# Patient Record
Sex: Female | Born: 1945 | Race: Black or African American | Hispanic: No | Smoking: Never smoker
Health system: Southern US, Community
[De-identification: ages and names within clinical notes are randomized; demographics above are authoritative.]

## PROBLEM LIST (undated history)

## (undated) ENCOUNTER — Emergency Department (HOSPITAL_COMMUNITY): Payer: Medicare Other | Source: Home / Self Care

## (undated) DIAGNOSIS — E213 Hyperparathyroidism, unspecified: Secondary | ICD-10-CM

## (undated) DIAGNOSIS — N390 Urinary tract infection, site not specified: Secondary | ICD-10-CM

## (undated) DIAGNOSIS — F0390 Unspecified dementia without behavioral disturbance: Secondary | ICD-10-CM

## (undated) DIAGNOSIS — E785 Hyperlipidemia, unspecified: Secondary | ICD-10-CM

## (undated) DIAGNOSIS — E119 Type 2 diabetes mellitus without complications: Secondary | ICD-10-CM

## (undated) DIAGNOSIS — R410 Disorientation, unspecified: Secondary | ICD-10-CM

## (undated) DIAGNOSIS — I5032 Chronic diastolic (congestive) heart failure: Secondary | ICD-10-CM

## (undated) DIAGNOSIS — R531 Weakness: Secondary | ICD-10-CM

## (undated) DIAGNOSIS — E440 Moderate protein-calorie malnutrition: Secondary | ICD-10-CM

## (undated) DIAGNOSIS — I10 Essential (primary) hypertension: Secondary | ICD-10-CM

## (undated) DIAGNOSIS — Z741 Need for assistance with personal care: Secondary | ICD-10-CM

## (undated) DIAGNOSIS — I5020 Unspecified systolic (congestive) heart failure: Secondary | ICD-10-CM

## (undated) DIAGNOSIS — G629 Polyneuropathy, unspecified: Secondary | ICD-10-CM

## (undated) HISTORY — DX: Unspecified systolic (congestive) heart failure: I50.20

## (undated) HISTORY — DX: Hypercalcemia: E83.52

## (undated) HISTORY — DX: Hyperlipidemia, unspecified: E78.5

## (undated) HISTORY — DX: Chronic diastolic (congestive) heart failure: I50.32

## (undated) HISTORY — PX: OTHER SURGICAL HISTORY: SHX169

## (undated) HISTORY — DX: Hyperparathyroidism, unspecified: E21.3

## (undated) NOTE — *Deleted (*Deleted)
PROGRESS NOTE    Maria Parrish  LZJ:[id number] DOB: 1946-06-23 DOA: 05/12/2020 PCP: Vidal Schwalbe, MD   Brief Narrative: Maria Parrish is a 18 y.o.  female with medical history significant of difficulty ambulating, history of generalized weakness, confusion, history of dementia, type 2 diabetes, history of hypercalcemia, hyperparathyroidism, hypertension, hyperlipidemia, peripheral neuropathy. Patient presented secondary to a fall and found to have concern for a heart failure exacerbation. Given lasix. Transthoracic Echocardiogram ordered and pending.   Assessment & Plan:   Principal Problem:   Acute on chronic diastolic congestive heart failure (HCC) Active Problems:   Uncontrolled type 2 diabetes mellitus (HCC)   Hypokalemia   Essential hypertension, benign   Mixed hyperlipidemia   Dementia (HCC)   Left hemiparesis (HCC)   Elevated serum creatinine   Acute on chronic diastolic (congestive) heart failure (HCC)   Acute on chronic diastolic heart failure Acute systolic heart failure Last Transthoracic Echocardiogram from 2017. Significantly elevated BNP. Given Lasix 20 mg IV x1. Currently on room air with no current concerns about dyspnea. Transthoracic Echocardiogram significant for reduced EF of 40-45% and grade 2 diastolic dysfunction. Weight is up 2.6 kg from admission. Inaccurate in/out record -Cardiology recommendations: increased to Lasix 40 mg IV BID, recommending nephrology consult -Daily weights (standing) and strict in/out  AKI on CKD stage IIIb Assumed AKI. Last creatinine from 10 months ago was around 1.5. Creatinine of 2.62 on admission presumably in setting of fluid overload. Repeat BMP probably erroneously indicated a creatinine bump up to 8.64 but subsequent creatinine was 2.89. Creatinine trended down slightly but stabilized. Patient appears hypervolemic. Per son, patient's PCP was referring patient to nephrologist -Diuresis as above -Verify patient's baseline  creatinine with PCP (EHR is down today, 11/1)   Diabetes mellitus, type 2 Patient is on Tresiba 22 units daily as an outpatient. Metformin listed but apparently no longer taking.  Patient was not given Lantus on 10/28 for unknown reason -Continue Lantus 22 units daily and SSI  Essential hypertension Uncontrolled -Continue amlodipine, Imdur, hydralazine and metoprolol  Hyperlipidemia -Continue Lipitor and Zetia  Anemia Unknown chronicity. Stable while inpatient. Iron panel unremarkable. -CBC   Neuropathy -Continue gabapentin  Hypokalemia In setting of diuresis. Magnesium repleted. Resolved with repletion.  Hyperparathyroidism Noted on chart. Calcium normal.  Dementia Not fully oriented but appears to be stable.   DVT prophylaxis: Lovenox Code Status:   Code Status: Full Code Family Communication: None at bedside. Disposition Plan: Discharge to home in 1-2 days pending continued diuresis, improvement of weight and cardiology recommendations   Consultants:   Cardiology  Nephrology  Procedures:   TRANSTHORACIC ECHOCARDIOGRAM (10/28) IMPRESSIONS    1. Left ventricular ejection fraction, by estimation, is 40 to 45%. The  left ventricle has mildly decreased function. The left ventricle  demonstrates global hypokinesis. The left ventricular internal cavity size  was mildly dilated. There is mild left  ventricular hypertrophy. Left ventricular diastolic parameters are  consistent with Grade II diastolic dysfunction (pseudonormalization).  Elevated left atrial pressure.  2. Right ventricular systolic function is mildly reduced. The right  ventricular size is normal. There is moderately elevated pulmonary artery  systolic pressure. The estimated right ventricular systolic pressure is  02.4 mmHg.  3. The mitral valve is grossly normal. Mild to moderate mitral valve  regurgitation.  4. The aortic valve is tricuspid. Aortic valve regurgitation is not  visualized.  Mild aortic valve sclerosis is present, with no evidence of  aortic valve stenosis.  5. The inferior vena  cava is dilated in size with >50% respiratory  variability, suggesting right atrial pressure of 8 mmHg.   Antimicrobials:  None    Subjective: No dyspnea or chest pain.  Objective: Vitals:   05/17/20 1600 05/17/20 2206 05/18/20 0445 05/18/20 0600  BP: (!) 142/76 (!) 153/72 (!) 140/56   Pulse: 60 64 66   Resp: 20 18 16    Temp: 98.4 F (36.9 C) 97.8 F (36.6 C) 98.6 F (37 C)   TempSrc:  Oral    SpO2: 100% 99% 100%   Weight:    71.4 kg  Height:        Intake/Output Summary (Last 24 hours) at 05/18/2020 0907 Last data filed at 05/18/2020 0438 Gross per 24 hour  Intake 840 ml  Output 900 ml  Net -60 ml   Filed Weights   05/16/20 1459 05/17/20 0515 05/18/20 0600  Weight: 71.8 kg 72.6 kg 71.4 kg    Examination:  General exam: Appears calm and comfortable Respiratory system: Clear to auscultation. Respiratory effort normal. Cardiovascular system: S1 & S2 heard, RRR. No murmurs, rubs, gallops or clicks. Gastrointestinal system: Abdomen is nondistended, soft and nontender. No organomegaly or masses felt. Normal bowel sounds heard. Central nervous system: Alert and oriented to person and place. No focal neurological deficits. Musculoskeletal: 2+ BLE edema. No calf tenderness Skin: No cyanosis. No rashes Psychiatry: Judgement and insight appear normal. Mood & affect appropriate.    Data Reviewed: I have personally reviewed following labs and imaging studies  CBC Lab Results  Component Value Date   WBC 7.4 05/15/2020   RBC 3.13 (L) 05/15/2020   HGB 9.3 (L) 05/15/2020   HCT 29.0 (L) 05/15/2020   MCV 92.7 05/15/2020   MCH 29.7 05/15/2020   PLT 364 05/15/2020   MCHC 32.1 05/15/2020   RDW 16.1 (H) 05/15/2020   LYMPHSABS 2.0 05/01/2017   MONOABS 0.5 05/01/2017   EOSABS 0.3 05/01/2017   BASOSABS 0.0 [id number]     Last metabolic panel Lab Results   Component Value Date   NA 138 05/18/2020   K 3.8 05/18/2020   CL 106 05/18/2020   CO2 23 05/18/2020   BUN 49 (H) 05/18/2020   CREATININE 3.05 (H) 05/18/2020   GLUCOSE 89 05/18/2020   GFRNONAA 16 (L) 05/18/2020   GFRAA 40 (L) 07/16/2019   CALCIUM 9.2 05/18/2020   PHOS 3.2 05/21/2017   PROT 7.4 05/13/2020   ALBUMIN 3.2 (L) 05/13/2020   BILITOT 0.9 05/13/2020   ALKPHOS 71 05/13/2020   AST 34 05/13/2020   ALT 50 (H) 05/13/2020   ANIONGAP 9 05/18/2020    CBG (last 3)  Recent Labs    05/17/20 1651 05/17/20 2058 05/18/20 0746  GLUCAP 112* 120* 95     GFR: Estimated Creatinine Clearance: 15.9 mL/min (A) (by C-G formula based on SCr of 3.05 mg/dL (H)).  Coagulation Profile: No results for input(s): INR, PROTIME in the last 168 hours.  Recent Results (from the past 240 hour(s))  Respiratory Panel by RT PCR (Flu A&B, Covid) - Nasopharyngeal Swab     Status: None   Collection Time: 05/12/20  7:33 PM   Specimen: Nasopharyngeal Swab  Result Value Ref Range Status   SARS Coronavirus 2 by RT PCR NEGATIVE NEGATIVE Final    Comment: (NOTE) SARS-CoV-2 target nucleic acids are NOT DETECTED.  The SARS-CoV-2 RNA is generally detectable in upper respiratoy specimens during the acute phase of infection. The lowest concentration of SARS-CoV-2 viral copies this assay can detect is 131  copies/mL. A negative result does not preclude SARS-Cov-2 infection and should not be used as the sole basis for treatment or other patient management decisions. A negative result may occur with  improper specimen collection/handling, submission of specimen other than nasopharyngeal swab, presence of viral mutation(s) within the areas targeted by this assay, and inadequate number of viral copies (<131 copies/mL). A negative result must be combined with clinical observations, patient history, and epidemiological information. The expected result is Negative.  Fact Sheet for Patients:   PinkCheek.be  Fact Sheet for Healthcare Providers:  GravelBags.it  This test is no t yet approved or cleared by the [place] FDA and  has been authorized for detection and/or diagnosis of SARS-CoV-2 by FDA under an Emergency Use Authorization (EUA). This EUA will remain  in effect (meaning this test can be used) for the duration of the COVID-19 declaration under Section 564(b)(1) of the Act, 21 U.S.C. section 360bbb-3(b)(1), unless the authorization is terminated or revoked sooner.     Influenza A by PCR NEGATIVE NEGATIVE Final   Influenza B by PCR NEGATIVE NEGATIVE Final    Comment: (NOTE) The Xpert Xpress SARS-CoV-2/FLU/RSV assay is intended as an aid in  the diagnosis of influenza from Nasopharyngeal swab specimens and  should not be used as a sole basis for treatment. Nasal washings and  aspirates are unacceptable for Xpert Xpress SARS-CoV-2/FLU/RSV  testing.  Fact Sheet for Patients: PinkCheek.be  Fact Sheet for Healthcare Providers: GravelBags.it  This test is not yet approved or cleared by the [place] FDA and  has been authorized for detection and/or diagnosis of SARS-CoV-2 by  FDA under an Emergency Use Authorization (EUA). This EUA will remain  in effect (meaning this test can be used) for the duration of the  Covid-19 declaration under Section 564(b)(1) of the Act, 21  U.S.C. section 360bbb-3(b)(1), unless the authorization is  terminated or revoked. Performed at [facility], [address], [place], [place] [address]   Urine culture     Status: Abnormal   Collection Time: 05/12/20  7:57 PM   Specimen: Urine, Random  Result Value Ref Range Status   Specimen Description   Final    URINE, RANDOM Performed at [facility], [address], [place], [place] [address]    Special Requests   Final    NONE Performed at [facility], [address], [place], [place] [address]    Culture MULTIPLE SPECIES PRESENT, SUGGEST RECOLLECTION (A)  Final   Report Status 05/14/2020 FINAL  Final        Radiology Studies: US RENAL  Result Date: 05/17/2020 CLINICAL DATA:  34 year old female with stage 4 chronic kidney disease. EXAM: RENAL / URINARY TRACT ULTRASOUND COMPLETE COMPARISON:  Renal ultrasound 07/21/2015. FINDINGS: Right Kidney: Renal measurements: 10.2 x 4.8 x 5.4 cm = volume: 138 mL. Preserved cortical echogenicity and cortical thickness (image 4). No right hydronephrosis or renal lesion. Left Kidney: Renal measurements: 9.3 x 4.7 x 5.3 cm = volume: 121 mL. Less well visualized than the right kidney but cortical echogenicity and thickness appear preserved on image 24. No left hydronephrosis or renal lesion. Bladder: Appears normal for degree of bladder distention. Both ureteral jets detected with Doppler. No urinary debris is evident. Other: None. IMPRESSION: Negative ultrasound appearance of both kidneys and the urinary bladder. Electronically Signed   By: Genevie Ann M.D.   On: 05/17/2020 13:56        Scheduled Meds: . amLODipine  5 mg Oral Daily  .  aspirin EC  81 mg Oral Daily  . atorvastatin  40 mg Oral Daily  . enoxaparin (LOVENOX) injection  30 mg Subcutaneous Q24H  . ezetimibe  10 mg Oral Daily  . ferrous sulfate  325 mg Oral BID  . furosemide  40 mg Intravenous Daily  . gabapentin  300 mg Oral BID  . hydrALAZINE  25 mg Oral Q6H  . insulin aspart  0-9 Units Subcutaneous TID WC  . insulin glargine  22 Units Subcutaneous Daily  . isosorbide mononitrate  30 mg Oral Daily  . metoprolol succinate  25 mg Oral Daily   Continuous Infusions:   LOS: 5 days     Cordelia Poche, MD [facility] 05/18/2020, 9:07 AM  If 7PM-7AM, please contact night-coverage [email or web address]

---

## 2009-03-27 ENCOUNTER — Emergency Department (HOSPITAL_COMMUNITY): Admission: EM | Admit: 2009-03-27 | Discharge: 2009-03-27 | Payer: Self-pay | Admitting: Emergency Medicine

## 2012-02-29 ENCOUNTER — Other Ambulatory Visit (HOSPITAL_COMMUNITY): Payer: Self-pay | Admitting: *Deleted

## 2012-02-29 ENCOUNTER — Other Ambulatory Visit (HOSPITAL_COMMUNITY): Payer: Self-pay | Admitting: Family Medicine

## 2012-02-29 DIAGNOSIS — Z139 Encounter for screening, unspecified: Secondary | ICD-10-CM

## 2012-03-04 ENCOUNTER — Ambulatory Visit (HOSPITAL_COMMUNITY)
Admission: RE | Admit: 2012-03-04 | Discharge: 2012-03-04 | Disposition: A | Discharge status: Home / Self Care | Payer: Medicare Other | Source: Ambulatory Visit | Attending: Family Medicine | Admitting: Family Medicine

## 2012-03-04 DIAGNOSIS — Z139 Encounter for screening, unspecified: Secondary | ICD-10-CM

## 2012-03-04 DIAGNOSIS — Z1231 Encounter for screening mammogram for malignant neoplasm of breast: Secondary | ICD-10-CM | POA: Insufficient documentation

## 2012-03-05 ENCOUNTER — Ambulatory Visit (HOSPITAL_COMMUNITY): Payer: Self-pay

## 2015-03-25 ENCOUNTER — Emergency Department (HOSPITAL_COMMUNITY): Payer: Medicare HMO

## 2015-03-25 ENCOUNTER — Inpatient Hospital Stay (HOSPITAL_COMMUNITY)
Admission: EM | Admit: 2015-03-25 | Discharge: 2015-03-27 | DRG: 690 | Disposition: A | Discharge status: Home / Self Care | Payer: Medicare HMO | Attending: Internal Medicine | Admitting: Internal Medicine

## 2015-03-25 ENCOUNTER — Encounter (HOSPITAL_COMMUNITY): Payer: Self-pay | Admitting: Emergency Medicine

## 2015-03-25 DIAGNOSIS — E785 Hyperlipidemia, unspecified: Secondary | ICD-10-CM | POA: Diagnosis present

## 2015-03-25 DIAGNOSIS — IMO0002 Reserved for concepts with insufficient information to code with codable children: Secondary | ICD-10-CM | POA: Diagnosis present

## 2015-03-25 DIAGNOSIS — G629 Polyneuropathy, unspecified: Secondary | ICD-10-CM

## 2015-03-25 DIAGNOSIS — Z6821 Body mass index (BMI) 21.0-21.9, adult: Secondary | ICD-10-CM

## 2015-03-25 DIAGNOSIS — I10 Essential (primary) hypertension: Secondary | ICD-10-CM | POA: Diagnosis present

## 2015-03-25 DIAGNOSIS — Z833 Family history of diabetes mellitus: Secondary | ICD-10-CM

## 2015-03-25 DIAGNOSIS — E876 Hypokalemia: Secondary | ICD-10-CM | POA: Diagnosis present

## 2015-03-25 DIAGNOSIS — N390 Urinary tract infection, site not specified: Principal | ICD-10-CM | POA: Diagnosis present

## 2015-03-25 DIAGNOSIS — E1142 Type 2 diabetes mellitus with diabetic polyneuropathy: Secondary | ICD-10-CM | POA: Diagnosis present

## 2015-03-25 DIAGNOSIS — R410 Disorientation, unspecified: Secondary | ICD-10-CM | POA: Diagnosis present

## 2015-03-25 DIAGNOSIS — R4182 Altered mental status, unspecified: Secondary | ICD-10-CM | POA: Insufficient documentation

## 2015-03-25 DIAGNOSIS — E860 Dehydration: Secondary | ICD-10-CM | POA: Diagnosis present

## 2015-03-25 DIAGNOSIS — E440 Moderate protein-calorie malnutrition: Secondary | ICD-10-CM | POA: Insufficient documentation

## 2015-03-25 DIAGNOSIS — E1165 Type 2 diabetes mellitus with hyperglycemia: Secondary | ICD-10-CM | POA: Diagnosis present

## 2015-03-25 DIAGNOSIS — E782 Mixed hyperlipidemia: Secondary | ICD-10-CM | POA: Diagnosis present

## 2015-03-25 HISTORY — DX: Type 2 diabetes mellitus without complications: E11.9

## 2015-03-25 HISTORY — DX: Essential (primary) hypertension: I10

## 2015-03-25 LAB — CBC
HEMATOCRIT: 35.7 % — AB (ref 36.0–46.0)
HEMOGLOBIN: 12.6 g/dL (ref 12.0–15.0)
MCH: 29.4 pg (ref 26.0–34.0)
MCHC: 35.3 g/dL (ref 30.0–36.0)
MCV: 83.4 fL (ref 78.0–100.0)
Platelets: 333 10*3/uL (ref 150–400)
RBC: 4.28 MIL/uL (ref 3.87–5.11)
RDW: 12.7 % (ref 11.5–15.5)
WBC: 8.3 10*3/uL (ref 4.0–10.5)

## 2015-03-25 LAB — COMPREHENSIVE METABOLIC PANEL
ALBUMIN: 4 g/dL (ref 3.5–5.0)
ALT: 13 U/L — ABNORMAL LOW (ref 14–54)
ANION GAP: 9 (ref 5–15)
AST: 19 U/L (ref 15–41)
Alkaline Phosphatase: 61 U/L (ref 38–126)
BUN: 17 mg/dL (ref 6–20)
CALCIUM: 10.2 mg/dL (ref 8.9–10.3)
CO2: 25 mmol/L (ref 22–32)
Chloride: 103 mmol/L (ref 101–111)
Creatinine, Ser: 0.82 mg/dL (ref 0.44–1.00)
GFR calc non Af Amer: >60 mL/min (ref >60)
GLUCOSE: 294 mg/dL — AB (ref 65–99)
POTASSIUM: 3.4 mmol/L — AB (ref 3.5–5.1)
SODIUM: 137 mmol/L (ref 135–145)
Total Bilirubin: 0.6 mg/dL (ref 0.3–1.2)
Total Protein: 8.2 g/dL — ABNORMAL HIGH (ref 6.5–8.1)

## 2015-03-25 LAB — URINE MICROSCOPIC-ADD ON

## 2015-03-25 LAB — URINALYSIS, ROUTINE W REFLEX MICROSCOPIC
BILIRUBIN URINE: NEGATIVE
Glucose, UA: >1000 mg/dL — AB
KETONES UR: 15 mg/dL — AB
LEUKOCYTES UA: NEGATIVE
NITRITE: NEGATIVE
PH: 6 (ref 5.0–8.0)
Protein, ur: 30 mg/dL — AB
Specific Gravity, Urine: 1.025 (ref 1.005–1.030)
UROBILINOGEN UA: 2 mg/dL — AB (ref 0.0–1.0)

## 2015-03-25 LAB — ETHANOL

## 2015-03-25 LAB — CBG MONITORING, ED: GLUCOSE-CAPILLARY: 277 mg/dL — AB (ref 65–99)

## 2015-03-25 MED ORDER — CIPROFLOXACIN IN D5W 400 MG/200ML IV SOLN
400.0000 mg | Freq: Once | INTRAVENOUS | Status: AC
Start: 2015-03-25 — End: 2015-03-26
  Administered 2015-03-25: 400 mg via INTRAVENOUS
  Filled 2015-03-25: qty 200

## 2015-03-25 NOTE — ED Provider Notes (Signed)
CSN: [id number]     Arrival date & time 03/25/15  2002 History   First MD Initiated Contact with Patient 03/25/15 2020     Chief Complaint  Patient presents with  . Leg Pain  . Altered Mental Status    Low 5 caveat due to altered mental status. (Consider location/radiation/quality/duration/timing/severity/associated sxs/prior Treatment) Patient is a 69 y.o. female presenting with leg pain and altered mental status. The history is provided by the patient.  Leg Pain Altered Mental Status  patient has had confusion over the last few months. Has been confused about where she has been. Has been having difficulty driving. Has gotten to wear underwear today. Patient told me she had not seen for it but with further question from the nurse she saw her primary care doctor earlier today for the same thing. Has had some urinary frequency. Has had a unexplained weight loss also over the last several months. No cough. Dull headache at times. No blurred vision. Patient son state that she has been at a store and has to ask them where she was.  Past Medical History  Diagnosis Date  . Diabetes mellitus without complication   . Hypertension    History reviewed. No pertinent past surgical history. History reviewed. No pertinent family history. Social History  Substance Use Topics  . Smoking status: Never Smoker   . Smokeless tobacco: None  . Alcohol Use: No   OB History    No data available     Review of Systems  Unable to perform ROS Constitutional: Positive for unexpected weight change. Negative for chills.  Respiratory: Negative for shortness of breath.   Cardiovascular: Negative for chest pain.      Allergies  Penicillins  Home Medications   Prior to Admission medications   Not on File   BP 146/78 mmHg  Pulse 88  Temp(Src) 97.9 F (36.6 C) (Oral)  Resp 18  Ht 5\' 3"  (1.6 m)  Wt 123 lb (55.792 kg)  BMI 21.79 kg/m2  SpO2 98% Physical Exam  Constitutional: She appears  well-developed.  HENT:  Head: Atraumatic.  Eyes: EOM are normal. Pupils are equal, round, and reactive to light.  Neck: Neck supple.  Cardiovascular: Normal rate and regular rhythm.   Pulmonary/Chest: Effort normal.  Abdominal: Soft. There is no tenderness.  Neurological: She is alert.  Awaken overall appropriate, but mild confusion.  Skin: Skin is warm.  Psychiatric: She has a normal mood and affect.    ED Course  Procedures (including critical care time) Labs Review Labs Reviewed  COMPREHENSIVE METABOLIC PANEL - Abnormal; Notable for the following:    Potassium 3.4 (*)    Glucose, Bld 294 (*)    Total Protein 8.2 (*)    ALT 13 (*)    All other components within normal limits  CBC - Abnormal; Notable for the following:    HCT 35.7 (*)    All other components within normal limits  CBG MONITORING, ED - Abnormal; Notable for the following:    Glucose-Capillary 277 (*)    All other components within normal limits  ETHANOL  URINALYSIS, ROUTINE W REFLEX MICROSCOPIC (NOT AT [facility])    Imaging Review Dg Chest 2 View  03/25/2015   CLINICAL DATA:  Confusion and dizziness.  Hypertension.  EXAM: CHEST  2 VIEW  COMPARISON:  None.  FINDINGS: Lungs are clear. Heart size and pulmonary vascularity are normal. No adenopathy. No bone lesions. There are small cervical ribs bilaterally.  IMPRESSION: No edema or consolidation.  Electronically Signed   By: Lowella Grip III M.D.   On: 03/25/2015 21:21   Ct Head Wo Contrast  03/25/2015   CLINICAL DATA:  Confusion and weakness  EXAM: CT HEAD WITHOUT CONTRAST  TECHNIQUE: Contiguous axial images were obtained from the base of the skull through the vertex without intravenous contrast.  COMPARISON:  03/27/2009  FINDINGS: Moderate age-related atrophy. Mild low attenuation in the deep white matter. Ventricles are proportionate. Atrophy is progressive when compared to the prior study.  No evidence of vascular territory infarct or mass. No hemorrhage or  extra-axial fluid. Calvarium intact.  IMPRESSION: Progressive age-related involutional change.   Electronically Signed   By: Skipper Cliche M.D.   On: 03/25/2015 21:19   I have personally reviewed and evaluated these images and lab results as part of my medical decision-making.   EKG Interpretation   Date/Time:  Thursday March 25 2015 20:48:03 EDT Ventricular Rate:  76 PR Interval:  110 QRS Duration: 77 QT Interval:  391 QTC Calculation: 440 R Axis:   -5 Text Interpretation:  Sinus rhythm Atrial premature complexes Borderline  short PR interval Abnormal R-wave progression, early transition  Nonspecific T abnormalities, lateral leads Confirmed by Alvino Chapel  MD,  Kasyn Rolph [phone]) on 03/25/2015 8:59:41 PM      MDM   Final diagnoses:  Altered mental status, unspecified altered mental status type    Patient gradually worsening of mental status. Lab work overall reassuring but urinalysis pending. Maybe progressive dementia. If urinalysis shows infection may require admission otherwise discharge home.    Davonna Belling, MD 03/25/15 2155

## 2015-03-25 NOTE — ED Notes (Signed)
Patient complaining of pain to lower legs x 2 months. States right lower leg has been bothering her more than left. Patient also states, "my thinking gets a little disoriented at times." Patient's son states that patient has been confused for approximately 2-3 weeks.

## 2015-03-25 NOTE — Discharge Instructions (Signed)
Confusion Confusion is the inability to think with your usual speed or clarity. Confusion may come on quickly or slowly over time. How quickly the confusion comes on depends on the cause. Confusion can be due to any number of causes. CAUSES   Concussion, head injury, or head trauma.  Seizures.  Stroke.  Fever.  Brain tumor.  Age related decreased brain function (dementia).  Heightened emotional states like rage or terror.  Mental illness in which the person loses the ability to determine what is real and what is not (hallucinations).  Infections such as a urinary tract infection (UTI).  Toxic effects from alcohol, drugs, or prescription medicines.  Dehydration and an imbalance of salts in the body (electrolytes).  Lack of sleep.  Low blood sugar (diabetes).  Low levels of oxygen from conditions such as chronic lung disorders.  Drug interactions or other medicine side effects.  Nutritional deficiencies, especially niacin, thiamine, vitamin C, or vitamin B.  Sudden drop in body temperature (hypothermia).  Change in routine, such as when traveling or hospitalized. SIGNS AND SYMPTOMS  People often describe their thinking as cloudy or unclear when they are confused. Confusion can also include feeling disoriented. That means you are unaware of where or who you are. You may also not know what the date or time is. If confused, you may also have difficulty paying attention, remembering, and making decisions. Some people also act aggressively when they are confused.  DIAGNOSIS  The medical evaluation of confusion may include:  Blood and urine tests.  X-rays.  Brain and nervous system tests.  Analyzing your brain waves (electroencephalogram or EEG).  Magnetic resonance imaging (MRI) of your head.  Computed tomography (CT) scan of your head.  Mental status tests in which your health care provider may ask many questions. Some of these questions may seem silly or strange,  but they are a very important test to help diagnose and treat confusion. TREATMENT  An admission to the hospital may not be needed, but a person with confusion should not be left alone. Stay with a family member or friend until the confusion clears. Avoid alcohol, pain relievers, or sedative drugs until you have fully recovered. Do not drive until directed by your health care provider. HOME CARE INSTRUCTIONS  What family and friends can do:  To find out if someone is confused, ask the person to state his or her name, age, and the date. If the person is unsure or answers incorrectly, he or she is confused.  Always introduce yourself, no matter how well the person knows you.  Often remind the person of his or her location.  Place a calendar and clock near the confused person.  Help the person with his or her medicines. You may want to use a pill box, an alarm as a reminder, or give the person each dose as prescribed.  Talk about current events and plans for the day.  Try to keep the environment calm, quiet, and peaceful.  Make sure the person keeps follow-up visits with his or her health care provider. PREVENTION  Ways to prevent confusion:  Avoid alcohol.  Eat a balanced diet.  Get enough sleep.  Take medicine only as directed by your health care provider.  Do not become isolated. Spend time with other people and make plans for your days.  Keep careful watch on your blood sugar levels if you are diabetic. SEEK IMMEDIATE MEDICAL CARE IF:   You develop severe headaches, repeated vomiting, seizures, blackouts, or   slurred speech.  There is increasing confusion, weakness, numbness, restlessness, or personality changes.  You develop a loss of balance, have marked dizziness, feel uncoordinated, or fall.  You have delusions, hallucinations, or develop severe anxiety.  Your family members think you need to be rechecked. Document Released: 08/10/2004 Document Revised: 11/17/2013  Document Reviewed: 08/08/2013 ExitCare Patient Information 2015 [employer]. This information is not intended to replace advice given to you by your health care provider. Make sure you discuss any questions you have with your health care provider.  

## 2015-03-25 NOTE — ED Provider Notes (Signed)
D/w Dr. Conley Canal who will admit UTI with altered MS  Meds given in ED:  Medications  ciprofloxacin (CIPRO) IVPB 400 mg (400 mg Intravenous New Bag/Given 03/25/15 2320)      Noemi Chapel, MD 03/25/15 2326

## 2015-03-26 ENCOUNTER — Encounter (HOSPITAL_COMMUNITY): Payer: Self-pay | Admitting: *Deleted

## 2015-03-26 DIAGNOSIS — Z6821 Body mass index (BMI) 21.0-21.9, adult: Secondary | ICD-10-CM | POA: Diagnosis not present

## 2015-03-26 DIAGNOSIS — R410 Disorientation, unspecified: Secondary | ICD-10-CM | POA: Diagnosis not present

## 2015-03-26 DIAGNOSIS — G629 Polyneuropathy, unspecified: Secondary | ICD-10-CM

## 2015-03-26 DIAGNOSIS — E1165 Type 2 diabetes mellitus with hyperglycemia: Secondary | ICD-10-CM | POA: Diagnosis not present

## 2015-03-26 DIAGNOSIS — N390 Urinary tract infection, site not specified: Secondary | ICD-10-CM | POA: Diagnosis not present

## 2015-03-26 DIAGNOSIS — E876 Hypokalemia: Secondary | ICD-10-CM | POA: Diagnosis present

## 2015-03-26 DIAGNOSIS — R4182 Altered mental status, unspecified: Secondary | ICD-10-CM | POA: Insufficient documentation

## 2015-03-26 DIAGNOSIS — E440 Moderate protein-calorie malnutrition: Secondary | ICD-10-CM | POA: Diagnosis present

## 2015-03-26 DIAGNOSIS — I10 Essential (primary) hypertension: Secondary | ICD-10-CM | POA: Diagnosis present

## 2015-03-26 DIAGNOSIS — E860 Dehydration: Secondary | ICD-10-CM | POA: Diagnosis present

## 2015-03-26 DIAGNOSIS — E785 Hyperlipidemia, unspecified: Secondary | ICD-10-CM | POA: Diagnosis present

## 2015-03-26 DIAGNOSIS — E1142 Type 2 diabetes mellitus with diabetic polyneuropathy: Secondary | ICD-10-CM | POA: Diagnosis present

## 2015-03-26 DIAGNOSIS — Z833 Family history of diabetes mellitus: Secondary | ICD-10-CM | POA: Diagnosis not present

## 2015-03-26 DIAGNOSIS — E782 Mixed hyperlipidemia: Secondary | ICD-10-CM | POA: Diagnosis present

## 2015-03-26 DIAGNOSIS — IMO0002 Reserved for concepts with insufficient information to code with codable children: Secondary | ICD-10-CM | POA: Diagnosis present

## 2015-03-26 LAB — AMMONIA: Ammonia: 14 umol/L (ref 9–35)

## 2015-03-26 LAB — GLUCOSE, CAPILLARY
GLUCOSE-CAPILLARY: 230 mg/dL — AB (ref 65–99)
GLUCOSE-CAPILLARY: 224 mg/dL — AB (ref 65–99)
GLUCOSE-CAPILLARY: 130 mg/dL — AB (ref 65–99)
Glucose-Capillary: 234 mg/dL — ABNORMAL HIGH (ref 65–99)
Glucose-Capillary: 245 mg/dL — ABNORMAL HIGH (ref 65–99)

## 2015-03-26 LAB — VITAMIN B12: VITAMIN B 12: 181 pg/mL (ref 180–914)

## 2015-03-26 LAB — TSH: TSH: 1.613 u[IU]/mL (ref 0.350–4.500)

## 2015-03-26 MED ORDER — LISINOPRIL 10 MG PO TABS
10.0000 mg | ORAL_TABLET | Freq: Every day | ORAL | Status: DC
  Administered 2015-03-26: 10 mg via ORAL
  Filled 2015-03-26: qty 1

## 2015-03-26 MED ORDER — METFORMIN HCL 500 MG PO TABS
500.0000 mg | ORAL_TABLET | Freq: Two times a day (BID) | ORAL | Status: DC
  Administered 2015-03-26 – 2015-03-27 (×2): 500 mg via ORAL
  Filled 2015-03-26 (×3): qty 1

## 2015-03-26 MED ORDER — DEXTROSE 5 % IV SOLN
1.0000 g | INTRAVENOUS | Status: DC
  Administered 2015-03-26 – 2015-03-27 (×2): 1 g via INTRAVENOUS
  Filled 2015-03-26 (×4): qty 10

## 2015-03-26 MED ORDER — INSULIN ASPART 100 UNIT/ML ~~LOC~~ SOLN
0.0000 [IU] | Freq: Every day | SUBCUTANEOUS | Status: DC
  Administered 2015-03-26: 2 [IU] via SUBCUTANEOUS

## 2015-03-26 MED ORDER — POTASSIUM CHLORIDE IN NACL 20-0.9 MEQ/L-% IV SOLN
INTRAVENOUS | Status: DC
  Administered 2015-03-26 – 2015-03-27 (×4): via INTRAVENOUS

## 2015-03-26 MED ORDER — ONDANSETRON HCL 4 MG/2ML IJ SOLN: 4.0000 mg | Freq: Three times a day (TID) | INTRAMUSCULAR | Status: AC | PRN

## 2015-03-26 MED ORDER — ACETAMINOPHEN 325 MG PO TABS: 650.0000 mg | ORAL_TABLET | Freq: Four times a day (QID) | ORAL | Status: DC | PRN

## 2015-03-26 MED ORDER — ATORVASTATIN CALCIUM 10 MG PO TABS
10.0000 mg | ORAL_TABLET | Freq: Every day | ORAL | Status: DC
  Administered 2015-03-26 – 2015-03-27 (×2): 10 mg via ORAL
  Filled 2015-03-26 (×2): qty 1

## 2015-03-26 MED ORDER — ENOXAPARIN SODIUM 40 MG/0.4ML ~~LOC~~ SOLN
40.0000 mg | SUBCUTANEOUS | Status: DC
  Administered 2015-03-26 – 2015-03-27 (×2): 40 mg via SUBCUTANEOUS
  Filled 2015-03-26 (×2): qty 0.4

## 2015-03-26 MED ORDER — ONDANSETRON HCL 4 MG PO TABS: 4.0000 mg | ORAL_TABLET | Freq: Four times a day (QID) | ORAL | Status: DC | PRN

## 2015-03-26 MED ORDER — ACETAMINOPHEN 650 MG RE SUPP: 650.0000 mg | Freq: Four times a day (QID) | RECTAL | Status: DC | PRN

## 2015-03-26 MED ORDER — GLUCERNA SHAKE PO LIQD
237.0000 mL | Freq: Two times a day (BID) | ORAL | Status: DC
  Administered 2015-03-26 – 2015-03-27 (×3): 237 mL via ORAL

## 2015-03-26 MED ORDER — ENSURE ENLIVE PO LIQD: 237.0000 mL | Freq: Two times a day (BID) | ORAL | Status: DC

## 2015-03-26 MED ORDER — INSULIN ASPART 100 UNIT/ML ~~LOC~~ SOLN
0.0000 [IU] | Freq: Three times a day (TID) | SUBCUTANEOUS | Status: DC
  Administered 2015-03-26 – 2015-03-27 (×4): 5 [IU] via SUBCUTANEOUS
  Administered 2015-03-27: 3 [IU] via SUBCUTANEOUS

## 2015-03-26 MED ORDER — INSULIN ASPART 100 UNIT/ML ~~LOC~~ SOLN: 6.0000 [IU] | Freq: Once | SUBCUTANEOUS | Status: DC

## 2015-03-26 MED ORDER — DEXTROSE 5 % IV SOLN
INTRAVENOUS | Status: AC
  Filled 2015-03-26: qty 10

## 2015-03-26 MED ORDER — POTASSIUM CHLORIDE CRYS ER 20 MEQ PO TBCR
40.0000 meq | EXTENDED_RELEASE_TABLET | Freq: Once | ORAL | Status: AC
  Administered 2015-03-26: 40 meq via ORAL
  Filled 2015-03-26: qty 2

## 2015-03-26 MED ORDER — GLIPIZIDE 5 MG PO TABS
5.0000 mg | ORAL_TABLET | Freq: Two times a day (BID) | ORAL | Status: DC
  Administered 2015-03-26 – 2015-03-27 (×2): 5 mg via ORAL
  Filled 2015-03-26 (×2): qty 1

## 2015-03-26 MED ORDER — ONDANSETRON HCL 4 MG/2ML IJ SOLN: 4.0000 mg | Freq: Four times a day (QID) | INTRAMUSCULAR | Status: DC | PRN

## 2015-03-26 MED ORDER — MAGNESIUM HYDROXIDE 400 MG/5ML PO SUSP: 30.0000 mL | Freq: Every day | ORAL | Status: DC | PRN

## 2015-03-26 NOTE — Progress Notes (Signed)
Initial Nutrition Assessment  DOCUMENTATION CODES:  Non-severe (moderate) malnutrition in context of chronic illness  INTERVENTION:  Glucerna Shake po BID, each supplement provides 220 kcal and 10 grams of protein  Offered DM education/referral to outpatient services to better manage DM  NUTRITION DIAGNOSIS:  Unintentional weight loss related to Suspected poor glycemic control as evidenced by pt reporting loss of over 30lbs over the past few months  GOAL:  Patient will meet greater than or equal to 90% of their needs  MONITOR:  PO intake, Supplement acceptance, Labs, Weight trends  REASON FOR ASSESSMENT:  Malnutrition Screening Tool    ASSESSMENT:  69 y.o. Female. PMHX: DM, HLD who presents to ED with confusion for several weeks to several months as well as leg pain. Found to have elevated WBC and BG of 296  Pt has been confused and cannot be sure of accuracy of following information  Pt reports that she has been eating, but has still been losing weight. She does not really eat meals, rather she just snacks throughout the day. She does not follow a DM diet and stated "I really love sweets". She does not take an vitamin/mineral supplements.   Denies any n/v/c/d  She states that she weighed 155 a few months ago. If this is accurate, her wt loss could be related to poor glycemic control. I am unsure of how pt would do with diet education given her confusion. I offered referral to OP resources if she was interested in learning how to control her DM through diet as this could be contributing to her wt loss.   Diet Order:  Diet heart healthy/carb modified Room service appropriate?: Yes; Fluid consistency:: Thin  Skin:  Reviewed, no issues  Last BM:  9/8  Height:  Ht Readings from Last 1 Encounters:  03/26/15 5\' 4"  (1.626 m)   Weight:  Wt Readings from Last 1 Encounters:  03/26/15 127 lb 8 oz (57.834 kg)  Admit weight 123 lbs  Ideal Body Weight:  54.54 kg  BMI:  Body mass  index is 21.87 kg/(m^2).  Estimated Nutritional Needs:  Kcal:  1500-1650 kcals (27-30 kcal/kg) Protein:  56-67 (1-1.2 g/kg) Fluid:  1.5-1.7 liters  EDUCATION NEEDS:  Education needs no appropriate at this time  Burtis Junes RD, LDN Nutrition Pager: [phone] 03/26/2015 1:56 PM

## 2015-03-26 NOTE — Progress Notes (Signed)
Patient admitted by Dr. Conley Canal earlier this morning.  Patient seen and examined.   She has been admitted to the hospital with confusion and urinary tract infection. She is on antibiotics. Follow up urine culture. She is feeling better today. Anticipate discharge home in the next 24-48 hours.  Maria Parrish

## 2015-03-26 NOTE — Progress Notes (Signed)
Inpatient Diabetes Program Recommendations  AACE/ADA: New Consensus Statement on Inpatient Glycemic Control (2013)  Target Ranges:  Prepandial:   less than 140 mg/dL      Peak postprandial:   less than 180 mg/dL (1-2 hours)      Critically ill patients:  140 - 180 mg/dL   Results for MLISS, BUSALACCHI (MRN [id number]) as of 03/26/2015 08:56  Ref. Range 03/25/2015 20:47 03/26/2015 01:51 03/26/2015 07:31  Glucose-Capillary Latest Ref Range: 65-99 mg/dL 277 (H) 230 (H) 234 (H)    Diabetes history: DM2 Outpatient Diabetes medications: Metformin 500 mg BID Current orders for Inpatient glycemic control: Metformin 500 mg BID, Novolog 0-15 units TID with meals, Novolog 0-5 units HS  Inpatient Diabetes Program Recommendations Oral Agents: Note in H&P that "with cognitive issues, may not be the best home insulin candidate." Therefore, may want to consider ordering Tradjenta 5 mg daily and increasing Metformin to 1000 mg BID (if appropriate for patient) while inpatient to determine effect on glycemic control.  Thanks, Barnie Alderman, RN, MSN, CCRN, CDE Diabetes Coordinator Inpatient Diabetes Program [phone] (Team Pager from [place] to [place]) [phone] ([place] office) [phone] [facility] office) [phone] [facility] office)

## 2015-03-26 NOTE — H&P (Signed)
[facility] History and Physical  Maria Parrish [id number] DOB: 1946-05-08 DOA: 03/25/2015  Referring physician: Sabra Parrish PCP: Maria Regal, MD   Chief Complaint:   HPI: Maria Parrish is a 69 y.o. female  With history of hypertension, diabetes and hyperlipidemia who presents to the emergency room with confusion for several weeks to several months as well as leg pain. She describes the pain as "pins and needles" in both legs from the knees down. Reports that sometimes when she is driving, she becomes disoriented. She also reports that she occasionally stumbles. She was brought by son who is currently unavailable. She lives with her son. In the emergency room, workup significant for a blood glucose of 296 and many bacteria on urinalysis with 11-20 white cells. When asked, she does endorse some urinary frequency but denies significant fevers chills night sweats suprapubic or flank pain. Denies vomiting or diarrhea. No change in medications or appetite.`   Review of Systems:  Constitutional:  No weight loss, night sweats, Fevers, chills, fatigue.  HEENT:  No headaches, Difficulty swallowing,Tooth/dental problems,Sore throat,  No sneezing, itching, ear ache, nasal congestion, post nasal drip,  Cardio-vascular:  No chest pain, Orthopnea, PND, swelling in lower extremities, anasarca, dizziness, palpitations  GI:  No heartburn, indigestion, abdominal pain, nausea, vomiting, diarrhea, change in bowel habits, loss of appetite  Resp:  No shortness of breath with exertion or at rest. No excess mucus, no productive cough, No non-productive cough, No coughing up of blood.No change in color of mucus.No wheezing.No chest wall deformity  Skin:  no rash or lesions.  GU:  no dysuria, change in color of urine, no urgency or frequency. No flank pain.  Musculoskeletal:  No joint pain or swelling. No decreased range of motion. No back pain.  Psych:  No change in mood or affect. No depression or  anxiety. No memory loss.   Past Medical History  Diagnosis Date  . Diabetes mellitus without complication   . Hypertension    History reviewed. No pertinent past surgical history. Social History:  reports that she has never smoked. She does not have any smokeless tobacco history on file. She reports that she does not drink alcohol or use illicit drugs.  Allergies  Allergen Reactions  . Penicillins Hives   FH: diabetes  Prior to Admission medications   Medication Sig Start Date End Date Taking? Authorizing Provider  atorvastatin (LIPITOR) 10 MG tablet Take 10 mg by mouth daily. 01/19/15  Yes Historical Provider, MD  lisinopril (PRINIVIL,ZESTRIL) 10 MG tablet Take 10 mg by mouth daily. 01/13/15  Yes Historical Provider, MD  metFORMIN (GLUCOPHAGE) 500 MG tablet Take 500 mg by mouth 2 (two) times daily. 01/13/15  Yes Historical Provider, MD   Physical Exam: Filed Vitals:   03/25/15 2013 03/25/15 2230 03/26/15 0000  BP: 146/78 147/112 181/77  Pulse: 88 72   Temp: 97.9 F (36.6 C)    TempSrc: Oral    Resp: 18 15   Height: 5\' 3"  (1.6 m)    Weight: 55.792 kg (123 lb)    SpO2: 98% 99%     Wt Readings from Last 3 Encounters:  03/25/15 55.792 kg (123 lb)    BP 181/77 mmHg  Pulse 72  Temp(Src) 97.9 F (36.6 C) (Oral)  Resp 15  Ht 5\' 3"  (1.6 m)  Wt 55.792 kg (123 lb)  BMI 21.79 kg/m2  SpO2 99%  General Appearance:    Alert, cooperative, no distress, appears stated age. Oriented.  Head:  Normocephalic, without obvious abnormality, atraumatic  Eyes:    PERRL, conjunctiva/corneas clear, EOM's intact,  Nose:   Nares normal, septum midline, mucosa normal, no drainage    or sinus tenderness  Throat:   Lips, mucosa, and tongue normal; teeth and gums normal  Neck:   Supple, symmetrical, trachea midline, no adenopathy;    thyroid:  no enlargement/tenderness/nodules; no carotid   bruit or JVD  Back:     Symmetric, no curvature, ROM normal, no CVA tenderness  Lungs:     Clear to  auscultation bilaterally, respirations unlabored  Chest Wall:    No tenderness or deformity   Heart:    Regular rate and rhythm, S1 and S2 normal, no murmur, rub   or gallop  Abdomen:     Soft, non-tender, bowel sounds active all four quadrants,    no masses, no organomegaly  Genitalia:    deferred  Rectal:    deferred  Extremities:   Extremities normal, atraumatic, no cyanosis or edema  Pulses:   2+ and symmetric all extremities  Skin:   Skin color, texture, turgor normal, no rashes or lesions  Lymph nodes:   Cervical, supraclavicular, and axillary nodes normal  Neurologic:   CNII-XII intact, normal strength, sensation and reflexes    throughout             Psychiatric: Calm cooperative with normal affect  Labs on Admission:  Basic Metabolic Panel:  Recent Labs Lab 03/25/15 2040  NA 137  K 3.4*  CL 103  CO2 25  GLUCOSE 294*  BUN 17  CREATININE 0.82  CALCIUM 10.2   Liver Function Tests:  Recent Labs Lab 03/25/15 2040  AST 19  ALT 13*  ALKPHOS 61  BILITOT 0.6  PROT 8.2*  ALBUMIN 4.0   No results for input(s): LIPASE, AMYLASE in the last 168 hours. No results for input(s): AMMONIA in the last 168 hours. CBC:  Recent Labs Lab 03/25/15 2040  WBC 8.3  HGB 12.6  HCT 35.7*  MCV 83.4  PLT 333   Cardiac Enzymes: No results for input(s): CKTOTAL, CKMB, CKMBINDEX, TROPONINI in the last 168 hours.  BNP (last 3 results) No results for input(s): BNP in the last 8760 hours.  ProBNP (last 3 results) No results for input(s): PROBNP in the last 8760 hours.  CBG:  Recent Labs Lab 03/25/15 2047  GLUCAP 277*    Radiological Exams on Admission: Dg Chest 2 View  03/25/2015   CLINICAL DATA:  Confusion and dizziness.  Hypertension.  EXAM: CHEST  2 VIEW  COMPARISON:  None.  FINDINGS: Lungs are clear. Heart size and pulmonary vascularity are normal. No adenopathy. No bone lesions. There are small cervical ribs bilaterally.  IMPRESSION: No edema or consolidation.    Electronically Signed   By: Lowella Grip III M.D.   On: 03/25/2015 21:21   Ct Head Wo Contrast  03/25/2015   CLINICAL DATA:  Confusion and weakness  EXAM: CT HEAD WITHOUT CONTRAST  TECHNIQUE: Contiguous axial images were obtained from the base of the skull through the vertex without intravenous contrast.  COMPARISON:  03/27/2009  FINDINGS: Moderate age-related atrophy. Mild low attenuation in the deep white matter. Ventricles are proportionate. Atrophy is progressive when compared to the prior study.  No evidence of vascular territory infarct or mass. No hemorrhage or extra-axial fluid. Calvarium intact.  IMPRESSION: Progressive age-related involutional change.   Electronically Signed   By: Skipper Cliche M.D.   On: 03/25/2015 21:19    EKG:  Sinus rhythm Atrial premature complexes Borderline short PR interval Abnormal R-wave progression, early transition Nonspecific T abnormalities, lateral leads  Assessment/Plan Principal Problem:   UTI (lower urinary tract infection): Rocephin, urine culture Active Problems:   DM (diabetes mellitus), type 2, uncontrolled: Will give sliding scale. Check hemoglobin A1c. With cognitive issues, may not be the best home insulin candidate. Can likely be controlled with orals alone I suspect.   Confusion: Sounds somewhat subacute to chronic. May be worsened by UTI. With neuropathy need to rule out B-12 or folate deficiency. will also check ammonia and TSH. If continues, would advise against driving. Also describes some stumbling when she walks. Will get PT evaluation.   Neuropathy: Check B-12, folate, may be diabetes related.   Hypokalemia: Replete by mouth   Dehydration: IV fluids   Essential hypertension: Continue outpatient medications   Hyperlipidemia: Continue outpatient medications  Code Status: Full code Family Communication: Son has left Disposition Plan: Admit to inpatient. Anticipate a 2 to three-day stay.  Time spent: 60 min  [facility] [email or web address] password [facility]

## 2015-03-26 NOTE — Evaluation (Signed)
Physical Therapy Evaluation Patient Details Name: Maria Parrish MRN: [id number] DOB: 02-04-1946 Today's Date: 03/26/2015   History of Present Illness  With history of hypertension, diabetes and hyperlipidemia who presents to the emergency room with confusion for several weeks to several months as well as leg pain. She describes the pain as "pins and needles" in both legs from the knees down. Reports that sometimes when she is driving, she becomes disoriented. She also reports that she occasionally stumbles. She was brought by son who is currently unavailable. She lives with her son. In the emergency room, workup significant for a blood glucose of 296 and many bacteria on urinalysis with 11-20 white cells. When asked, she does endorse some urinary frequency but denies significant fevers chills night sweats suprapubic or flank pain. Denies vomiting or diarrhea. No change in medications or appetite.` Pt is normally independent in the home and does all of the household activities.  Clinical Impression   Pt was seen for evaluation.  She was alert and oriented, very cooperative.  Her strength is WNL with the exception of mild left quadriceps weakness which is evidenced when she descends steps.  She has a mild dysfunction of her sitting balance and dynamic gait balance but is able to walk functional distances with good stability.  She does not need any assistive devices for gait.  It is possible that her peripheral neuropathy is interfering somewhat in her balance.  I have recommended OP PT for her after d/c.    Follow Up Recommendations Outpatient PT    Equipment Recommendations  None recommended by PT    Recommendations for Other Services    none   Precautions / Restrictions Precautions Precautions: None Restrictions Weight Bearing Restrictions: No      Mobility  Bed Mobility Overal bed mobility: Independent                Transfers Overall transfer level: Independent                  Ambulation/Gait Ambulation/Gait assistance: Modified independent (Device/Increase time) Ambulation Distance (Feet): 200 Feet Assistive device: None Gait Pattern/deviations: WFL(Within Functional Limits) Gait velocity: she is able to walk at a normal speed but initially ambulated with a very slow speed Gait velocity interpretation: <1.8 ft/sec, indicative of risk for recurrent falls General Gait Details: initially upon walking, pt had a very short based gait...she stated that she has been walking this way for about 2 weeks (she doesn't know why).Marland KitchenMarland KitchenI asked her to increase her step length which she did and this resulted in a fairly normal and stable gait pattern  Stairs            Wheelchair Mobility    Modified Rankin (Stroke Patients Only)       Balance Overall balance assessment: Needs assistance Sitting-balance support: No upper extremity supported;Feet supported Sitting balance-Leahy Scale: Fair Sitting balance - Comments: tends to fall backward at times                         Standardized Balance Assessment Standardized Balance Assessment : Dynamic Gait Index   Dynamic Gait Index Level Surface: Normal Change in Gait Speed: Normal Gait with Horizontal Head Turns: Normal Gait with Vertical Head Turns: Normal Gait and Pivot Turn: Normal Step Over Obstacle: Mild Impairment Step Around Obstacles: Mild Impairment Steps: Normal Total Score: 22       Pertinent Vitals/Pain Pain Assessment: No/denies pain    Home Living Family/patient  expects to be discharged to:: Private residence Living Arrangements: Children Available Help at Discharge: Family;Available PRN/intermittently Type of Home: House Home Access: Stairs to enter Entrance Stairs-Rails: Right Entrance Stairs-Number of Steps: 3 Home Layout: One level Home Equipment: Cane - single point      Prior Function Level of Independence: Independent         Comments: pt participates in  all household activities     Hand Dominance        Extremity/Trunk Assessment   Upper Extremity Assessment: Overall WFL for tasks assessed           Lower Extremity Assessment: Overall WFL for tasks assessed (mild weakness of the left quadriceps (4-/5))      Cervical / Trunk Assessment: Normal  Communication   Communication: No difficulties  Cognition Arousal/Alertness: Awake/alert Behavior During Therapy: WFL for tasks assessed/performed Overall Cognitive Status: Within Functional Limits for tasks assessed                      General Comments      Exercises        Assessment/Plan    PT Assessment All further PT needs can be met in the next venue of care  PT Diagnosis Abnormality of gait   PT Problem List Decreased balance  PT Treatment Interventions     PT Goals (Current goals can be found in the Care Plan section) Acute Rehab PT Goals PT Goal Formulation: All assessment and education complete, DC therapy    Frequency     Barriers to discharge        Co-evaluation               End of Session Equipment Utilized During Treatment: Gait belt Activity Tolerance: Patient tolerated treatment well Patient left: in chair;with call bell/phone within reach;with chair alarm set           Time: 3729-0211 PT Time Calculation (min) (ACUTE ONLY): 31 min   Charges:   PT Evaluation $Initial PT Evaluation Tier I: 1 Procedure     PT G CodesDemetrios Isaacs L  PT 03/26/2015, 4:12 PM [phone]

## 2015-03-27 DIAGNOSIS — I10 Essential (primary) hypertension: Secondary | ICD-10-CM

## 2015-03-27 LAB — GLUCOSE, CAPILLARY
Glucose-Capillary: 175 mg/dL — ABNORMAL HIGH (ref 65–99)
Glucose-Capillary: 217 mg/dL — ABNORMAL HIGH (ref 65–99)

## 2015-03-27 LAB — CBC
HCT: 31.4 % — ABNORMAL LOW (ref 36.0–46.0)
Hemoglobin: 11.1 g/dL — ABNORMAL LOW (ref 12.0–15.0)
MCH: 29.8 pg (ref 26.0–34.0)
MCHC: 35.4 g/dL (ref 30.0–36.0)
MCV: 84.4 fL (ref 78.0–100.0)
Platelets: 268 K/uL (ref 150–400)
RBC: 3.72 MIL/uL — ABNORMAL LOW (ref 3.87–5.11)
RDW: 12.8 % (ref 11.5–15.5)
WBC: 6.3 K/uL (ref 4.0–10.5)

## 2015-03-27 LAB — BASIC METABOLIC PANEL WITH GFR
Anion gap: 6 (ref 5–15)
BUN: 11 mg/dL (ref 6–20)
CO2: 24 mmol/L (ref 22–32)
Calcium: 9 mg/dL (ref 8.9–10.3)
Chloride: 107 mmol/L (ref 101–111)
Creatinine, Ser: 0.54 mg/dL (ref 0.44–1.00)
GFR calc Af Amer: >60 mL/min
GFR calc non Af Amer: >60 mL/min
Glucose, Bld: 183 mg/dL — ABNORMAL HIGH (ref 65–99)
Potassium: 3.6 mmol/L (ref 3.5–5.1)
Sodium: 137 mmol/L (ref 135–145)

## 2015-03-27 LAB — HEMOGLOBIN A1C
HEMOGLOBIN A1C: 11.6 % — AB (ref 4.8–5.6)
MEAN PLASMA GLUCOSE: 286 mg/dL

## 2015-03-27 MED ORDER — GLIPIZIDE 5 MG PO TABS: 5.0000 mg | ORAL_TABLET | Freq: Two times a day (BID) | ORAL | Status: DC

## 2015-03-27 MED ORDER — LISINOPRIL 10 MG PO TABS
20.0000 mg | ORAL_TABLET | Freq: Every day | ORAL | Status: DC
  Administered 2015-03-27: 20 mg via ORAL
  Filled 2015-03-27: qty 2

## 2015-03-27 MED ORDER — AMLODIPINE BESYLATE 5 MG PO TABS: 5.0000 mg | ORAL_TABLET | Freq: Every day | ORAL | Status: DC

## 2015-03-27 MED ORDER — AMLODIPINE BESYLATE 5 MG PO TABS
5.0000 mg | ORAL_TABLET | Freq: Every day | ORAL | Status: DC
  Administered 2015-03-27: 5 mg via ORAL
  Filled 2015-03-27: qty 1

## 2015-03-27 MED ORDER — CIPROFLOXACIN HCL 500 MG PO TABS: 500.0000 mg | ORAL_TABLET | Freq: Two times a day (BID) | ORAL | Status: DC

## 2015-03-27 MED ORDER — LISINOPRIL 20 MG PO TABS: 20.0000 mg | ORAL_TABLET | Freq: Every day | ORAL | Status: DC

## 2015-03-27 MED ORDER — METFORMIN HCL 500 MG PO TABS: 500.0000 mg | ORAL_TABLET | Freq: Two times a day (BID) | ORAL | Status: DC

## 2015-03-27 NOTE — Discharge Summary (Signed)
Physician Discharge Summary  Maria Parrish [id number] DOB: 02/26/1946 DOA: 03/25/2015  PCP: Shade Flood, MD  Admit date: 03/25/2015 Discharge date: 03/27/2015  Time spent: 35 minutes  Recommendations for Outpatient Follow-up:  1. Follow up with primary care physician in 1-2 weeks to discuss blood sugar management  Discharge Diagnoses:  Principal Problem:   UTI (lower urinary tract infection) Active Problems:   DM (diabetes mellitus), type 2, uncontrolled   Confusion   Neuropathy   Hypokalemia   Dehydration   Essential hypertension   Hyperlipidemia   Malnutrition of moderate degree   Altered mental status   Discharge Condition: improved  Diet recommendation: low salt, low carb  Filed Weights   03/25/15 2013 03/26/15 0100  Weight: 55.792 kg (123 lb) 57.834 kg (127 lb 8 oz)    History of present illness:  This is a 69 year old female who presented to the emergency room with confusion and leg pain. She reported pins and needles in both legs from the knees down. She is found to have a possible UTI. She was admitted for further treatments.  Hospital Course:  Patient was started on Rocephin in the emergency room. She remained afebrile throughout hospital course. Urine cultures growing gram-negative rods. Final culture will need to be followed up. She was empirically transitioned to oral ciprofloxacin to complete her course. Mental status has improved back to baseline and she is no longer confused.  She was noted to have uncontrolled diabetes. She reports that she was previously taking metformin and glipizide but stopped these medications due to concerns for possible side effects that she saw on television. She was advised to continue these medications for now and follow-up with her primary care physician for further management of diabetes. She reports that she does have a glucometer at home and is wearing out the operated. patient was seen by physical therapy and recommendation  for outpatient therapy. She appears to be back to her baseline level of functioning and will be discharged home in stable condition.  Procedures:    Consultations:    Discharge Exam: Filed Vitals:   03/27/15 1020  BP: 164/90  Pulse:   Temp:   Resp:     General: NAD Cardiovascular: S1, S2 RRR Respiratory: CTA B  Discharge Instructions   Discharge Instructions    Diet - low sodium heart healthy    Complete by:  As directed      Increase activity slowly    Complete by:  As directed           Current Discharge Medication List    START taking these medications   Details  amLODipine (NORVASC) 5 MG tablet Take 1 tablet (5 mg total) by mouth daily. Qty: 30 tablet, Refills: 1    ciprofloxacin (CIPRO) 500 MG tablet Take 1 tablet (500 mg total) by mouth 2 (two) times daily. Qty: 8 tablet, Refills: 0    glipiZIDE (GLUCOTROL) 5 MG tablet Take 1 tablet (5 mg total) by mouth 2 (two) times daily before a meal. Qty: 60 tablet, Refills: 1      CONTINUE these medications which have CHANGED   Details  lisinopril (PRINIVIL,ZESTRIL) 20 MG tablet Take 1 tablet (20 mg total) by mouth daily. Qty: 30 tablet, Refills: 1    metFORMIN (GLUCOPHAGE) 500 MG tablet Take 1 tablet (500 mg total) by mouth 2 (two) times daily. Qty: 60 tablet, Refills: 1      CONTINUE these medications which have NOT CHANGED   Details  atorvastatin (LIPITOR)  10 MG tablet Take 10 mg by mouth daily.       Allergies  Allergen Reactions  . Penicillins Hives   Follow-up Information    Follow up with PETERSON, Linden Dolin, MD. Schedule an appointment as soon as possible for a visit in 2 weeks.   Specialty:  Family Medicine   Contact information:   [address] [place] [address] [phone]        The results of significant diagnostics from this hospitalization (including imaging, microbiology, ancillary and laboratory) are listed below for reference.    Significant Diagnostic Studies: Dg  Chest 2 View  03/25/2015   CLINICAL DATA:  Confusion and dizziness.  Hypertension.  EXAM: CHEST  2 VIEW  COMPARISON:  None.  FINDINGS: Lungs are clear. Heart size and pulmonary vascularity are normal. No adenopathy. No bone lesions. There are small cervical ribs bilaterally.  IMPRESSION: No edema or consolidation.   Electronically Signed   By: Lowella Grip III M.D.   On: 03/25/2015 21:21   Ct Head Wo Contrast  03/25/2015   CLINICAL DATA:  Confusion and weakness  EXAM: CT HEAD WITHOUT CONTRAST  TECHNIQUE: Contiguous axial images were obtained from the base of the skull through the vertex without intravenous contrast.  COMPARISON:  03/27/2009  FINDINGS: Moderate age-related atrophy. Mild low attenuation in the deep white matter. Ventricles are proportionate. Atrophy is progressive when compared to the prior study.  No evidence of vascular territory infarct or mass. No hemorrhage or extra-axial fluid. Calvarium intact.  IMPRESSION: Progressive age-related involutional change.   Electronically Signed   By: Skipper Cliche M.D.   On: 03/25/2015 21:19    Microbiology: No results found for this or any previous visit (from the past 240 hour(s)).   Labs: Basic Metabolic Panel:  Recent Labs Lab 03/25/15 2040 03/27/15 0633  NA 137 137  K 3.4* 3.6  CL 103 107  CO2 25 24  GLUCOSE 294* 183*  BUN 17 11  CREATININE 0.82 0.54  CALCIUM 10.2 9.0   Liver Function Tests:  Recent Labs Lab 03/25/15 2040  AST 19  ALT 13*  ALKPHOS 61  BILITOT 0.6  PROT 8.2*  ALBUMIN 4.0   No results for input(s): LIPASE, AMYLASE in the last 168 hours.  Recent Labs Lab 03/26/15 0030  AMMONIA 14   CBC:  Recent Labs Lab 03/25/15 2040 03/27/15 0633  WBC 8.3 6.3  HGB 12.6 11.1*  HCT 35.7* 31.4*  MCV 83.4 84.4  PLT 333 268   Cardiac Enzymes: No results for input(s): CKTOTAL, CKMB, CKMBINDEX, TROPONINI in the last 168 hours. BNP: BNP (last 3 results) No results for input(s): BNP in the last 8760  hours.  ProBNP (last 3 results) No results for input(s): PROBNP in the last 8760 hours.  CBG:  Recent Labs Lab 03/26/15 1147 03/26/15 1616 03/26/15 2100 03/27/15 0738 03/27/15 1114  GLUCAP 224* 245* 130* 175* 217*       Signed:  Tyrell Seifer  [facility] 03/27/2015, 1:20 PM

## 2015-03-27 NOTE — Progress Notes (Signed)
Sary M Nield to be D/C'd Home per MD order.  Discussed prescriptions and follow up appointments with the patient. Prescriptions given to patient, medication list explained in detail. Pt verbalized understanding.    Medication List    TAKE these medications        amLODipine 5 MG tablet  Commonly known as:  NORVASC  Take 1 tablet (5 mg total) by mouth daily.     atorvastatin 10 MG tablet  Commonly known as:  LIPITOR  Take 10 mg by mouth daily.     ciprofloxacin 500 MG tablet  Commonly known as:  CIPRO  Take 1 tablet (500 mg total) by mouth 2 (two) times daily.     glipiZIDE 5 MG tablet  Commonly known as:  GLUCOTROL  Take 1 tablet (5 mg total) by mouth 2 (two) times daily before a meal.     lisinopril 20 MG tablet  Commonly known as:  PRINIVIL,ZESTRIL  Take 1 tablet (20 mg total) by mouth daily.     metFORMIN 500 MG tablet  Commonly known as:  GLUCOPHAGE  Take 1 tablet (500 mg total) by mouth 2 (two) times daily.        Filed Vitals:   03/27/15 1020  BP: 164/90  Pulse:   Temp:   Resp:     Skin clean, dry and intact without evidence of skin break down, no evidence of skin tears noted. IV catheter discontinued intact. Site without signs and symptoms of complications. Dressing and pressure applied. Pt denies pain at this time. No complaints noted.  An After Visit Summary was printed and given to the patient along with information packets on UTI's.  Patient escorted via [place], and D/C home via private auto.  Retta Mac BSN, RN

## 2015-03-28 LAB — URINE CULTURE: Culture: >=100000

## 2015-03-29 LAB — FOLATE RBC
Folate, Hemolysate: 351.4 ng/mL
Folate, RBC: 979 ng/mL (ref >498)
HEMATOCRIT: 35.9 % (ref 34.0–46.6)

## 2015-07-13 ENCOUNTER — Encounter: Payer: Self-pay | Admitting: Internal Medicine

## 2015-07-13 ENCOUNTER — Other Ambulatory Visit (HOSPITAL_COMMUNITY): Payer: Self-pay | Admitting: Sports Medicine

## 2015-07-13 DIAGNOSIS — R809 Proteinuria, unspecified: Secondary | ICD-10-CM

## 2015-07-21 ENCOUNTER — Ambulatory Visit (HOSPITAL_COMMUNITY)
Admission: RE | Admit: 2015-07-21 | Discharge: 2015-07-21 | Disposition: A | Discharge status: Home / Self Care | Payer: Medicare HMO | Source: Ambulatory Visit | Attending: Sports Medicine | Admitting: Sports Medicine

## 2015-07-21 DIAGNOSIS — R809 Proteinuria, unspecified: Secondary | ICD-10-CM | POA: Insufficient documentation

## 2015-07-28 ENCOUNTER — Encounter: Payer: Self-pay | Admitting: Gastroenterology

## 2015-07-28 ENCOUNTER — Ambulatory Visit (INDEPENDENT_AMBULATORY_CARE_PROVIDER_SITE_OTHER): Payer: Medicare HMO | Admitting: Gastroenterology

## 2015-07-28 ENCOUNTER — Other Ambulatory Visit: Payer: Self-pay

## 2015-07-28 VITALS — BP 131/78 | HR 84 | Temp 97.3°F | Ht 65.0 in | Wt 122.2 lb

## 2015-07-28 DIAGNOSIS — R634 Abnormal weight loss: Secondary | ICD-10-CM

## 2015-07-28 MED ORDER — PEG 3350-KCL-NA BICARB-NACL 420 G PO SOLR: 4000.0000 mL | Freq: Once | ORAL | Status: DC

## 2015-07-28 NOTE — Progress Notes (Signed)
Primary Care Physician:  Shade Flood, MD  Primary Gastroenterologist:  Garfield Cornea, MD   Chief Complaint  Patient presents with  . Weight Loss    HPI:  Maria Parrish is a 70 y.o. female here for further evaluation of abnormal weight loss in setting of hypercalcemia. According to records we received, patient weight 122/123lb dating back to 04/2015. 127 in 03/2015. She reports weight in the 150 lb range last summer. Son confirms. She reports good appetite. No difficulty swallowing. No heartburn, vomiting, abdominal pain. BM regular. No melena, brbpr. ?remote colonoscopy.   Wt Readings from Last 3 Encounters:  07/28/15 122 lb 3.2 oz (55.43 kg)  03/26/15 127 lb 8 oz (57.834 kg)   She has appointment with Dr. Dorris Fetch on 08/10/15 for hypercalcemia/hyperparathyroidism.  Patient did not tolerate metformin 1000mg  BID due to nausea, abdominal cramps, diarrhea.    Patient was inpatient in 03/2015 at [facility] for UTI, dehydration, confusion. Pertinent labs outlined below.     Current Outpatient Prescriptions  Medication Sig Dispense Refill  . amLODipine (NORVASC) 5 MG tablet Take 1 tablet (5 mg total) by mouth daily. 30 tablet 1  . glipiZIDE (GLUCOTROL) 5 MG tablet Take 1 tablet (5 mg total) by mouth 2 (two) times daily before a meal. 60 tablet 1  . lisinopril (PRINIVIL,ZESTRIL) 20 MG tablet Take 1 tablet (20 mg total) by mouth daily. 30 tablet 1  . metFORMIN (GLUCOPHAGE) 500 MG tablet Take 1 tablet (500 mg total) by mouth 2 (two) times daily. 60 tablet 1   No current facility-administered medications for this visit.    Allergies as of 07/28/2015 - Review Complete 07/28/2015  Allergen Reaction Noted  . Penicillins Hives 03/25/2015    Past Medical History  Diagnosis Date  . Diabetes mellitus without complication ([place])   . Hypertension   . Hyperlipidemia   . Hypercalcemia   . Hyperparathyroidism [facility])     Past Surgical History  Procedure Laterality Date  . None      Family History   Problem Relation Age of Onset  . Diabetes Father   . Heart attack Mother   . Colon cancer Neg Hx     Social History   Social History  . Marital Status: Widowed    Spouse Name: N/A  . Number of Children: 2  . 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 Smoking status: Never Smoker   . Smokeless tobacco: Not on file  . Alcohol Use: No  . Drug Use: No  . Sexual Activity: Not Currently   Other Topics Concern  . Not on file   Social History Narrative      ROS:  General: Negative for anorexia,  fever, chills, fatigue, weakness. See hpi. Eyes: Negative for vision changes.  ENT: Negative for hoarseness, difficulty swallowing , nasal congestion. CV: Negative for chest pain, angina, palpitations, dyspnea on exertion, peripheral edema.  Respiratory: Negative for dyspnea at rest, dyspnea on exertion, cough, sputum, wheezing.  GI: See history of present illness. GU:  Negative for dysuria, hematuria, urinary incontinence, urinary frequency, nocturnal urination.  MS: Negative for joint pain, low back pain.  Derm: Negative for rash or itching.  Neuro: Negative for weakness, abnormal sensation, seizure, frequent headaches, memory loss, confusion.  Psych: Negative for anxiety, depression, suicidal ideation, hallucinations.  Endo: see hpi Heme: Negative for bruising or bleeding. Allergy: Negative for rash or hives.    Physical Examination:  BP 131/78 mmHg  Pulse 84  Temp(Src) 97.3 F (36.3 C) (Oral)  Ht 5\' 5"  (1.651 m)  Wt 122 lb 3.2 oz (55.43 kg)  BMI 20.34 kg/m2   General: Well-nourished, well-developed in no acute distress. Accompanied by son. Head: Normocephalic, atraumatic.   Eyes: Conjunctiva pink, no icterus. Mouth: Oropharyngeal mucosa moist and pink , no lesions erythema or exudate. Neck: Supple without thyromegaly, masses, or lymphadenopathy.  Lungs: Clear to auscultation bilaterally.  Heart: Regular  rate and rhythm, no murmurs rubs or gallops.  Abdomen: Bowel sounds are normal, nontender, nondistended, no hepatosplenomegaly or masses, no abdominal bruits or    hernia , no rebound or guarding.   Rectal: not performed Extremities: No lower extremity edema. No clubbing or deformities.  Neuro: Alert and oriented x 4 , grossly normal neurologically.  Skin: Warm and dry, no rash or jaundice.   Psych: Alert and cooperative, normal mood and affect.  Labs: Labs from October 2016 Glucose 175, BUN 15, creatinine 0.83, calcium 10.6, total bilirubin 0.3, alkaline phosphatase 72, AST 11, ALT 9, albumin 4.4  Calcium 9.0 in 03/2015 H/H 11.1/31.4 in 03/2015 TSH 1.613 in 03/2015  Lab Results  Component Value Date   VITAMINB12 181 03/26/2015   Lab Results  Component Value Date   HGBA1C 11.6* 03/26/2015   No results found for: FOLATE No results found for: IRON, TIBC, FERRITIN  Lab Results  Component Value Date   CREATININE 0.54 03/27/2015   BUN 11 03/27/2015   NA 137 03/27/2015   K 3.6 03/27/2015   CL 107 03/27/2015   CO2 24 03/27/2015   Lab Results  Component Value Date   ALT 13* 03/25/2015   AST 19 03/25/2015   ALKPHOS 61 03/25/2015   BILITOT 0.6 03/25/2015   Lab Results  Component Value Date   WBC 6.3 03/27/2015   HGB 11.1* 03/27/2015   HCT 31.4* 03/27/2015   MCV 84.4 03/27/2015   PLT 268 03/27/2015     Imaging Studies: US Renal  07/21/2015  CLINICAL DATA:  Proteinuria, history of diabetes and hypertension EXAM: RENAL / URINARY TRACT ULTRASOUND COMPLETE COMPARISON:  None in PACs FINDINGS: Right Kidney: Length: 10.9 cm. The renal cortical echotexture is approximately equal to that of the adjacent liver. No mass or hydronephrosis visualized. Left Kidney: Length: 10.5 cm. Echogenicity similar to that on the right. No mass or hydronephrosis visualized. Bladder: The urinary bladder is only partially distended. A right ureteral jet was demonstrated. No left ureteral jet was observed.  Incidental note is made of a lobulated heterogeneous echotexture uterus. IMPRESSION: 1. Mildly increased renal cortical echotexture may reflect medical renal disease. There is no significant cortical atrophy. There is no hydronephrosis. 2. Incidental note is made of a lobulated heterogeneous echotexture uterus. Pelvic ultrasound is recommended. Electronically Signed   By: David  Martinique M.D.   On: 07/21/2015 11:49

## 2015-07-28 NOTE — Progress Notes (Signed)
cc'ed to pcp °

## 2015-07-28 NOTE — Patient Instructions (Signed)
Colonoscopy with possible upper endoscopy with Dr. Gala Romney. See separate instructions.

## 2015-07-28 NOTE — Assessment & Plan Note (Signed)
70 y/o female who reports 25 pound weight loss since summer 2016, in the absence of any GI symptoms. She has had poorly controlled diabetes, recent mild elevation of her calcium and mild anemia. No recent colonoscopy. Discussed at length with patient and patient's son. We need to consider colonoscopy +/- EGD to further evaluate mild anemia, weight loss exclude PUD/malignancy.  I have discussed the risks, alternatives, benefits with regards to but not limited to the risk of reaction to medication, bleeding, infection, perforation and the patient is agreeable to proceed. Written consent to be obtained.

## 2015-08-10 ENCOUNTER — Ambulatory Visit (INDEPENDENT_AMBULATORY_CARE_PROVIDER_SITE_OTHER): Payer: Medicare HMO | Admitting: "Endocrinology

## 2015-08-10 ENCOUNTER — Encounter: Payer: Self-pay | Admitting: "Endocrinology

## 2015-08-10 ENCOUNTER — Ambulatory Visit: Payer: Self-pay | Admitting: "Endocrinology

## 2015-08-10 ENCOUNTER — Other Ambulatory Visit: Payer: Self-pay

## 2015-08-10 DIAGNOSIS — E1165 Type 2 diabetes mellitus with hyperglycemia: Secondary | ICD-10-CM

## 2015-08-10 DIAGNOSIS — I10 Essential (primary) hypertension: Secondary | ICD-10-CM | POA: Diagnosis not present

## 2015-08-10 DIAGNOSIS — IMO0001 Reserved for inherently not codable concepts without codable children: Secondary | ICD-10-CM

## 2015-08-10 DIAGNOSIS — E049 Nontoxic goiter, unspecified: Secondary | ICD-10-CM

## 2015-08-10 LAB — BASIC METABOLIC PANEL
BUN: 16 mg/dL (ref 7–25)
CHLORIDE: 103 mmol/L (ref 98–110)
CO2: 25 mmol/L (ref 20–31)
CREATININE: 0.67 mg/dL (ref 0.50–0.99)
Calcium: 9.9 mg/dL (ref 8.6–10.4)
Glucose, Bld: 190 mg/dL — ABNORMAL HIGH (ref 65–99)
Potassium: 3.9 mmol/L (ref 3.5–5.3)
Sodium: 138 mmol/L (ref 135–146)

## 2015-08-10 LAB — PHOSPHORUS: Phosphorus: 3.5 mg/dL (ref 2.1–4.3)

## 2015-08-10 LAB — MAGNESIUM: MAGNESIUM: 1.6 mg/dL (ref 1.5–2.5)

## 2015-08-10 LAB — T4, FREE: Free T4: 0.99 ng/dL (ref 0.80–1.80)

## 2015-08-10 LAB — TSH: TSH: 1.547 u[IU]/mL (ref 0.350–4.500)

## 2015-08-10 NOTE — Progress Notes (Signed)
Subjective:    Patient ID: Maria Parrish, female    DOB: [birth year], PCP Maria Flood, MD   Past Medical History  Diagnosis Date  . Diabetes mellitus without complication ([place])   . Hypertension   . Hyperlipidemia   . Hypercalcemia   . Hyperparathyroidism [facility])    Past Surgical History  Procedure Laterality Date  . None     Social History   Social History  . Marital Status: Widowed    Spouse Name: N/A  . Number of Children: 2  . 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 Smoking status: Never Smoker   . Smokeless tobacco: None  . Alcohol Use: No  . Drug Use: No  . Sexual Activity: Not Currently   Other Topics Concern  . None   Social History Narrative   Outpatient Encounter Prescriptions as of 08/10/2015  Medication Sig  . glipiZIDE (GLUCOTROL) 10 MG tablet Take 10 mg by mouth 2 (two) times daily before a meal.  . lisinopril (PRINIVIL,ZESTRIL) 20 MG tablet Take 1 tablet (20 mg total) by mouth daily.  . metFORMIN (GLUCOPHAGE) 500 MG tablet Take 1 tablet (500 mg total) by mouth 2 (two) times daily.  . [DISCONTINUED] amLODipine (NORVASC) 5 MG tablet Take 1 tablet (5 mg total) by mouth daily. (Patient not taking: Reported on 08/03/2015)  . [DISCONTINUED] glipiZIDE (GLUCOTROL) 5 MG tablet Take 1 tablet (5 mg total) by mouth 2 (two) times daily before a meal.  . [DISCONTINUED] polyethylene glycol-electrolytes (NULYTELY/GOLYTELY) 420 g solution Take 4,000 mLs by mouth once.   No facility-administered encounter medications on file as of 08/10/2015.   ALLERGIES: Allergies  Allergen Reactions  . Penicillins Other (See Comments)    Makes patient sore.  Has patient had a PCN reaction causing immediate rash, facial/tongue/throat swelling, SOB or lightheadedness with hypotension: No Has patient had a PCN reaction causing severe rash involving mucus membranes or skin necrosis: No Has patient had a PCN reaction  that required hospitalization No Has patient had a PCN reaction occurring within the last 10 years: No If all of the above answers are "NO", then may proceed with Cephalosporin use.   . Statins Other (See Comments)    Makes wrists and fingers ache   VACCINATION STATUS:  There is no immunization history on file for this patient.  HPI 70 year old female patient with medical history as above. She is being seen in consultation for history of hyperparathyroidism with recent elevation of PTH associated with hypercalcemia requested by her primary medical doctor Maria Parrish, M.D. On further interview patient denies any history of prior parathyroid dysfunction nor family history of parathyroid, pituitary, or adrenal dysfunctions. Patient is not on any calcium supplements. Her intake of dairy products is average. Her calcium was found to be elevated at 10.6 during her last visit in 04/29/2015 with her primary medical doctor. Patient has recent history of unexplained weight loss being worked up with imaging studies including planned colonoscopy. Patient denies any neck swelling. No history of diagnosed cancer. She has severely uncontrolled diabetes type 2 with A1c of 11.6% from 03/26/2015. Is on glipizide 10 mg by mouth daily and metformin 500 mg by mouth twice a day.   Review of Systems Constitutional: + weight loss, +fatigue, no subjective hyperthermia/hypothermia Eyes: no blurry vision, no xerophthalmia ENT: no sore throat, no nodules palpated in throat, no dysphagia/odynophagia, no hoarseness Cardiovascular: no CP/SOB/palpitations/leg swelling Respiratory:  no cough/SOB Gastrointestinal: no N/V/D/C Musculoskeletal: no muscle/joint aches Skin: no rashes Neurological: no tremors/numbness/tingling,  +dizziness Psychiatric: no depression/anxiety   Objective:    BP 161/90 mmHg  Pulse 76  Ht 5\' 6"  (1.676 m)  Wt 123 lb (55.792 kg)  BMI 19.86 kg/m2  SpO2 95%  Wt Readings from Last 3  Encounters:  08/10/15 123 lb (55.792 kg)  07/28/15 122 lb 3.2 oz (55.43 kg)  03/26/15 127 lb 8 oz (57.834 kg)    Physical Exam Constitutional: Light built, in NAD, she uses a cane to walk around. Eyes: PERRLA, EOMI, no exophthalmos ENT: moist mucous membranes. Neck: She has prominent sternocleidomastoid joints bilaterally, she has mild thyromegaly,  No cervical lymphadenopathy Cardiovascular: RRR, No MRG Respiratory: CTA B Gastrointestinal: abdomen soft, NT, ND, BS+ Musculoskeletal: no deformities, strength intact in all 4 Skin: moist, warm, no rashes Neurological: no tremor with outstretched hands, DTR normal in all 4  CMP ( most recent) CMP     Component Value Date/Time   NA 137 03/27/2015 0633   K 3.6 03/27/2015 0633   CL 107 03/27/2015 0633   CO2 24 03/27/2015 0633   GLUCOSE 183* 03/27/2015 0633   BUN 11 03/27/2015 0633   CREATININE 0.54 03/27/2015 0633   CALCIUM 9.0 03/27/2015 0633   PROT 8.2* 03/25/2015 2040   ALBUMIN 4.0 03/25/2015 2040   AST 19 03/25/2015 2040   ALT 13* 03/25/2015 2040   ALKPHOS 61 03/25/2015 2040   BILITOT 0.6 03/25/2015 2040   GFRNONAA >60 03/27/2015 0633   GFRAA >60 03/27/2015 [id number]     Diabetic Labs (most recent): Lab Results  Component Value Date   HGBA1C 11.6* 03/26/2015   From 04/29/2015 lab work showed calcium 10.6. PTH was not reported to review today.    Assessment & Plan:   1. Hypercalcemia -Etiology unclear for now. Early mild primary hyper pallor versus secondary hyperparathyroidism possibility. -She will need more confirmatory workup with the following: - PTH, intact and calcium, ,magnesium, Phosphorus - Calcium, urine, 24 hour;  Basic metabolic panel  -I will also obtain free T4 and TSH along with 25 hydroxy vitamin D. -Given her clinical goiter I will request for dedicated thyroid ultrasound. -Malignancy related hypercalcemia is usually severe and typically above 12 mg/dL. Her recent weight loss is concerning and I  agree with imaging studies to rule out occult malignancy in this particular patient.  2. Uncontrolled type 2 diabetes mellitus without complication, without long-term current use of insulin ([place]) -Given her recent high A1c of 11.6% from September, she may need insulin therapy. However, I will repeat her A1c and will approach her with the new plan if necessary.  3. Essential hypertension -Uncontrolled I advised her to continue lisinopril 20 mg by mouth every morning for now. we will reevaluate for additional therapy next visit.  4. Goiter -I will proceed to obtain dedicated thyroid ultrasound.   -Patient is undergoing preparation for colonoscopy, this workup will resume once she is a done with colonoscopy.  - I advised patient to maintain close follow up with PETERSON, Linden Dolin, MD for primary care needs. Follow up plan: Return in about 4 weeks (around 09/07/2015) for labs today, and a jar for 24 hour urine calcium.  Glade Lloyd, MD Phone: [phone]  Fax: [phone]   08/10/2015, 6:46 PM

## 2015-08-11 LAB — VITAMIN D 25 HYDROXY (VIT D DEFICIENCY, FRACTURES): Vit D, 25-Hydroxy: 13 ng/mL — ABNORMAL LOW (ref 30–100)

## 2015-08-11 LAB — PTH, INTACT AND CALCIUM
Calcium: 9.9 mg/dL (ref 8.4–10.5)
PTH: 78 pg/mL — ABNORMAL HIGH (ref 14–64)

## 2015-08-11 LAB — HEMOGLOBIN A1C
Hgb A1c MFr Bld: 7.9 % — ABNORMAL HIGH (ref <5.7)
MEAN PLASMA GLUCOSE: 180 mg/dL — AB (ref <117)

## 2015-08-12 ENCOUNTER — Ambulatory Visit (HOSPITAL_COMMUNITY)
Admission: RE | Admit: 2015-08-12 | Discharge: 2015-08-12 | Disposition: A | Discharge status: Home / Self Care | Payer: Medicare HMO | Source: Ambulatory Visit | Attending: Internal Medicine | Admitting: Internal Medicine

## 2015-08-12 ENCOUNTER — Encounter (HOSPITAL_COMMUNITY): Payer: Self-pay | Admitting: *Deleted

## 2015-08-12 ENCOUNTER — Encounter (HOSPITAL_COMMUNITY)
Admission: RE | Disposition: A | Discharge status: Home / Self Care | Payer: Self-pay | Source: Ambulatory Visit | Attending: Internal Medicine

## 2015-08-12 DIAGNOSIS — K2950 Unspecified chronic gastritis without bleeding: Secondary | ICD-10-CM | POA: Diagnosis not present

## 2015-08-12 DIAGNOSIS — R634 Abnormal weight loss: Secondary | ICD-10-CM | POA: Diagnosis not present

## 2015-08-12 DIAGNOSIS — K3189 Other diseases of stomach and duodenum: Secondary | ICD-10-CM | POA: Insufficient documentation

## 2015-08-12 DIAGNOSIS — E213 Hyperparathyroidism, unspecified: Secondary | ICD-10-CM | POA: Diagnosis not present

## 2015-08-12 DIAGNOSIS — Z7984 Long term (current) use of oral hypoglycemic drugs: Secondary | ICD-10-CM | POA: Insufficient documentation

## 2015-08-12 DIAGNOSIS — Z79899 Other long term (current) drug therapy: Secondary | ICD-10-CM | POA: Insufficient documentation

## 2015-08-12 DIAGNOSIS — I10 Essential (primary) hypertension: Secondary | ICD-10-CM | POA: Insufficient documentation

## 2015-08-12 DIAGNOSIS — Z1211 Encounter for screening for malignant neoplasm of colon: Secondary | ICD-10-CM | POA: Insufficient documentation

## 2015-08-12 DIAGNOSIS — E119 Type 2 diabetes mellitus without complications: Secondary | ICD-10-CM | POA: Insufficient documentation

## 2015-08-12 DIAGNOSIS — K5730 Diverticulosis of large intestine without perforation or abscess without bleeding: Secondary | ICD-10-CM | POA: Insufficient documentation

## 2015-08-12 DIAGNOSIS — Z6820 Body mass index (BMI) 20.0-20.9, adult: Secondary | ICD-10-CM | POA: Insufficient documentation

## 2015-08-12 DIAGNOSIS — Z880 Allergy status to penicillin: Secondary | ICD-10-CM | POA: Diagnosis not present

## 2015-08-12 DIAGNOSIS — E785 Hyperlipidemia, unspecified: Secondary | ICD-10-CM | POA: Insufficient documentation

## 2015-08-12 DIAGNOSIS — K317 Polyp of stomach and duodenum: Secondary | ICD-10-CM | POA: Insufficient documentation

## 2015-08-12 HISTORY — PX: ESOPHAGOGASTRODUODENOSCOPY: SHX5428

## 2015-08-12 HISTORY — PX: COLONOSCOPY: SHX5424

## 2015-08-12 LAB — GLUCOSE, CAPILLARY: GLUCOSE-CAPILLARY: 175 mg/dL — AB (ref 65–99)

## 2015-08-12 SURGERY — COLONOSCOPY
Anesthesia: Moderate Sedation

## 2015-08-12 MED ORDER — LIDOCAINE VISCOUS 2 % MT SOLN
OROMUCOSAL | Status: DC | PRN
  Administered 2015-08-12: 3 mL via OROMUCOSAL

## 2015-08-12 MED ORDER — MEPERIDINE HCL 100 MG/ML IJ SOLN
INTRAMUSCULAR | Status: AC
  Filled 2015-08-12: qty 2

## 2015-08-12 MED ORDER — LIDOCAINE VISCOUS 2 % MT SOLN
OROMUCOSAL | Status: AC
  Filled 2015-08-12: qty 15

## 2015-08-12 MED ORDER — MIDAZOLAM HCL 5 MG/5ML IJ SOLN
INTRAMUSCULAR | Status: AC
  Filled 2015-08-12: qty 10

## 2015-08-12 MED ORDER — ONDANSETRON HCL 4 MG/2ML IJ SOLN
INTRAMUSCULAR | Status: AC
  Filled 2015-08-12: qty 2

## 2015-08-12 MED ORDER — SODIUM CHLORIDE 0.9 % IV SOLN
INTRAVENOUS | Status: DC
  Administered 2015-08-12: 1000 mL via INTRAVENOUS

## 2015-08-12 MED ORDER — MEPERIDINE HCL 100 MG/ML IJ SOLN
INTRAMUSCULAR | Status: DC | PRN
  Administered 2015-08-12 (×2): 25 mg via INTRAVENOUS

## 2015-08-12 MED ORDER — STERILE WATER FOR IRRIGATION IR SOLN
Status: DC | PRN
  Administered 2015-08-12: 13:00:00

## 2015-08-12 MED ORDER — ONDANSETRON HCL 4 MG/2ML IJ SOLN
INTRAMUSCULAR | Status: DC | PRN
  Administered 2015-08-12: 4 mg via INTRAVENOUS

## 2015-08-12 MED ORDER — MIDAZOLAM HCL 5 MG/5ML IJ SOLN
INTRAMUSCULAR | Status: DC | PRN
  Administered 2015-08-12: 2 mg via INTRAVENOUS
  Administered 2015-08-12 (×2): 1 mg via INTRAVENOUS

## 2015-08-12 NOTE — Interval H&P Note (Signed)
History and Physical Interval Note:  08/12/2015 12:38 PM  Maria Parrish  has presented today for surgery, with the diagnosis of weight loss  The various methods of treatment have been discussed with the patient and family. After consideration of risks, benefits and other options for treatment, the patient has consented to  Procedure(s) with comments: COLONOSCOPY (N/A) - 1245pm ESOPHAGOGASTRODUODENOSCOPY (EGD) (N/A) as a surgical intervention .  The patient's history has been reviewed, patient examined, no change in status, stable for surgery.  I have reviewed the patient's chart and labs.  Questions were answered to the patient's satisfaction.     Fidela Cieslak   No change. Colonoscopy possible EGD per plan.  The risks, benefits, limitations, imponderables and alternatives regarding both EGD and colonoscopy have been reviewed with the patient. Questions have been answered. All parties agreeable.

## 2015-08-12 NOTE — Op Note (Signed)
[facility] [address] [place], [address]   ENDOSCOPY PROCEDURE REPORT  PATIENT: Nickol, Roseman  MR#: [id number] BIRTHDATE: 04-14-1946 , 26  yrs. old GENDER: female ENDOSCOPIST: R.  Garfield Cornea, MD FACP [facility] REFERRED BY:  [facility] PROCEDURE DATE:  Sep 04, 2015 PROCEDURE:  EGD w/ biopsy INDICATIONS:  weight loss; hypercalcemia. MEDICATIONS: Versed 4 mg IV and Demerol 50 mg IV in divided doses. His Xylocaine gel orally.  Zofran 4 mg IV. ASA CLASS:      Class II  CONSENT: The risks, benefits, limitations, alternatives and imponderables have been discussed.  The potential for biopsy, esophogeal dilation, etc. have also been reviewed.  Questions have been answered.  All parties agreeable.  Please see the history and physical in the medical record for more information.  DESCRIPTION OF PROCEDURE: After the risks benefits and alternatives of the procedure were thoroughly explained, informed consent was obtained.  The [id number] endoscope was introduced through the mouth and advanced to the second portion of the duodenum , limited by Without limitations. The instrument was slowly withdrawn as the mucosa was fully examined. Estimated blood loss is zero unless otherwise noted in this procedure report.    Normal-appearing tubular esophagus.  Stomach empty.  Small hiatal hernia.  Diffuse inflammatory changes - redness patchy ,erythema of the the gastric mucosa with multiple 1-3 mm benign-appearing polyps.  No obvious ulcer or infiltrating process.  Patent pylorus. Normal-appearing first and second portion of the duodenum.  Biopsies the abnormal gastric mucosa and one of the gastric polyps taken separately.  Retroflexed views revealed as previously described.     The scope was then withdrawn from the patient and the procedure completed.  COMPLICATIONS: There were no immediate complications.  ENDOSCOPIC IMPRESSION: Small hiatal hernia.  Abnormal gastric mucosa. Gastric polyps. Status post biopsy  RECOMMENDATIONS: Follow up on pathology. See colonoscopy report.  REPEAT EXAM:  eSigned:  R. Garfield Cornea, MD Rosalita Chessman [facility] Sep 04, 2015 1:21 PM    CC:  CPT CODES: ICD CODES:  The ICD and CPT codes recommended by this software are interpretations from the data that the clinical staff has captured with the software.  The verification of the translation of this report to the ICD and CPT codes and modifiers is the sole responsibility of the health care institution and practicing physician where this report was generated.  [place]. will not be held responsible for the validity of the ICD and CPT codes included on this report.  AMA assumes no liability for data contained or not contained herein. CPT is a Designer, television/film set of the [employer].  PATIENT NAME:  Emmaley, Faucette MR#: [id number]

## 2015-08-12 NOTE — Discharge Instructions (Signed)
°Colonoscopy °Discharge Instructions ° °Read the instructions outlined below and refer to this sheet in the next few weeks. These discharge instructions provide you with general information on caring for yourself after you leave the hospital. Your doctor may also give you specific instructions. While your treatment has been planned according to the most current medical practices available, unavoidable complications occasionally occur. If you have any problems or questions after discharge, call Dr. Rourk at [phone]. °ACTIVITY °· You may resume your regular activity, but move at a slower pace for the next 24 hours.  °· Take frequent rest periods for the next 24 hours.  °· Walking will help get rid of the air and reduce the bloated feeling in your belly (abdomen).  °· No driving for 24 hours (because of the medicine (anesthesia) used during the test).   °· Do not sign any important legal documents or operate any machinery for 24 hours (because of the anesthesia used during the test).  °NUTRITION °· Drink plenty of fluids.  °· You may resume your normal diet as instructed by your doctor.  °· Begin with a light meal and progress to your normal diet. Heavy or fried foods are harder to digest and may make you feel sick to your stomach (nauseated).  °· Avoid alcoholic beverages for 24 hours or as instructed.  °MEDICATIONS °· You may resume your normal medications unless your doctor tells you otherwise.  °WHAT YOU CAN EXPECT TODAY °· Some feelings of bloating in the abdomen.  °· Passage of more gas than usual.  °· Spotting of blood in your stool or on the toilet paper.  °IF YOU HAD POLYPS REMOVED DURING THE COLONOSCOPY: °· No aspirin products for 7 days or as instructed.  °· No alcohol for 7 days or as instructed.  °· Eat a soft diet for the next 24 hours.  °FINDING OUT THE RESULTS OF YOUR TEST °Not all test results are available during your visit. If your test results are not back during the visit, make an appointment  with your caregiver to find out the results. Do not assume everything is normal if you have not heard from your caregiver or the medical facility. It is important for you to follow up on all of your test results.  °SEEK IMMEDIATE MEDICAL ATTENTION IF: °· You have more than a spotting of blood in your stool.  °· Your belly is swollen (abdominal distention).  °· You are nauseated or vomiting.  °· You have a temperature over 101.  °· You have abdominal pain or discomfort that is severe or gets worse throughout the day.  ° ° ° °Diverticulosis information provided ° °Further recommendations to follow pending review of pathology report ° ° °Diverticulosis °Diverticulosis is the condition that develops when small pouches (diverticula) form in the wall of your colon. Your colon, or large intestine, is where water is absorbed and stool is formed. The pouches form when the inside layer of your colon pushes through weak spots in the outer layers of your colon. °CAUSES  °No one knows exactly what causes diverticulosis. °RISK FACTORS °Being older than 50. Your risk for this condition increases with age. Diverticulosis is rare in people younger than 40 years. By age 80, almost everyone has it. °Eating a low-fiber diet. °Being frequently constipated. °Being overweight. °Not getting enough exercise. °Smoking. °Taking over-the-counter pain medicines, like aspirin and ibuprofen. °SYMPTOMS  °Most people with diverticulosis do not have symptoms. °DIAGNOSIS  °Because diverticulosis often has no symptoms, health care   providers often discover the condition during an exam for other colon problems. In many cases, a health care provider will diagnose diverticulosis while using a flexible scope to examine the colon (colonoscopy). °TREATMENT  °If you have never developed an infection related to diverticulosis, you may not need treatment. If you have had an infection before, treatment may include: °Eating more fruits, vegetables, and  grains. °Taking a fiber supplement. °Taking a live bacteria supplement (probiotic). °Taking medicine to relax your colon. °HOME CARE INSTRUCTIONS  °Drink at least 6-8 glasses of water each day to prevent constipation. °Try not to strain when you have a bowel movement. °Keep all follow-up appointments. °If you have had an infection before:  °Increase the fiber in your diet as directed by your health care provider or dietitian. °Take a dietary fiber supplement if your health care provider approves. °Only take medicines as directed by your health care provider. °SEEK MEDICAL CARE IF:  °You have abdominal pain. °You have bloating. °You have cramps. °You have not gone to the bathroom in 3 days. °SEEK IMMEDIATE MEDICAL CARE IF:  °Your pain gets worse. °Your bloating becomes very bad. °You have a fever or chills, and your symptoms suddenly get worse. °You begin vomiting. °You have bowel movements that are bloody or black. °MAKE SURE YOU: °Understand these instructions. °Will watch your condition. °Will get help right away if you are not doing well or get worse. °  °This information is not intended to replace advice given to you by your health care provider. Make sure you discuss any questions you have with your health care provider. °  °Document Released: 03/30/2004 Document Revised: 07/08/2013 Document Reviewed: 05/28/2013 °Elsevier Interactive Patient Education ©2016 [employer] ° °

## 2015-08-12 NOTE — H&P (View-Only) (Signed)
Primary Care Physician:  Shade Flood, MD  Primary Gastroenterologist:  Garfield Cornea, MD   Chief Complaint  Patient presents with  . Weight Loss    HPI:  Maria Parrish is a 70 y.o. female here for further evaluation of abnormal weight loss in setting of hypercalcemia. According to records we received, patient weight 122/123lb dating back to 04/2015. 127 in 03/2015. She reports weight in the 150 lb range last summer. Son confirms. She reports good appetite. No difficulty swallowing. No heartburn, vomiting, abdominal pain. BM regular. No melena, brbpr. ?remote colonoscopy.   Wt Readings from Last 3 Encounters:  07/28/15 122 lb 3.2 oz (55.43 kg)  03/26/15 127 lb 8 oz (57.834 kg)   She has appointment with Dr. Dorris Fetch on 08/10/15 for hypercalcemia/hyperparathyroidism.  Patient did not tolerate metformin 1000mg  BID due to nausea, abdominal cramps, diarrhea.    Patient was inpatient in 03/2015 at [facility] for UTI, dehydration, confusion. Pertinent labs outlined below.     Current Outpatient Prescriptions  Medication Sig Dispense Refill  . amLODipine (NORVASC) 5 MG tablet Take 1 tablet (5 mg total) by mouth daily. 30 tablet 1  . glipiZIDE (GLUCOTROL) 5 MG tablet Take 1 tablet (5 mg total) by mouth 2 (two) times daily before a meal. 60 tablet 1  . lisinopril (PRINIVIL,ZESTRIL) 20 MG tablet Take 1 tablet (20 mg total) by mouth daily. 30 tablet 1  . metFORMIN (GLUCOPHAGE) 500 MG tablet Take 1 tablet (500 mg total) by mouth 2 (two) times daily. 60 tablet 1   No current facility-administered medications for this visit.    Allergies as of 07/28/2015 - Review Complete 07/28/2015  Allergen Reaction Noted  . Penicillins Hives 03/25/2015    Past Medical History  Diagnosis Date  . Diabetes mellitus without complication ([place])   . Hypertension   . Hyperlipidemia   . Hypercalcemia   . Hyperparathyroidism [facility])     Past Surgical History  Procedure Laterality Date  . None      Family History   Problem Relation Age of Onset  . Diabetes Father   . Heart attack Mother   . Colon cancer Neg Hx     Social History   Social History  . Marital Status: Widowed    Spouse Name: N/A  . Number of Children: 2  . 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 Smoking status: Never Smoker   . Smokeless tobacco: Not on file  . Alcohol Use: No  . Drug Use: No  . Sexual Activity: Not Currently   Other Topics Concern  . Not on file   Social History Narrative      ROS:  General: Negative for anorexia,  fever, chills, fatigue, weakness. See hpi. Eyes: Negative for vision changes.  ENT: Negative for hoarseness, difficulty swallowing , nasal congestion. CV: Negative for chest pain, angina, palpitations, dyspnea on exertion, peripheral edema.  Respiratory: Negative for dyspnea at rest, dyspnea on exertion, cough, sputum, wheezing.  GI: See history of present illness. GU:  Negative for dysuria, hematuria, urinary incontinence, urinary frequency, nocturnal urination.  MS: Negative for joint pain, low back pain.  Derm: Negative for rash or itching.  Neuro: Negative for weakness, abnormal sensation, seizure, frequent headaches, memory loss, confusion.  Psych: Negative for anxiety, depression, suicidal ideation, hallucinations.  Endo: see hpi Heme: Negative for bruising or bleeding. Allergy: Negative for rash or hives.    Physical Examination:  BP 131/78 mmHg  Pulse 84  Temp(Src) 97.3 F (36.3 C) (Oral)  Ht 5\' 5"  (1.651 m)  Wt 122 lb 3.2 oz (55.43 kg)  BMI 20.34 kg/m2   General: Well-nourished, well-developed in no acute distress. Accompanied by son. Head: Normocephalic, atraumatic.   Eyes: Conjunctiva pink, no icterus. Mouth: Oropharyngeal mucosa moist and pink , no lesions erythema or exudate. Neck: Supple without thyromegaly, masses, or lymphadenopathy.  Lungs: Clear to auscultation bilaterally.  Heart: Regular  rate and rhythm, no murmurs rubs or gallops.  Abdomen: Bowel sounds are normal, nontender, nondistended, no hepatosplenomegaly or masses, no abdominal bruits or    hernia , no rebound or guarding.   Rectal: not performed Extremities: No lower extremity edema. No clubbing or deformities.  Neuro: Alert and oriented x 4 , grossly normal neurologically.  Skin: Warm and dry, no rash or jaundice.   Psych: Alert and cooperative, normal mood and affect.  Labs: Labs from October 2016 Glucose 175, BUN 15, creatinine 0.83, calcium 10.6, total bilirubin 0.3, alkaline phosphatase 72, AST 11, ALT 9, albumin 4.4  Calcium 9.0 in 03/2015 H/H 11.1/31.4 in 03/2015 TSH 1.613 in 03/2015  Lab Results  Component Value Date   VITAMINB12 181 03/26/2015   Lab Results  Component Value Date   HGBA1C 11.6* 03/26/2015   No results found for: FOLATE No results found for: IRON, TIBC, FERRITIN  Lab Results  Component Value Date   CREATININE 0.54 03/27/2015   BUN 11 03/27/2015   NA 137 03/27/2015   K 3.6 03/27/2015   CL 107 03/27/2015   CO2 24 03/27/2015   Lab Results  Component Value Date   ALT 13* 03/25/2015   AST 19 03/25/2015   ALKPHOS 61 03/25/2015   BILITOT 0.6 03/25/2015   Lab Results  Component Value Date   WBC 6.3 03/27/2015   HGB 11.1* 03/27/2015   HCT 31.4* 03/27/2015   MCV 84.4 03/27/2015   PLT 268 03/27/2015     Imaging Studies: US Renal  07/21/2015  CLINICAL DATA:  Proteinuria, history of diabetes and hypertension EXAM: RENAL / URINARY TRACT ULTRASOUND COMPLETE COMPARISON:  None in PACs FINDINGS: Right Kidney: Length: 10.9 cm. The renal cortical echotexture is approximately equal to that of the adjacent liver. No mass or hydronephrosis visualized. Left Kidney: Length: 10.5 cm. Echogenicity similar to that on the right. No mass or hydronephrosis visualized. Bladder: The urinary bladder is only partially distended. A right ureteral jet was demonstrated. No left ureteral jet was observed.  Incidental note is made of a lobulated heterogeneous echotexture uterus. IMPRESSION: 1. Mildly increased renal cortical echotexture may reflect medical renal disease. There is no significant cortical atrophy. There is no hydronephrosis. 2. Incidental note is made of a lobulated heterogeneous echotexture uterus. Pelvic ultrasound is recommended. Electronically Signed   By: David  Martinique M.D.   On: 07/21/2015 11:49

## 2015-08-12 NOTE — Op Note (Signed)
[facility] [address] [place], [address]   COLONOSCOPY PROCEDURE REPORT  PATIENT: Neira, Butson  MR#: [id number] BIRTHDATE: 1945/08/28 , 62  yrs. old GENDER: female ENDOSCOPIST: R.  Garfield Cornea, MD FACP [facility] REFERRED [id number] [facility] PROCEDURE DATE:  08-19-2015 PROCEDURE:   Colonoscopy, screening INDICATIONS:Screening; weight loss; hypercalcemia. MEDICATIONS: Versed 3 mg IV and Demerol 50 mg IV in divided doses. Zofran 4 mg IV. ASA CLASS:       Class II  CONSENT: The risks, benefits, alternatives and imponderables including but not limited to bleeding, perforation as well as the possibility of a missed lesion have been reviewed.  The potential for biopsy, lesion removal, etc. have also been discussed. Questions have been answered.  All parties agreeable.  Please see the history and physical in the medical record for more information.  DESCRIPTION OF PROCEDURE:   After the risks benefits and alternatives of the procedure were thoroughly explained, informed consent was obtained.  The digital rectal exam revealed no abnormalities of the rectum.   The [id number]  endoscope was introduced through the anus and advanced to the cecum, which was identified by both the appendix and ileocecal valve. No adverse events experienced.   The quality of the prep was adequate  The instrument was then slowly withdrawn as the colon was fully examined. Estimated blood loss is zero unless otherwise noted in this procedure report.    Normal-appearing rectal mucosa. Unable retroflex. Rectal vault small. Rectal mucosa seen well on?"face. Scattered left-sided diverticula; the remainder of the colonic mucosa appeared normal.       Retroflexion was not performed. .  Withdrawal time=11 minutes 0 seconds.  The scope was withdrawn and the procedure completed. COMPLICATIONS: There were no immediate complications.  ENDOSCOPIC IMPRESSION: Colonic  diverticulosis.  RECOMMENDATIONS: See EGD report  eSigned:  R. Garfield Cornea, MD Rosalita Chessman [facility] Aug 19, 2015 1:10 PM   cc:  CPT CODES: ICD CODES:  The ICD and CPT codes recommended by this software are interpretations from the data that the clinical staff has captured with the software.  The verification of the translation of this report to the ICD and CPT codes and modifiers is the sole responsibility of the health care institution and practicing physician where this report was generated.  [employer]. will not be held responsible for the validity of the ICD and CPT codes included on this report.  AMA assumes no liability for data contained or not contained herein. CPT is a Designer, television/film set of the [employer].  PATIENT NAME:  Ikeria, Losier MR#: [id number]

## 2015-08-13 ENCOUNTER — Encounter: Payer: Self-pay | Admitting: Internal Medicine

## 2015-08-16 ENCOUNTER — Encounter (HOSPITAL_COMMUNITY): Payer: Self-pay | Admitting: Internal Medicine

## 2015-08-17 LAB — CALCIUM, URINE, 24 HOUR
CALCIUM 24HR UR: 128 mg/(24.h) (ref 35–250)
CALCIUM UR: 16 mg/dL

## 2015-09-02 ENCOUNTER — Ambulatory Visit (HOSPITAL_COMMUNITY)
Admission: RE | Admit: 2015-09-02 | Discharge: 2015-09-02 | Disposition: A | Discharge status: Home / Self Care | Payer: Medicare HMO | Source: Ambulatory Visit | Attending: "Endocrinology | Admitting: "Endocrinology

## 2015-09-02 DIAGNOSIS — E049 Nontoxic goiter, unspecified: Secondary | ICD-10-CM | POA: Insufficient documentation

## 2015-09-06 ENCOUNTER — Other Ambulatory Visit: Payer: Self-pay | Admitting: Neurology

## 2015-09-06 DIAGNOSIS — R413 Other amnesia: Secondary | ICD-10-CM

## 2015-09-09 ENCOUNTER — Ambulatory Visit (INDEPENDENT_AMBULATORY_CARE_PROVIDER_SITE_OTHER): Payer: Medicare HMO | Admitting: "Endocrinology

## 2015-09-09 ENCOUNTER — Encounter: Payer: Self-pay | Admitting: "Endocrinology

## 2015-09-09 VITALS — BP 178/80 | HR 74 | Ht 65.0 in | Wt 124.0 lb

## 2015-09-09 DIAGNOSIS — E349 Endocrine disorder, unspecified: Secondary | ICD-10-CM

## 2015-09-09 DIAGNOSIS — E559 Vitamin D deficiency, unspecified: Secondary | ICD-10-CM | POA: Diagnosis not present

## 2015-09-09 DIAGNOSIS — IMO0001 Reserved for inherently not codable concepts without codable children: Secondary | ICD-10-CM

## 2015-09-09 DIAGNOSIS — E049 Nontoxic goiter, unspecified: Secondary | ICD-10-CM | POA: Diagnosis not present

## 2015-09-09 DIAGNOSIS — E1165 Type 2 diabetes mellitus with hyperglycemia: Secondary | ICD-10-CM

## 2015-09-09 MED ORDER — GLIPIZIDE 10 MG PO TABS: 10.0000 mg | ORAL_TABLET | Freq: Every day | ORAL | Status: DC

## 2015-09-09 MED ORDER — VITAMIN D (ERGOCALCIFEROL) 1.25 MG (50000 UNIT) PO CAPS: 50000.0000 [IU] | ORAL_CAPSULE | ORAL | Status: DC

## 2015-09-09 NOTE — Progress Notes (Signed)
Subjective:    Patient ID: Maria Parrish, female    DOB: [birth year], PCP Shade Flood, MD   Past Medical History  Diagnosis Date  . Diabetes mellitus without complication ([place])   . Hypertension   . Hyperlipidemia   . Hypercalcemia   . Hyperparathyroidism [facility])    Past Surgical History  Procedure Laterality Date  . None    . Colonoscopy N/A 08/12/2015    Procedure: COLONOSCOPY;  Surgeon: Daneil Dolin, MD;  Location: [facility] ENDO [facility]ITE;  Service: Endoscopy;  Laterality: N/A;  1245pm  . Esophagogastroduodenoscopy N/A 08/12/2015    Procedure: ESOPHAGOGASTRODUODENOSCOPY (EGD);  Surgeon: Daneil Dolin, MD;  Location: [facility] ENDO SUITE;  Service: Endoscopy;  Laterality: N/A;   Social History   Social History  . Marital Status: Widowed    Spouse Name: N/A  . Number of Children: 2  . 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 Smoking status: Never Smoker   . Smokeless tobacco: None  . Alcohol Use: No  . Drug Use: No  . Sexual Activity: Not Currently   Other Topics Concern  . None   Social History Narrative   Outpatient Encounter Prescriptions as of 09/09/2015  Medication Sig  . glipiZIDE (GLUCOTROL) 10 MG tablet Take 1 tablet (10 mg total) by mouth daily before breakfast.  . lisinopril (PRINIVIL,ZESTRIL) 20 MG tablet Take 1 tablet (20 mg total) by mouth daily.  . metFORMIN (GLUCOPHAGE) 500 MG tablet Take 1 tablet (500 mg total) by mouth 2 (two) times daily.  . polyethylene glycol-electrolytes (NULYTELY/GOLYTELY) 420 g solution Take 4,000 mLs by mouth once.  . Vitamin D, Ergocalciferol, (DRISDOL) 50000 units CAPS capsule Take 1 capsule (50,000 Units total) by mouth every 7 (seven) days.  . [DISCONTINUED] glipiZIDE (GLUCOTROL) 10 MG tablet Take 10 mg by mouth 2 (two) times daily before a meal.   No facility-administered encounter medications on file as of 09/09/2015.   ALLERGIES: Allergies  Allergen Reactions  .  Penicillins Other (See Comments)    Makes patient sore.  Has patient had a PCN reaction causing immediate rash, facial/tongue/throat swelling, SOB or lightheadedness with hypotension: No Has patient had a PCN reaction causing severe rash involving mucus membranes or skin necrosis: No Has patient had a PCN reaction that required hospitalization No Has patient had a PCN reaction occurring within the last 10 years: No If all of the above answers are "NO", then may proceed with Cephalosporin use.   . Statins Other (See Comments)    Makes wrists and fingers ache   VACCINATION STATUS:  There is no immunization history on file for this patient.  HPI 70 year old female patient with medical history as above. She is here to follow-up after being seen for history of hyperparathyroidism with recent elevation of PTH associated with mild hypercalcemia. -She did not repeat labs including 24-hour urine calcium determination which did not confirm primary hyperparathyroidism however significant vitamin D deficiency causing secondary hyperparathyroidism.  On further interview patient denies any history of prior parathyroid dysfunction nor family history of parathyroid, pituitary, or adrenal dysfunctions. Patient is not on any calcium supplements. Her intake of dairy products is average.  Her repeat lab shows calcium 9.9 along with PTH of 78, her prior calcium was found to be elevated at 10.6 during her last visit in 04/29/2015 with her primary medical doctor. Patient has recent history of unexplained weight loss being worked up  with imaging studies including planned colonoscopy.   - Her thyroid ultrasound shows mild goiter with known discrete nodules. - No history of diagnosed cancer. - Her A1c is better at 7.9% improving from 11.6%. She remains on glipizide 10 mg by mouth twice a day and metformin 500 mg by mouth twice a day.    Review of Systems Constitutional: + weight loss, +fatigue, no subjective  hyperthermia/hypothermia Eyes: no blurry vision, no xerophthalmia ENT: no sore throat, no nodules palpated in throat, no dysphagia/odynophagia, no hoarseness Cardiovascular: no CP/SOB/palpitations/leg swelling Respiratory: no cough/SOB Gastrointestinal: no N/V/D/C Musculoskeletal: no muscle/joint aches Skin: no rashes Neurological: no tremors/numbness/tingling,  +dizziness Psychiatric: no depression/anxiety   Objective:    BP 178/80 mmHg  Pulse 74  Ht 5\' 5"  (1.651 m)  Wt 124 lb (56.246 kg)  BMI 20.63 kg/m2  SpO2 99%  Wt Readings from Last 3 Encounters:  09/09/15 124 lb (56.246 kg)  08/12/15 123 lb (55.792 kg)  08/10/15 123 lb (55.792 kg)    Physical Exam Constitutional: Light built, in NAD, she uses a cane to walk around. Eyes: PERRLA, EOMI, no exophthalmos ENT: moist mucous membranes. Neck: She has prominent sternocleidomastoid joints bilaterally, she has mild thyromegaly,  No cervical lymphadenopathy Cardiovascular: RRR, No MRG Respiratory: CTA B Gastrointestinal: abdomen soft, NT, ND, BS+ Musculoskeletal: no deformities, strength intact in all 4 Skin: moist, warm, no rashes Neurological: no tremor with outstretched hands, DTR normal in all 4  Recent Results (from the past 2160 hour(s))  VITAMIN D 25 Hydroxy (Vit-D Deficiency, Fractures)     Status: Abnormal   Collection Time: 08/10/15 10:07 AM  Result Value Ref Range   Vit D, 25-Hydroxy 13 (L) 30 - 100 ng/mL    Comment: Vitamin D Status           25-OH Vitamin D        Deficiency                <20 ng/mL        Insufficiency         20 - 29 ng/mL        Optimal             > or = 30 ng/mL   For 25-OH Vitamin D testing on patients on D2-supplementation and patients for whom quantitation of D2 and D3 fractions is required, the QuestAssureD 25-OH VIT D, (D2,D3), LC/MS/MS is recommended: order code [phone] (patients > 2 yrs).   PTH, intact and calcium     Status: Abnormal   Collection Time: 08/10/15 10:38 AM  Result  Value Ref Range   PTH 78 (H) 14 - 64 pg/mL   Calcium 9.9 8.4 - 10.5 mg/dL    Comment:   Interpretive Guide:                              Intact PTH               Calcium                              ----------               ------- Normal Parathyroid           Normal                   Normal Hypoparathyroidism  Low or Low Normal        Low Hyperparathyroidism      Primary                 Normal or High           High      Secondary               High                     Normal or Low      Tertiary                High                     High Non-Parathyroid   Hypercalcemia              Low or Low Normal        High   Magnesium     Status: None   Collection Time: 08/10/15 10:38 AM  Result Value Ref Range   Magnesium 1.6 1.5 - 2.5 mg/dL  Phosphorus     Status: None   Collection Time: 08/10/15 10:38 AM  Result Value Ref Range   Phosphorus 3.5 2.1 - 4.3 mg/dL  Basic metabolic panel     Status: Abnormal   Collection Time: 08/10/15 10:38 AM  Result Value Ref Range   Sodium 138 135 - 146 mmol/L   Potassium 3.9 3.5 - 5.3 mmol/L   Chloride 103 98 - 110 mmol/L   CO2 25 20 - 31 mmol/L   Glucose, Bld 190 (H) 65 - 99 mg/dL   BUN 16 7 - 25 mg/dL   Creat 0.67 0.50 - 0.99 mg/dL   Calcium 9.9 8.6 - 10.4 mg/dL  Hemoglobin A1c     Status: Abnormal   Collection Time: 08/10/15 10:38 AM  Result Value Ref Range   Hgb A1c MFr Bld 7.9 (H) <5.7 %    Comment:                                                                        According to the ADA Clinical Practice Recommendations for 2011, when HbA1c is used as a screening test:     >=6.5%   Diagnostic of Diabetes Mellitus            (if abnormal result is confirmed)   5.7-6.4%   Increased risk of developing Diabetes Mellitus   References:Diagnosis and Classification of Diabetes Mellitus,Diabetes [id number] 1):S62-S69 and Standards of Medical Care in         Diabetes - 2011,Diabetes Care,2011,34 (Suppl 1):S11-S61.       Mean Plasma Glucose 180 (H) <117 mg/dL  T4, free     Status: None   Collection Time: 08/10/15 10:38 AM  Result Value Ref Range   Free T4 0.99 0.80 - 1.80 ng/dL  TSH     Status: None   Collection Time: 08/10/15 10:38 AM  Result Value Ref Range   TSH 1.547 0.350 - 4.500 uIU/mL  Glucose, capillary     Status: Abnormal   Collection Time: 08/12/15 12:21 PM  Result Value Ref Range   Glucose-Capillary 175 (H) 65 -  99 mg/dL  Calcium, urine, 24 hour     Status: None   Collection Time: 08/16/15  3:33 PM  Result Value Ref Range   Calcium, Ur 16 Not estab mg/dL   Calcium, 24 hour urine 128 35 - 250 mg/24 h    Comment:                                   Reference Range   35-250                                   Low calcium diet  35-200       Diabetic Labs (most recent): Lab Results  Component Value Date   HGBA1C 7.9* 08/10/2015   HGBA1C 11.6* 03/26/2015   From 04/29/2015 lab work showed calcium 10.6. PTH was not reported to review today.    Assessment & Plan:   1. Hypercalcemia: Resolved - Like a driving factor is vitamin D deficiency causing secondary hyperparathyroidism. Her labs did not confirm primary hyperparathyroidism for now. I will proceed to treat with high-dose vitamin D for the next 4 months and repeat PTH and calcium again.  Etiology unclear for now. Early mild primary hyper pallor versus secondary hyperparathyroidism possibility. -She will need more confirmatory workup with the following: - PTH, intact and calcium, ,magnesium, Phosphorus - Calcium, urine, 24 hour;  Basic metabolic panel  -I will also obtain free T4 and TSH along with 25 hydroxy vitamin D.   -Malignancy related hypercalcemia is usually severe and typically above 12 mg/dL. Her recent weight loss is concerning and I agree with imaging studies to rule out occult malignancy in this particular patient.   2. Uncontrolled type 2 diabetes mellitus without complication, without long-term current use of insulin  ([place]) - Her A1c is better at 7.9% compared to  of 11.6% from September. -She would not need insulin therapy for now. -I advised her to continue metformin 500 mg by mouth twice a day and lower Glucotrol to 10 mg by mouth daily.   3. Essential hypertension -Uncontrolled I advised her to continue lisinopril 20 mg by mouth every morning for now. we will reevaluate for additional therapy next visit.  4. Vitamin D deficiency: See #1 above. I will initiate vitamin D 50,000 units weekly for the next 16 weeks.  5. Goiter -Her thyroid ultrasound showed mild goiter with no discrete nodules. No intervention needed at this point.    - I advised patient to maintain close follow up with PETERSON, Linden Dolin, MD for primary care needs. Follow up plan: Return in about 4 months (around 01/07/2016) for diabetes, high blood pressure, Vitamin D deficiency, follow up with pre-visit labs.  Glade Lloyd, MD Phone: [phone]  Fax: [phone]   09/09/2015, 3:07 PM

## 2015-09-14 ENCOUNTER — Inpatient Hospital Stay (HOSPITAL_COMMUNITY)
Admission: EM | Admit: 2015-09-14 | Discharge: 2015-09-20 | DRG: 640 | Disposition: A | Discharge status: Home Health | Payer: Medicare HMO | Attending: Family Medicine | Admitting: Family Medicine

## 2015-09-14 ENCOUNTER — Encounter (HOSPITAL_COMMUNITY): Payer: Self-pay

## 2015-09-14 ENCOUNTER — Emergency Department (HOSPITAL_COMMUNITY): Payer: Medicare HMO

## 2015-09-14 DIAGNOSIS — W19XXXA Unspecified fall, initial encounter: Secondary | ICD-10-CM | POA: Diagnosis not present

## 2015-09-14 DIAGNOSIS — Z515 Encounter for palliative care: Secondary | ICD-10-CM | POA: Diagnosis not present

## 2015-09-14 DIAGNOSIS — G919 Hydrocephalus, unspecified: Secondary | ICD-10-CM

## 2015-09-14 DIAGNOSIS — Z833 Family history of diabetes mellitus: Secondary | ICD-10-CM | POA: Diagnosis not present

## 2015-09-14 DIAGNOSIS — R296 Repeated falls: Secondary | ICD-10-CM | POA: Diagnosis present

## 2015-09-14 DIAGNOSIS — Z8249 Family history of ischemic heart disease and other diseases of the circulatory system: Secondary | ICD-10-CM | POA: Diagnosis not present

## 2015-09-14 DIAGNOSIS — E538 Deficiency of other specified B group vitamins: Secondary | ICD-10-CM | POA: Diagnosis not present

## 2015-09-14 DIAGNOSIS — Z7984 Long term (current) use of oral hypoglycemic drugs: Secondary | ICD-10-CM | POA: Diagnosis not present

## 2015-09-14 DIAGNOSIS — E440 Moderate protein-calorie malnutrition: Secondary | ICD-10-CM | POA: Diagnosis present

## 2015-09-14 DIAGNOSIS — G92 Toxic encephalopathy: Secondary | ICD-10-CM | POA: Diagnosis present

## 2015-09-14 DIAGNOSIS — Y92099 Unspecified place in other non-institutional residence as the place of occurrence of the external cause: Secondary | ICD-10-CM

## 2015-09-14 DIAGNOSIS — E1165 Type 2 diabetes mellitus with hyperglycemia: Secondary | ICD-10-CM | POA: Diagnosis present

## 2015-09-14 DIAGNOSIS — R4182 Altered mental status, unspecified: Secondary | ICD-10-CM

## 2015-09-14 DIAGNOSIS — N390 Urinary tract infection, site not specified: Secondary | ICD-10-CM | POA: Diagnosis not present

## 2015-09-14 DIAGNOSIS — F0390 Unspecified dementia without behavioral disturbance: Secondary | ICD-10-CM | POA: Diagnosis present

## 2015-09-14 DIAGNOSIS — Z87440 Personal history of urinary (tract) infections: Secondary | ICD-10-CM | POA: Diagnosis not present

## 2015-09-14 DIAGNOSIS — E1142 Type 2 diabetes mellitus with diabetic polyneuropathy: Secondary | ICD-10-CM | POA: Diagnosis present

## 2015-09-14 DIAGNOSIS — F05 Delirium due to known physiological condition: Secondary | ICD-10-CM | POA: Diagnosis present

## 2015-09-14 DIAGNOSIS — IMO0002 Reserved for concepts with insufficient information to code with codable children: Secondary | ICD-10-CM

## 2015-09-14 DIAGNOSIS — I10 Essential (primary) hypertension: Secondary | ICD-10-CM | POA: Diagnosis present

## 2015-09-14 DIAGNOSIS — Y92009 Unspecified place in unspecified non-institutional (private) residence as the place of occurrence of the external cause: Secondary | ICD-10-CM | POA: Insufficient documentation

## 2015-09-14 DIAGNOSIS — R410 Disorientation, unspecified: Secondary | ICD-10-CM | POA: Diagnosis present

## 2015-09-14 DIAGNOSIS — E049 Nontoxic goiter, unspecified: Secondary | ICD-10-CM | POA: Diagnosis present

## 2015-09-14 DIAGNOSIS — E876 Hypokalemia: Secondary | ICD-10-CM | POA: Diagnosis present

## 2015-09-14 DIAGNOSIS — R531 Weakness: Secondary | ICD-10-CM

## 2015-09-14 DIAGNOSIS — E118 Type 2 diabetes mellitus with unspecified complications: Secondary | ICD-10-CM

## 2015-09-14 DIAGNOSIS — E213 Hyperparathyroidism, unspecified: Secondary | ICD-10-CM | POA: Diagnosis present

## 2015-09-14 DIAGNOSIS — Z794 Long term (current) use of insulin: Secondary | ICD-10-CM

## 2015-09-14 DIAGNOSIS — E785 Hyperlipidemia, unspecified: Secondary | ICD-10-CM | POA: Diagnosis present

## 2015-09-14 DIAGNOSIS — N3000 Acute cystitis without hematuria: Secondary | ICD-10-CM

## 2015-09-14 DIAGNOSIS — E119 Type 2 diabetes mellitus without complications: Secondary | ICD-10-CM

## 2015-09-14 HISTORY — DX: Disorientation, unspecified: R41.0

## 2015-09-14 HISTORY — DX: Need for assistance with personal care: Z74.1

## 2015-09-14 HISTORY — DX: Polyneuropathy, unspecified: G62.9

## 2015-09-14 LAB — CBC WITH DIFFERENTIAL/PLATELET
Basophils Absolute: 0 10*3/uL (ref 0.0–0.1)
Basophils Relative: 0 %
EOS ABS: 0.1 10*3/uL (ref 0.0–0.7)
EOS PCT: 1 %
HCT: 34.9 % — ABNORMAL LOW (ref 36.0–46.0)
HEMOGLOBIN: 12.3 g/dL (ref 12.0–15.0)
LYMPHS ABS: 2.2 10*3/uL (ref 0.7–4.0)
Lymphocytes Relative: 21 %
MCH: 30.1 pg (ref 26.0–34.0)
MCHC: 35.2 g/dL (ref 30.0–36.0)
MCV: 85.3 fL (ref 78.0–100.0)
MONOS PCT: 5 %
Monocytes Absolute: 0.5 10*3/uL (ref 0.1–1.0)
NEUTROS PCT: 73 %
Neutro Abs: 7.9 10*3/uL — ABNORMAL HIGH (ref 1.7–7.7)
Platelets: 334 10*3/uL (ref 150–400)
RBC: 4.09 MIL/uL (ref 3.87–5.11)
RDW: 12.5 % (ref 11.5–15.5)
WBC: 10.7 10*3/uL — ABNORMAL HIGH (ref 4.0–10.5)

## 2015-09-14 LAB — URINALYSIS, ROUTINE W REFLEX MICROSCOPIC
Glucose, UA: NEGATIVE mg/dL
KETONES UR: 40 mg/dL — AB
LEUKOCYTES UA: NEGATIVE
NITRITE: NEGATIVE
Specific Gravity, Urine: >1.03 — ABNORMAL HIGH (ref 1.005–1.030)
pH: 6.5 (ref 5.0–8.0)

## 2015-09-14 LAB — COMPREHENSIVE METABOLIC PANEL
ALBUMIN: 3.8 g/dL (ref 3.5–5.0)
ALK PHOS: 43 U/L (ref 38–126)
ALT: 15 U/L (ref 14–54)
AST: 21 U/L (ref 15–41)
Anion gap: 10 (ref 5–15)
BILIRUBIN TOTAL: 0.8 mg/dL (ref 0.3–1.2)
BUN: 14 mg/dL (ref 6–20)
CALCIUM: 9.9 mg/dL (ref 8.9–10.3)
CO2: 27 mmol/L (ref 22–32)
Chloride: 105 mmol/L (ref 101–111)
Creatinine, Ser: 0.64 mg/dL (ref 0.44–1.00)
GFR calc Af Amer: >60 mL/min (ref >60)
GFR calc non Af Amer: >60 mL/min (ref >60)
GLUCOSE: 149 mg/dL — AB (ref 65–99)
Potassium: 3 mmol/L — ABNORMAL LOW (ref 3.5–5.1)
Sodium: 142 mmol/L (ref 135–145)
TOTAL PROTEIN: 7.6 g/dL (ref 6.5–8.1)

## 2015-09-14 LAB — LACTIC ACID, PLASMA: Lactic Acid, Venous: 1.2 mmol/L (ref 0.5–2.0)

## 2015-09-14 LAB — URINE MICROSCOPIC-ADD ON

## 2015-09-14 LAB — CK: Total CK: 384 U/L — ABNORMAL HIGH (ref 38–234)

## 2015-09-14 LAB — TROPONIN I: Troponin I: 0.03 ng/mL (ref <0.031)

## 2015-09-14 LAB — MAGNESIUM: Magnesium: 1.4 mg/dL — ABNORMAL LOW (ref 1.7–2.4)

## 2015-09-14 MED ORDER — LISINOPRIL 10 MG PO TABS
20.0000 mg | ORAL_TABLET | Freq: Every day | ORAL | Status: DC
  Administered 2015-09-15 – 2015-09-20 (×6): 20 mg via ORAL
  Filled 2015-09-14 (×6): qty 2

## 2015-09-14 MED ORDER — ENOXAPARIN SODIUM 40 MG/0.4ML ~~LOC~~ SOLN
40.0000 mg | SUBCUTANEOUS | Status: DC
  Administered 2015-09-15 – 2015-09-20 (×6): 40 mg via SUBCUTANEOUS
  Filled 2015-09-14 (×6): qty 0.4

## 2015-09-14 MED ORDER — POTASSIUM CHLORIDE CRYS ER 20 MEQ PO TBCR
30.0000 meq | EXTENDED_RELEASE_TABLET | Freq: Three times a day (TID) | ORAL | Status: AC
  Administered 2015-09-15 (×3): 30 meq via ORAL
  Filled 2015-09-14 (×3): qty 1

## 2015-09-14 MED ORDER — BISACODYL 5 MG PO TBEC: 5.0000 mg | DELAYED_RELEASE_TABLET | Freq: Every day | ORAL | Status: DC | PRN

## 2015-09-14 MED ORDER — GLIPIZIDE 5 MG PO TABS
10.0000 mg | ORAL_TABLET | Freq: Every day | ORAL | Status: DC
  Administered 2015-09-15: 10 mg via ORAL
  Filled 2015-09-14: qty 2

## 2015-09-14 MED ORDER — ONDANSETRON HCL 4 MG/2ML IJ SOLN: 4.0000 mg | Freq: Four times a day (QID) | INTRAMUSCULAR | Status: DC | PRN

## 2015-09-14 MED ORDER — ACETAMINOPHEN 650 MG RE SUPP
650.0000 mg | Freq: Four times a day (QID) | RECTAL | Status: DC | PRN
Start: 2015-09-14 — End: 2015-09-20

## 2015-09-14 MED ORDER — INSULIN ASPART 100 UNIT/ML ~~LOC~~ SOLN: 2.0000 [IU] | Freq: Three times a day (TID) | SUBCUTANEOUS | Status: DC

## 2015-09-14 MED ORDER — ACETAMINOPHEN 325 MG PO TABS: 650.0000 mg | ORAL_TABLET | Freq: Four times a day (QID) | ORAL | Status: DC | PRN

## 2015-09-14 MED ORDER — POLYETHYLENE GLYCOL 3350 17 G PO PACK: 17.0000 g | PACK | Freq: Every day | ORAL | Status: DC | PRN

## 2015-09-14 MED ORDER — INSULIN ASPART 100 UNIT/ML ~~LOC~~ SOLN: 0.0000 [IU] | Freq: Every day | SUBCUTANEOUS | Status: DC

## 2015-09-14 MED ORDER — POTASSIUM CHLORIDE CRYS ER 20 MEQ PO TBCR
40.0000 meq | EXTENDED_RELEASE_TABLET | Freq: Once | ORAL | Status: AC
  Administered 2015-09-14: 40 meq via ORAL
  Filled 2015-09-14: qty 2

## 2015-09-14 MED ORDER — ONDANSETRON HCL 4 MG PO TABS: 4.0000 mg | ORAL_TABLET | Freq: Four times a day (QID) | ORAL | Status: DC | PRN

## 2015-09-14 MED ORDER — METFORMIN HCL 500 MG PO TABS
500.0000 mg | ORAL_TABLET | Freq: Two times a day (BID) | ORAL | Status: DC
  Administered 2015-09-15 – 2015-09-20 (×12): 500 mg via ORAL
  Filled 2015-09-14 (×12): qty 1

## 2015-09-14 MED ORDER — DEXTROSE 5 % IV SOLN
1.0000 g | INTRAVENOUS | Status: DC
  Administered 2015-09-14 – 2015-09-16 (×3): 1 g via INTRAVENOUS
  Filled 2015-09-14 (×6): qty 10

## 2015-09-14 MED ORDER — ALUM & MAG HYDROXIDE-SIMETH 200-200-20 MG/5ML PO SUSP: 30.0000 mL | Freq: Four times a day (QID) | ORAL | Status: DC | PRN

## 2015-09-14 MED ORDER — VITAMIN D (ERGOCALCIFEROL) 1.25 MG (50000 UNIT) PO CAPS
50000.0000 [IU] | ORAL_CAPSULE | ORAL | Status: DC
  Administered 2015-09-17: 50000 [IU] via ORAL
  Filled 2015-09-14: qty 1

## 2015-09-14 MED ORDER — SODIUM CHLORIDE 0.9 % IV SOLN
INTRAVENOUS | Status: DC
  Administered 2015-09-14: 100 mL/h via INTRAVENOUS
  Administered 2015-09-15 – 2015-09-18 (×5): via INTRAVENOUS

## 2015-09-14 MED ORDER — SODIUM CHLORIDE 0.9 % IV SOLN
INTRAVENOUS | Status: AC
  Administered 2015-09-14: via INTRAVENOUS

## 2015-09-14 NOTE — ED Notes (Signed)
Unable to stand or walk pt. Per son he carried her to wheelchair, pt will not follow commands of standing, getting on bedpan. Pt repeats commands but will not follow. Pt confused and with tech assistance we do not feel comfortable getting pt out of bed.

## 2015-09-14 NOTE — ED Notes (Signed)
Hospitalist at the bedside to talk with son

## 2015-09-14 NOTE — H&P (Signed)
[facility] History and Physical  [name] YIF:[id number] DOB: 17-Jun-1946 DOA: 09/14/2015  Referring physician: DR Thurnell Garbe  PCP: Shade Flood, MD   Chief Complaint: Gen weakness, falling  HPI: Maria Parrish is a 70 y.o. female with hx of DM, HTN, UTI presented to ED after patient's son found her lying on the floor at home.  She has been falling recently frequently , increased confusion for 5 months adn worse this week.  Pt doesn't remember falling.  Has been unable to stand or walk, confined to [place].  Will not follow commands to stand or to get on bedpan.  Pt confused at home.   Pt's son lives w his mother and is the caretaker. He says nobody has diagnosed her with "dementia" but that some of his friends have loved ones with dementia and he has been talking with some of them about her problems. He says 1 yr ago she was driving, walking and taking care of herself.  Significant decline since then. Was admitted last Dec 2016 with UTI and AMS.    The son works first shift, feels like he needs some assistance for her during the day. "Unpredictable" he says.    Na 142 K 3.0 CO2 27 BUN 14  Creat 0.64  Ca 9.9  Alb 3.8  Glucose 149  LFT's wnl, TProt 7.6 trop 0.03 Lactic acid 0.03   WBC 10k  Hb 12  plt 334 UA >300 protein, SG > 1.030, 6-30 epi/ wbc, 0-5 rbc, few bact, 40 ketones, pH 6.5   Home meds: Glucotrol, lisinopril, Metformin, Vit D  EKG (indep reviewed):  Sinus rhythm 80 bpm, no acute changes.  QTc  460 msec wnl.  CXR: no acute changes  Chart review: one prior hosp stay in Sept 2016 with hx UTI, DM2 uncontrolled, confusion, dehydration, HTN, HL, malnutrition. Presented with confusion,  UCx grew GNR, rx with cipro.  Had stopped her oral DM meds because of concerns about side effects she saw on TV.  Restarted on metformin and glipizide.    ROS  denies CP  no joint pain   no HA  no blurry vision  no rash  no diarrhea  no nausea/ vomiting  no dysuria  no difficulty voiding  no change in urine color    Where does patient live with her son Can patient participate in ADLs? Not really   Past Medical History  Past Medical History  Diagnosis Date  . Diabetes mellitus without complication ([place])   . Hypertension   . Hyperlipidemia   . Hypercalcemia   . Hyperparathyroidism ([place])   . Confusion   . Assistance needed for mobility     walks with walker  . Peripheral neuropathy [facility])    Past Surgical History  Past Surgical History  Procedure Laterality Date  . None    . Colonoscopy N/A 08/12/2015    Procedure: COLONOSCOPY;  Surgeon: Daneil Dolin, MD;  [facility];  Service: Endoscopy;  Laterality: N/A;  1245pm  . Esophagogastroduodenoscopy N/A 08/12/2015    Procedure: ESOPHAGOGASTRODUODENOSCOPY (EGD);  Surgeon: Daneil Dolin, MD;  [facility];  Service: Endoscopy;  Laterality: N/A;   Family History  Family History  Problem Relation Age of Onset  . Diabetes Father   . Heart attack Mother   . Colon cancer Neg Hx   . Hypertension Brother   . Hypertension Brother   . Diabetes Brother    Social History  reports that she has never  smoked. She does not have any smokeless tobacco history on file. She reports that she does not drink alcohol or use illicit drugs. Allergies  Allergies  Allergen Reactions  . Penicillins Other (See Comments)    Makes patient sore.  Has patient had a PCN reaction causing immediate rash, facial/tongue/throat swelling, SOB or lightheadedness with hypotension: No Has patient had a PCN reaction causing severe rash involving mucus membranes or skin necrosis: No Has patient had a PCN reaction that required hospitalization No Has patient had a PCN reaction occurring within the last 10 years: No If all of the above answers are "NO", then may proceed with Cephalosporin use.   . Statins Other (See Comments)    Makes wrists and fingers ache   Home medications Prior to Admission medications   Medication Sig  Start Date End Date Taking? Authorizing Provider  glipiZIDE (GLUCOTROL) 10 MG tablet Take 1 tablet (10 mg total) by mouth daily before breakfast. 09/09/15  Yes Cassandria Anger, MD  lisinopril (PRINIVIL,ZESTRIL) 20 MG tablet Take 1 tablet (20 mg total) by mouth daily. 03/27/15  Yes Kathie Dike, MD  metFORMIN (GLUCOPHAGE) 500 MG tablet Take 1 tablet (500 mg total) by mouth 2 (two) times daily. 03/27/15  Yes Kathie Dike, MD  Vitamin D, Ergocalciferol, (DRISDOL) 50000 units CAPS capsule Take 1 capsule (50,000 Units total) by mouth every 7 (seven) days. 09/09/15  Yes Cassandria Anger, MD   Liver Function Tests  Recent Labs Lab 09/14/15 1948  AST 21  ALT 15  ALKPHOS 43  BILITOT 0.8  PROT 7.6  ALBUMIN 3.8   No results for input(s): LIPASE, AMYLASE in the last 168 hours. CBC  Recent Labs Lab 09/14/15 1948  WBC 10.7*  NEUTROABS 7.9*  HGB 12.3  HCT 34.9*  MCV 85.3  PLT 983   Basic Metabolic Panel  Recent Labs Lab 09/14/15 1948  NA 142  K 3.0*  CL 105  CO2 27  GLUCOSE 149*  BUN 14  CREATININE 0.64  CALCIUM 9.9     Filed Vitals:   09/14/15 1807 09/14/15 2001  BP: 170/76 188/89  Pulse: 91 79  Temp: 98 F (36.7 C)   TempSrc: Oral   Resp: 18 18  Height: '5\' 6"'  (1.676 m)   Weight: 56.7 kg (125 lb)   SpO2: 97% 100%   Exam: Pleasantly confused, knows "[place] Na", no idea what year or month it is, calm No rash, cyanosis or gangrene Sclera anicteric, throat clear NO jvd Chest clear bilat RRR no MRG ABd soft ntnd no mass or ascites no hsm GU defer MS no joint effusion/ deform, musc tone slightly dec'd Ext no edema/ wounds Neuro as above, nonfocal, moving 4 ext  Na 142 K 3.0 CO2 27 BUN 14  Creat 0.64  Ca 9.9  Alb 3.8  Glucose 149  LFT's wnl, TProt 7.6 trop 0.03 Lactic acid 0.03   WBC 10k  Hb 12  plt 334 UA >300 protein, SG > 1.030, 6-30 epi/ wbc, 0-5 rbc, few bact, 40 ketones, pH 6.5    Assessment: 1  Altered mental status - prob due to UTI in  setting of underlying dementia. 2  UTI - had Klebs UTI last year with similar presentation   3  Dementia - progressive decline over the last 12 months, was driving 1 yr ago. Recent w/u showed TSH was normal in Jan 2017.  B12 was low at 181 (180- 914) when measured in Sept 2016. Will repeat and replete if needed.  CT tonight has no acute changes, chronic atrophy and WM disease.  4  HTN on ACEi 5  DM2 on glipizide and metformin 6  Hypokalemia 7  Ketonuria - without met acidosis. Prob has not been eating.   Plan - admit , IVF"s, P.T. Eval, repeat B12 level and replace if needed. Replace KCl.  Have d/w son about getting palliative care involved to establish GOC and he is agreeable.    DVT Prophylaxis lovenox  Code Status: full  Family Communication: son is here  Disposition Plan: ?    Sol Blazing [facility] Pager [phone]  Cell [phone]  If 7PM-7AM, please contact night-coverage [email or web address] Password [facility] 09/14/2015, 10:00 PM  '

## 2015-09-14 NOTE — Progress Notes (Signed)
Pharmacy Note:  Initial antibiotics for Rocephin ordered by EDP for UTI.  Estimated Creatinine Clearance: 59.4 mL/min (by C-G formula based on Cr of 0.64).   Allergies  Allergen Reactions  . Penicillins Other (See Comments)    Makes patient sore.  Has patient had a PCN reaction causing immediate rash, facial/tongue/throat swelling, SOB or lightheadedness with hypotension: No Has patient had a PCN reaction causing severe rash involving mucus membranes or skin necrosis: No Has patient had a PCN reaction that required hospitalization No Has patient had a PCN reaction occurring within the last 10 years: No If all of the above answers are "NO", then may proceed with Cephalosporin use.   . Statins Other (See Comments)    Makes wrists and fingers ache    Filed Vitals:   09/14/15 1807 09/14/15 2001  BP: 170/76 188/89  Pulse: 91 79  Temp: 98 F (36.7 C)   Resp: 18 18    Anti-infectives    None     PCN allergy noted, has received Rocephin previous admission.  Plan: Initial doses of Rocephin 1gm IV every 24 hours ordered. F/U admission orders for further dosing if therapy continued.  Pricilla Larsson, [facility] 09/14/2015 9:13 PM

## 2015-09-14 NOTE — ED Notes (Signed)
Pt oriented to place, states she get confused, pt has been to CT, does not remember that she has been to xray. Son at the bedside

## 2015-09-14 NOTE — ED Notes (Signed)
Son is caregiver at home, he works day shift. Pt was to go to family doctor soon to be assessed for assistance. Pt is agitated will not lay still in bed. Pulling on gown and monitor leads.

## 2015-09-14 NOTE — ED Notes (Signed)
Patients son states he came home from work and found patient lying in floor. Son reports patient has been falling more recently and has had increased confusion x5 months but worse within the last week. Patient denies remembering falling. Denies pain. Patient alert and oriented x3. NAD noted in triage.

## 2015-09-14 NOTE — ED Provider Notes (Signed)
CSN: [id number]     Arrival date & time 09/14/15  1801 History   First MD Initiated Contact with Patient 09/14/15 1904     Chief Complaint  Patient presents with  . Fall     Patient is a 70 y.o. female presenting with fall. The history is provided by a caregiver, the patient and a relative. The history is limited by the condition of the patient (AMS).  Fall  Pt was seen at 1915. Per pt's family:  Family states pt has been "generally weak" for the past 2 days. Today, a friend came to visit with pt at 1330. Pt was sitting in a chair when they left. This friend then tried to call pt several times afterwards, but pt did not answer the phone. Pt's son came home from work at 46 and found pt's chair tipped over and pt laying on the floor. Pt's son was able to stand pt up and walk her to the bed to sit and rest. Pt's son then walked pt to the bathroom. Pt then called out while in the bathroom, and pt's son found her laying on the floor again. No LOC. Pt's son states pt does have hx of confusion for the past 5+ months, but it has been "worse" over the past week. Pt is currently confused, does not recall events today and denies any specific complaints.   Past Medical History  Diagnosis Date  . Diabetes mellitus without complication ([place])   . Hypertension   . Hyperlipidemia   . Hypercalcemia   . Hyperparathyroidism ([place])   . Confusion   . Assistance needed for mobility     walks with walker  . Peripheral neuropathy [facility])    Past Surgical History  Procedure Laterality Date  . None    . Colonoscopy N/A 08/12/2015    Procedure: COLONOSCOPY;  Surgeon: Daneil Dolin, MD;  [facility];  Service: Endoscopy;  Laterality: N/A;  1245pm  . Esophagogastroduodenoscopy N/A 08/12/2015    Procedure: ESOPHAGOGASTRODUODENOSCOPY (EGD);  Surgeon: Daneil Dolin, MD;  [facility];  Service: Endoscopy;  Laterality: N/A;   Family History  Problem Relation Age of Onset  . Diabetes Father    . Heart attack Mother   . Colon cancer Neg Hx   . Hypertension Brother   . Hypertension Brother   . Diabetes Brother    Social History  Substance Use Topics  . Smoking status: Never Smoker   . Smokeless tobacco: None  . Alcohol Use: No    Review of Systems  Unable to perform ROS: Mental status change      Allergies  Penicillins and Statins  Home Medications   Prior to Admission medications   Medication Sig Start Date End Date Taking? Authorizing Provider  glipiZIDE (GLUCOTROL) 10 MG tablet Take 1 tablet (10 mg total) by mouth daily before breakfast. 09/09/15  Yes Cassandria Anger, MD  lisinopril (PRINIVIL,ZESTRIL) 20 MG tablet Take 1 tablet (20 mg total) by mouth daily. 03/27/15  Yes Kathie Dike, MD  metFORMIN (GLUCOPHAGE) 500 MG tablet Take 1 tablet (500 mg total) by mouth 2 (two) times daily. 03/27/15  Yes Kathie Dike, MD  Vitamin D, Ergocalciferol, (DRISDOL) 50000 units CAPS capsule Take 1 capsule (50,000 Units total) by mouth every 7 (seven) days. 09/09/15  Yes Cassandria Anger, MD   BP 188/89 mmHg  Pulse 79  Temp(Src) 98 F (36.7 C) (Oral)  Resp 18  Ht 5\' 6"  (1.676 m)  Wt 125  lb (56.7 kg)  BMI 20.19 kg/m2  SpO2 100%   19:39:08 Orthostatic Vital Signs RC  Orthostatic Lying  - BP- Lying: 188/82 mmHg ; Pulse- Lying: 78  Orthostatic Sitting - BP- Sitting: 204/88 mmHg ; Pulse- Sitting: 81      Physical Exam  1920: Physical examination:  Nursing notes reviewed; Vital signs and O2 SAT reviewed;  Constitutional: Well developed, Well nourished, Well hydrated, In no acute distress; Head:  Normocephalic, atraumatic; Eyes: EOMI, PERRL, No scleral icterus; ENMT: Mouth and pharynx normal, Mucous membranes moist; Neck: Supple, Full range of motion, No lymphadenopathy; Cardiovascular: Regular rate and rhythm, No gallop; Respiratory: Breath sounds clear & equal bilaterally, No wheezes.  Speaking full sentences with ease, Normal respiratory effort/excursion; Chest:  Nontender, Movement normal; Abdomen: Soft, Nontender, Nondistended, Normal bowel sounds; Genitourinary: No CVA tenderness; Extremities: Pulses normal, No tenderness, No edema, No calf edema or asymmetry.; Neuro: Awake, alert, confused re: time, place, events. Major CN grossly intact. No facial droop. Speech clear. Grips equal. Moves all extremities spontaneously and to command without apparent gross focal motor deficits.; Skin: Color normal, Warm, Dry.   ED Course  Procedures (including critical care time) Labs Review   Imaging Review  I have personally reviewed and evaluated these images and lab results as part of my medical decision-making.   EKG Interpretation   Date/Time:  Tuesday September 14 2015 19:51:02 EST Ventricular Rate:  79 PR Interval:  109 QRS Duration: 78 QT Interval:  404 QTC Calculation: 463 R Axis:   22 Text Interpretation:  Sinus rhythm Short PR interval Nonspecific T wave  abnormality Lateral leads Artifact When compared with ECG of 03/25/2015 T  wave abnormality is more pronounced Confirmed by [facility]  MD, Nunzio Cory  [id number]) on 09/14/2015 8:27:37 PM      MDM  MDM Reviewed: previous chart, nursing note and vitals Reviewed previous: labs and ECG Interpretation: labs, ECG, x-ray and CT scan      Results for orders placed or performed during the hospital encounter of 09/14/15  Urinalysis, Routine w reflex microscopic  Result Value Ref Range   Color, Urine YELLOW YELLOW   APPearance CLEAR CLEAR   Specific Gravity, Urine >1.030 (H) 1.005 - 1.030   pH 6.5 5.0 - 8.0   Glucose, UA NEGATIVE NEGATIVE mg/dL   Hgb urine dipstick TRACE (A) NEGATIVE   Bilirubin Urine SMALL (A) NEGATIVE   Ketones, ur 40 (A) NEGATIVE mg/dL   Protein, ur >300 (A) NEGATIVE mg/dL   Nitrite NEGATIVE NEGATIVE   Leukocytes, UA NEGATIVE NEGATIVE  CK  Result Value Ref Range   Total CK 384 (H) 38 - 234 U/L  Comprehensive metabolic panel  Result Value Ref Range   Sodium 142 135 - 145  mmol/L   Potassium 3.0 (L) 3.5 - 5.1 mmol/L   Chloride 105 101 - 111 mmol/L   CO2 27 22 - 32 mmol/L   Glucose, Bld 149 (H) 65 - 99 mg/dL   BUN 14 6 - 20 mg/dL   Creatinine, Ser 0.64 0.44 - 1.00 mg/dL   Calcium 9.9 8.9 - 10.3 mg/dL   Total Protein 7.6 6.5 - 8.1 g/dL   Albumin 3.8 3.5 - 5.0 g/dL   AST 21 15 - 41 U/L   ALT 15 14 - 54 U/L   Alkaline Phosphatase 43 38 - 126 U/L   Total Bilirubin 0.8 0.3 - 1.2 mg/dL   GFR calc non Af Amer >60 >60 mL/min   GFR calc Af Amer >60 >60 mL/min  Anion gap 10 5 - 15  Troponin I  Result Value Ref Range   Troponin I 0.03 <0.031 ng/mL  Lactic acid, plasma  Result Value Ref Range   Lactic Acid, Venous 1.2 0.5 - 2.0 mmol/L  CBC with Differential  Result Value Ref Range   WBC 10.7 (H) 4.0 - 10.5 K/uL   RBC 4.09 3.87 - 5.11 MIL/uL   Hemoglobin 12.3 12.0 - 15.0 g/dL   HCT 34.9 (L) 36.0 - 46.0 %   MCV 85.3 78.0 - 100.0 fL   MCH 30.1 26.0 - 34.0 pg   MCHC 35.2 30.0 - 36.0 g/dL   RDW 12.5 11.5 - 15.5 %   Platelets 334 150 - 400 K/uL   Neutrophils Relative % 73 %   Neutro Abs 7.9 (H) 1.7 - 7.7 K/uL   Lymphocytes Relative 21 %   Lymphs Abs 2.2 0.7 - 4.0 K/uL   Monocytes Relative 5 %   Monocytes Absolute 0.5 0.1 - 1.0 K/uL   Eosinophils Relative 1 %   Eosinophils Absolute 0.1 0.0 - 0.7 K/uL   Basophils Relative 0 %   Basophils Absolute 0.0 0.0 - 0.1 K/uL  Urine microscopic-add on  Result Value Ref Range   Squamous Epithelial / LPF 6-30 (A) NONE SEEN   WBC, UA 6-30 0 - 5 WBC/hpf   RBC / HPF 0-5 0 - 5 RBC/hpf   Bacteria, UA FEW (A) NONE SEEN   Urine-Other AMORPHOUS URATES/PHOSPHATES    Dg Chest 2 View 09/14/2015  CLINICAL DATA:  Fall.  Altered level of consciousness. EXAM: CHEST  2 VIEW COMPARISON:  03/25/2015 FINDINGS: Heart size appears normal. No pleural effusion or edema identified. No airspace consolidation noted. The visualized osseous structures are unremarkable. IMPRESSION: 1. No acute cardiopulmonary abnormalities. Electronically  Signed   By: Kerby Moors M.D.   On: 09/14/2015 19:19   Ct Head Wo Contrast 09/14/2015  CLINICAL DATA:  Fall, altered mental status, chronic confusion EXAM: CT HEAD WITHOUT CONTRAST TECHNIQUE: Contiguous axial images were obtained from the base of the skull through the vertex without contrast. COMPARISON:  03/25/2015 FINDINGS: Similar diffuse brain atrophy with chronic white matter microvascular changes throughout the cerebral hemispheres. Mild associated ventricular enlargement as before. No acute intracranial hemorrhage, definite infarction, mass lesion, midline shift, herniation, or extra-axial fluid collection. No focal mass effect or edema. Cisterns are patent. Cerebellar atrophy as well. Atherosclerosis of the intracranial vessels of the skullbase. Symmetric orbits. Mastoids and sinuses remain clear. Skull intact. IMPRESSION: Stable atrophy and chronic white matter microvascular ischemic changes. No acute intracranial finding or interval change by noncontrast imaging. Electronically Signed   By: Jerilynn Mages.  Shick M.D.   On: 09/14/2015 19:31    2120:  Pt unable to stand safely. Pt not orthostatic on VS. +UTI, UC pending; will dose IV rocephin. Potassium repleted PO; magnesium level pending. CK total mildly elevated s/p fall and SG urine elevated; judicious IVF given. Dx and testing d/w pt and family.  Questions answered.  Verb understanding, agreeable to admit. T/C to [facility] Dr. Jonnie Finner, case discussed, including:  HPI, pertinent PM/SHx, VS/PE, dx testing, ED course and treatment:  Agreeable to admit, requests to write temporary orders, obtain tele bed to team [facility].   Francine Graven, DO 09/16/15 2357

## 2015-09-15 ENCOUNTER — Inpatient Hospital Stay (HOSPITAL_COMMUNITY): Payer: Medicare HMO

## 2015-09-15 LAB — GLUCOSE, CAPILLARY
GLUCOSE-CAPILLARY: 132 mg/dL — AB (ref 65–99)
GLUCOSE-CAPILLARY: 138 mg/dL — AB (ref 65–99)
Glucose-Capillary: 105 mg/dL — ABNORMAL HIGH (ref 65–99)
Glucose-Capillary: 168 mg/dL — ABNORMAL HIGH (ref 65–99)
Glucose-Capillary: 157 mg/dL — ABNORMAL HIGH (ref 65–99)

## 2015-09-15 LAB — VITAMIN B12: Vitamin B-12: 158 pg/mL — ABNORMAL LOW (ref 180–914)

## 2015-09-15 MED ORDER — CYANOCOBALAMIN 1000 MCG/ML IJ SOLN
1000.0000 ug | Freq: Once | INTRAMUSCULAR | Status: AC
  Administered 2015-09-15: 1000 ug via INTRAMUSCULAR
  Filled 2015-09-15: qty 1

## 2015-09-15 NOTE — Plan of Care (Signed)
Report given to [facility]. End of shift. Ruben Im SN [facility]

## 2015-09-15 NOTE — Plan of Care (Signed)
Report Received. Assume care of patient. Ruben Im SN [facility]

## 2015-09-15 NOTE — Progress Notes (Signed)
Maria Parrish [id number] DOB: 1946-02-23 DOA: 09/14/2015 PCP: Shade Flood, MD  Brief narrative: 89 ? hyperparathryoidism ? 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 [place] HTn Recent wght loss-Colo=diverticulosis/Endo 08/12/15 only hiatal hernia/gastric polyp  Patient admitted after episodic weakness, fall 2/28 Overall debility X 12 months Patient confused after fall and brought the emergency room CT head chest x-ray showed only microvascular changes   Past medical history-As per Problem list Chart reviewed as below-   Consultants:  None  Procedures:  None  Antibiotics:  None   Subjective   Patient somewhat confused contaminant at [facility] but seems disoriented otherwise. Seems to take a long time to process and respond although responses eventually are appropriate    Objective    Interim History:   Telemetry: Sinus rhythm   Objective: Filed Vitals:   09/15/15 0148 09/15/15 0245 09/15/15 0530 09/15/15 0936  BP: 194/85 160/82 168/88 194/80  Pulse:    78  Temp: 98.4 F (36.9 C)   98.2 F (36.8 C)  TempSrc: Oral   Oral  Resp:  18 20 17   Height:      Weight:      SpO2:  98%  98%    Intake/Output Summary (Last 24 hours) at 09/15/15 1512 Last data filed at 09/15/15 1300  Gross per 24 hour  Intake  502.5 ml  Output      0 ml  Net  502.5 ml    Exam:  General: EOMI NCAT Cardiovascular: S1-S2 no murmur rub or gallop Respiratory: Clinically clear no added sound Abdomen: Soft nontender nondistended no rebound no guarding Skin: no lower extremity edema, path of skin on Upper R neck Neuro: power intact but patient confused and moving slowly   Data Reviewed: Basic Metabolic Panel:  Recent Labs Lab 09/14/15 1948 09/14/15 1950  NA 142  --   K 3.0*  --   CL 105  --   CO2 27  --   GLUCOSE 149*  --   BUN 14  --   CREATININE 0.64  --   CALCIUM 9.9  --   MG  --  1.4*   Liver Function Tests:  Recent Labs Lab 09/14/15 1948  AST 21    ALT 15  ALKPHOS 43  BILITOT 0.8  PROT 7.6  ALBUMIN 3.8   No results for input(s): LIPASE, AMYLASE in the last 168 hours. No results for input(s): AMMONIA in the last 168 hours. CBC:  Recent Labs Lab 09/14/15 1948  WBC 10.7*  NEUTROABS 7.9*  HGB 12.3  HCT 34.9*  MCV 85.3  PLT 334   Cardiac Enzymes:  Recent Labs Lab 09/14/15 1948  CKTOTAL 384*  TROPONINI 0.03   BNP: Invalid input(s): POCBNP CBG:  Recent Labs Lab 09/15/15 0127 09/15/15 0825 09/15/15 1214  GLUCAP 105* 132* 138*    No results found for this or any previous visit (from the past 240 hour(s)).   Studies:              All Imaging reviewed and is as per above notation   Scheduled Meds: . cefTRIAXone (ROCEPHIN)  IV  1 g Intravenous Q24H  . enoxaparin (LOVENOX) injection  40 mg Subcutaneous Q24H  . glipiZIDE  10 mg Oral QAC breakfast  . insulin aspart  0-5 Units Subcutaneous QHS  . insulin aspart  2-6 Units Subcutaneous TID WC  . lisinopril  20 mg Oral Daily  . metFORMIN  500 mg Oral BID WC  . potassium chloride  30 mEq Oral TID  . [START ON 09/17/2015] Vitamin D (Ergocalciferol)  50,000 Units Oral Q7 days   Continuous Infusions: . sodium chloride 100 mL/hr (09/14/15 2216)     Assessment/Plan:  1. Toxic metabolic encephalopathy-suspect 2/2 to B12 def.  B12 level 158.  Give 1000 microgram B12 now. Subsequently start folic acid 1 mg daily and see if that makes a difference. If no change, would consider input from neurology.  MRI ordered.  Await Urine cult. 2. Hyperparathyroidism-under care of Dr. Dorris Fetch.  Rpt PTH and Calcium levels and monitor.  Was rx with Vit D as this was low as OP 3. 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  cotninue Metfromin 500 bid, SSI-sugars 132-138 4. Htn-Lisinopril 20 daily   Called son Maria Parrish [phone] and updated  FULL CODE inpatient   Verneita Griffes, MD  [facility] Pager [phone] 09/15/2015, 3:12 PM    LOS: 1 day

## 2015-09-15 NOTE — Plan of Care (Signed)
Pt was sitting on side of bed. Readjusted and set bed alarm. Ruben Im SN [facility]

## 2015-09-16 ENCOUNTER — Inpatient Hospital Stay (HOSPITAL_COMMUNITY): Payer: Medicare HMO

## 2015-09-16 ENCOUNTER — Encounter (HOSPITAL_COMMUNITY): Payer: Self-pay | Admitting: Primary Care

## 2015-09-16 ENCOUNTER — Ambulatory Visit (HOSPITAL_COMMUNITY): Payer: Medicare HMO

## 2015-09-16 DIAGNOSIS — N390 Urinary tract infection, site not specified: Secondary | ICD-10-CM

## 2015-09-16 DIAGNOSIS — Z515 Encounter for palliative care: Secondary | ICD-10-CM | POA: Insufficient documentation

## 2015-09-16 LAB — GLUCOSE, CAPILLARY
GLUCOSE-CAPILLARY: 146 mg/dL — AB (ref 65–99)
GLUCOSE-CAPILLARY: 178 mg/dL — AB (ref 65–99)
GLUCOSE-CAPILLARY: 142 mg/dL — AB (ref 65–99)
Glucose-Capillary: 165 mg/dL — ABNORMAL HIGH (ref 65–99)

## 2015-09-16 LAB — URINE CULTURE: CULTURE: NO GROWTH

## 2015-09-16 MED ORDER — FOLIC ACID 1 MG PO TABS
1.0000 mg | ORAL_TABLET | Freq: Every day | ORAL | Status: DC
  Administered 2015-09-17 – 2015-09-20 (×4): 1 mg via ORAL
  Filled 2015-09-16 (×4): qty 1

## 2015-09-16 MED ORDER — CYANOCOBALAMIN 1000 MCG/ML IJ SOLN
1000.0000 ug | INTRAMUSCULAR | Status: AC
  Administered 2015-09-17: 1000 ug via INTRAMUSCULAR
  Filled 2015-09-16: qty 1

## 2015-09-16 NOTE — Progress Notes (Signed)
Maria Parrish [id number] DOB: February 20, 1946 DOA: 09/14/2015 PCP: Shade Flood, MD  Brief narrative: 69 ? hyperparathryoidism ? 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 Goiter HTn Recent wght loss-Colo=diverticulosis/Endo 08/12/15 only hiatal hernia/gastric polyp  Patient admitted after episodic weakness, fall 2/28 Overall debility X 12 months Patient confused after fall and brought the emergency room CT head chest x-ray showed only microvascular changes   Past medical history-As per Problem list Chart reviewed as below-   Consultants:  None  Procedures:  None  Antibiotics:  None   Subjective   A little better but still slow cognitively Cannot assess gait as in bed tol die t had visitors today Can tell me she is in the hopsital No n/v/cp     Objective    Interim History:   Telemetry: Sinus rhythm   Objective: Filed Vitals:   09/15/15 0936 09/15/15 1520 09/15/15 2145 09/16/15 0621  BP: 194/80 166/85 178/84 170/89  Pulse: 78 82 80 79  Temp: 98.2 F (36.8 C) 98.1 F (36.7 C) 98 F (36.7 C) 98 F (36.7 C)  TempSrc: Oral  Oral Oral  Resp: 17 20  18   Height:      Weight:      SpO2: 98% 99% 99% 98%    Intake/Output Summary (Last 24 hours) at 09/16/15 1338 Last data filed at 09/15/15 1700  Gross per 24 hour  Intake    240 ml  Output      0 ml  Net    240 ml    Exam:  General: EOMI NCAT Cardiovascular: S1-S2 no murmur rub or gallop Respiratory: Clinically clear no added sound Abdomen: Soft nontender nondistended no rebound no guarding Skin: no lower extremity edema, path of skin on Upper R neck Neuro: power intact but patient confused and moving slowly   Data Reviewed: Basic Metabolic Panel:  Recent Labs Lab 09/14/15 1948 09/14/15 1950  NA 142  --   K 3.0*  --   CL 105  --   CO2 27  --   GLUCOSE 149*  --   BUN 14  --   CREATININE 0.64  --   CALCIUM 9.9  --   MG  --  1.4*   Liver Function Tests:  Recent Labs Lab  09/14/15 1948  AST 21  ALT 15  ALKPHOS 43  BILITOT 0.8  PROT 7.6  ALBUMIN 3.8   No results for input(s): LIPASE, AMYLASE in the last 168 hours. No results for input(s): AMMONIA in the last 168 hours. CBC:  Recent Labs Lab 09/14/15 1948  WBC 10.7*  NEUTROABS 7.9*  HGB 12.3  HCT 34.9*  MCV 85.3  PLT 334   Cardiac Enzymes:  Recent Labs Lab 09/14/15 1948  CKTOTAL 384*  TROPONINI 0.03   BNP: Invalid input(s): POCBNP CBG:  Recent Labs Lab 09/15/15 1214 09/15/15 1717 09/15/15 2153 09/16/15 0713 09/16/15 1157  GLUCAP 138* 168* 157* 146* 165*    Recent Results (from the past 240 hour(s))  Urine culture     Status: None   Collection Time: 09/14/15  8:00 PM  Result Value Ref Range Status   Specimen Description URINE, CATHETERIZED  Final   Special Requests NONE  Final   Culture   Final    NO GROWTH 1 DAY Performed at [facility]    Report Status 09/16/2015 FINAL  Final     Studies:              All Imaging reviewed and  is as per above notation   Scheduled Meds: . cefTRIAXone (ROCEPHIN)  IV  1 g Intravenous Q24H  . enoxaparin (LOVENOX) injection  40 mg Subcutaneous Q24H  . insulin aspart  0-5 Units Subcutaneous QHS  . insulin aspart  2-6 Units Subcutaneous TID WC  . lisinopril  20 mg Oral Daily  . metFORMIN  500 mg Oral BID WC  . [START ON 09/17/2015] Vitamin D (Ergocalciferol)  50,000 Units Oral Q7 days   Continuous Infusions: . sodium chloride 100 mL/hr at 09/16/15 0754     Assessment/Plan:  1. Toxic metabolic encephalopathy-suspect 2/2 to B12 def.  B12 level 158.  Give 1000 microgram B12 now. Also start folic acid 1 mg daily. Still some cognitive slowing.  MRI ordered ? hydrocepghalus-Have ordered LP to determine if would ? benefit from shunt placement.  Urine cult NG x 1 day 2. Hyperparathyroidism-under care of Dr. Dorris Fetch.  Rpt PTH and Calcium levels and monitor.  Was rx with Vit D as this was low as OP 3. 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  cotninue  Metfromin 500 bid, SSI-sugars 132-138 4. Htn-Lisinopril 20 daily   No family + currently FULL CODE inpatient   Verneita Griffes, MD  [facility] Pager [phone] 09/16/2015, 1:38 PM    LOS: 2 days

## 2015-09-16 NOTE — Progress Notes (Signed)
Patient had order for lumbar puncture to be done.  Radiology stated that the patient's dose of Lovenox would have to be hold this evening prior to the procedure tomorrow.  Radiology also asked about lab studies to be performed on the fluid. Dr. Verlon Au notified via text page.  Oncoming nurse aware.  Dr. Merlene Laughter to see patient.

## 2015-09-16 NOTE — Consult Note (Signed)
Consultation Note Date: 09/16/2015   Patient Name: Maria Parrish  DOB: [birth year]  MRN: [id number]  Age / Sex: 70 y.o., female  PCP: Shade Flood, MD Referring Physician: Nita Sells, MD  Reason for Consultation: Disposition, Establishing goals of care and Psychosocial/spiritual support    Clinical Assessment/Narrative: Maria Parrish is resting quietly in bed. She makes eye contact and greets me as I enter. We talk about her functional status at home, including her frequent falls. She tells me that she lives in the [place]. And that her son Belenda Cruise lives with her and her hiring. We talk about functional status and after long pulse is, she tells me that she does do some housework, including some laundry and cleaning. She shares that she makes sandwiches for lunch. And that sometimes she and Greg eat sandwiches or pizza for dinner. It seems unlikely that she is able to prepare meals at this time. We talk about her current health issues including falls, recurrent urinary tract infections, and her diabetes.  I share my worry over her returning home and be unable to care for herself. She tells me what worries her most is that doctor Nida found a urinary tract infection that cleared but keeps coming back. She often has very long pauses when asked questions. No family at bedside today.  Contacts/Participants in Discussion: Maria Parrish. Primary Decision Maker: Maria Parrish seems unable to make her own decisions at this time. She tells me that she would like for her sons Belenda Cruise and Harmon Pier to make medical decisions as a team. Relationship to Patient adult children HCPOA: no   SUMMARY OF RECOMMENDATIONS  Code Status/Advance Care Planning: Full code we briefly discussed the concept of allowing natural death today, but Maria Parrish is unable to have any in-depth conversation at this time.     Code Status Orders        Start     Ordered   09/14/15 2309  Full code   Continuous     09/14/15 2308    Code Status History    Date Active Date Inactive Code Status Order ID Comments User Context   03/26/2015 12:55 AM 03/27/2015  6:37 PM Full Code [id number]  Delfina Redwood, MD Inpatient      Other Directives:None  Symptom Management:   per hospitalist.  Palliative Prophylaxis:   Frequent Pain Assessment and Turn Reposition  Additional Recommendations (Limitations, Scope, Preferences):  treat the treatable.   Psycho-social/Spiritual:  Support System: Adequate Desire for further Chaplaincy support:no Additional Recommendations: None at this time  Prognosis: Unable to determine, based on outcomes.   Discharge Planning: [place] for rehab with Palliative care service follow-up, recommended by PT. This is not been discussed with family members. I discussed rehab for a few weeks to regain strength and improve balance and mobility. Maria Parrish gives no response.   Chief Complaint/ Primary Diagnoses: Present on Admission:  . UTI (urinary tract infection) . Dementia . Subacute confusional state . Essential hypertension . Malnutrition of moderate degree ([place]) . Confusion  I have reviewed the medical record, interviewed the patient and family, and examined the patient. The following aspects are pertinent.  Past Medical History  Diagnosis Date  . Diabetes mellitus without complication ([place])   . Hypertension   . Hyperlipidemia   . Hypercalcemia   . Hyperparathyroidism ([place])   . Confusion   . Assistance needed for mobility     walks with walker  . Peripheral neuropathy ([place])  Social History   Social History  . Marital Status: Widowed    Spouse Name: N/A  . Number of Children: 2  . 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 Smoking status: Never Smoker   . Smokeless tobacco: None  . Alcohol Use: No  . Drug Use:  No  . Sexual Activity: Not Currently   Other Topics Concern  . None   Social History Narrative   Family History  Problem Relation Age of Onset  . Diabetes Father   . Heart attack Mother   . Colon cancer Neg Hx   . Hypertension Brother   . Hypertension Brother   . Diabetes Brother    Scheduled Meds: . cefTRIAXone (ROCEPHIN)  IV  1 g Intravenous Q24H  . enoxaparin (LOVENOX) injection  40 mg Subcutaneous Q24H  . insulin aspart  0-5 Units Subcutaneous QHS  . insulin aspart  2-6 Units Subcutaneous TID WC  . lisinopril  20 mg Oral Daily  . metFORMIN  500 mg Oral BID WC  . [START ON 09/17/2015] Vitamin D (Ergocalciferol)  50,000 Units Oral Q7 days   Continuous Infusions: . sodium chloride 100 mL/hr at 09/16/15 1701   PRN Meds:.acetaminophen **OR** acetaminophen, alum & mag hydroxide-simeth, bisacodyl, ondansetron **OR** ondansetron (ZOFRAN) IV, polyethylene glycol Medications Prior to Admission:  Prior to Admission medications   Medication Sig Start Date End Date Taking? Authorizing Provider  glipiZIDE (GLUCOTROL) 10 MG tablet Take 1 tablet (10 mg total) by mouth daily before breakfast. 09/09/15  Yes Cassandria Anger, MD  lisinopril (PRINIVIL,ZESTRIL) 20 MG tablet Take 1 tablet (20 mg total) by mouth daily. 03/27/15  Yes Kathie Dike, MD  metFORMIN (GLUCOPHAGE) 500 MG tablet Take 1 tablet (500 mg total) by mouth 2 (two) times daily. 03/27/15  Yes Kathie Dike, MD  Vitamin D, Ergocalciferol, (DRISDOL) 50000 units CAPS capsule Take 1 capsule (50,000 Units total) by mouth every 7 (seven) days. 09/09/15  Yes Cassandria Anger, MD   Allergies  Allergen Reactions  . Penicillins Other (See Comments)    Makes patient sore.  Has patient had a PCN reaction causing immediate rash, facial/tongue/throat swelling, SOB or lightheadedness with hypotension: No Has patient had a PCN reaction causing severe rash involving mucus membranes or skin necrosis: No Has patient had a PCN reaction  that required hospitalization No Has patient had a PCN reaction occurring within the last 10 years: No If all of the above answers are "NO", then may proceed with Cephalosporin use.   . Statins Other (See Comments)    Makes wrists and fingers ache    Review of Systems  Unable to perform ROS: Dementia    Physical Exam  Nursing note and vitals reviewed. Constitutional: No distress.  HENT:  Head: Normocephalic and atraumatic.  Respiratory: Effort normal. No respiratory distress.  GI: Soft. She exhibits no distension. There is no guarding.  Neurological: She is alert.  Marked delayed response to questions.  Skin: Skin is warm and dry.    Vital Signs: BP 170/89 mmHg  Pulse 79  Temp(Src) 98 F (36.7 C) (Oral)  Resp 18  Ht 5\' 6"  (1.676 m)  Wt 54 kg (119 lb 0.8 oz)  BMI 19.22 kg/m2  SpO2 98%  SpO2: SpO2: 98 % O2 Device:SpO2: 98 % O2 Flow Rate: .   IO: Intake/output summary: No intake or output data in the 24 hours ending 09/16/15 1842  LBM: Last  BM Date: 09/13/15 Baseline Weight: Weight: 56.7 kg (125 lb) Most recent weight: Weight: 54 kg (119 lb 0.8 oz)      Palliative Assessment/Data:  Flowsheet Rows        Most Recent Value   Intake Tab    Referral Department  Hospitalist   Unit at Time of Referral  Med/Surg Unit   Palliative Care Primary Diagnosis  Neurology   Date Notified  09/14/15   Palliative Care Type  New Palliative care   Reason for referral  Clarify Goals of Care   Date of Admission  09/14/15   Date first seen by Palliative Care  09/16/15   # of days Palliative referral response time  2 Day(s)   # of days IP prior to Palliative referral  0   Clinical Assessment    Palliative Performance Scale Score  30%   Pain Max last 24 hours  Not able to report   Pain Min Last 24 hours  Not able to report   Dyspnea Max Last 24 Hours  Not able to report   Dyspnea Min Last 24 hours  Not able to report   Psychosocial & Spiritual Assessment    Palliative Care  Outcomes    Patient/Family meeting held?  No   Palliative Care Outcomes  Provided psychosocial or spiritual support   Palliative Care follow-up planned  -- [follow up at [facility]]      Additional Data Reviewed:  CBC:    Component Value Date/Time   WBC 10.7* 09/14/2015 1948   HGB 12.3 09/14/2015 1948   HCT 34.9* 09/14/2015 1948   HCT 35.9 03/26/2015 0030   PLT 334 09/14/2015 1948   MCV 85.3 09/14/2015 1948   NEUTROABS 7.9* 09/14/2015 1948   LYMPHSABS 2.2 09/14/2015 1948   MONOABS 0.5 09/14/2015 1948   EOSABS 0.1 09/14/2015 1948   BASOSABS 0.0 09/14/2015 1948   Comprehensive Metabolic Panel:    Component Value Date/Time   NA 142 09/14/2015 1948   K 3.0* 09/14/2015 1948   CL 105 09/14/2015 1948   CO2 27 09/14/2015 1948   BUN 14 09/14/2015 1948   CREATININE 0.64 09/14/2015 1948   CREATININE 0.67 08/10/2015 1038   GLUCOSE 149* 09/14/2015 1948   CALCIUM 9.9 09/14/2015 1948   AST 21 09/14/2015 1948   ALT 15 09/14/2015 1948   ALKPHOS 43 09/14/2015 1948   BILITOT 0.8 09/14/2015 1948   PROT 7.6 09/14/2015 1948   ALBUMIN 3.8 09/14/2015 1948     Time In: 0910 Time Out: 1000 Time Total: 50 minutes Greater than 50%  of this time was spent counseling and coordinating care related to the above assessment and plan.  Signed by: Drue Novel, NP  Drue Novel, NP  09/16/2015, 6:42 PM  Please contact Palliative Medicine Team phone at [phone] for questions and concerns.

## 2015-09-16 NOTE — Evaluation (Signed)
Physical Therapy Evaluation Patient Details Name: Maria Parrish MRN: [id number] DOB: 1946-04-19 Today's Date: 09/16/2015   History of Present Illness  "Maria Parrish isa  70yo black female who comes to [facility] on 2/28 after being found on floor. Per ED note, son reports that the patient has been experiencing a gradaul funcitonal/cognitive decline over the past 5 months, essentially WC bound PTA. Pt  was at [facility] in September 2016 at which point pt was still driving and fully independent, ambulated 284ft s significant balance or other deficits and recommended OPPT. PMH: DM, HTN, UTI,.   Clinical Impression  At evaluation, pt is received semirecumbent in bed upon entry, with two long-time friends present. The pt is awake and agreeable to participate. No acute distress noted at this time. The pt is alert, pleasant, and initially somewhat  Conversational on topics regarding professional experiences in the 1970's and 1980's, but communication quickly shuts down when questioning is redirected toward more recent events.Pt is confused at times, and provides very little information about her prior functional status that is accurate. Throughout session, pt demonstrates difficulty following simple commands, appearing somewhat apraxic when asked to perform mobility tasks or correct postural abnormalities.  Pt grip strength is moderately weak and symmetrical; global strength as screened during functional mobility assessment presents with mild to moderate impairment, however mobility is more impaired by aforementioned motor apraxia. The the pt now requires physical assistance for bed mobility, transfers, and gait, whereas the patient was fully independent in these areas when assessed in Sept 2016. Without caregiver present, it is difficult to assess the transience of these deficits, but difficult to believe that they are baseline granted high functional status 6 months PTA.   Patient presenting with impairment of strength,  motor planning, balance, postural awareness, cognition, and activity tolerance, limiting ability to perform ADL and mobility tasks at  baseline level of function. Patient will benefit from skilled intervention to address the above impairments and limitations, in order to restore to prior level of function, improve patient safety upon discharge, and to decrease falls risk. If patient continues to demonstrate difficulty with following commands, she may be more appropriate for LTC, however that cannot be determined at this time without background information from the primary caregiver. DC recommendations will be updated as appropriate.        Follow Up Recommendations SNF    Equipment Recommendations  None recommended by PT    Recommendations for Other Services       Precautions / Restrictions Precautions Precautions: None Restrictions Weight Bearing Restrictions: No      Mobility  Bed Mobility Overal bed mobility: Needs Assistance Bed Mobility: Supine to Sit;Sit to Supine     Supine to sit: Mod assist Sit to supine: Mod assist   General bed mobility comments: Demonstrating something between apraxia and distraction, unclear at this time. inititates some of the movement and then freezes part way and in unable to complete the task without assistance.   Transfers Overall transfer level: Needs assistance Equipment used: None Transfers: Sit to/from Stand Sit to Stand: Mod assist         General transfer comment: Heavy Apraxia. Strong posterior lean, both in sitting and standing, with inability to correc tbeyond 30%. Remains standing for 5 minutes, knees stable, but leaning posteriorly with flat affect, unable to responde to cues for correction.   Ambulation/Gait Ambulation/Gait assistance:  (Unable. )              Stairs  Wheelchair Mobility    Modified Rankin (Stroke Patients Only)       Balance Overall balance assessment: History of Falls;Needs  assistance   Sitting balance-Leahy Scale: Zero   Postural control: Posterior lean Standing balance support: Bilateral upper extremity supported;During functional activity Standing balance-Leahy Scale: Zero                               Pertinent Vitals/Pain Pain Assessment: No/denies pain    Home Living Family/patient expects to be discharged to:: Unsure Living Arrangements: Children (Son, works 2nd shift. ) Available Help at Discharge: Family;Available PRN/intermittently;Friend(s) Type of Home: Mobile home Home Access: Stairs to enter Entrance Stairs-Rails: Right Entrance Stairs-Number of Steps: 3 Home Layout: One level Home Equipment: Cane - single point;Walker - 2 wheels (Friends in room deny that pt has a WC. )      Prior Function Level of Independence: Needs assistance         Comments: Per H&P Son reporting gradual cognitive/funcitonal decline over 5 months, unclear on details at this time.      Hand Dominance   Dominant Hand: Left    Extremity/Trunk Assessment   Upper Extremity Assessment: Generalized weakness;Overall [facility] for tasks assessed           Lower Extremity Assessment: Generalized weakness;Overall [facility] for tasks assessed      Cervical / Trunk Assessment:  (poor postural correction and kinesthetic awareness. )  Communication   Communication: Expressive difficulties (Mostly conversational on topics related to long term memory; communication shuts down when asked about more recent details of history, more anomia, more confusion. )  Cognition Arousal/Alertness: Awake/alert Behavior During Therapy: WFL for tasks assessed/performed Overall Cognitive Status: History of cognitive impairments - at baseline       Memory: Decreased short-term memory (poor historian for anything within the last few years. Otherwise conversational regarding workplace in the 1970's.)              General Comments      Exercises         Assessment/Plan    PT Assessment Patient needs continued PT services  PT Diagnosis Difficulty walking;Generalized weakness;Altered mental status   PT Problem List Decreased strength;Decreased activity tolerance;Decreased balance;Decreased mobility;Decreased coordination;Decreased cognition;Decreased knowledge of use of DME;Decreased safety awareness  PT Treatment Interventions DME instruction;Gait training;Stair training;Functional mobility training;Therapeutic activities;Patient/family education;Cognitive remediation;Balance training;Therapeutic exercise   PT Goals (Current goals can be found in the Care Plan section) Acute Rehab PT Goals PT Goal Formulation: Patient unable to participate in goal setting    Frequency Min 3X/week   Barriers to discharge Decreased caregiver support;Inaccessible home environment      Co-evaluation               End of Session Equipment Utilized During Treatment: Gait belt Activity Tolerance: Patient tolerated treatment well;Other (comment) (Limited by cognitive deficits. ) Patient left: in bed;with call bell/phone within reach;with bed alarm set;with family/visitor present Nurse Communication: Other (comment)         [id number] PT Time Calculation (min) (ACUTE ONLY): 21 min   Charges:   PT Evaluation $PT Eval Moderate Complexity: 1 Procedure PT Treatments $Therapeutic Activity: 8-22 mins   PT G Codes:       3:28 PM, October 08, 2015 Etta Grandchild, PT, DPT PRN Physical Therapist at [place] License # [id number] (wireless)  [phone] (mobile)

## 2015-09-16 NOTE — Consult Note (Signed)
Maumelle A. Merlene Laughter, MD     [email or web address]          Maria Parrish is an 70 y.o. female.   ASSESSMENT/PLAN: Acute mental status in the setting of lower urinary tract infection. Subacute to chronic progressive cognitive impairment/dementia. Subacute to chronic progressive gait impairment. Vitamin B12 deficiency.  RECOMMENDATION: Vitamin B12 replacement daily while hospitalized and then afterwards monthly. Physical and occupational therapy. Additional dementia labs for the following: RPR, ANA, sedimentation rate, C-reactive protein, homocystine and thyroid function tests. Discontinuation of spinal tap. The MRI findings are not that impressive for normal pressure hydrocephalus. Additionally, we need to compare her to her baseline and not when she is acutely ill with acute deterioration in cognition and gait.   The patient is a 70 year old white female who has had a significant deterioration in cognition and gait and overall function over the last year. We in fact saw the patient in the office about a week ago for the symptoms and did order testing which were in the process of being done. Unfortunately, the patient had significant deterioration in cognition and gait which resulted in her being taken to the hospital. The workup has been significant for UTI. She is currently being treated. The history is obtained from the son. He reports that she is really gotten worse or last week or so. She has deterioration in recognizing people, memory and overall function. He also reports that her handwriting and coordination has gotten worse. The review of systems Limited due to the impaired cognition.  GENERAL: Thin female in no acute distress.  HEENT: Supple. Atraumatic normocephalic.   ABDOMEN: soft  EXTREMITIES: No edema   BACK: Normal.  SKIN: Normal by inspection.    MENTAL STATUS: She is awake and alert. She does corporate with evaluation. Speech is nonsensical  although she does follow commands. She is only oriented to person.  CRANIAL NERVES: Pupils are equal, round and reactive to light; extra ocular movements are full, there is no significant nystagmus; visual fields are full; upper and lower facial muscles are normal in strength and symmetric, there is no flattening of the nasolabial folds; tongue is midline; uvula is midline; shoulder elevation is normal.  MOTOR: Normal tone, bulk and strength; no pronator drift.  COORDINATION: Left finger to nose is normal, right finger to nose is normal, No rest tremor; no intention tremor; no postural tremor; no bradykinesia.  REFLEXES: Deep tendon reflexes are symmetrical and normal. Babinski reflexes are flexor bilaterally.   SENSATION: Normal to pain.    Brain MRI is reviewed in person. 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 There is moderate global atrophy associated with moderate ventriculomegaly. No acute lesions are seen. No acute hemorrhages or infarcts.    Blood pressure 152/76, pulse 88, temperature 97.9 F (36.6 C), temperature source Oral, resp. rate 20, height 5\' 6"  (1.676 m), weight 54 kg (119 lb 0.8 oz), SpO2 96 %.  Past Medical History  Diagnosis Date  . Diabetes mellitus without complication ([place])   . Hypertension   . Hyperlipidemia   . Hypercalcemia   . Hyperparathyroidism ([place])   . Confusion   . Assistance needed for mobility     walks with walker  . Peripheral neuropathy [facility])     Past Surgical History  Procedure Laterality Date  . None    . Colonoscopy N/A 08/12/2015    Procedure: COLONOSCOPY;  Surgeon: Daneil Dolin, MD;  Location[facility];  Service: Endoscopy;  Laterality: N/A;  1245pm  . Esophagogastroduodenoscopy N/A 08/12/2015    Procedure: ESOPHAGOGASTRODUODENOSCOPY (EGD);  Surgeon: Daneil Dolin, MD;  Location: [facility];  Service: Endoscopy;  Laterality: N/A;    Family History  Problem Relation Age of Onset  .  Diabetes Father   . Heart attack Mother   . Colon cancer Neg Hx   . Hypertension Brother   . Hypertension Brother   . Diabetes Brother     Social History:  reports that she has never smoked. She does not have any smokeless tobacco history on file. She reports that she does not drink alcohol or use illicit drugs.  Allergies:  Allergies  Allergen Reactions  . Penicillins Other (See Comments)    Makes patient sore.  Has patient had a PCN reaction causing immediate rash, facial/tongue/throat swelling, SOB or lightheadedness with hypotension: No Has patient had a PCN reaction causing severe rash involving mucus membranes or skin necrosis: No Has patient had a PCN reaction that required hospitalization No Has patient had a PCN reaction occurring within the last 10 years: No If all of the above answers are "NO", then may proceed with Cephalosporin use.   . Statins Other (See Comments)    Makes wrists and fingers ache    Medications: Prior to Admission medications   Medication Sig Start Date End Date Taking? Authorizing Provider  glipiZIDE (GLUCOTROL) 10 MG tablet Take 1 tablet (10 mg total) by mouth daily before breakfast. 09/09/15  Yes Cassandria Anger, MD  lisinopril (PRINIVIL,ZESTRIL) 20 MG tablet Take 1 tablet (20 mg total) by mouth daily. 03/27/15  Yes Kathie Dike, MD  metFORMIN (GLUCOPHAGE) 500 MG tablet Take 1 tablet (500 mg total) by mouth 2 (two) times daily. 03/27/15  Yes Kathie Dike, MD  Vitamin D, Ergocalciferol, (DRISDOL) 50000 units CAPS capsule Take 1 capsule (50,000 Units total) by mouth every 7 (seven) days. 09/09/15  Yes Cassandria Anger, MD    Scheduled Meds: . cefTRIAXone (ROCEPHIN)  IV  1 g Intravenous Q24H  . enoxaparin (LOVENOX) injection  40 mg Subcutaneous Q24H  . insulin aspart  0-5 Units Subcutaneous QHS  . insulin aspart  2-6 Units Subcutaneous TID WC  . lisinopril  20 mg Oral Daily  . metFORMIN  500 mg Oral BID WC  . [START ON 09/17/2015]  Vitamin D (Ergocalciferol)  50,000 Units Oral Q7 days   Continuous Infusions: . sodium chloride 100 mL/hr at 09/16/15 1701   PRN Meds:.acetaminophen **OR** acetaminophen, alum & mag hydroxide-simeth, bisacodyl, ondansetron **OR** ondansetron (ZOFRAN) IV, polyethylene glycol     Results for orders placed or performed during the hospital encounter of 09/14/15 (from the past 48 hour(s))  Glucose, capillary     Status: Abnormal   Collection Time: 09/15/15  1:27 AM  Result Value Ref Range   Glucose-Capillary 105 (H) 65 - 99 mg/dL   Comment 1 Notify RN   Glucose, capillary     Status: Abnormal   Collection Time: 09/15/15  8:25 AM  Result Value Ref Range   Glucose-Capillary 132 (H) 65 - 99 mg/dL   Comment 1 Notify RN   Glucose, capillary     Status: Abnormal   Collection Time: 09/15/15 12:14 PM  Result Value Ref Range   Glucose-Capillary 138 (H) 65 - 99 mg/dL   Comment 1 Notify RN   Glucose, capillary     Status: Abnormal   Collection Time: 09/15/15  5:17 PM  Result Value Ref Range   Glucose-Capillary 168 (H) 65 - 99 mg/dL  Comment 1 Notify RN   Glucose, capillary     Status: Abnormal   Collection Time: 09/15/15  9:53 PM  Result Value Ref Range   Glucose-Capillary 157 (H) 65 - 99 mg/dL  Glucose, capillary     Status: Abnormal   Collection Time: 09/16/15  7:13 AM  Result Value Ref Range   Glucose-Capillary 146 (H) 65 - 99 mg/dL   Comment 1 Notify RN   Glucose, capillary     Status: Abnormal   Collection Time: 09/16/15 11:57 AM  Result Value Ref Range   Glucose-Capillary 165 (H) 65 - 99 mg/dL   Comment 1 Notify RN   Glucose, capillary     Status: Abnormal   Collection Time: 09/16/15  4:34 PM  Result Value Ref Range   Glucose-Capillary 178 (H) 65 - 99 mg/dL   Comment 1 Notify RN   Glucose, capillary     Status: Abnormal   Collection Time: 09/16/15 10:22 PM  Result Value Ref Range   Glucose-Capillary 142 (H) 65 - 99 mg/dL    Studies/Results:     Spiros Greenfeld A. Merlene Laughter,  M.D.  Diplomate, Tax adviser of Psychiatry and Neurology ( Neurology). 09/16/2015, 11:30 PM

## 2015-09-17 LAB — GLUCOSE, CAPILLARY
GLUCOSE-CAPILLARY: 180 mg/dL — AB (ref 65–99)
Glucose-Capillary: 156 mg/dL — ABNORMAL HIGH (ref 65–99)
Glucose-Capillary: 147 mg/dL — ABNORMAL HIGH (ref 65–99)
Glucose-Capillary: 139 mg/dL — ABNORMAL HIGH (ref 65–99)

## 2015-09-17 LAB — SEDIMENTATION RATE: Sed Rate: 70 mm/hr — ABNORMAL HIGH (ref 0–22)

## 2015-09-17 LAB — TSH: TSH: 1.951 u[IU]/mL (ref 0.350–4.500)

## 2015-09-17 LAB — VITAMIN D 25 HYDROXY (VIT D DEFICIENCY, FRACTURES): VIT D 25 HYDROXY: 21.2 ng/mL — AB (ref 30.0–100.0)

## 2015-09-17 LAB — PTH, INTACT AND CALCIUM
Calcium, Total (PTH): 9.2 mg/dL (ref 8.7–10.3)
PTH: 41 pg/mL (ref 15–65)

## 2015-09-17 LAB — CK TOTAL AND CKMB (NOT AT ARMC)
CK TOTAL: 123 U/L (ref 38–234)
CK, MB: 2.2 ng/mL (ref 0.5–5.0)
Relative Index: 1.8 (ref 0.0–2.5)

## 2015-09-17 MED ORDER — LABETALOL HCL 5 MG/ML IV SOLN
10.0000 mg | INTRAVENOUS | Status: DC | PRN
  Administered 2015-09-17 – 2015-09-19 (×4): 10 mg via INTRAVENOUS
  Filled 2015-09-17 (×3): qty 4

## 2015-09-17 NOTE — Clinical Social Work Placement (Addendum)
   CLINICAL SOCIAL WORK PLACEMENT  NOTE  Date:  09/17/2015  Patient Details  Name: CHRISANNE PINKERT MRN: [id number] Date of Birth: [birth year]  Clinical Social Work is seeking post-discharge placement for this patient at the [place] level of care (*CSW will initial, date and re-position this form in  chart as items are completed):  Yes   Patient/family provided with [place] Work Department's list of facilities offering this level of care within the geographic area requested by the patient (or if unable, by the patient's family).  Yes   Patient/family informed of their freedom to choose among providers that offer the needed level of care, that participate in Medicare, Medicaid or managed care program needed by the patient, have an available bed and are willing to accept the patient.  Yes   Patient/family informed of [facility]'s ownership interest in [facility] and [facility], as well as of the fact that they are under no obligation to receive care at these facilities.  PASRR submitted to EDS on 09/17/15     PASRR number received on 09/17/15     Existing PASRR number confirmed on       FL2 transmitted to all facilities in geographic area requested by pt/family on 09/17/15     FL2 transmitted to all facilities within larger geographic area on       Patient informed that his/her managed care company has contracts with or will negotiate with certain facilities, including the following:        Yes   Patient/family informed of bed offers received.  Patient chooses bed at [place] [facility]     Physician recommends and patient chooses bed at      Patient to be transferred to [facility] [place] on  .  Patient to be transferred to facility by       Patient family notified on   of transfer.  Name of family member notified:        PHYSICIAN Please prepare priority discharge summary, including medications, Please prepare prescriptions,  Please sign FL2     Additional Comment:   [facility] has offered the patient a bed. CSW has attempted to reach the son [name] at his cell phone number and work number. His work states that he left at 3. Greg planned to meet with CSW today at [facility] but is not at the hospital at this time. CSW unable to leave voicemail for [place]. Given that [facility] is the only facility that has offered a bed for the patient, CSW has asked the facility to make arrangements to accept the patient over the weekend. Greg's girlfriend was at bedside, [place] asked her to relay this information to [place].   If the patient is ready to DC over the weekend, the weekend CSW will need to contact Irven Shelling with the facility at [phone] for assistance with admission. Completed transport forms have been placed on the patient's chart. Report left for weekend CSW. Weekend CSW can be reached at [phone] or [phone]. 5-7 day LOG request submitted.    _______________________________________________ Rigoberto Noel, LCSW 09/17/2015, 4:41 PM

## 2015-09-17 NOTE — Progress Notes (Signed)
Patients BP is 201/94, paged on call MD to order PRN BP med, will follow any new orders received and continue to monitor.

## 2015-09-17 NOTE — NC FL2 (Signed)
[place] LEVEL OF CARE SCREENING TOOL     IDENTIFICATION  Patient Name: Maria Parrish Birthdate: 29-Jul-1945 Sex: female Admission Date (Current Location): 09/14/2015  [facility] and [place] Number:  [employer] and Address:  [employer] [address], [place]      Provider Number: [id number]  Attending Physician Name and Address:  Nita Sells, MD  Relative Name and Phone Number:       Current Level of Care: Hospital Recommended Level of Care: [place] Prior Approval Number:    Date Approved/Denied:   PASRR Number: [id number]  Discharge Plan: SNF    Current Diagnoses: Patient Active Problem List   Diagnosis Date Noted  . Palliative care encounter   . UTI (urinary tract infection) 09/14/2015  . Dementia 09/14/2015  . General weakness 09/14/2015  . Subacute confusional state 09/14/2015  . Non-insulin treated type 2 diabetes mellitus ([place]) 09/14/2015  . Confusion 09/14/2015  . Fall at home   . Elevated parathyroid hormone 09/09/2015  . Mucosal abnormality of stomach   . Diverticulosis of colon without hemorrhage   . Hypercalcemia 08/10/2015  . Goiter 08/10/2015  . Abnormal weight loss 07/28/2015  . Neuropathy ([place]) 03/26/2015  . Hypokalemia 03/26/2015  . Essential hypertension 03/26/2015  . Hyperlipidemia 03/26/2015  . Malnutrition of moderate degree ([place]) 03/26/2015    Orientation RESPIRATION BLADDER Height & Weight     Self, Time, Situation, Place  Normal Incontinent Weight: 54 kg (119 lb 0.8 oz) Height:  5\' 6"  (167.6 cm)  BEHAVIORAL SYMPTOMS/MOOD NEUROLOGICAL BOWEL NUTRITION STATUS   (NONE)  (NONE) Continent Diet (Carb Modified)  AMBULATORY STATUS COMMUNICATION OF NEEDS Skin   Extensive Assist Verbally Normal                       Personal Care Assistance Level of Assistance  Bathing, Feeding, Dressing Bathing Assistance: Limited assistance Feeding assistance:  Independent Dressing Assistance: Limited assistance     Functional Limitations Info  Sight, Hearing, Speech Sight Info: Adequate Hearing Info: Adequate Speech Info: Adequate    SPECIAL CARE FACTORS FREQUENCY  PT (By licensed PT), OT (By licensed OT)     PT Frequency: 5/week OT Frequency: 5/week            Contractures Contractures Info: Not present    Additional Factors Info  Code Status, Allergies Code Status Info: Full Allergies Info: Penicillins, statins           Current Medications (09/17/2015):  This is the current hospital active medication list Current Facility-Administered Medications  Medication Dose Route Frequency Provider Last Rate Last Dose  . 0.9 %  sodium chloride infusion   Intravenous Continuous Francine Graven, DO 100 mL/hr at 09/16/15 1701    . acetaminophen (TYLENOL) tablet 650 mg  650 mg Oral Q6H PRN Roney Jaffe, MD       Or  . acetaminophen (TYLENOL) suppository 650 mg  650 mg Rectal Q6H PRN Roney Jaffe, MD      . alum & mag hydroxide-simeth (MAALOX/MYLANTA) 200-200-20 MG/5ML suspension 30 mL  30 mL Oral Q6H PRN Roney Jaffe, MD      . bisacodyl (DULCOLAX) EC tablet 5 mg  5 mg Oral Daily PRN Roney Jaffe, MD      . cefTRIAXone (ROCEPHIN) 1 g in dextrose 5 % 50 mL IVPB  1 g Intravenous Q24H Francine Graven, DO 100 mL/hr at 09/16/15 2241 1 g at 09/16/15 2241  . enoxaparin (  LOVENOX) injection 40 mg  40 mg Subcutaneous Q24H Roney Jaffe, MD   40 mg at 09/16/15 2241  . folic acid (FOLVITE) tablet 1 mg  1 mg Oral Daily Phillips Odor, MD   1 mg at 09/17/15 1032  . insulin aspart (novoLOG) injection 0-5 Units  0-5 Units Subcutaneous QHS Roney Jaffe, MD   0 Units at 09/14/15 2315  . insulin aspart (novoLOG) injection 2-6 Units  2-6 Units Subcutaneous TID WC Roney Jaffe, MD   2 Units at 09/15/15 0800  . labetalol (NORMODYNE,TRANDATE) injection 10 mg  10 mg Intravenous Q2H PRN Orvan Falconer, MD   10 mg at 09/17/15 0617  . lisinopril  (PRINIVIL,ZESTRIL) tablet 20 mg  20 mg Oral Daily Roney Jaffe, MD   20 mg at 09/17/15 1032  . metFORMIN (GLUCOPHAGE) tablet 500 mg  500 mg Oral BID WC Roney Jaffe, MD   500 mg at 09/17/15 1032  . ondansetron (ZOFRAN) tablet 4 mg  4 mg Oral Q6H PRN Roney Jaffe, MD       Or  . ondansetron [facility]) injection 4 mg  4 mg Intravenous Q6H PRN Roney Jaffe, MD      . polyethylene glycol (MIRALAX / GLYCOLAX) packet 17 g  17 g Oral Daily PRN Roney Jaffe, MD      . Vitamin D (Ergocalciferol) (DRISDOL) capsule 50,000 Units  50,000 Units Oral Q7 days Roney Jaffe, MD   50,000 Units at 09/17/15 1033     Discharge Medications: Please see discharge summary for a list of discharge medications.  Relevant Imaging Results:  Relevant Lab Results:   Additional Information SSN: [id number]  Rigoberto Noel, LCSW

## 2015-09-17 NOTE — Care Management Important Message (Signed)
Important Message  Patient Details  Name: CLEON MENARD MRN: [id number] Date of Birth: [birth year]   Medicare Important Message Given:  Yes    Sherald Barge, RN 09/17/2015, 4:36 PM

## 2015-09-17 NOTE — Progress Notes (Signed)
Pharmacy Note:  Initial antibiotics for Rocephin ordered by EDP for UTI.  Estimated Creatinine Clearance: 56.6 mL/min (by C-G formula based on Cr of 0.64).   Allergies  Allergen Reactions  . Penicillins Other (See Comments)    Makes patient sore.  Has patient had a PCN reaction causing immediate rash, facial/tongue/throat swelling, SOB or lightheadedness with hypotension: No Has patient had a PCN reaction causing severe rash involving mucus membranes or skin necrosis: No Has patient had a PCN reaction that required hospitalization No Has patient had a PCN reaction occurring within the last 10 years: No If all of the above answers are "NO", then may proceed with Cephalosporin use.   . Statins Other (See Comments)    Makes wrists and fingers ache    Filed Vitals:   09/17/15 0540 09/17/15 0653  BP: 201/94 190/85  Pulse: 85   Temp: 98.3 F (36.8 C)   Resp:      Anti-infectives    Start     Dose/Rate Route Frequency Ordered Stop   09/14/15 2200  cefTRIAXone (ROCEPHIN) 1 g in dextrose 5 % 50 mL IVPB     1 g 100 mL/hr over 30 Minutes Intravenous Every 24 hours 09/14/15 2117       PCN allergy noted, has received Rocephin previous admission.  Plan: UC is negative.  Cont rocephin 1 gm IV q24 hours. Consider change to po to complete course.  Pharmacy will sign off.  Please reconsult as needed.  Beverlee Nims, [facility] 09/17/2015 9:31 AM

## 2015-09-17 NOTE — Clinical Social Work Note (Signed)
Clinical Social Work Assessment  Patient Details  Name: Maria Parrish MRN: [id number] Date of Birth: September 18, 1945  Date of referral:  09/17/15               Reason for consult:  Discharge Planning, Facility Placement                Permission sought to share information with:  Facility Sport and exercise psychologist, Family Supports Permission granted to share information::  Yes, Verbal Permission Granted  Name::     SNFs  Agency::  [facility]  Relationship::     Contact Information:     Housing/Transportation Living arrangements for the past 2 months:  [place] of Information:  Patient Patient Interpreter Needed:  None Criminal Activity/Legal Involvement Pertinent to Current Situation/Hospitalization:  No - Comment as needed Significant Relationships:  Adult Children Lives with:  Self Do you feel safe going back to the place where you live?  Yes Need for family participation in patient care:  Yes (Comment)  Care giving concerns: Patient and son agree with recommendation for SNF.    Social Worker assessment / plan:  CSW met with patient at bedside. The patient is somewhat confused and gives CSW permission to speak with her son Maria Parrish to complete assessment. Maria Parrish states that he is in agreement with SNF recommendation and would like to keep the patient in [facility] if possible. CSW has made SNF referrals and will followup with patient's son later today regarding bed offers. CSW explained SNF search/placement process and answered the son's questions.   Employment status:  Retired Nurse, adult PT Recommendations:  [place] / Referral to community resources:  [place]  Patient/Family's Response to care: The patient and patient's family appear happy with the care the patient has received.  Patient/Family's Understanding of and Emotional Response to Diagnosis, Current Treatment, and Prognosis:  The  patient's son appears to have a good understanding of the reason for the patient's admission and post DC needs.  Emotional Assessment Appearance:  Appears stated age Attitude/Demeanor/Rapport:  Other (Patient appears somewhat confused and is limited in her engagement with CSW.) Affect (typically observed):  Accepting, Appropriate, Calm Orientation:  Oriented to Self, Oriented to Place Alcohol / Substance use:  Never Used Psych involvement (Current and /or in the community):  No (Comment)  Discharge Needs  Concerns to be addressed:  Discharge Planning Concerns Readmission within the last 30 days:  No Current discharge risk:  Chronically ill, Physical Impairment Barriers to Discharge:  Continued Medical Work up   Fredderick Phenix B, LCSW 09/17/2015, 11:52 AM

## 2015-09-17 NOTE — Progress Notes (Signed)
Patients BP has been elevated, reviewed home med list and patient takes lisinopril 20 mg, paged on call MD, will follow any new orders received and continue to monitor the patient.

## 2015-09-17 NOTE — Progress Notes (Signed)
Maria Parrish [id number] DOB: [birth year] DOA: 09/14/2015 PCP: Shade Flood, MD  Brief narrative: 60 ? hyperparathryoidism ? 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 Goiter HTn Recent wght loss-Colo=diverticulosis/Endo 08/12/15 only hiatal hernia/gastric polyp  Patient admitted after episodic weakness, fall 2/28 Overall debility X 12 months Patient confused after fall and brought the emergency room CT head chest x-ray showed only microvascular changes Urinaanlysis not impressive for pyelonephritis   Past medical history-As per Problem list Chart reviewed as below-   Consultants:  None  Procedures:  None  Antibiotics:  None   Subjective   Confused still Some progress but echolalia.  Cannot stay on track Family not in room Needed to be fed today Note neurology input    Objective    Interim History:   Telemetry: Sinus rhythm   Objective: Filed Vitals:   09/17/15 0122 09/17/15 0540 09/17/15 0653 09/17/15 1300  BP: 209/79 201/94 190/85 178/79  Pulse:  85  77  Temp:  98.3 F (36.8 C)  97.8 F (36.6 C)  TempSrc:  Oral  Oral  Resp:    18  Height:      Weight:      SpO2:  98%  100%    Intake/Output Summary (Last 24 hours) at 09/17/15 1700 Last data filed at 09/17/15 1300  Gross per 24 hour  Intake    720 ml  Output    100 ml  Net    620 ml    Exam:  General: EOMI NCAT Cardiovascular: S1-S2 no murmur rub or gallop Respiratory: Clinically clear no added sound Can do finger-nose-finger but then get confused and keeps repeating back the same workds  Data Reviewed: Basic Metabolic Panel:  Recent Labs Lab 09/14/15 1948 09/14/15 1950 09/16/15 0559  NA 142  --   --   K 3.0*  --   --   CL 105  --   --   CO2 27  --   --   GLUCOSE 149*  --   --   BUN 14  --   --   CREATININE 0.64  --   --   CALCIUM 9.9  --  9.2  MG  --  1.4*  --    Liver Function Tests:  Recent Labs Lab 09/14/15 1948  AST 21  ALT 15  ALKPHOS 43  BILITOT 0.8    PROT 7.6  ALBUMIN 3.8   No results for input(s): LIPASE, AMYLASE in the last 168 hours. No results for input(s): AMMONIA in the last 168 hours. CBC:  Recent Labs Lab 09/14/15 1948  WBC 10.7*  NEUTROABS 7.9*  HGB 12.3  HCT 34.9*  MCV 85.3  PLT 334   Cardiac Enzymes:  Recent Labs Lab 09/14/15 1948 09/17/15 0624  CKTOTAL 384* 123  CKMB  --  2.2  TROPONINI 0.03  --    BNP: Invalid input(s): POCBNP CBG:  Recent Labs Lab 09/16/15 1634 09/16/15 2222 09/17/15 0739 09/17/15 1136 09/17/15 1631  GLUCAP 178* 142* 156* 147* 139*    Recent Results (from the past 240 hour(s))  Urine culture     Status: None   Collection Time: 09/14/15  8:00 PM  Result Value Ref Range Status   Specimen Description URINE, CATHETERIZED  Final   Special Requests NONE  Final   Culture   Final    NO GROWTH 1 DAY Performed at [facility]    Report Status 09/16/2015 FINAL  Final     Studies:  All Imaging reviewed and is as per above notation   Scheduled Meds: . cefTRIAXone (ROCEPHIN)  IV  1 g Intravenous Q24H  . enoxaparin (LOVENOX) injection  40 mg Subcutaneous Q24H  . folic acid  1 mg Oral Daily  . insulin aspart  0-5 Units Subcutaneous QHS  . insulin aspart  2-6 Units Subcutaneous TID WC  . lisinopril  20 mg Oral Daily  . metFORMIN  500 mg Oral BID WC  . Vitamin D (Ergocalciferol)  50,000 Units Oral Q7 days   Continuous Infusions: . sodium chloride 100 mL/hr at 09/17/15 1455     Assessment/Plan:  1. Toxic metabolic encephalopathy-suspect 2/2 to B12 def.  MRI suggestive of NPH but neurology felt not really impressive.  Did discuss.  B12 level 158.  Give 1000 microgram B12 now. Also start folic acid 1 mg daily. Still some cognitive slowing.   Urine cult NG x 1 day and after being given 3 x doses Abx, would d/c without further evidence infection.  Await RPR, B12, homocysteine, ANA IFA.  Sed rate is elevated-unclear what to make of  this 2. Hyperparathyroidism-under care of Dr. Dorris Fetch.  Rpt PTH and Calcium are apporpriate Was rx with Vit D as this was low as OP 3. 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  cotninue Metfromin 500 bid, SSI-sugars 132-138 4. Htn-Lisinopril 20 daily   No family + currently Called son at work and home.  No answer FULL CODE  Inpatient-liekly d/c home and complete work-up as OP   Verneita Griffes, MD  [facility] Pager [phone] 09/17/2015, 5:00 PM    LOS: 3 days

## 2015-09-17 NOTE — Progress Notes (Signed)
Patient ID: Maria Parrish, female   DOB: 05/24/1946, 70 y.o.   MRN: [id number]   [place] A. Merlene Laughter, MD     [email or web address]          Maria Parrish is an 70 y.o. female.   Assessment/Plan: Acute mental status in the setting of lower urinary tract infection. Subacute to chronic progressive cognitive impairment/dementia. Subacute to chronic progressive gait impairment. Vitamin B12 deficiency.  RECOMMENDATION: Vitamin B12 replacement daily while hospitalized and then afterwards monthly. Physical and occupational therapy. Additional dementia labs for the following: RPR, ANA, sedimentation rate, C-reactive protein, homocystine and thyroid function tests. Discontinuation of spinal tap. The MRI findings are not that impressive for normal pressure hydrocephalus. Additionally, we need to compare her to her baseline and not when she is acutely ill with acute deterioration in cognition and gait.   Patient is much improved today. She is more lucid and coherent.   GENERAL: Thin female in no acute distress.  HEENT: Supple. Atraumatic normocephalic.   ABDOMEN: soft  EXTREMITIES: No edema   BACK: Normal.  SKIN: Normal by inspection.   MENTAL STATUS: She is awake and alert. She knows the season the hospital. She is more engaging. She says the year is 2017 and can recognize her family in the room today.  CRANIAL NERVES: Pupils are equal, round and reactive to light; extra ocular movements are full, there is no significant nystagmus; visual fields are full; upper and lower facial muscles are normal in strength and symmetric, there is no flattening of the nasolabial folds; tongue is midline; uvula is midline; shoulder elevation is normal.  MOTOR: Normal tone, bulk and strength; no pronator drift.  COORDINATION: Left finger to nose is normal, right finger to nose is normal, No rest tremor; no intention tremor; no postural tremor; no bradykinesia.  SENSATION: Normal to  pain.       Objective: Vital signs in last 24 hours: Temp:  [97.8 F (36.6 C)-98.3 F (36.8 C)] 97.8 F (36.6 C) (03/03 1300) Pulse Rate:  [77-88] 77 (03/03 1300) Resp:  [18-20] 18 (03/03 1300) BP: (178-209)/(79-94) 178/79 mmHg (03/03 1300) SpO2:  [96 %-100 %] 100 % (03/03 1300)  Intake/Output from previous day: 03/02 0701 - 03/03 0700 In: 720 [P.O.:720] Out: 100 [Urine:100] Intake/Output this shift: Total I/O In: 480 [P.O.:480] Out: -  Nutritional status: Diet Carb Modified Fluid consistency:: Thin; Room service appropriate?: Yes   Lab Results: Results for orders placed or performed during the hospital encounter of 09/14/15 (from the past 48 hour(s))  Glucose, capillary     Status: Abnormal   Collection Time: 09/15/15  5:17 PM  Result Value Ref Range   Glucose-Capillary 168 (H) 65 - 99 mg/dL   Comment 1 Notify RN   Glucose, capillary     Status: Abnormal   Collection Time: 09/15/15  9:53 PM  Result Value Ref Range   Glucose-Capillary 157 (H) 65 - 99 mg/dL  PTH, intact and calcium     Status: None   Collection Time: 09/16/15  5:59 AM  Result Value Ref Range   PTH 41 15 - 65 pg/mL   Calcium, Total (PTH) 9.2 8.7 - 10.3 mg/dL   PTH Comment     Comment: (NOTE) Interpretation                 Intact PTH    Calcium                                (  pg/mL)      (mg/dL) Normal                          15 - 65     8.6 - 10.2 Primary Hyperparathyroidism         >65          >10.2 Secondary Hyperparathyroidism       >65          <10.2 Non-Parathyroid Hypercalcemia       <65          >10.2 Hypoparathyroidism                  <15          < 8.6 Non-Parathyroid Hypocalcemia    15 - 65          < 8.6 Performed At: [facility] [place], [place] [id number] Lindon Romp MD [id number]   VITAMIN D 25 Hydroxy (Vit-D Deficiency, Fractures)     Status: Abnormal   Collection Time: 09/16/15  5:59 AM  Result Value Ref Range   Vit D, 25-Hydroxy 21.2 (L)  30.0 - 100.0 ng/mL    Comment: (NOTE) Vitamin D deficiency has been defined by the Institute of Medicine and an Endocrine Society practice guideline as a level of serum 25-OH vitamin D less than 20 ng/mL (1,2). The Endocrine Society went on to further define vitamin D insufficiency as a level between 21 and 29 ng/mL (2). 1. IOM (Institute of Medicine). 2010. Dietary reference   intakes for calcium and D. [place]: The   [employer]. 2. Holick MF, [place] , Bischoff-Ferrari HA, et al.   Evaluation, treatment, and prevention of vitamin D   deficiency: an Endocrine Society clinical practice   guideline. JCEM. 2011 Jul; 96(7):1911-30. Performed At: [facility] [place], [place] [id number] Lindon Romp MD [id number]   Glucose, capillary     Status: Abnormal   Collection Time: 09/16/15  7:13 AM  Result Value Ref Range   Glucose-Capillary 146 (H) 65 - 99 mg/dL   Comment 1 Notify RN   Glucose, capillary     Status: Abnormal   Collection Time: 09/16/15 11:57 AM  Result Value Ref Range   Glucose-Capillary 165 (H) 65 - 99 mg/dL   Comment 1 Notify RN   Glucose, capillary     Status: Abnormal   Collection Time: 09/16/15  4:34 PM  Result Value Ref Range   Glucose-Capillary 178 (H) 65 - 99 mg/dL   Comment 1 Notify RN   Glucose, capillary     Status: Abnormal   Collection Time: 09/16/15 10:22 PM  Result Value Ref Range   Glucose-Capillary 142 (H) 65 - 99 mg/dL  TSH     Status: None   Collection Time: 09/17/15  6:24 AM  Result Value Ref Range   TSH 1.951 0.350 - 4.500 uIU/mL  Sedimentation rate     Status: Abnormal   Collection Time: 09/17/15  6:24 AM  Result Value Ref Range   Sed Rate 70 (H) 0 - 22 mm/hr  CK total and CKMB (cardiac)not at [facility]     Status: None   Collection Time: 09/17/15  6:24 AM  Result Value Ref Range   Total CK 123 38 - 234 U/L   CK, MB 2.2 0.5 - 5.0 ng/mL   Relative Index 1.8 0.0 - 2.5    Comment: Performed at  [facility]  [facility]  Glucose, capillary     Status: Abnormal   Collection Time: 09/17/15  7:39 AM  Result Value Ref Range   Glucose-Capillary 156 (H) 65 - 99 mg/dL   Comment 1 Notify RN    Comment 2 Document in Chart   Glucose, capillary     Status: Abnormal   Collection Time: 09/17/15 11:36 AM  Result Value Ref Range   Glucose-Capillary 147 (H) 65 - 99 mg/dL   Comment 1 Notify RN    Comment 2 Document in Chart     Lipid Panel No results for input(s): CHOL, TRIG, HDL, CHOLHDL, VLDL, LDLCALC in the last 72 hours.  Studies/Results:   Medications:  Scheduled Meds: . cefTRIAXone (ROCEPHIN)  IV  1 g Intravenous Q24H  . enoxaparin (LOVENOX) injection  40 mg Subcutaneous Q24H  . folic acid  1 mg Oral Daily  . insulin aspart  0-5 Units Subcutaneous QHS  . insulin aspart  2-6 Units Subcutaneous TID WC  . lisinopril  20 mg Oral Daily  . metFORMIN  500 mg Oral BID WC  . Vitamin D (Ergocalciferol)  50,000 Units Oral Q7 days   Continuous Infusions: . sodium chloride 100 mL/hr at 09/17/15 1455   PRN Meds:.acetaminophen **OR** acetaminophen, alum & mag hydroxide-simeth, bisacodyl, labetalol, ondansetron **OR** ondansetron (ZOFRAN) IV, polyethylene glycol     LOS: 3 days   Krew Hortman A. Merlene Laughter, M.D.  Diplomate, Tax adviser of Psychiatry and Neurology ( Neurology).

## 2015-09-18 LAB — GLUCOSE, CAPILLARY
GLUCOSE-CAPILLARY: 152 mg/dL — AB (ref 65–99)
GLUCOSE-CAPILLARY: 142 mg/dL — AB (ref 65–99)
GLUCOSE-CAPILLARY: 157 mg/dL — AB (ref 65–99)
Glucose-Capillary: 184 mg/dL — ABNORMAL HIGH (ref 65–99)

## 2015-09-18 LAB — CBC
HCT: 29.6 % — ABNORMAL LOW (ref 36.0–46.0)
HEMOGLOBIN: 10.7 g/dL — AB (ref 12.0–15.0)
MCH: 30.7 pg (ref 26.0–34.0)
MCHC: 36.1 g/dL — AB (ref 30.0–36.0)
MCV: 85.1 fL (ref 78.0–100.0)
PLATELETS: 261 10*3/uL (ref 150–400)
RBC: 3.48 MIL/uL — AB (ref 3.87–5.11)
RDW: 12.6 % (ref 11.5–15.5)
WBC: 5.8 10*3/uL (ref 4.0–10.5)

## 2015-09-18 LAB — AMMONIA: AMMONIA: 26 umol/L (ref 9–35)

## 2015-09-18 LAB — RPR: RPR Ser Ql: NONREACTIVE

## 2015-09-18 NOTE — Progress Notes (Signed)
Notified Dr. Verlon Au of pt's BP via pager.

## 2015-09-18 NOTE — Progress Notes (Addendum)
Clinical Social Work  CSW received call from RN reporting that family was trying to get in touch with [facility] to complete paperwork for admission. Per chart review, pt should be ready to DC on 09/19/15. [facility]'s main number is not in service. CSW contacted admissions (Debbie) and left a message and is awaiting a return call. CSW will leave handoff for weekend staff for anticipated DC tomorrow.  Sindy Messing, [place] Weekend Coverage [phone]  Addendum 325 Admissions from [facility] returned call and stated they are aware of admission and can do admission paperwork tomorrow if pt is stable. CSW to contact admissions at [place] at [phone] if medically stable on 09/19/15.

## 2015-09-18 NOTE — Plan of Care (Signed)
Problem: Tissue Perfusion: Goal: Risk factors for ineffective tissue perfusion will decrease Outcome: Progressing Pt taking Lovenox.  Problem: Activity: Goal: Risk for activity intolerance will decrease Outcome: Progressing Pt has ambulated to the bathroom only.

## 2015-09-18 NOTE — Progress Notes (Signed)
Maria Parrish [id number] DOB: 12-01-1945 DOA: 09/14/2015 PCP: Shade Flood, MD  Brief narrative: 12 ? hyperparathryoidism ? 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 Goiter HTn Recent wght loss-Colo=diverticulosis/Endo 08/12/15 only hiatal hernia/gastric polyp  Patient admitted after episodic weakness, fall 2/28 Overall debility X 12 months Patient confused after fall and brought the emergency room CT head chest x-ray showed only microvascular changes Urinanlysis not impressive for pyelonephritis   Past medical history-As per Problem list Chart reviewed as below-   Consultants:  None  Procedures:  None  Antibiotics:  None   Subjective   Less confused, able to recognize sister Able to state her date of birth Able to state that she is 72 years older than the sister No overt complaint Has some recent recall deficits however No nausea no vomiting no chest pain no other issues    Objective    Interim History:   Telemetry: Sinus rhythm   Objective: Filed Vitals:   09/17/15 1300 09/17/15 2133 09/17/15 2349 09/18/15 0614  BP: 178/79 190/87 184/81 208/83  Pulse: 77 85 78 74  Temp: 97.8 F (36.6 C) 99.1 F (37.3 C)  97.9 F (36.6 C)  TempSrc: Oral Oral  Oral  Resp: 18 20 18 18   Height:      Weight:      SpO2: 100% 98% 98% 98%    Intake/Output Summary (Last 24 hours) at 09/18/15 1023 Last data filed at 09/18/15 0319  Gross per 24 hour  Intake 6711.67 ml  Output      0 ml  Net 6711.67 ml    Exam:  General: EOMI NCAT Cardiovascular: S1-S2 no murmur rub or gallop Respiratory: Clinically clear no added sound Can do finger-nose-finger  Power 5/5 ? [employer]  Data Reviewed: Basic Metabolic Panel:  Recent Labs Lab 09/14/15 1948 09/14/15 1950 09/16/15 0559  NA 142  --   --   K 3.0*  --   --   CL 105  --   --   CO2 27  --   --   GLUCOSE 149*  --   --   BUN 14  --   --   CREATININE 0.64  --   --   CALCIUM 9.9  --  9.2  MG  --  1.4*   --    Liver Function Tests:  Recent Labs Lab 09/14/15 1948  AST 21  ALT 15  ALKPHOS 43  BILITOT 0.8  PROT 7.6  ALBUMIN 3.8   No results for input(s): LIPASE, AMYLASE in the last 168 hours. No results for input(s): AMMONIA in the last 168 hours. CBC:  Recent Labs Lab 09/14/15 1948 09/18/15 0640  WBC 10.7* 5.8  NEUTROABS 7.9*  --   HGB 12.3 10.7*  HCT 34.9* 29.6*  MCV 85.3 85.1  PLT 334 261   Cardiac Enzymes:  Recent Labs Lab 09/14/15 1948 09/17/15 0624  CKTOTAL 384* 123  CKMB  --  2.2  TROPONINI 0.03  --    BNP: Invalid input(s): POCBNP CBG:  Recent Labs Lab 09/17/15 0739 09/17/15 1136 09/17/15 1631 09/17/15 2043 09/18/15 0750  GLUCAP 156* 147* 139* 180* 152*    Recent Results (from the past 240 hour(s))  Urine culture     Status: None   Collection Time: 09/14/15  8:00 PM  Result Value Ref Range Status   Specimen Description URINE, CATHETERIZED  Final   Special Requests NONE  Final   Culture   Final    NO GROWTH 1  DAY Performed at [facility]    Report Status 09/16/2015 FINAL  Final     Studies:              All Imaging reviewed and is as per above notation   Scheduled Meds: . enoxaparin (LOVENOX) injection  40 mg Subcutaneous Q24H  . folic acid  1 mg Oral Daily  . insulin aspart  0-5 Units Subcutaneous QHS  . insulin aspart  2-6 Units Subcutaneous TID WC  . lisinopril  20 mg Oral Daily  . metFORMIN  500 mg Oral BID WC  . Vitamin D (Ergocalciferol)  50,000 Units Oral Q7 days   Continuous Infusions: . sodium chloride 100 mL/hr at 09/18/15 0105     Assessment/Plan:  1. Toxic metabolic encephalopathy-suspect 2/2 to B12 def.  MRI suggestive of NPH but neurology felt not really impressive. B12 level 158. Continue 1000 microgram B12 weekly. Also start folic acid 1 mg daily. Still some cognitive slowing.   Urine cult NG x 1 day and after being given 3 x doses Abx, would d/c without further evidence infection.  Await RPR, B12,  homocysteine, ANA IFA.  Sed rate is elevated-unclear what to make of this--outpatient follow-up with neurologist scheduled already 10/04/15 and can determine then if need for LP to make a prognostic decision regarding shunt placement versus not although this seems unlikely. Given she has a little bit of flapping today I will get an ammonia with next lab work 2. Hyperparathyroidism-under care of Dr. Dorris Fetch.  Rpt PTH and Calcium are apporpriate Was rx with Vit D as this was low as OP 3. 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  cotninue Metfromin 500 bid, SSI-sugars 152-180 4. Htn-Lisinopril 20 daily   Discussed with son and rest of family at the bedside FULL CODE  Inpatient-liekly d/c 09/19/15 to skilled nursing facility and close follow-up with neurologist as an outpatient  Verneita Griffes, MD  [facility] Pager [phone] 09/18/2015, 10:23 AM    LOS: 4 days

## 2015-09-19 LAB — COMPREHENSIVE METABOLIC PANEL
ALT: 29 U/L (ref 14–54)
AST: 25 U/L (ref 15–41)
Albumin: 3 g/dL — ABNORMAL LOW (ref 3.5–5.0)
Alkaline Phosphatase: 43 U/L (ref 38–126)
Anion gap: 7 (ref 5–15)
BUN: 9 mg/dL (ref 6–20)
CHLORIDE: 107 mmol/L (ref 101–111)
CO2: 26 mmol/L (ref 22–32)
CREATININE: 0.41 mg/dL — AB (ref 0.44–1.00)
Calcium: 9.1 mg/dL (ref 8.9–10.3)
Glucose, Bld: 153 mg/dL — ABNORMAL HIGH (ref 65–99)
POTASSIUM: 3.1 mmol/L — AB (ref 3.5–5.1)
SODIUM: 140 mmol/L (ref 135–145)
Total Bilirubin: 0.5 mg/dL (ref 0.3–1.2)
Total Protein: 6.3 g/dL — ABNORMAL LOW (ref 6.5–8.1)

## 2015-09-19 LAB — GLUCOSE, CAPILLARY
GLUCOSE-CAPILLARY: 138 mg/dL — AB (ref 65–99)
GLUCOSE-CAPILLARY: 144 mg/dL — AB (ref 65–99)
GLUCOSE-CAPILLARY: 166 mg/dL — AB (ref 65–99)
Glucose-Capillary: 151 mg/dL — ABNORMAL HIGH (ref 65–99)

## 2015-09-19 LAB — HOMOCYSTEINE: Homocysteine: 8.9 umol/L (ref 0.0–15.0)

## 2015-09-19 MED ORDER — CYANOCOBALAMIN 1000 MCG/ML IJ SOLN: 1000.0000 ug | Freq: Once | INTRAMUSCULAR | Status: DC

## 2015-09-19 MED ORDER — POLYETHYLENE GLYCOL 3350 17 G PO PACK: 17.0000 g | PACK | Freq: Every day | ORAL | Status: DC | PRN

## 2015-09-19 MED ORDER — FOLIC ACID 1 MG PO TABS: 1.0000 mg | ORAL_TABLET | Freq: Every day | ORAL | Status: DC

## 2015-09-19 NOTE — Discharge Summary (Signed)
Physician Discharge Summary  Maria Parrish [id number] DOB: 1946-03-24 DOA: 09/14/2015  PCP: Shade Flood, MD  Admit date: 09/14/2015 Discharge date: 09/19/2015  Time spent: 35 minutes  Recommendations for Outpatient Follow-up:  1. Patient should have weekly B12 injection for the next 3 weeks and B12 level should be checked as an outpatient 2. Please follow-up homocysteine levels as well as ANA and IFA were pending at time of discharge 3. Recommend close follow-up with [facility] neurology for workup of dementia and consideration should be given for lumbar puncture if no improvement as normal pressure hydrocephalus may still be on the differential 4. Should have an interval follow-up as well with [facility] for her hyper- parathyroidism   Discharge Diagnoses:  Active Problems:   Essential hypertension   Malnutrition of moderate degree (HCC)   UTI (urinary tract infection)   Dementia   General weakness   Subacute confusional state   Non-insulin treated type 2 diabetes mellitus ([place])   Confusion   Fall at home   Palliative care encounter   Discharge Condition: Fair  Diet recommendation: Heart healthy low-salt  Filed Weights   09/14/15 1807 09/14/15 2305  Weight: 56.7 kg (125 lb) 54 kg (119 lb 0.8 oz)    History of present illness:  40 ? hyperparathryoidism ? 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 Goiter HTn Recent wght loss-Colo=diverticulosis/Endo 08/12/15 only hiatal hernia/gastric polyp  Patient admitted after episodic weakness, fall 2/28 Overall debility X 12 months Patient confused after fall and brought the emergency room CT head chest x-ray showed only microvascular changes Urinanlysis not impressive for pyelonephritis  Hospital Course:  Assessment/Plan:  1. Toxic metabolic encephalopathy-suspect 2/2 to B12 def. MRI suggestive of NPH but neurology felt not really impressive. B12 level 158. Continue 1000 microgram B12 weekly. Also start folic  acid 1 mg daily. Still some cognitive slowing. Urine cult NG x 1 day and after being given 3 x doses Abx, would d/c without further evidence infection. Await RPR, B12, homocysteine, ANA IFA. Sed rate is elevated-unclear what to make of this--outpatient follow-up with neurologist scheduled already 10/04/15 and can determine then if need for LP to make a prognostic decision regarding shunt placement versus not although this seems unlikely.  2. Hyperparathyroidism-under care of Dr. Dorris Fetch. Rpt PTH and Calcium are apporpriate Was rx with Vit D as this was low as OP 3. 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 cotninue Metfromin 500 bid, SSI-sugars 152-180 4. Htn-Lisinopril 20 daily 5. Tremor-seems new since admit.  NO focal other deficit, ammonia wnl.  Needs furthe rOP work-up Dr. Merlene Laughter  Procedures:  MRI, CT  Consultations:  Neurology  Discharge Exam: Filed Vitals:   09/18/15 2100 09/19/15 0629  BP: 185/76 183/100  Pulse: 81 79  Temp: 98.5 F (36.9 C) 98.4 F (36.9 C)  Resp: 18 18    General: Alert oriented pleasant no apparent distress  Mental status rapidly improving.  Much moreengaging and talkative right now.  No n/v/cp Cardiovascular: S1-S2 no murmur rub or gallop Respiratory: Clinically clear no added sound  Discharge Instructions   Discharge Instructions    Diet - low sodium heart healthy    Complete by:  As directed      Increase activity slowly    Complete by:  As directed           Current Discharge Medication List    START taking these medications   Details  cyanocobalamin (,VITAMIN B-12,) 1000 MCG/ML injection Inject 1 mL (1,000 mcg total) into the  muscle once. Qty: 1 mL, Refills: 0    folic acid (FOLVITE) 1 MG tablet Take 1 tablet (1 mg total) by mouth daily. Qty: 30 tablet, Refills: 0    polyethylene glycol (MIRALAX / GLYCOLAX) packet Take 17 g by mouth daily as needed for mild constipation. Qty: 14 each, Refills: 0      CONTINUE these medications which have  NOT CHANGED   Details  glipiZIDE (GLUCOTROL) 10 MG tablet Take 1 tablet (10 mg total) by mouth daily before breakfast. Qty: 30 tablet, Refills: 2    lisinopril (PRINIVIL,ZESTRIL) 20 MG tablet Take 1 tablet (20 mg total) by mouth daily. Qty: 30 tablet, Refills: 1    metFORMIN (GLUCOPHAGE) 500 MG tablet Take 1 tablet (500 mg total) by mouth 2 (two) times daily. Qty: 60 tablet, Refills: 1    Vitamin D, Ergocalciferol, (DRISDOL) 50000 units CAPS capsule Take 1 capsule (50,000 Units total) by mouth every 7 (seven) days. Qty: 16 capsule, Refills: 0           The results of significant diagnostics from this hospitalization (including imaging, microbiology, ancillary and laboratory) are listed below for reference.    Significant Diagnostic Studies: Dg Chest 2 View  09/14/2015  CLINICAL DATA:  Fall.  Altered level of consciousness. EXAM: CHEST  2 VIEW COMPARISON:  03/25/2015 FINDINGS: Heart size appears normal. No pleural effusion or edema identified. No airspace consolidation noted. The visualized osseous structures are unremarkable. IMPRESSION: 1. No acute cardiopulmonary abnormalities. Electronically Signed   By: Kerby Moors M.D.   On: 09/14/2015 19:19   Ct Head Wo Contrast  09/14/2015  CLINICAL DATA:  Fall, altered mental status, chronic confusion EXAM: CT HEAD WITHOUT CONTRAST TECHNIQUE: Contiguous axial images were obtained from the base of the skull through the vertex without contrast. COMPARISON:  03/25/2015 FINDINGS: Similar diffuse brain atrophy with chronic white matter microvascular changes throughout the cerebral hemispheres. Mild associated ventricular enlargement as before. No acute intracranial hemorrhage, definite infarction, mass lesion, midline shift, herniation, or extra-axial fluid collection. No focal mass effect or edema. Cisterns are patent. Cerebellar atrophy as well. Atherosclerosis of the intracranial vessels of the skullbase. Symmetric orbits. Mastoids and sinuses  remain clear. Skull intact. IMPRESSION: Stable atrophy and chronic white matter microvascular ischemic changes. No acute intracranial finding or interval change by noncontrast imaging. Electronically Signed   By: Jerilynn Mages.  Shick M.D.   On: 09/14/2015 19:31   Mr Brain Wo Contrast  09/15/2015  CLINICAL DATA:  Confusion and multiple falls. EXAM: MRI HEAD WITHOUT CONTRAST TECHNIQUE: Multiplanar, multiecho pulse sequences of the brain and surrounding structures were obtained without intravenous contrast. COMPARISON:  CT head without contrast 09/14/2015, 03/25/2015 and 03/27/2009. Since 03/25/2015 but This has not significantly changed. FINDINGS: The ventricles are dilated. This has not significantly changed since 03/25/2015 but has progressed since 2010. Moderate confluent periventricular T2 changes are evident bilaterally. The third ventricle and fourth ventricle are within normal limits for size. There is mild diffuse atrophy is well. No acute infarct is present. There is no acute hemorrhage or mass lesion. No significant extra-axial fluid collection is present. The internal auditory canals are within normal limits bilaterally. The cerebellum and brainstem are within normal limits. Flow is present in the major intracranial arteries. The globes and orbits are intact. Mild mucosal thickening is present in the ethmoid air cells and maxillary sinuses bilaterally. Sphenoid sinuses are clear. The frontal sinuses are clear. The mastoid air cells are clear. The skullbase is within normal limits. Midline sagittal images  are unremarkable. IMPRESSION: 1. Mild ventricular dilation in diffuse periventricular T2 hyperintensity. Although the ventricular size has not changed significantly since 03/2015, there is progression since 2010. This raises concern for hydrocephalus. 2. Mild atrophy is also present, but disproportionate to the ventricular size and periventricular white matter change. 3. No evidence for acute infarct. Electronically  Signed   By: San Morelle M.D.   On: 09/15/2015 20:19   US Soft Tissue Head/neck  09/02/2015  CLINICAL DATA:  Hypercalcemia.  Clinical goiter. EXAM: THYROID ULTRASOUND TECHNIQUE: Ultrasound examination of the thyroid gland and adjacent soft tissues was performed. COMPARISON:  None. FINDINGS: Right thyroid lobe Measurements: 5.4 x 3.1 x 2.5 cm. Right thyroid tissue is mildly heterogeneous but no discrete thyroid nodules. Thyroid tissue is hypervascular. Left thyroid lobe Measurements: 5.3 x 3.2 x 2.4 cm. Left thyroid tissue is mildly heterogeneous without discrete nodules. The thyroid tissue is hypervascular. Isthmus Thickness: 0.9 cm.  No nodules visualized. Lymphadenopathy None visualized. IMPRESSION: Thyroid tissue is prominent for size and hypervascular. No discrete thyroid nodules. Electronically Signed   By: Markus Daft M.D.   On: 09/02/2015 16:30    Microbiology: Recent Results (from the past 240 hour(s))  Urine culture     Status: None   Collection Time: 09/14/15  8:00 PM  Result Value Ref Range Status   Specimen Description URINE, CATHETERIZED  Final   Special Requests NONE  Final   Culture   Final    NO GROWTH 1 DAY Performed at [facility]    Report Status 09/16/2015 FINAL  Final     Labs: Basic Metabolic Panel:  Recent Labs Lab 09/14/15 1948 09/14/15 1950 09/16/15 0559  NA 142  --   --   K 3.0*  --   --   CL 105  --   --   CO2 27  --   --   GLUCOSE 149*  --   --   BUN 14  --   --   CREATININE 0.64  --   --   CALCIUM 9.9  --  9.2  MG  --  1.4*  --    Liver Function Tests:  Recent Labs Lab 09/14/15 1948  AST 21  ALT 15  ALKPHOS 43  BILITOT 0.8  PROT 7.6  ALBUMIN 3.8   No results for input(s): LIPASE, AMYLASE in the last 168 hours.  Recent Labs Lab 09/18/15 1117  AMMONIA 26   CBC:  Recent Labs Lab 09/14/15 1948 09/18/15 0640  WBC 10.7* 5.8  NEUTROABS 7.9*  --   HGB 12.3 10.7*  HCT 34.9* 29.6*  MCV 85.3 85.1  PLT 334 261    Cardiac Enzymes:  Recent Labs Lab 09/14/15 1948 09/17/15 0624  CKTOTAL 384* 123  CKMB  --  2.2  TROPONINI 0.03  --    BNP: BNP (last 3 results) No results for input(s): BNP in the last 8760 hours.  ProBNP (last 3 results) No results for input(s): PROBNP in the last 8760 hours.  CBG:  Recent Labs Lab 09/18/15 0750 09/18/15 1213 09/18/15 1642 09/18/15 1946 09/19/15 0723  GLUCAP 152* 142* 157* 184* 138*       Signed:  Nita Sells MD   [facility] 09/19/2015, 8:19 AM

## 2015-09-19 NOTE — Progress Notes (Signed)
Attempted to ambulate with pt to the restroom. Pt unsuccessful. Pt was able to stand and pivot to the bedside commode. Pt gait is impaired and she is still unsteady on her feet.

## 2015-09-19 NOTE — Progress Notes (Signed)
CSW contacted [facility] [place] who report they are still awaiting SNF auth. CSW requested they consider taking her today with LOG- she reports they cannot accept that. CSW will advise MD and [facility] RN and have CSW covering Monday to further facilitate dc.  Eduard Clos, MSW, LCSW  [phone]

## 2015-09-19 NOTE — Progress Notes (Signed)
Advised that son wants his mother to go to [place] will fax clinicals to [facility] for consideration but if not accepted or no beds available, will have to go with bed secured at [facility]. SonMarya Amsler [place] [phone]. Will advise CSW covering tomorrow of above-  Eduard Clos, MSW, [place]  [phone]

## 2015-09-19 NOTE — Progress Notes (Signed)
Called Debbie, Adm Coord at [facility], to give report.  She stated that [facility] has not received authorization from [facility].  Jackelyn Poling states that they submitted their paperwork on Fri and so far haven't received anything.  She is going to call social worker and try to get in touch w/[employer] this AM.

## 2015-09-20 LAB — GLUCOSE, CAPILLARY
GLUCOSE-CAPILLARY: 199 mg/dL — AB (ref 65–99)
Glucose-Capillary: 145 mg/dL — ABNORMAL HIGH (ref 65–99)
Glucose-Capillary: 191 mg/dL — ABNORMAL HIGH (ref 65–99)

## 2015-09-20 LAB — ANTINUCLEAR ANTIBODIES, IFA: ANA Ab, IFA: NEGATIVE

## 2015-09-20 NOTE — Plan of Care (Signed)
Problem: Safety: Goal: Ability to remain free from injury will improve Outcome: Progressing Pt remains free of falls.   Problem: Health Behavior/Discharge Planning: Goal: Ability to manage health-related needs will improve Outcome: Progressing Social work involved with pt placement.   Problem: Pain Managment: Goal: General experience of comfort will improve Outcome: Progressing Pt denies pain.  Problem: Physical Regulation: Goal: Ability to maintain clinical measurements within normal limits will improve Outcome: Not Progressing Pt's blood pressure still remains out of designated parameters and requires medication to regulate.   Problem: Skin Integrity: Goal: Risk for impaired skin integrity will decrease Outcome: Progressing Pt's skin is still intact and appears appropriate.  Problem: Tissue Perfusion: Goal: Risk factors for ineffective tissue perfusion will decrease Outcome: Progressing VTE is Lovenox.   Problem: Activity: Goal: Risk for activity intolerance will decrease Outcome: Progressing Pt uses BSC.

## 2015-09-20 NOTE — Progress Notes (Signed)
No overall changes.  mildly confused but overall improved.  Refer to d/c summary dated 3.5.17  Verneita Griffes, MD [facility] [phone]

## 2015-09-20 NOTE — Care Management Note (Signed)
Case Management Note  Patient Details  Name: Maria Parrish MRN: [id number] Date of Birth: [birth year]  Expected Discharge Date:                  Expected Discharge Plan:  [place]  In-House Referral:  Clinical Social Work  Discharge planning Services  CM Consult  Post Acute Care Choice:  Home Health, Durable Medical Equipment Choice offered to:  NA, Patient  DME Arranged:  Programmer, multimedia DME Agency:  [place]:  RN, PT, Nurse's Aide, Social Work [employer] Agency:  [place]  Status of Service:  Completed, signed off  Medicare Important Message Given:  Yes Date Medicare IM Given:    Medicare IM give by:    Date Additional Medicare IM Given:    Additional Medicare Important Message give by:     If discussed at [place] of Stay Meetings, dates discussed:    Additional Comments: Unable to get auth for SNF placement. Pt will have to DC home. CM discussed this with son [name]. Pt's son understands. Pt ordered wheelchair and [place] services. Pt's son has [facility]oson [facility] for DME and HH needs. [name], of [facility], made aware of referral and will obtain pt info from chart. Pt's son understands HH has 48 hours to make first visit. No further CM needs.   Sherald Barge, RN 09/20/2015, 4:07 PM

## 2015-09-20 NOTE — Care Management Important Message (Signed)
Important Message  Patient Details  Name: Maria Parrish MRN: [id number] Date of Birth: Jun 28, 1946   Medicare Important Message Given:  Yes    Sherald Barge, RN 09/20/2015, 11:04 AM

## 2015-09-20 NOTE — Care Management Note (Signed)
Case Management Note  Patient Details  Name: Maria Parrish MRN: [id number] Date of Birth: [birth year]  Subjective/Objective:                  Pt is from home, will DC to SNF. CSW is aware and will arrange for placement.  Action/Plan: Pt discharging today. No CM needs.   Expected Discharge Date:    09/20/3015              Expected Discharge Plan:  Skilled Nursing Facility  In-House Referral:  Clinical Social Work  Discharge planning Services  CM Consult  Post Acute Care Choice:  NA Choice offered to:  NA  DME Arranged:    DME Agency:     HH Arranged:    [place] Agency:     Status of Service:  Completed, signed off  Medicare Important Message Given:  Yes Date Medicare IM Given:    Medicare IM give by:    Date Additional Medicare IM Given:    Additional Medicare Important Message give by:     If discussed at [place] of Stay Meetings, dates discussed:    Additional Comments:  Sherald Barge, RN 09/20/2015, 11:04 AM

## 2015-09-22 ENCOUNTER — Observation Stay (HOSPITAL_COMMUNITY): Payer: Medicare HMO

## 2015-09-22 ENCOUNTER — Emergency Department (HOSPITAL_COMMUNITY): Payer: Medicare HMO

## 2015-09-22 ENCOUNTER — Encounter (HOSPITAL_COMMUNITY): Payer: Self-pay

## 2015-09-22 ENCOUNTER — Observation Stay (HOSPITAL_COMMUNITY)
Admission: EM | Admit: 2015-09-22 | Discharge: 2015-09-24 | Disposition: A | Discharge status: Home / Self Care | Payer: Medicare HMO | Attending: Internal Medicine | Admitting: Internal Medicine

## 2015-09-22 DIAGNOSIS — F0390 Unspecified dementia without behavioral disturbance: Secondary | ICD-10-CM | POA: Diagnosis not present

## 2015-09-22 DIAGNOSIS — E785 Hyperlipidemia, unspecified: Secondary | ICD-10-CM | POA: Diagnosis not present

## 2015-09-22 DIAGNOSIS — Z7984 Long term (current) use of oral hypoglycemic drugs: Secondary | ICD-10-CM | POA: Insufficient documentation

## 2015-09-22 DIAGNOSIS — E119 Type 2 diabetes mellitus without complications: Secondary | ICD-10-CM

## 2015-09-22 DIAGNOSIS — E1140 Type 2 diabetes mellitus with diabetic neuropathy, unspecified: Secondary | ICD-10-CM | POA: Diagnosis not present

## 2015-09-22 DIAGNOSIS — G9340 Encephalopathy, unspecified: Secondary | ICD-10-CM | POA: Diagnosis not present

## 2015-09-22 DIAGNOSIS — E440 Moderate protein-calorie malnutrition: Secondary | ICD-10-CM | POA: Diagnosis present

## 2015-09-22 DIAGNOSIS — R4182 Altered mental status, unspecified: Secondary | ICD-10-CM | POA: Diagnosis present

## 2015-09-22 DIAGNOSIS — IMO0002 Reserved for concepts with insufficient information to code with codable children: Secondary | ICD-10-CM

## 2015-09-22 DIAGNOSIS — R29810 Facial weakness: Secondary | ICD-10-CM | POA: Diagnosis present

## 2015-09-22 DIAGNOSIS — Z79899 Other long term (current) drug therapy: Secondary | ICD-10-CM | POA: Insufficient documentation

## 2015-09-22 DIAGNOSIS — I10 Essential (primary) hypertension: Secondary | ICD-10-CM | POA: Diagnosis present

## 2015-09-22 DIAGNOSIS — G8194 Hemiplegia, unspecified affecting left nondominant side: Secondary | ICD-10-CM | POA: Diagnosis not present

## 2015-09-22 DIAGNOSIS — E118 Type 2 diabetes mellitus with unspecified complications: Secondary | ICD-10-CM

## 2015-09-22 DIAGNOSIS — R410 Disorientation, unspecified: Secondary | ICD-10-CM

## 2015-09-22 DIAGNOSIS — E782 Mixed hyperlipidemia: Secondary | ICD-10-CM | POA: Diagnosis present

## 2015-09-22 DIAGNOSIS — E1165 Type 2 diabetes mellitus with hyperglycemia: Secondary | ICD-10-CM

## 2015-09-22 DIAGNOSIS — Z794 Long term (current) use of insulin: Secondary | ICD-10-CM

## 2015-09-22 HISTORY — DX: Urinary tract infection, site not specified: N39.0

## 2015-09-22 HISTORY — DX: Weakness: R53.1

## 2015-09-22 HISTORY — DX: Moderate protein-calorie malnutrition: E44.0

## 2015-09-22 HISTORY — DX: Unspecified dementia, unspecified severity, without behavioral disturbance, psychotic disturbance, mood disturbance, and anxiety: F03.90

## 2015-09-22 LAB — COMPREHENSIVE METABOLIC PANEL
ALBUMIN: 3.7 g/dL (ref 3.5–5.0)
ALK PHOS: 50 U/L (ref 38–126)
ALT: 51 U/L (ref 14–54)
ANION GAP: 9 (ref 5–15)
AST: 37 U/L (ref 15–41)
BILIRUBIN TOTAL: 0.6 mg/dL (ref 0.3–1.2)
BUN: 14 mg/dL (ref 6–20)
CO2: 26 mmol/L (ref 22–32)
Calcium: 9.8 mg/dL (ref 8.9–10.3)
Chloride: 103 mmol/L (ref 101–111)
Creatinine, Ser: 0.85 mg/dL (ref 0.44–1.00)
GFR calc non Af Amer: >60 mL/min (ref >60)
GLUCOSE: 183 mg/dL — AB (ref 65–99)
Potassium: 3.7 mmol/L (ref 3.5–5.1)
SODIUM: 138 mmol/L (ref 135–145)
Total Protein: 7.9 g/dL (ref 6.5–8.1)

## 2015-09-22 LAB — URINE MICROSCOPIC-ADD ON

## 2015-09-22 LAB — DIFFERENTIAL
Basophils Absolute: 0 10*3/uL (ref 0.0–0.1)
Basophils Relative: 0 %
EOS PCT: 4 %
Eosinophils Absolute: 0.2 10*3/uL (ref 0.0–0.7)
LYMPHS ABS: 2.1 10*3/uL (ref 0.7–4.0)
LYMPHS PCT: 34 %
MONO ABS: 0.5 10*3/uL (ref 0.1–1.0)
Monocytes Relative: 8 %
NEUTROS ABS: 3.5 10*3/uL (ref 1.7–7.7)
Neutrophils Relative %: 54 %

## 2015-09-22 LAB — URINALYSIS, ROUTINE W REFLEX MICROSCOPIC
BILIRUBIN URINE: NEGATIVE
BILIRUBIN URINE: NEGATIVE
GLUCOSE, UA: 100 mg/dL — AB
Glucose, UA: NEGATIVE mg/dL
HGB URINE DIPSTICK: NEGATIVE
HGB URINE DIPSTICK: NEGATIVE
KETONES UR: 15 mg/dL — AB
Leukocytes, UA: NEGATIVE
Leukocytes, UA: NEGATIVE
Nitrite: NEGATIVE
Nitrite: NEGATIVE
PH: 6 (ref 5.0–8.0)
PH: 5.5 (ref 5.0–8.0)
Protein, ur: 100 mg/dL — AB
Protein, ur: 100 mg/dL — AB
Specific Gravity, Urine: >1.03 — ABNORMAL HIGH (ref 1.005–1.030)

## 2015-09-22 LAB — RAPID URINE DRUG SCREEN, HOSP PERFORMED
AMPHETAMINES: NOT DETECTED
BENZODIAZEPINES: NOT DETECTED
Barbiturates: NOT DETECTED
Cocaine: NOT DETECTED
OPIATES: NOT DETECTED
Tetrahydrocannabinol: NOT DETECTED

## 2015-09-22 LAB — PROTIME-INR
INR: 0.99 (ref 0.00–1.49)
PROTHROMBIN TIME: 13.3 s (ref 11.6–15.2)

## 2015-09-22 LAB — GLUCOSE, CAPILLARY
GLUCOSE-CAPILLARY: 119 mg/dL — AB (ref 65–99)
Glucose-Capillary: 191 mg/dL — ABNORMAL HIGH (ref 65–99)

## 2015-09-22 LAB — I-STAT TROPONIN, ED: Troponin i, poc: 0 ng/mL (ref 0.00–0.08)

## 2015-09-22 LAB — CBC
HCT: 36.2 % (ref 36.0–46.0)
HEMOGLOBIN: 12.7 g/dL (ref 12.0–15.0)
MCH: 30 pg (ref 26.0–34.0)
MCHC: 35.1 g/dL (ref 30.0–36.0)
MCV: 85.4 fL (ref 78.0–100.0)
PLATELETS: 381 10*3/uL (ref 150–400)
RBC: 4.24 MIL/uL (ref 3.87–5.11)
RDW: 12.7 % (ref 11.5–15.5)
WBC: 6.4 10*3/uL (ref 4.0–10.5)

## 2015-09-22 LAB — CK: Total CK: 52 U/L (ref 38–234)

## 2015-09-22 LAB — CBG MONITORING, ED: Glucose-Capillary: 166 mg/dL — ABNORMAL HIGH (ref 65–99)

## 2015-09-22 LAB — ETHANOL: Alcohol, Ethyl (B): <5 mg/dL (ref <5)

## 2015-09-22 LAB — APTT: aPTT: 25 seconds (ref 24–37)

## 2015-09-22 LAB — MAGNESIUM: Magnesium: 1.5 mg/dL — ABNORMAL LOW (ref 1.7–2.4)

## 2015-09-22 LAB — VITAMIN B12: Vitamin B-12: 443 pg/mL (ref 180–914)

## 2015-09-22 MED ORDER — POTASSIUM CHLORIDE IN NACL 20-0.9 MEQ/L-% IV SOLN
INTRAVENOUS | Status: DC
  Administered 2015-09-22 – 2015-09-24 (×3): via INTRAVENOUS

## 2015-09-22 MED ORDER — ACETAMINOPHEN 325 MG PO TABS: 650.0000 mg | ORAL_TABLET | ORAL | Status: DC | PRN

## 2015-09-22 MED ORDER — SODIUM CHLORIDE 0.9 % IV SOLN: INTRAVENOUS | Status: DC

## 2015-09-22 MED ORDER — MAGNESIUM SULFATE 2 GM/50ML IV SOLN
2.0000 g | Freq: Once | INTRAVENOUS | Status: AC
  Administered 2015-09-22: 2 g via INTRAVENOUS
  Filled 2015-09-22: qty 50

## 2015-09-22 MED ORDER — ENOXAPARIN SODIUM 40 MG/0.4ML ~~LOC~~ SOLN
40.0000 mg | SUBCUTANEOUS | Status: DC
  Administered 2015-09-22 – 2015-09-24 (×3): 40 mg via SUBCUTANEOUS
  Filled 2015-09-22 (×3): qty 0.4

## 2015-09-22 MED ORDER — ASPIRIN 325 MG PO TABS
325.0000 mg | ORAL_TABLET | Freq: Every day | ORAL | Status: DC
  Administered 2015-09-22 – 2015-09-24 (×3): 325 mg via ORAL
  Filled 2015-09-22 (×3): qty 1

## 2015-09-22 MED ORDER — INSULIN ASPART 100 UNIT/ML ~~LOC~~ SOLN
0.0000 [IU] | Freq: Every day | SUBCUTANEOUS | Status: DC
  Administered 2015-09-23: 2 [IU] via SUBCUTANEOUS

## 2015-09-22 MED ORDER — ASPIRIN 300 MG RE SUPP: 300.0000 mg | Freq: Every day | RECTAL | Status: DC

## 2015-09-22 MED ORDER — SENNOSIDES-DOCUSATE SODIUM 8.6-50 MG PO TABS: 1.0000 | ORAL_TABLET | Freq: Every evening | ORAL | Status: DC | PRN

## 2015-09-22 MED ORDER — INSULIN ASPART 100 UNIT/ML ~~LOC~~ SOLN
0.0000 [IU] | Freq: Three times a day (TID) | SUBCUTANEOUS | Status: DC
  Administered 2015-09-23: 1 [IU] via SUBCUTANEOUS
  Administered 2015-09-23: 2 [IU] via SUBCUTANEOUS
  Administered 2015-09-23 – 2015-09-24 (×2): 1 [IU] via SUBCUTANEOUS

## 2015-09-22 MED ORDER — VITAMIN D (ERGOCALCIFEROL) 1.25 MG (50000 UNIT) PO CAPS
50000.0000 [IU] | ORAL_CAPSULE | ORAL | Status: DC
  Filled 2015-09-22: qty 1

## 2015-09-22 MED ORDER — HYDRALAZINE HCL 20 MG/ML IJ SOLN: 10.0000 mg | Freq: Four times a day (QID) | INTRAMUSCULAR | Status: DC | PRN

## 2015-09-22 MED ORDER — ACETAMINOPHEN 650 MG RE SUPP: 650.0000 mg | RECTAL | Status: DC | PRN

## 2015-09-22 MED ORDER — VITAMIN B-12 1000 MCG PO TABS
500.0000 ug | ORAL_TABLET | Freq: Every day | ORAL | Status: DC
  Administered 2015-09-22 – 2015-09-24 (×3): 500 ug via ORAL
  Filled 2015-09-22 (×3): qty 1

## 2015-09-22 MED ORDER — FOLIC ACID 1 MG PO TABS
1.0000 mg | ORAL_TABLET | Freq: Every day | ORAL | Status: DC
  Administered 2015-09-22 – 2015-09-24 (×3): 1 mg via ORAL
  Filled 2015-09-22 (×3): qty 1

## 2015-09-22 MED ORDER — ONDANSETRON HCL 4 MG/2ML IJ SOLN: 4.0000 mg | Freq: Four times a day (QID) | INTRAMUSCULAR | Status: DC | PRN

## 2015-09-22 MED ORDER — STROKE: EARLY STAGES OF RECOVERY BOOK
Freq: Once | Status: AC
  Administered 2015-09-23
  Filled 2015-09-22: qty 1

## 2015-09-22 NOTE — Progress Notes (Signed)
[facility] CT ROOM, LAB DRAWING BLOOD 1323 PERFORM EXAM/SEND TO SOC 1327 [facility] CALLED

## 2015-09-22 NOTE — ED Notes (Signed)
CBG per ems 0000000, bp [id number] systolic

## 2015-09-22 NOTE — H&P (Addendum)
History and Physical  Maria Parrish [id number] DOB: [birth year] DOA: 09/22/2015   PCP: Shade Flood, MD  Referring Physician: ED/ Dr. Dolly Rias  Chief Complaint: L-hemiparesis  HPI:   70 year old female with a history of dementia, hypertension, hyperparathyroidism, diabetes mellitus type 2, hyperlipidemia presented with left hemiparesis and left facial droop according to the patient's family. The patient is pleasantly confused and his ibuprofen any significant history.History is obtained from review of the medical record and speaking with the patient's son. The patient's son stated that he stepped out of her room to go do some paperwork. Shortly thereafter, he was called to come back to the room when a worker at the nursing facility noted that the patient to be less responsive. Apparently the patient was awake, but did not respond to verbal or tactile stimuli. There was no body clenching or tonic-clonic activity noted, but the patient's son thought that he noted some left facial droop. Apparently, there was also concern for left upper and left lower extremity weakness. According to the patient's son, the patient's mental status had improved although not back to baseline at the time she arrived to the emergency department. He felt that her left facial droop had improved in the emergency department. The patient was recently discharged from the hospital after a prolonged stay from 09/14/2015 through 09/19/2015 for acute encephalopathy. She was ultimately just discharged to SNF, [facility]. It was thought during that time that UTI may have contributed to her confusion although there was concern for possible NPH. Neurology was consulted, but did not feel the patient's MRI was impressive for NPH. Extensive workup was obtained and found the patient to have a low B12 level. She was started on intramuscular B12. Unfortunately, it is unclear when her last dose was. The patient was treated with 3 days of  antibiotics. Additional workup including ammonia, RPR, B12, homocysteine, ANA were unremarkable. Sedimentation rate was 70. In the emergency department, CT of the brain was negative. BMP, calcium, CBC were unremarkable.  Assessment/Plan:  left hemiparesis  -Symptoms resolved at the time of presentation to the emergency department  -09/15/2015 MRI brain negative for acute infarct which showed progressive mild ventricular dilatation when compared to MRI in 2010  -Repeat MRI brain  -Start patient on aspirin  -Hemoglobin A1c on 08/10/2015 7.9  -09/17/2015 TSH 1.951 -09/18/2015 ammonia 26 -LDL  -PT/OT/ST  -Carotid ultrasound  -Echocardiogram   acute encephalopathy -UA and urine culture -09/22/2015 CT brain negative -09/14/2015 B12 158 -Recheck B12 also I suspect there will not be significant improvement, but if this remains low, it may be contributed to the patient's intermittent confusion -I suspect. Patient may have a degree of iron depletion/dehydration as she has mild elevation of her BUN and creatinine above her baseline renal function and she has mild hemoconcentration  -IV fluids--will give 1L and reassess i am 3/9 -Chest x-ray -EEG -Consult neurology--? Need for LP Diabetes mellitus type 2  -NovoLog sliding scale  -Hold metformin  -Hold her glipizide  Hypertension  -Allow for permissive hypertension in the first 24 hours  -Hydralazine prn for SBP >200 -Hold lisinopril  Hyperparathyroidism -corrected calcium approx 9.9 Weight loss/moderate malnutrition -08/12/2015 EGD and colonoscopy unremarkable except for diverticulosis and small hiatus hernia -Nutrition consult -Hepatitis B surface antigen, hepatitis C antibody, HIV      Past Medical History  Diagnosis Date  . Diabetes mellitus without complication ([place])   . Hypertension   . Hyperlipidemia   . Hypercalcemia   .  Hyperparathyroidism ([place])   . Confusion   . Assistance needed for mobility     walks with walker    . Peripheral neuropathy ([place])   . UTI (lower urinary tract infection)   . Moderate protein-calorie malnutrition ([place])   . Hypertension   . Dementia   . Generalized weakness    Past Surgical History  Procedure Laterality Date  . None    . Colonoscopy N/A 08/12/2015    Procedure: COLONOSCOPY;  Surgeon: Daneil Dolin, MD;  Location: [facility];  Service: Endoscopy;  Laterality: N/A;  1245pm  . Esophagogastroduodenoscopy N/A 08/12/2015    Procedure: ESOPHAGOGASTRODUODENOSCOPY (EGD);  Surgeon: Daneil Dolin, MD;  Location: [facility];  Service: Endoscopy;  Laterality: N/A;   Social History:  reports that she has never smoked. She does not have any smokeless tobacco history on file. She reports that she does not drink alcohol or use illicit drugs.   Family History  Problem Relation Age of Onset  . Diabetes Father   . Heart attack Mother   . Colon cancer Neg Hx   . Hypertension Brother   . Hypertension Brother   . Diabetes Brother      Allergies  Allergen Reactions  . Penicillins Other (See Comments)    Makes patient sore.  Has patient had a PCN reaction causing immediate rash, facial/tongue/throat swelling, SOB or lightheadedness with hypotension: No Has patient had a PCN reaction causing severe rash involving mucus membranes or skin necrosis: No Has patient had a PCN reaction that required hospitalization No Has patient had a PCN reaction occurring within the last 10 years: No If all of the above answers are "NO", then may proceed with Cephalosporin use.   . Statins Other (See Comments)    Makes wrists and fingers ache      Prior to Admission medications   Medication Sig Start Date End Date Taking? Authorizing Provider  cyanocobalamin (,VITAMIN B-12,) 1000 MCG/ML injection Inject 1 mL (1,000 mcg total) into the muscle once. Patient not taking: Reported on 09/22/2015 09/19/15   Nita Sells, MD  folic acid (FOLVITE) 1 MG tablet Take 1 tablet (1 mg total) by  mouth daily. 09/19/15   Nita Sells, MD  glipiZIDE (GLUCOTROL) 10 MG tablet Take 1 tablet (10 mg total) by mouth daily before breakfast. 09/09/15   Cassandria Anger, MD  lisinopril (PRINIVIL,ZESTRIL) 20 MG tablet Take 1 tablet (20 mg total) by mouth daily. 03/27/15   Kathie Dike, MD  metFORMIN (GLUCOPHAGE) 500 MG tablet Take 1 tablet (500 mg total) by mouth 2 (two) times daily. 03/27/15   Kathie Dike, MD  polyethylene glycol (MIRALAX / GLYCOLAX) packet Take 17 g by mouth daily as needed for mild constipation. 09/19/15   Nita Sells, MD  Vitamin D, Ergocalciferol, (DRISDOL) 50000 units CAPS capsule Take 1 capsule (50,000 Units total) by mouth every 7 (seven) days. 09/09/15   Cassandria Anger, MD    Review of Systems:   limited review of systems secondary to the patient's acute encephalopathy. The patient was able to tell me she denies chest pain, short of breath, headache, vomiting, diarrhea, abdominal pain.  Physical Exam: Filed Vitals:   09/22/15 1339 09/22/15 1402 09/22/15 1422 09/22/15 1430  BP: 183/113 178/73 161/84 157/86  Pulse: 86  75   Temp: 98.5 F (36.9 C)     TempSrc: Oral     Resp: 17 25 26 23   SpO2: 100%  100%    General:  Awake and alert,  NAD, nontoxic, pleasant/cooperative Head/Eye: No conjunctival hemorrhage, no icterus, [place]/AT, No nystagmus ENT:  No icterus,  No thrush, good dentition, no pharyngeal exudate Neck:  No masses, no lymphadenpathy, no bruits CV:  RRR, no rub, no gallop, no S3 Lung:  poor inspiratory effort with auscultation. No wheezing. Abdomen: soft/NT, +BS, nondistended, no peritoneal signs Ext: No cyanosis, No rashes, No petechiae, No lymphangitis, No edema Neuro: CNII-XII intact, strength 4-/5 in bilateral upper and lower extremities, no dysmetria  Labs on Admission:  Basic Metabolic Panel:  Recent Labs Lab 09/16/15 0559 09/19/15 0621 09/22/15 1317  NA  --  140 138  K  --  3.1* 3.7  CL  --  107 103  CO2  --  26 26   GLUCOSE  --  153* 183*  BUN  --  9 14  CREATININE  --  0.41* 0.85  CALCIUM 9.2 9.1 9.8  MG  --   --  1.5*   Liver Function Tests:  Recent Labs Lab 09/19/15 0621 09/22/15 1317  AST 25 37  ALT 29 51  ALKPHOS 43 50  BILITOT 0.5 0.6  PROT 6.3* 7.9  ALBUMIN 3.0* 3.7   No results for input(s): LIPASE, AMYLASE in the last 168 hours.  Recent Labs Lab 09/18/15 1117  AMMONIA 26   CBC:  Recent Labs Lab 09/18/15 0640 09/22/15 1317  WBC 5.8 6.4  NEUTROABS  --  3.5  HGB 10.7* 12.7  HCT 29.6* 36.2  MCV 85.1 85.4  PLT 261 381   Cardiac Enzymes:  Recent Labs Lab 09/17/15 0624  CKTOTAL 123  CKMB 2.2   BNP: Invalid input(s): POCBNP CBG:  Recent Labs Lab 09/19/15 2025 09/20/15 0712 09/20/15 1119 09/20/15 1607 09/22/15 1349  GLUCAP 166* 145* 191* 199* 166*    Radiological Exams on Admission: Ct Head Wo Contrast  09/22/2015  ADDENDUM REPORT: 09/22/2015 13:50 ADDENDUM: Critical Value/emergent results were called by telephone at the time of interpretation on 09/22/2015 at 1:35 pm to Dr. Merrily Pew , who verbally acknowledged these results. Electronically Signed   By: Skipper Cliche M.D.   On: 09/22/2015 13:50  09/22/2015  CLINICAL DATA:  Weakness, left-sided deficits, code stroke EXAM: CT HEAD WITHOUT CONTRAST TECHNIQUE: Contiguous axial images were obtained from the base of the skull through the vertex without intravenous contrast. COMPARISON:  09/15/2015, 09/14/2015 FINDINGS: Moderate diffuse cortical atrophy and low attenuation in the deep white matter. No evidence of focal vascular territory infarct or mass. No parenchymal hemorrhage or extra-axial fluid. Ventricles prominent in proportion to atrophy. Calvarium is intact. Visualized portions of paranasal sinuses clear. IMPRESSION: Chronic involutional change with no acute findings Electronically Signed: By: Skipper Cliche M.D. On: 09/22/2015 13:32    EKG: Independently reviewed. Sinus rhythm, nonspecific T-wave  change    Time spent:60 minutes Code Status:   FULL Family Communication: Son updated at bedside   Shaheim Mahar, DO  [facility] Pager [phone]  If 7PM-7AM, please contact night-coverage [email or web address] Password [facility] 09/22/2015, 3:46 PM

## 2015-09-22 NOTE — ED Notes (Signed)
Per [facility] staff and EMS.  Pt was last seen normal at 1215.   Staff reports pt had just arrived at the facility for rehab from UTI and AMS.  Staff says pt was alert, oriented, answering all questions.  At 1230, staff noticed pt had a facial droop, not answering questions, weakness, and leaning to the right.  Staff had pt's family come see her and said this is not normal and told them to call 911.  Staff called ahead to ER and code stroke called.  EMS reports pt has been nonverbal with them since they arrived.

## 2015-09-22 NOTE — ED Provider Notes (Signed)
CSN: [id number]     Arrival date & time 09/22/15  1315 History   First MD Initiated Contact with Patient 09/22/15 1316     Chief Complaint  Patient presents with  . Code Stroke     (Consider location/radiation/quality/duration/timing/severity/associated sxs/prior Treatment) HPI  Around 1220 last seen normal. Found by staff to have acute onset of confusion, left sided facial droop and left sided weakness, family agreed it was new. EMS called, symptoms improved but still present on their arrival. Transferred here. Here patient knows name but does not offer any other history.   Past Medical History  Diagnosis Date  . Diabetes mellitus without complication ([place])   . Hypertension   . Hyperlipidemia   . Hypercalcemia   . Hyperparathyroidism ([place])   . Confusion   . Assistance needed for mobility     walks with walker  . Peripheral neuropathy ([place])   . UTI (lower urinary tract infection)   . Moderate protein-calorie malnutrition ([place])   . Hypertension   . Dementia   . Generalized weakness    Past Surgical History  Procedure Laterality Date  . None    . Colonoscopy N/A 08/12/2015    Procedure: COLONOSCOPY;  Surgeon: Daneil Dolin, MD;  [facility];  Service: Endoscopy;  Laterality: N/A;  1245pm  . Esophagogastroduodenoscopy N/A 08/12/2015    Procedure: ESOPHAGOGASTRODUODENOSCOPY (EGD);  Surgeon: Daneil Dolin, MD;  [facility];  Service: Endoscopy;  Laterality: N/A;   Family History  Problem Relation Age of Onset  . Diabetes Father   . Heart attack Mother   . Colon cancer Neg Hx   . Hypertension Brother   . Hypertension Brother   . Diabetes Brother    Social History  Substance Use Topics  . Smoking status: Never Smoker   . Smokeless tobacco: None  . Alcohol Use: No   OB History    No data available     Review of Systems  Unable to perform ROS: Mental status change      Allergies  Penicillins and Statins  Home Medications   Prior to  Admission medications   Medication Sig Start Date End Date Taking? Authorizing Provider  cyanocobalamin (,VITAMIN B-12,) 1000 MCG/ML injection Inject 1 mL (1,000 mcg total) into the muscle once. 09/19/15   Nita Sells, MD  folic acid (FOLVITE) 1 MG tablet Take 1 tablet (1 mg total) by mouth daily. 09/19/15   Nita Sells, MD  glipiZIDE (GLUCOTROL) 10 MG tablet Take 1 tablet (10 mg total) by mouth daily before breakfast. 09/09/15   Cassandria Anger, MD  lisinopril (PRINIVIL,ZESTRIL) 20 MG tablet Take 1 tablet (20 mg total) by mouth daily. 03/27/15   Kathie Dike, MD  metFORMIN (GLUCOPHAGE) 500 MG tablet Take 1 tablet (500 mg total) by mouth 2 (two) times daily. 03/27/15   Kathie Dike, MD  polyethylene glycol (MIRALAX / GLYCOLAX) packet Take 17 g by mouth daily as needed for mild constipation. 09/19/15   Nita Sells, MD  Vitamin D, Ergocalciferol, (DRISDOL) 50000 units CAPS capsule Take 1 capsule (50,000 Units total) by mouth every 7 (seven) days. 09/09/15   Cassandria Anger, MD   There were no vitals taken for this visit. Physical Exam  Constitutional: She appears well-developed and well-nourished.  HENT:  Head: Normocephalic and atraumatic.  Neck: Normal range of motion.  Cardiovascular: Normal rate and regular rhythm.   Pulmonary/Chest: Effort normal and breath sounds normal. No stridor. No respiratory distress.  Abdominal: Soft. Bowel  sounds are normal. She exhibits no distension. There is no tenderness. There is no rebound.  Musculoskeletal: Normal range of motion. She exhibits no edema or tenderness.  Neurological: She is alert. A cranial nerve deficit (slight left facial droop in the area of her lips, eyelids, eyebrows, forehead appear normal) is present.  Skin: Skin is warm and dry. No rash noted. No erythema.  Nursing note and vitals reviewed.   ED Course  Procedures (including critical care time) Labs Review Labs Reviewed  CBC  DIFFERENTIAL  ETHANOL   PROTIME-INR  APTT  COMPREHENSIVE METABOLIC PANEL  URINE RAPID DRUG SCREEN, HOSP PERFORMED  URINALYSIS, ROUTINE W REFLEX MICROSCOPIC (NOT AT [facility])  I-STAT CHEM 8, ED  Randolm Idol, ED    Imaging Review Dg Chest 2 View  09/22/2015  CLINICAL DATA:  Left facial droop, confusion, history of dementia EXAM: CHEST  2 VIEW COMPARISON:  Chest x-ray of 09/14/2015 FINDINGS: No active infiltrate or effusion is seen. Mediastinal and hilar contours are unremarkable. The heart is mildly enlarged and stable. No acute bony abnormality is seen. IMPRESSION: No active lung disease.  Stable mild cardiomegaly. Electronically Signed   By: Ivar Drape M.D.   On: 09/22/2015 16:50   Ct Head Wo Contrast  09/22/2015  ADDENDUM REPORT: 09/22/2015 13:50 ADDENDUM: Critical Value/emergent results were called by telephone at the time of interpretation on 09/22/2015 at 1:35 pm to Dr. Merrily Pew , who verbally acknowledged these results. Electronically Signed   By: Skipper Cliche M.D.   On: 09/22/2015 13:50  09/22/2015  CLINICAL DATA:  Weakness, left-sided deficits, code stroke EXAM: CT HEAD WITHOUT CONTRAST TECHNIQUE: Contiguous axial images were obtained from the base of the skull through the vertex without intravenous contrast. COMPARISON:  09/15/2015, 09/14/2015 FINDINGS: Moderate diffuse cortical atrophy and low attenuation in the deep white matter. No evidence of focal vascular territory infarct or mass. No parenchymal hemorrhage or extra-axial fluid. Ventricles prominent in proportion to atrophy. Calvarium is intact. Visualized portions of paranasal sinuses clear. IMPRESSION: Chronic involutional change with no acute findings Electronically Signed: By: Skipper Cliche M.D. On: 09/22/2015 13:32   I have personally reviewed and evaluated these images and lab results as part of my medical decision-making.   EKG Interpretation   Date/Time:  Wednesday September 22 2015 13:45:47 EST Ventricular Rate:  82 PR Interval:  102 QRS  Duration: 65 QT Interval:  569 QTC Calculation: 665 R Axis:   4 Text Interpretation:  Sinus rhythm Short PR interval Nonspecific T abnrm,  anterolateral leads Prolonged QT interval prolonged QT new since 09/14/15  Otherwise no significant change Confirmed by [facility] MD, Corene Cornea [phone]) on  09/22/2015 1:52:10 PM      MDM   Final diagnoses:  Left hemiparesis ([place])  Acute encephalopathy   Concern for CVA v TIA as symptoms are resolving. Still confused. CT withotu bleed. Neurologist consulted via teleneurology, will dispo accordingly.  Her symptoms resolved while in the emergency department and back to baseline per family showed up later. [place] neurologist and I discussed the need for TPA and felt that not be indicated at this time secondary to resolving symptoms and unclear whether she actually had a focal neurologic deficit or not. I received an EKG that shows she had a prolonged QTc so I added on magnesium checked and started a magnesium bolus. This also brings up whether this could've been a syncopal episode versus seizure activity. Discussed case with medicine and will admit for observation for further workup of these etiologies.  Merrily Pew, MD 09/24/15 [phone]

## 2015-09-23 ENCOUNTER — Observation Stay (HOSPITAL_COMMUNITY): Payer: Medicare HMO

## 2015-09-23 ENCOUNTER — Observation Stay (HOSPITAL_BASED_OUTPATIENT_CLINIC_OR_DEPARTMENT_OTHER): Payer: Medicare HMO

## 2015-09-23 ENCOUNTER — Observation Stay (HOSPITAL_COMMUNITY)
Admit: 2015-09-23 | Discharge: 2015-09-23 | Disposition: A | Discharge status: Home / Self Care | Payer: Medicare HMO | Attending: Internal Medicine | Admitting: Internal Medicine

## 2015-09-23 DIAGNOSIS — G9340 Encephalopathy, unspecified: Secondary | ICD-10-CM

## 2015-09-23 DIAGNOSIS — I6350 Cerebral infarction due to unspecified occlusion or stenosis of unspecified cerebral artery: Secondary | ICD-10-CM | POA: Diagnosis not present

## 2015-09-23 DIAGNOSIS — G8194 Hemiplegia, unspecified affecting left nondominant side: Secondary | ICD-10-CM

## 2015-09-23 DIAGNOSIS — I10 Essential (primary) hypertension: Secondary | ICD-10-CM | POA: Diagnosis not present

## 2015-09-23 DIAGNOSIS — E785 Hyperlipidemia, unspecified: Secondary | ICD-10-CM

## 2015-09-23 DIAGNOSIS — E119 Type 2 diabetes mellitus without complications: Secondary | ICD-10-CM

## 2015-09-23 DIAGNOSIS — E440 Moderate protein-calorie malnutrition: Secondary | ICD-10-CM

## 2015-09-23 DIAGNOSIS — R410 Disorientation, unspecified: Secondary | ICD-10-CM | POA: Diagnosis not present

## 2015-09-23 LAB — COMPREHENSIVE METABOLIC PANEL
ALBUMIN: 3.1 g/dL — AB (ref 3.5–5.0)
ALK PHOS: 49 U/L (ref 38–126)
ALT: 37 U/L (ref 14–54)
ANION GAP: 7 (ref 5–15)
AST: 21 U/L (ref 15–41)
BILIRUBIN TOTAL: 0.4 mg/dL (ref 0.3–1.2)
BUN: 14 mg/dL (ref 6–20)
CALCIUM: 9.3 mg/dL (ref 8.9–10.3)
CO2: 27 mmol/L (ref 22–32)
Chloride: 107 mmol/L (ref 101–111)
Creatinine, Ser: 0.6 mg/dL (ref 0.44–1.00)
GFR calc Af Amer: >60 mL/min (ref >60)
GFR calc non Af Amer: >60 mL/min (ref >60)
GLUCOSE: 160 mg/dL — AB (ref 65–99)
Potassium: 3.2 mmol/L — ABNORMAL LOW (ref 3.5–5.1)
Sodium: 141 mmol/L (ref 135–145)
TOTAL PROTEIN: 6.5 g/dL (ref 6.5–8.1)

## 2015-09-23 LAB — GLUCOSE, CAPILLARY
GLUCOSE-CAPILLARY: 159 mg/dL — AB (ref 65–99)
GLUCOSE-CAPILLARY: 126 mg/dL — AB (ref 65–99)
GLUCOSE-CAPILLARY: 140 mg/dL — AB (ref 65–99)
GLUCOSE-CAPILLARY: 204 mg/dL — AB (ref 65–99)

## 2015-09-23 LAB — LIPID PANEL
CHOL/HDL RATIO: 4.8 ratio
CHOLESTEROL: 173 mg/dL (ref 0–200)
HDL: 36 mg/dL — AB (ref >40)
LDL Cholesterol: 118 mg/dL — ABNORMAL HIGH (ref 0–99)
Triglycerides: 96 mg/dL (ref <150)
VLDL: 19 mg/dL (ref 0–40)

## 2015-09-23 LAB — CBC
HCT: 30 % — ABNORMAL LOW (ref 36.0–46.0)
HEMOGLOBIN: 10.6 g/dL — AB (ref 12.0–15.0)
MCH: 30 pg (ref 26.0–34.0)
MCHC: 35.3 g/dL (ref 30.0–36.0)
MCV: 85 fL (ref 78.0–100.0)
PLATELETS: 362 10*3/uL (ref 150–400)
RBC: 3.53 MIL/uL — ABNORMAL LOW (ref 3.87–5.11)
RDW: 12.6 % (ref 11.5–15.5)
WBC: 5.5 10*3/uL (ref 4.0–10.5)

## 2015-09-23 LAB — ECHOCARDIOGRAM COMPLETE
Height: 65 in
WEIGHTICAEL: 1901.25 [oz_av]

## 2015-09-23 LAB — HIV ANTIBODY (ROUTINE TESTING W REFLEX): HIV Screen 4th Generation wRfx: NONREACTIVE

## 2015-09-23 LAB — HEPATITIS C ANTIBODY: HCV Ab: <0.1 s/co ratio (ref 0.0–0.9)

## 2015-09-23 LAB — HEPATITIS B SURFACE ANTIGEN: HEP B S AG: NEGATIVE

## 2015-09-23 MED ORDER — LORAZEPAM 0.5 MG PO TABS
0.5000 mg | ORAL_TABLET | Freq: Once | ORAL | Status: AC
  Administered 2015-09-23: 0.5 mg via ORAL
  Filled 2015-09-23: qty 1

## 2015-09-23 MED ORDER — LISINOPRIL 10 MG PO TABS
20.0000 mg | ORAL_TABLET | Freq: Every day | ORAL | Status: DC
  Administered 2015-09-23 – 2015-09-24 (×2): 20 mg via ORAL
  Filled 2015-09-23 (×2): qty 2

## 2015-09-23 MED ORDER — POTASSIUM CHLORIDE CRYS ER 20 MEQ PO TBCR
40.0000 meq | EXTENDED_RELEASE_TABLET | Freq: Once | ORAL | Status: AC
  Administered 2015-09-23: 40 meq via ORAL
  Filled 2015-09-23: qty 2

## 2015-09-23 MED ORDER — AMLODIPINE BESYLATE 5 MG PO TABS
10.0000 mg | ORAL_TABLET | Freq: Every day | ORAL | Status: DC
  Administered 2015-09-23 – 2015-09-24 (×2): 10 mg via ORAL
  Filled 2015-09-23 (×2): qty 2

## 2015-09-23 MED ORDER — ENSURE ENLIVE PO LIQD
237.0000 mL | Freq: Two times a day (BID) | ORAL | Status: DC
  Administered 2015-09-24: 237 mL via ORAL

## 2015-09-23 NOTE — Consult Note (Signed)
Gregg A. Merlene Laughter, MD     [email or web address]          Maria Parrish is an 70 y.o. female.   ASSESSMENT/PLAN:  1. Acute focal neurological deficit with left-sided numbness and weakness associated however with confusion. Differential diagnosis includes complex partial seizures and transient ischemic attack. 2. Baseline cognitive impairment. 3. Gait impairment. 4. Ventriculomegaly with some suggestion of possible normal pressure hydrocephalus?????   RECOMMENDATION: Agree with antiplatelet agent aspirin. Follow up stroke workup/TIA workup. EEG.   The patient is a 70 year old black female who was recently hospitalized for acute encephalopathy thought to be due to urinary tract infection. She was just discharged a couple days ago and presented again with the focal neurological deficits. Specifically with left-sided hemiparesis and numbness associated with left facial weakness. The nephew who is at the bedside thinks that the spell lasted for about 20 minutes although he is not quite certain. It appears that the spell was associated with significant confusion. On the last encounter, she had a significant improvement in cognition after she was treated for the urinary tract infection. Her cognition has improved but she has not baseline. She does not complain of issues at this time. She does not report focal numbness or weakness at this time. There is no headaches, dizziness, reports of dysarthria, chest pain or shortness of breath. The review of systems is otherwise unremarkable.  GENERAL: Pleasant thin and in no acute distress.  HEENT: Supple. Atraumatic normocephalic.   ABDOMEN: soft  EXTREMITIES: No edema   BACK: Normal.  SKIN: Normal by inspection.    MENTAL STATUS: The patient is alert and cooperative with evaluation. She thinks it is 45. She thinks that she is at her relative's house. She does follow commands well.  CRANIAL NERVES: Pupils are equal, round  and reactive to light and accommodation; extra ocular movements are full, there is no significant nystagmus; visual fields are full; upper and lower facial muscles are normal in strength and symmetric, there is no flattening of the nasolabial folds; tongue is midline; uvula is midline; shoulder elevation is normal.  MOTOR: Normal tone, bulk and strength; no pronator drift.  COORDINATION: Left finger to nose is normal, right finger to nose is normal, No rest tremor; no intention tremor; no postural tremor; no bradykinesia.  REFLEXES: Deep tendon reflexes are symmetrical and normal. Babinski reflexes are flexor bilaterally.   SENSATION: Normal to pain.     Blood pressure 180/83, pulse 72, temperature 98.5 F (36.9 C), temperature source Oral, resp. rate 18, height '5\' 5"'  (1.651 m), weight 53.9 kg (118 lb 13.3 oz), SpO2 100 %.  Past Medical History  Diagnosis Date  . Diabetes mellitus without complication ([place])   . Hypertension   . Hyperlipidemia   . Hypercalcemia   . Hyperparathyroidism ([place])   . Confusion   . Assistance needed for mobility     walks with walker  . Peripheral neuropathy ([place])   . UTI (lower urinary tract infection)   . Moderate protein-calorie malnutrition ([place])   . Hypertension   . Dementia   . Generalized weakness     Past Surgical History  Procedure Laterality Date  . None    . Colonoscopy N/A 08/12/2015    Procedure: COLONOSCOPY;  Surgeon: Daneil Dolin, MD;  Location: [facility];  Service: Endoscopy;  Laterality: N/A;  1245pm  . Esophagogastroduodenoscopy N/A 08/12/2015    Procedure: ESOPHAGOGASTRODUODENOSCOPY (EGD);  Surgeon: Daneil Dolin, MD;  Location: [facility];  Service: Endoscopy;  Laterality: N/A;    Family History  Problem Relation Age of Onset  . Diabetes Father   . Heart attack Mother   . Colon cancer Neg Hx   . Hypertension Brother   . Hypertension Brother   . Diabetes Brother     Social History:  reports that she has never  smoked. She does not have any smokeless tobacco history on file. She reports that she does not drink alcohol or use illicit drugs.  Allergies:  Allergies  Allergen Reactions  . Penicillins Other (See Comments)    Makes patient sore.  Has patient had a PCN reaction causing immediate rash, facial/tongue/throat swelling, SOB or lightheadedness with hypotension: No Has patient had a PCN reaction causing severe rash involving mucus membranes or skin necrosis: No Has patient had a PCN reaction that required hospitalization No Has patient had a PCN reaction occurring within the last 10 years: No If all of the above answers are "NO", then may proceed with Cephalosporin use.   . Statins Other (See Comments)    Makes wrists and fingers ache    Medications: Prior to Admission medications   Medication Sig Start Date End Date Taking? Authorizing Provider  cyanocobalamin (,VITAMIN B-12,) 1000 MCG/ML injection Inject 1 mL (1,000 mcg total) into the muscle once. Patient not taking: Reported on 09/22/2015 09/19/15   Nita Sells, MD  folic acid (FOLVITE) 1 MG tablet Take 1 tablet (1 mg total) by mouth daily. 09/19/15   Nita Sells, MD  glipiZIDE (GLUCOTROL) 10 MG tablet Take 1 tablet (10 mg total) by mouth daily before breakfast. 09/09/15   Cassandria Anger, MD  lisinopril (PRINIVIL,ZESTRIL) 20 MG tablet Take 1 tablet (20 mg total) by mouth daily. 03/27/15   Kathie Dike, MD  metFORMIN (GLUCOPHAGE) 500 MG tablet Take 1 tablet (500 mg total) by mouth 2 (two) times daily. 03/27/15   Kathie Dike, MD  polyethylene glycol (MIRALAX / GLYCOLAX) packet Take 17 g by mouth daily as needed for mild constipation. 09/19/15   Nita Sells, MD  Vitamin D, Ergocalciferol, (DRISDOL) 50000 units CAPS capsule Take 1 capsule (50,000 Units total) by mouth every 7 (seven) days. 09/09/15   Cassandria Anger, MD    Scheduled Meds: . aspirin  300 mg Rectal Daily   Or  . aspirin  325 mg Oral Daily  .  enoxaparin (LOVENOX) injection  40 mg Subcutaneous Q24H  . folic acid  1 mg Oral Daily  . insulin aspart  0-5 Units Subcutaneous QHS  . insulin aspart  0-9 Units Subcutaneous TID WC  . vitamin B-12  500 mcg Oral Daily  . [START ON 09/26/2015] Vitamin D (Ergocalciferol)  50,000 Units Oral Q7 days   Continuous Infusions: . 0.9 % NaCl with KCl 20 mEq / L 75 mL/hr at 09/22/15 1808   PRN Meds:.acetaminophen **OR** acetaminophen, hydrALAZINE, ondansetron (ZOFRAN) IV, senna-docusate     Results for orders placed or performed during the hospital encounter of 09/22/15 (from the past 48 hour(s))  Ethanol     Status: None   Collection Time: 09/22/15  1:17 PM  Result Value Ref Range   Alcohol, Ethyl (B) <5 <5 mg/dL    Comment:        LOWEST DETECTABLE LIMIT FOR SERUM ALCOHOL IS 5 mg/dL FOR MEDICAL PURPOSES ONLY   Protime-INR     Status: None   Collection Time: 09/22/15  1:17 PM  Result Value Ref Range   Prothrombin Time 13.3 11.6 - 15.2 seconds  INR 0.99 0.00 - 1.49  APTT     Status: None   Collection Time: 09/22/15  1:17 PM  Result Value Ref Range   aPTT 25 24 - 37 seconds  CBC     Status: None   Collection Time: 09/22/15  1:17 PM  Result Value Ref Range   WBC 6.4 4.0 - 10.5 K/uL   RBC 4.24 3.87 - 5.11 MIL/uL   Hemoglobin 12.7 12.0 - 15.0 g/dL   HCT 36.2 36.0 - 46.0 %   MCV 85.4 78.0 - 100.0 fL   MCH 30.0 26.0 - 34.0 pg   MCHC 35.1 30.0 - 36.0 g/dL   RDW 12.7 11.5 - 15.5 %   Platelets 381 150 - 400 K/uL  Differential     Status: None   Collection Time: 09/22/15  1:17 PM  Result Value Ref Range   Neutrophils Relative % 54 %   Neutro Abs 3.5 1.7 - 7.7 K/uL   Lymphocytes Relative 34 %   Lymphs Abs 2.1 0.7 - 4.0 K/uL   Monocytes Relative 8 %   Monocytes Absolute 0.5 0.1 - 1.0 K/uL   Eosinophils Relative 4 %   Eosinophils Absolute 0.2 0.0 - 0.7 K/uL   Basophils Relative 0 %   Basophils Absolute 0.0 0.0 - 0.1 K/uL  Comprehensive metabolic panel     Status: Abnormal    Collection Time: 09/22/15  1:17 PM  Result Value Ref Range   Sodium 138 135 - 145 mmol/L   Potassium 3.7 3.5 - 5.1 mmol/L   Chloride 103 101 - 111 mmol/L   CO2 26 22 - 32 mmol/L   Glucose, Bld 183 (H) 65 - 99 mg/dL   BUN 14 6 - 20 mg/dL   Creatinine, Ser 0.85 0.44 - 1.00 mg/dL   Calcium 9.8 8.9 - 10.3 mg/dL   Total Protein 7.9 6.5 - 8.1 g/dL   Albumin 3.7 3.5 - 5.0 g/dL   AST 37 15 - 41 U/L   ALT 51 14 - 54 U/L   Alkaline Phosphatase 50 38 - 126 U/L   Total Bilirubin 0.6 0.3 - 1.2 mg/dL   GFR calc non Af Amer >60 >60 mL/min   GFR calc Af Amer >60 >60 mL/min    Comment: (NOTE) The eGFR has been calculated using the CKD EPI equation. This calculation has not been validated in all clinical situations. eGFR's persistently <60 mL/min signify possible Chronic Kidney Disease.    Anion gap 9 5 - 15  Magnesium     Status: Abnormal   Collection Time: 09/22/15  1:17 PM  Result Value Ref Range   Magnesium 1.5 (L) 1.7 - 2.4 mg/dL  CK     Status: None   Collection Time: 09/22/15  1:17 PM  Result Value Ref Range   Total CK 52 38 - 234 U/L  Vitamin B12     Status: None   Collection Time: 09/22/15  1:17 PM  Result Value Ref Range   Vitamin B-12 443 180 - 914 pg/mL    Comment: (NOTE) This assay is not validated for testing neonatal or myeloproliferative syndrome specimens for Vitamin B12 levels. Performed at [facility]   CBG monitoring, ED     Status: Abnormal   Collection Time: 09/22/15  1:49 PM  Result Value Ref Range   Glucose-Capillary 166 (H) 65 - 99 mg/dL  I-stat troponin, ED (not at [facility])     Status: None   Collection Time: 09/22/15  2:21 PM  Result Value Ref Range   Troponin i, poc 0.00 0.00 - 0.08 ng/mL   Comment 3            Comment: Due to the release kinetics of cTnI, a negative result within the first hours of the onset of symptoms does not rule out myocardial infarction with certainty. If myocardial infarction is still suspected, repeat the test at  appropriate intervals.   Urine rapid drug screen (hosp performed)not at [facility]     Status: None   Collection Time: 09/22/15  2:40 PM  Result Value Ref Range   Opiates NONE DETECTED NONE DETECTED   Cocaine NONE DETECTED NONE DETECTED   Benzodiazepines NONE DETECTED NONE DETECTED   Amphetamines NONE DETECTED NONE DETECTED   Tetrahydrocannabinol NONE DETECTED NONE DETECTED   Barbiturates NONE DETECTED NONE DETECTED    Comment:        DRUG SCREEN FOR MEDICAL PURPOSES ONLY.  IF CONFIRMATION IS NEEDED FOR ANY PURPOSE, NOTIFY LAB WITHIN 5 DAYS.        LOWEST DETECTABLE LIMITS FOR URINE DRUG SCREEN Drug Class       Cutoff (ng/mL) Amphetamine      1000 Barbiturate      200 Benzodiazepine   800 Tricyclics       349 Opiates          300 Cocaine          300 THC              50   Urinalysis, Routine w reflex microscopic (not at [facility])     Status: Abnormal   Collection Time: 09/22/15  2:40 PM  Result Value Ref Range   Color, Urine YELLOW YELLOW   APPearance CLEAR CLEAR   Specific Gravity, Urine >1.030 (H) 1.005 - 1.030   pH 6.0 5.0 - 8.0   Glucose, UA NEGATIVE NEGATIVE mg/dL   Hgb urine dipstick NEGATIVE NEGATIVE   Bilirubin Urine NEGATIVE NEGATIVE   Ketones, ur TRACE (A) NEGATIVE mg/dL   Protein, ur 100 (A) NEGATIVE mg/dL   Nitrite NEGATIVE NEGATIVE   Leukocytes, UA NEGATIVE NEGATIVE  Urine microscopic-add on     Status: Abnormal   Collection Time: 09/22/15  2:40 PM  Result Value Ref Range   Squamous Epithelial / LPF 6-30 (A) NONE SEEN   WBC, UA 0-5 0 - 5 WBC/hpf   RBC / HPF 0-5 0 - 5 RBC/hpf   Bacteria, UA RARE (A) NONE SEEN  HIV antibody     Status: None   Collection Time: 09/22/15  4:45 PM  Result Value Ref Range   HIV Screen 4th Generation wRfx Non Reactive Non Reactive    Comment: (NOTE) Performed At: [facility] [place], [place] [id number] Lindon Romp MD [id number]   Hepatitis C antibody     Status: None   Collection Time: 09/22/15   4:45 PM  Result Value Ref Range   HCV Ab <0.1 0.0 - 0.9 s/co ratio    Comment: (NOTE)                                  Negative:     < 0.8                             Indeterminate: 0.8 - 0.9  Positive:     > 0.9 The CDC recommends that a positive HCV antibody result be followed up with a HCV Nucleic Acid Amplification test (196222). Performed At: [facility] [place], [place] [id number] Lindon Romp MD ER:[id number]   Hepatitis B surface antigen     Status: None   Collection Time: 09/22/15  4:52 PM  Result Value Ref Range   Hepatitis B Surface Ag Negative Negative    Comment: (NOTE) Performed At: [facility] [place], [place] [id number] Lindon Romp MD OV:[id number]   Glucose, capillary     Status: Abnormal   Collection Time: 09/22/15  5:07 PM  Result Value Ref Range   Glucose-Capillary 119 (H) 65 - 99 mg/dL   Comment 1 Notify RN   Glucose, capillary     Status: Abnormal   Collection Time: 09/22/15  8:24 PM  Result Value Ref Range   Glucose-Capillary 191 (H) 65 - 99 mg/dL   Comment 1 Notify RN    Comment 2 Document in Chart   Urinalysis, Routine w reflex microscopic (not at [facility])     Status: Abnormal   Collection Time: 09/22/15  9:58 PM  Result Value Ref Range   Color, Urine YELLOW YELLOW   APPearance CLEAR CLEAR   Specific Gravity, Urine >1.030 (H) 1.005 - 1.030   pH 5.5 5.0 - 8.0   Glucose, UA 100 (A) NEGATIVE mg/dL   Hgb urine dipstick NEGATIVE NEGATIVE   Bilirubin Urine NEGATIVE NEGATIVE   Ketones, ur 15 (A) NEGATIVE mg/dL   Protein, ur 100 (A) NEGATIVE mg/dL   Nitrite NEGATIVE NEGATIVE   Leukocytes, UA NEGATIVE NEGATIVE  Urine microscopic-add on     Status: Abnormal   Collection Time: 09/22/15  9:58 PM  Result Value Ref Range   Squamous Epithelial / LPF 6-30 (A) NONE SEEN   WBC, UA 0-5 0 - 5 WBC/hpf   RBC / HPF 0-5 0 - 5 RBC/hpf   Bacteria, UA FEW (A) NONE SEEN  Lipid  panel     Status: Abnormal   Collection Time: 09/23/15  5:37 AM  Result Value Ref Range   Cholesterol 173 0 - 200 mg/dL   Triglycerides 96 <150 mg/dL   HDL 36 (L) >40 mg/dL   Total CHOL/HDL Ratio 4.8 RATIO   VLDL 19 0 - 40 mg/dL   LDL Cholesterol 118 (H) 0 - 99 mg/dL    Comment:        Total Cholesterol/HDL:CHD Risk Coronary Heart Disease Risk Table                     Men   Women  1/2 Average Risk   3.4   3.3  Average Risk       5.0   4.4  2 X Average Risk   9.6   7.1  3 X Average Risk  23.4   11.0        Use the calculated Patient Ratio above and the CHD Risk Table to determine the patient's CHD Risk.        ATP III CLASSIFICATION (LDL):  <100     mg/dL   Optimal  100-129  mg/dL   Near or Above                    Optimal  130-159  mg/dL   Borderline  160-189  mg/dL   High  >190     mg/dL   Very  High   Comprehensive metabolic panel     Status: Abnormal   Collection Time: 09/23/15  5:37 AM  Result Value Ref Range   Sodium 141 135 - 145 mmol/L   Potassium 3.2 (L) 3.5 - 5.1 mmol/L   Chloride 107 101 - 111 mmol/L   CO2 27 22 - 32 mmol/L   Glucose, Bld 160 (H) 65 - 99 mg/dL   BUN 14 6 - 20 mg/dL   Creatinine, Ser 0.60 0.44 - 1.00 mg/dL   Calcium 9.3 8.9 - 10.3 mg/dL   Total Protein 6.5 6.5 - 8.1 g/dL   Albumin 3.1 (L) 3.5 - 5.0 g/dL   AST 21 15 - 41 U/L   ALT 37 14 - 54 U/L   Alkaline Phosphatase 49 38 - 126 U/L   Total Bilirubin 0.4 0.3 - 1.2 mg/dL   GFR calc non Af Amer >60 >60 mL/min   GFR calc Af Amer >60 >60 mL/min    Comment: (NOTE) The eGFR has been calculated using the CKD EPI equation. This calculation has not been validated in all clinical situations. eGFR's persistently <60 mL/min signify possible Chronic Kidney Disease.    Anion gap 7 5 - 15  CBC     Status: Abnormal   Collection Time: 09/23/15  5:37 AM  Result Value Ref Range   WBC 5.5 4.0 - 10.5 K/uL   RBC 3.53 (L) 3.87 - 5.11 MIL/uL   Hemoglobin 10.6 (L) 12.0 - 15.0 g/dL   HCT 30.0 (L) 36.0 -  46.0 %   MCV 85.0 78.0 - 100.0 fL   MCH 30.0 26.0 - 34.0 pg   MCHC 35.3 30.0 - 36.0 g/dL   RDW 12.6 11.5 - 15.5 %   Platelets 362 150 - 400 K/uL  Glucose, capillary     Status: Abnormal   Collection Time: 09/23/15  7:17 AM  Result Value Ref Range   Glucose-Capillary 159 (H) 65 - 99 mg/dL   Comment 1 Notify RN    Comment 2 Document in Chart     Studies/Results:   MRI/MRA BRAIN  FINDINGS: The study is moderately degraded by patient motion. Signal on the branch vessels is severely degraded.  The internal carotid arteries are within normal limits to the ICA termini bilaterally. There is some signal loss within the proximal left A1 segment compared to the right. The anterior communicating artery is patent. The MCA bifurcations are intact. Proximal signal loss in the MCA branch vessels is likely artifactual. There is some attenuation of distal MCA branch vessels, worse on the left.  The vertebral arteries are codominant. Signal loss in the proximal basilar artery appears artifactual. The distal basilar signal is normal. Both posterior cerebral arteries originate from the basilar tip. There is attenuation of PCA branch vessels bilaterally.  IMPRESSION: 1. Moderate diffuse small vessel disease. 2. The study is moderately degraded by patient motion. 3. Asymmetric narrowing of the left A1 segment compared to the right.    CAROTID DOPPLERS Color duplex indicates minimal heterogeneous and calcified plaque, with no hemodynamically significant stenosis by duplex criteria in the extracranial cerebrovascular circulation.    TTE - Left ventricle: The cavity size was normal. Wall thickness was  normal. Systolic function was normal. The estimated ejection  fraction was in the range of 55% to 60%. Wall motion was normal;  there were no regional wall motion abnormalities. Doppler  parameters are consistent with abnormal left ventricular  relaxation (grade 1 diastolic  dysfunction). - Aortic valve: Valve area (  VTI): 2.09 cm^2. Valve area (Vmax):  2.04 cm^2. - Mitral valve: There was mild regurgitation. - Left atrium: The atrium was mildly dilated. - Right atrium: The atrium was mildly dilated. - Pulmonary arteries: Systolic pressure was mildly increased. PA  peak pressure: 34 mm Hg (S).   Sanika Brosious A. Merlene Laughter, M.D.  Diplomate, Tax adviser of Psychiatry and Neurology ( Neurology). 09/23/2015, 8:38 AM

## 2015-09-23 NOTE — Progress Notes (Signed)
Progress Note   Maria Parrish [id number] DOB: 02/24/1946 DOA: 09/22/2015 PCP: Shade Flood, MD   Brief Narrative:   Maria Parrish is an 70 y.o. female with a PMH of dementia, hypertension, hyperparathyroidism, type 2 diabetes and hyperlipidemia who was admitted 09/22/15 with a change in mental status, left hemiparesis and a left sided facial droop. The patient was recently discharged from the hospital after a prolonged stay from 09/14/2015 through 09/19/2015 for acute encephalopathy. She was ultimately just discharged to SNF, [facility]. It was thought during that time that UTI may have contributed to her confusion although there was concern for possible NPH. Neurology was consulted, but did not feel the patient's MRI was impressive for NPH. Extensive workup was obtained and found the patient to have a low B12 level. She was started on intramuscular B12. Unfortunately, it is unclear when her last dose was. The patient was treated with 3 days of antibiotics. Additional workup including ammonia, RPR, B12, homocysteine, ANA were unremarkable. Sedimentation rate was 70. In the ED, CT of the brain was negative.  Assessment/Plan:   Principal problem:  Acute left hemiparesis  - Symptoms resolved at the time of presentation to the emergency department. - 09/15/2015 MRI brain negative for acute infarct which showed progressive mild ventricular dilatation when compared to MRI in 2010   - Repeat MRI brain showed no significant change in ventricular enlargement, no evidence of acute stroke. - Continue aspirin.  - PT/OT/ST evaluations requested. - F/U Carotid ultrasound.  - F/U Echocardiogram.   Acute encephalopathy - UA negative for nitrites and leukocytes, follow-up urine culture. Chest x-ray clear. - CT and MRI of the brain negative for acute findings, but there is again question of hydrocephalus. - 09/14/2015 B12 158, now WNL at 443. - Continue to hydrate. Control BP, ? Hypertensive  encephalopathy. - Follow-up EEG. - F/U neurology consultation. - Suspect underlying dementia.  Active problems: Hypokalemia - Replete.  Diabetes mellitus type 2  - Continue NovoLog insulin sensitive sliding scale. CBGs [id number]. - Metformin and glipizide on hold.  Hypertension  - Hydralazine prn for SBP >200. Resume lisinopril. Add Norvasc.  Hyperparathyroidism - Corrected calcium approx 9.9.  Weight loss/moderate malnutrition - 08/12/2015 EGD and colonoscopy unremarkable except for diverticulosis and small hiatus hernia - Nutrition consult. - Hepatitis B surface antigen, hepatitis C antibody, HIV.    DVT Prophylaxis - Lovenox ordered.   Family Communication/Anticipated D/C date and plan/Code Status   Family Communication: No family at the bedside. Disposition Plan/date: From SNF, but son now wants to take her home, symptoms fully evaluated, likely another 24-48 hours. Code Status: Full code.   IV Access:    Peripheral IV   Procedures and diagnostic studies:   Dg Chest 2 View  09/22/2015  CLINICAL DATA:  Left facial droop, confusion, history of dementia EXAM: CHEST  2 VIEW COMPARISON:  Chest x-ray of 09/14/2015 FINDINGS: No active infiltrate or effusion is seen. Mediastinal and hilar contours are unremarkable. The heart is mildly enlarged and stable. No acute bony abnormality is seen. IMPRESSION: No active lung disease.  Stable mild cardiomegaly. Electronically Signed   By: Ivar Drape M.D.   On: 09/22/2015 16:50   Ct Head Wo Contrast  09/22/2015  ADDENDUM REPORT: 09/22/2015 13:50 ADDENDUM: Critical Value/emergent results were called by telephone at the time of interpretation on 09/22/2015 at 1:35 pm to Dr. Merrily Pew , who verbally acknowledged these results. Electronically Signed   By: Elodia Florence.D.  On: 09/22/2015 13:50  09/22/2015  CLINICAL DATA:  Weakness, left-sided deficits, code stroke EXAM: CT HEAD WITHOUT CONTRAST TECHNIQUE: Contiguous axial images were  obtained from the base of the skull through the vertex without intravenous contrast. COMPARISON:  09/15/2015, 09/14/2015 FINDINGS: Moderate diffuse cortical atrophy and low attenuation in the deep white matter. No evidence of focal vascular territory infarct or mass. No parenchymal hemorrhage or extra-axial fluid. Ventricles prominent in proportion to atrophy. Calvarium is intact. Visualized portions of paranasal sinuses clear. IMPRESSION: Chronic involutional change with no acute findings Electronically Signed: By: Skipper Cliche M.D. On: 09/22/2015 13:32   Mr Jodene Nam Head Wo Contrast  09/22/2015  CLINICAL DATA:  Acute encephalopathy. EXAM: MRA HEAD WITHOUT CONTRAST TECHNIQUE: Angiographic images of the Circle of Willis were obtained using MRA technique without intravenous contrast. COMPARISON:  MRI brain from the same day.  MRI brain 09/15/2015. FINDINGS: The study is moderately degraded by patient motion. Signal on the branch vessels is severely degraded. The internal carotid arteries are within normal limits to the ICA termini bilaterally. There is some signal loss within the proximal left A1 segment compared to the right. The anterior communicating artery is patent. The MCA bifurcations are intact. Proximal signal loss in the MCA branch vessels is likely artifactual. There is some attenuation of distal MCA branch vessels, worse on the left. The vertebral arteries are codominant. Signal loss in the proximal basilar artery appears artifactual. The distal basilar signal is normal. Both posterior cerebral arteries originate from the basilar tip. There is attenuation of PCA branch vessels bilaterally. IMPRESSION: 1. Moderate diffuse small vessel disease. 2. The study is moderately degraded by patient motion. 3. Asymmetric narrowing of the left A1 segment compared to the right. Electronically Signed   By: San Morelle M.D.   On: 09/22/2015 20:35   Mr Brain Wo Contrast  09/22/2015  CLINICAL DATA:  Acute  encephalopathy. EXAM: MRI HEAD WITHOUT CONTRAST TECHNIQUE: Multiplanar, multiecho pulse sequences of the brain and surrounding structures were obtained without intravenous contrast. COMPARISON:  CT head without contrast from the same day. MRI brain 09/15/2015. FINDINGS: The study is moderately degraded by patient motion. Moderate generalized atrophy is again noted. The ventricles are enlarged. Periventricular T2 changes are noted bilaterally. There is no significant interval change in size of the ventricles since the prior exam. No acute hemorrhage or mass lesion is present. There is no acute infarct. Flow is present in the major intracranial arteries. The globes and orbits are intact. The brainstem and cerebellum are normal. The internal auditory canals are within normal limits. Left greater than right ethmoid sinus disease is present. IMPRESSION: 1. No significant change in ventricular enlargement. Hydrocephalus is still question. 2. No other acute intracranial abnormality or significant interval change. 3. Left ethmoid sinus disease. Electronically Signed   By: San Morelle M.D.   On: 09/22/2015 20:32     Medical Consultants:    Neurology  Anti-Infectives:   Anti-infectives    None      Subjective:   Ewa M Fera reports speech difficulties for several weeks---describes it as slurred.  She denies headache.  Cannot say whether she feels weak on the left side today.   Objective:    Filed Vitals:   09/23/15 0011 09/23/15 0211 09/23/15 0411 09/23/15 0811  BP: 164/74 160/73 158/71 180/83  Pulse: 82 87 88 72  Temp: 98.1 F (36.7 C) 98.1 F (36.7 C) 98.2 F (36.8 C) 98.5 F (36.9 C)  TempSrc: Oral Oral Oral Oral  Resp: 14 16 16 18   Height:      Weight:      SpO2: 100% 100% 100% 100%    Intake/Output Summary (Last 24 hours) at 09/23/15 0915 Last data filed at 09/22/15 2100  Gross per 24 hour  Intake      0 ml  Output    213 ml  Net   -213 ml   Filed Weights    09/22/15 1611  Weight: 53.9 kg (118 lb 13.3 oz)    Exam: Gen:  NAD, has safety mittens on Cardiovascular:  RRR, No M/R/G Respiratory:  Lungs CTAB Gastrointestinal:  Abdomen soft, NT/ND, + BS Extremities:  No C/E/C Neuro: Alert and oriented to self, hospital.  Answers questions slowly, no facial droop appreciated   Data Reviewed:    Labs: Basic Metabolic Panel:  Recent Labs Lab 09/19/15 0621 09/22/15 1317 09/23/15 0537  NA 140 138 141  K 3.1* 3.7 3.2*  CL 107 103 107  CO2 26 26 27   GLUCOSE 153* 183* 160*  BUN 9 14 14   CREATININE 0.41* 0.85 0.60  CALCIUM 9.1 9.8 9.3  MG  --  1.5*  --    GFR Estimated Creatinine Clearance: 56.5 mL/min (by C-G formula based on Cr of 0.6). Liver Function Tests:  Recent Labs Lab 09/19/15 0621 09/22/15 1317 09/23/15 0537  AST 25 37 21  ALT 29 51 37  ALKPHOS 43 50 49  BILITOT 0.5 0.6 0.4  PROT 6.3* 7.9 6.5  ALBUMIN 3.0* 3.7 3.1*    Recent Labs Lab 09/18/15 1117  AMMONIA 26   Coagulation profile  Recent Labs Lab 09/22/15 1317  INR 0.99    CBC:  Recent Labs Lab 09/18/15 0640 09/22/15 1317 09/23/15 0537  WBC 5.8 6.4 5.5  NEUTROABS  --  3.5  --   HGB 10.7* 12.7 10.6*  HCT 29.6* 36.2 30.0*  MCV 85.1 85.4 85.0  PLT 261 381 362   Cardiac Enzymes:  Recent Labs Lab 09/17/15 0624 09/22/15 1317  CKTOTAL 123 52  CKMB 2.2  --    BNP (last 3 results) No results for input(s): PROBNP in the last 8760 hours. CBG:  Recent Labs Lab 09/20/15 1607 09/22/15 1349 09/22/15 1707 09/22/15 2024 09/23/15 0717  GLUCAP 199* 166* 119* 191* 159*   Lipid Profile:  Recent Labs  09/23/15 0537  CHOL 173  HDL 36*  LDLCALC 118*  TRIG 96  CHOLHDL 4.8   Anemia work up:  Recent Labs  09/22/15 1317  VITAMINB12 443   Sepsis Labs:  Recent Labs Lab 09/18/15 0640 09/22/15 1317 09/23/15 0537  WBC 5.8 6.4 5.5   Microbiology Recent Results (from the past 240 hour(s))  Urine culture     Status: None   Collection  Time: 09/14/15  8:00 PM  Result Value Ref Range Status   Specimen Description URINE, CATHETERIZED  Final   Special Requests NONE  Final   Culture   Final    NO GROWTH 1 DAY Performed at [facility]    Report Status 09/16/2015 FINAL  Final     Medications:   . aspirin  300 mg Rectal Daily   Or  . aspirin  325 mg Oral Daily  . enoxaparin (LOVENOX) injection  40 mg Subcutaneous Q24H  . folic acid  1 mg Oral Daily  . insulin aspart  0-5 Units Subcutaneous QHS  . insulin aspart  0-9 Units Subcutaneous TID WC  . vitamin B-12  500 mcg Oral Daily  . [START  ON 09/26/2015] Vitamin D (Ergocalciferol)  50,000 Units Oral Q7 days   Continuous Infusions: . 0.9 % NaCl with KCl 20 mEq / L 75 mL/hr at 09/22/15 1808    Time spent: 25 minutes.     [facility] Pager [phone]. If unable to reach me by pager, please call my cell phone at [phone].  *Please refer to [email or web address], password [email or web address] to get updated schedule on who will round on this patient, as hospitalists switch teams weekly. If 7PM-7AM, please contact night-coverage at [email or web address], password [email or web address] for any overnight needs.  09/23/2015, 9:15 AM

## 2015-09-23 NOTE — Clinical Social Work Note (Signed)
Clinical Social Work Assessment  Patient Details  Name: Maria Parrish MRN: [id number] Date of Birth: 05/21/1946  Date of referral:  09/23/15               Reason for consult:  Facility Placement                Permission sought to share information with:  Facility Sport and exercise psychologist, Family Supports Permission granted to share information::  Yes, Release of Information Signed  Name::        Agency::     Relationship::     Contact Information:     Housing/Transportation Living arrangements for the past 2 months:  Single Family Home Source of Information:  Adult Children Patient Interpreter Needed:  None Criminal Activity/Legal Involvement Pertinent to Current Situation/Hospitalization:  No - Comment as needed Significant Relationships:  Adult Children Lives with:  Adult Children Do you feel safe going back to the place where you live?  Yes Need for family participation in patient care:  Yes (Comment)  Care giving concerns:  Patient's sons, Maria Parrish and Maria Parrish, have made arrangements for patient to have caregivers in the home with her while they are working.     Social Worker assessment / plan:  Patient went to [facility] on 3/8 after being discharged on 3/7 from [facility].  Per Jackelyn Poling at [facility] Patient arrived at the facility and within 15 minutes had slumped over and was not responsive.  Debbie advised that patient can return to the facility at discharge. CSW spoke with patient's son Maria Parrish, who confirmed Debbie's statements. He stated that his brother, Maria Parrish, was in the process of building a ramp for patient at his home and that they planned on taking patient home at discharge with [facility].  He stated that they had arranged for caregivers to be at home with patient while they worked. Landon spoke with patient's CM regarding in home services and equipment needed.  CSW signing off.   Employment status:  Retired Nurse, adult PT Recommendations:  Not assessed at this  time Information / Referral to community resources:     Patient/Family's Response to care:  Family plans on taking patient home at discharge with [facility] and caregiver assistance.  Patient/Family's Understanding of and Emotional Response to Diagnosis, Current Treatment, and Prognosis:  Family is being educated on demential to gain better understanding of patient's diagnosis, treatment and prognosis.    Emotional Assessment Appearance:  Developmentally appropriate Attitude/Demeanor/Rapport:   (Cooperative) Affect (typically observed):  Accepting Orientation:  Oriented to Self, Oriented to Place Alcohol / Substance use:  Not Applicable Psych involvement (Current and /or in the community):  No (Comment)  Discharge Needs  Concerns to be addressed:  Discharge Planning Concerns Readmission within the last 30 days:  No Current discharge risk:  Chronically ill, Cognitively Impaired Barriers to Discharge:  No Barriers Identified   Ihor Gully, LCSW 09/23/2015, 3:24 PM

## 2015-09-23 NOTE — Care Management Obs Status (Signed)
[facility] NOTIFICATION   Patient Details  Name: AGUSTA CHAMI MRN: [id number] Date of Birth: 16-Oct-1945   Medicare Observation Status Notification Given:  Yes    Sherald Barge, RN 09/23/2015, 3:41 PM

## 2015-09-23 NOTE — Evaluation (Addendum)
Occupational Therapy Evaluation Patient Details Name: ENGLAND SCHUG MRN: [id number] DOB: 08-Jan-1946 Today's Date: 09/23/2015    History of Present Illness 70 year old female from [place], with a history of dementia, hypertension, hyperparathyroidism, diabetes mellitus type 2, hyperlipidemia presented with left hemiparesis and left facial droop according to the patient's family. The patient is pleasantly confused and his ibuprofen any significant history.History is obtained from review of the medical record and speaking with the patient's son. The patient's son stated that he stepped out of her room to go do some paperwork. Shortly thereafter, he was called to come back to the room when a worker at the nursing facility noted that the patient to be less responsive. Apparently the patient was awake, but did not respond to verbal or tactile stimuli. There was no body clenching or tonic-clonic activity noted, but the patient's son thought that he noted some left facial droop. Apparently, there was also concern for left upper and left lower extremity weakness. According to the patient's son, the patient's mental status had improved although not back to baseline at the time she arrived to the emergency department. He felt that her left facial droop had improved in the emergency department.   Clinical Impression   Pt awake, alert, oriented to self & time (day & year, not month). Pt able to follow commands approximately 75% of the time during evaluation, also able to initiate movement on command. Pt demonstrates generalized weakness, however no asymmetry noted. Pt requires set-up and extensive cuing for ADL tasks while seated, is able to complete approximately 50% of tasks & requires assistance for remainder. Pt was able to initiate bed mobility and sit-to-stand transfer, demonstrates posterior lean in both sitting and standing. Recommend pt discharge back to SNF, resuming rehab services. No further acute OT needs  at this time.   *09/23/2015 5:20pm: Pt family now would like to arrange for [facility] services and take pt home rather than returning to SNF. Pt would benefit from [facility] services to increase safety and independence in ADL and functional mobility tasks, pt/caregiver education, and improve BUE strength and activity tolerance.     Follow Up Recommendations  HHOT services; 24/7 supervision   Equipment Recommendations  [facility], hospital bed       Precautions / Restrictions Precautions Precautions: Fall Restrictions Weight Bearing Restrictions: No      Mobility Bed Mobility Overal bed mobility: Needs Assistance Bed Mobility: Supine to Sit;Sit to Supine     Supine to sit: Min assist Sit to supine: Mod assist   General bed mobility comments: Pt required cuing for sequencing throughout tasks  Transfers Overall transfer level: Needs assistance Equipment used: None Transfers: Sit to/from [employer] Transfers Sit to Stand: Mod assist Stand pivot transfers: Mod assist;From elevated surface       General transfer comment: Continues to demonstrate posterior lean, able to correct when instructed to "stand up nice & straight" lasting for approximately 30 seconds before resuming lean.          ADL Overall ADL's : Needs assistance/impaired                     Lower Body Dressing: Moderate assistance;Sitting/lateral leans Lower Body Dressing Details (indicate cue type and reason): Pt able to doff socks, required assistance for donning Toilet Transfer: Moderate assistance;Stand-pivot;BSC   Toileting- Clothing Manipulation and Hygiene: Maximal assistance;Cueing for safety;Cueing for sequencing;Sit to/from stand  Pertinent Vitals/Pain Pain Assessment: No/denies pain     Hand Dominance Right   Extremity/Trunk Assessment Upper Extremity Assessment Upper Extremity Assessment: Generalized weakness (symmetrical)   Lower Extremity Assessment Lower  Extremity Assessment: Defer to PT evaluation   Cervical / Trunk Assessment Cervical / Trunk Assessment:  (poor posture & proprioception)   Communication Communication Communication: Expressive difficulties (confusion )   Cognition Arousal/Alertness: Awake/alert Behavior During Therapy: WFL for tasks assessed/performed Overall Cognitive Status: History of cognitive impairments - at baseline       Memory: Decreased short-term memory                        Home Living Family/patient expects to be discharged to:: Skilled nursing facility                                        Prior Functioning/Environment Level of Independence: Needs assistance  Gait / Transfers Assistance Needed: Pt currently using wheelchair for functional mobility, heavy assistance required for transfers ADL's / Homemaking Assistance Needed: Pt is from [facility], previously at [facility] from 2/28-3/5. Max assist with ADL tasks.         OT Diagnosis: Generalized weakness;Cognitive deficits   OT Problem List: Decreased strength;Decreased activity tolerance;Impaired balance (sitting and/or standing);Decreased cognition;Decreased safety awareness;Decreased knowledge of use of DME or AE;Decreased knowledge of precautions    End of Session Equipment Utilized During Treatment: Gait belt  Activity Tolerance: Patient tolerated treatment well Patient left: in bed;with call bell/phone within reach;with bed alarm set;with nursing/sitter in room   Time: [id number] OT Time Calculation (min): 38 min Charges:  OT General Charges $OT Visit: 1 Procedure OT Evaluation $OT Eval Moderate Complexity: 1 Procedure G-Codes: OT G-codes **NOT FOR INPATIENT CLASS** Functional Assessment Tool Used: clinical judgement Functional Limitation: Self care Self Care Current Status [id number]): At least 80 percent but less than 100 percent impaired, limited or restricted Self Care Goal Status [id number]): At least 80 percent but  less than 100 percent impaired, limited or restricted Self Care Discharge Status [phone]): At least 80 percent but less than 100 percent impaired, limited or restricted  Guadelupe Sabin, OTR/L  [phone]  09/23/2015, 9:02 AM

## 2015-09-23 NOTE — Progress Notes (Signed)
Pt's son asked nurse to page MD to see if there is something pt can have for sleep or agitation. Notified NP.

## 2015-09-23 NOTE — Progress Notes (Signed)
EEG completed; results pending.    

## 2015-09-23 NOTE — Procedures (Signed)
   Davison A. Merlene Laughter, MD     [email or web address]           HISTORY: The patient is a 70 year old female who presents with confusion and focal neurological deficit. The study done to evaluate for seizures as a cause of this episode.   MEDICATIONS: Scheduled Meds: . amLODipine  10 mg Oral Daily  . aspirin  300 mg Rectal Daily   Or  . aspirin  325 mg Oral Daily  . enoxaparin (LOVENOX) injection  40 mg Subcutaneous Q24H  . [START ON 09/24/2015] feeding supplement (ENSURE ENLIVE)  237 mL Oral BID BM  . folic acid  1 mg Oral Daily  . insulin aspart  0-5 Units Subcutaneous QHS  . insulin aspart  0-9 Units Subcutaneous TID WC  . lisinopril  20 mg Oral Daily  . vitamin B-12  500 mcg Oral Daily  . [START ON 09/26/2015] Vitamin D (Ergocalciferol)  50,000 Units Oral Q7 days   Continuous Infusions: . 0.9 % NaCl with KCl 20 mEq / L 75 mL/hr at 09/23/15 1309   PRN Meds:.acetaminophen **OR** acetaminophen, hydrALAZINE, ondansetron (ZOFRAN) IV, senna-docusate  Prior to Admission medications   Medication Sig Start Date End Date Taking? Authorizing Provider  Cyanocobalamin (VITAMIN B-12 PO) Take 1 tablet by mouth daily.   Yes Historical Provider, MD  folic acid (FOLVITE) 1 MG tablet Take 1 tablet (1 mg total) by mouth daily. 09/19/15  Yes Nita Sells, MD  glipiZIDE (GLUCOTROL) 10 MG tablet Take 1 tablet (10 mg total) by mouth daily before breakfast. 09/09/15  Yes Cassandria Anger, MD  lisinopril (PRINIVIL,ZESTRIL) 20 MG tablet Take 1 tablet (20 mg total) by mouth daily. 03/27/15  Yes Kathie Dike, MD  metFORMIN (GLUCOPHAGE) 500 MG tablet Take 1 tablet (500 mg total) by mouth 2 (two) times daily. Patient taking differently: Take 500 mg by mouth daily.  03/27/15  Yes Kathie Dike, MD  polyethylene glycol (MIRALAX / GLYCOLAX) packet Take 17 g by mouth daily as needed for mild constipation. 09/19/15  Yes Nita Sells, MD  Vitamin D, Ergocalciferol, (DRISDOL)  50000 units CAPS capsule Take 1 capsule (50,000 Units total) by mouth every 7 (seven) days. 09/09/15  Yes Cassandria Anger, MD      ANALYSIS: A 16 channel recording using standard 10 20 measurements is conducted for 23 minutes. There is a well-formed posterior dominant rhythm of 8-9 Hz which attenuates with eye opening. There is beta activity observed in the frontal areas. Awake and sleep activities are observed. K complexes and sleep spindles are recorded. Photic stimulation and hyperventilation were not carried out. There is no focal or lateralized slowing. There is no epileptiform activity is observed. There is occasional frontal intermittent rhythmic delta activity observed.   IMPRESSION: This recording is mostly unremarkable. Occasional frontal intermittent rhythmic delta activity is observed. This is typically seen in toxic metabolic encephalopathies. Otherwise, the recording is unremarkable. There are no epileptiform activities observed.       Maria Parrish A. Merlene Laughter, M.D.  Diplomate, Tax adviser of Psychiatry and Neurology ( Neurology).

## 2015-09-23 NOTE — Progress Notes (Signed)
PT Screening Note  Patient Details Name: Maria Parrish MRN: [id number] DOB: [birth year]   Patient Screening:    Reason Eval/Treat Not Completed: PT screened, no needs identified, will sign off. First attempt to see patient, EEG was in room. Second attempt, imaging had just arrived to transport patient for CXR, who were agreeable to allow PT to mobilize patient for OOB assessment. Pt performs med mobility with MinA physical assistance to EOB due to posterior lean, and transfers sit to/from stand with MinA physical assistance due to posterior lean. Pt performs a pivot from bedside to [facility] utilizing 20+ steps smaller than 2" with reluctant RLE weightbearing, requring mod-max physical assistance for weight shifting. Pt is pleasant and conversational, answers all questions with appropriate responses. The pt is familiar to this PT from evaluation here last week. She performs all mobility with slightly less physical assistance required, but maintains consistent impairments including but not limited to memory deficits, difficulty attending to tasks, chronic posterior lean (unable to self correct) and reluctant RLE weightbearing. No acute motor or cognitive deficits identified at this time. 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 No additional services needed at this time. All further PT needs can be met at the next venue of care. Pt is appropriate for [facility] for skilled PT services to address the above deficits that remain consistent with my last evaluation on 09/16/15, to improve indep mobility and reduce caregiver burden.   11:00 AM, 09/23/2015 Etta Grandchild, PT, DPT PRN Physical Therapist at [place] License # [id number] [phone] (wireless)  [phone] (mobile)

## 2015-09-23 NOTE — Care Management Note (Signed)
Case Management Note  Patient Details  Name: Maria Parrish MRN: [id number] Date of Birth: 02/25/1946  Subjective/Objective:                  Pt admitted with ?CVA. Pt was recently DC'd from hospital with [facility] services through [facility]. Pt was ordered WC through [facility], family has not yet picked this up from store but will do so prior to DC. Pt had been accepted into [facility] the day of admission and was sent to hospital after having a change in mental status. Family now wishes for pt to return home with [facility] services. Pt's son Harmon Pier) states he will need Hospital bed and [facility] to care for her. They would like to cont to receive [facility] services and DME through [facility]. Anticipate DC home in 24-48 hrs.   Action/Plan: [name], of [facility], made aware of DME and HH needs and will obtain pt info from chart. CM will cont to provide updates on pt's DC status to [facility]. Will cont to follow.    Expected Discharge Date:   09/24/2015               Expected Discharge Plan:  [place]  In-House Referral:  Clinical Social Work  Discharge planning Services  CM Consult  Post Acute Care Choice:  Durable Medical Equipment, Home Health Choice offered to:  Adult Children  DME Arranged:  Hospital bed, Bedside commode DME Agency:  [place] Arranged:  RN, PT, Nurse's Aide [facility] Agency:  [place]  Status of Service:  In process, will continue to follow  Medicare Important Message Given:    Date Medicare IM Given:    Medicare IM give by:    Date Additional Medicare IM Given:    Additional Medicare Important Message give by:     If discussed at [place] of Stay Meetings, dates discussed:    Additional Comments:  Sherald Barge, RN 09/23/2015, 3:41 PM

## 2015-09-24 DIAGNOSIS — E785 Hyperlipidemia, unspecified: Secondary | ICD-10-CM | POA: Diagnosis not present

## 2015-09-24 DIAGNOSIS — G9340 Encephalopathy, unspecified: Secondary | ICD-10-CM | POA: Diagnosis not present

## 2015-09-24 DIAGNOSIS — R410 Disorientation, unspecified: Secondary | ICD-10-CM | POA: Diagnosis not present

## 2015-09-24 DIAGNOSIS — I10 Essential (primary) hypertension: Secondary | ICD-10-CM | POA: Diagnosis not present

## 2015-09-24 LAB — GLUCOSE, CAPILLARY
GLUCOSE-CAPILLARY: 144 mg/dL — AB (ref 65–99)
Glucose-Capillary: 143 mg/dL — ABNORMAL HIGH (ref 65–99)
Glucose-Capillary: 336 mg/dL — ABNORMAL HIGH (ref 65–99)

## 2015-09-24 MED ORDER — ASPIRIN 325 MG PO TABS: 325.0000 mg | ORAL_TABLET | Freq: Every day | ORAL | Status: DC

## 2015-09-24 MED ORDER — AMLODIPINE BESYLATE 10 MG PO TABS: 10.0000 mg | ORAL_TABLET | Freq: Every day | ORAL | Status: DC

## 2015-09-24 MED ORDER — INSULIN ASPART 100 UNIT/ML ~~LOC~~ SOLN
3.0000 [IU] | Freq: Three times a day (TID) | SUBCUTANEOUS | Status: DC
  Administered 2015-09-24: 3 [IU] via SUBCUTANEOUS

## 2015-09-24 MED ORDER — INSULIN ASPART 100 UNIT/ML ~~LOC~~ SOLN: 0.0000 [IU] | Freq: Every day | SUBCUTANEOUS | Status: DC

## 2015-09-24 MED ORDER — INSULIN ASPART 100 UNIT/ML ~~LOC~~ SOLN
0.0000 [IU] | Freq: Three times a day (TID) | SUBCUTANEOUS | Status: DC
  Administered 2015-09-24: 7 [IU] via SUBCUTANEOUS
  Administered 2015-09-24: 1 [IU] via SUBCUTANEOUS

## 2015-09-24 MED ORDER — METFORMIN HCL 500 MG PO TABS: 500.0000 mg | ORAL_TABLET | Freq: Every day | ORAL | Status: DC

## 2015-09-24 NOTE — Plan of Care (Signed)
Problem: Education: Goal: Knowledge of disease or condition will improve Outcome: Not Progressing Pt has dementia and unable to understand the complexity of the disease process.

## 2015-09-24 NOTE — Care Management Note (Signed)
Case Management Note  Patient Details  Name: Maria Parrish MRN: [id number] Date of Birth: 08-16-1945  Expected Discharge Date:     09/25/2015             Expected Discharge Plan:  [place]  In-House Referral:  Clinical Social Work  Discharge planning Services  CM Consult  Post Acute Care Choice:  Durable Medical Equipment, Home Health Choice offered to:  Adult Children  DME Arranged:  Hospital bed, Bedside commode DME Agency:  [place] Arranged:  RN, PT, Nurse's Aide [facility] Agency:  [place]  Status of Service:  Completed, signed off  Medicare Important Message Given:    Date Medicare IM Given:    Medicare IM give by:    Date Additional Medicare IM Given:    Additional Medicare Important Message give by:     If discussed at [place] of Stay Meetings, dates discussed:    Additional Comments: Anticipate DC home within 24 hrs. [facility] is aware of DC plan. Pt's family aware HH has 48 hours to make their first visit. DME will be delivered to pt's home. No further CM needs anticipated.   Sherald Barge, RN 09/24/2015, 2:35 PM

## 2015-09-24 NOTE — Discharge Instructions (Signed)
Confusion Confusion is the inability to think with your usual speed or clarity. Confusion may come on quickly or slowly over time. How quickly the confusion comes on depends on the cause. Confusion can be due to any number of causes. CAUSES   Concussion, head injury, or head trauma.  Seizures.  Stroke.  Fever.  Brain tumor.  Age related decreased brain function (dementia).  Heightened emotional states like rage or terror.  Mental illness in which the person loses the ability to determine what is real and what is not (hallucinations).  Infections such as a urinary tract infection (UTI).  Toxic effects from alcohol, drugs, or prescription medicines.  Dehydration and an imbalance of salts in the body (electrolytes).  Lack of sleep.  Low blood sugar (diabetes).  Low levels of oxygen from conditions such as chronic lung disorders.  Drug interactions or other medicine side effects.  Nutritional deficiencies, especially niacin, thiamine, vitamin C, or vitamin B.  Sudden drop in body temperature (hypothermia).  Change in routine, such as when traveling or hospitalized. SIGNS AND SYMPTOMS  People often describe their thinking as cloudy or unclear when they are confused. Confusion can also include feeling disoriented. That means you are unaware of where or who you are. You may also not know what the date or time is. If confused, you may also have difficulty paying attention, remembering, and making decisions. Some people also act aggressively when they are confused.  DIAGNOSIS  The medical evaluation of confusion may include:  Blood and urine tests.  X-rays.  Brain and nervous system tests.  Analyzing your brain waves (electroencephalogram or EEG).  Magnetic resonance imaging (MRI) of your head.  Computed tomography (CT) scan of your head.  Mental status tests in which your health care provider may ask many questions. Some of these questions may seem silly or strange,  but they are a very important test to help diagnose and treat confusion. TREATMENT  An admission to the hospital may not be needed, but a person with confusion should not be left alone. Stay with a family member or friend until the confusion clears. Avoid alcohol, pain relievers, or sedative drugs until you have fully recovered. Do not drive until directed by your health care provider. HOME CARE INSTRUCTIONS  What family and friends can do:  To find out if someone is confused, ask the person to state his or her name, age, and the date. If the person is unsure or answers incorrectly, he or she is confused.  Always introduce yourself, no matter how well the person knows you.  Often remind the person of his or her location.  Place a calendar and clock near the confused person.  Help the person with his or her medicines. You may want to use a pill box, an alarm as a reminder, or give the person each dose as prescribed.  Talk about current events and plans for the day.  Try to keep the environment calm, quiet, and peaceful.  Make sure the person keeps follow-up visits with his or her health care provider. PREVENTION  Ways to prevent confusion:  Avoid alcohol.  Eat a balanced diet.  Get enough sleep.  Take medicine only as directed by your health care provider.  Do not become isolated. Spend time with other people and make plans for your days.  Keep careful watch on your blood sugar levels if you are diabetic. SEEK IMMEDIATE MEDICAL CARE IF:   You develop severe headaches, repeated vomiting, seizures, blackouts, or   slurred speech.  There is increasing confusion, weakness, numbness, restlessness, or personality changes.  You develop a loss of balance, have marked dizziness, feel uncoordinated, or fall.  You have delusions, hallucinations, or develop severe anxiety.  Your family members think you need to be rechecked.   This information is not intended to replace advice given  to you by your health care provider. Make sure you discuss any questions you have with your health care provider.   Document Released: 08/10/2004 Document Revised: 07/24/2014 Document Reviewed: 08/08/2013 Elsevier Interactive Patient Education 2016 Elsevier Inc.  

## 2015-09-24 NOTE — Discharge Summary (Signed)
Physician Discharge Summary  Maria Parrish [id number] DOB: 08/02/1945 DOA: 09/22/2015  PCP: Shade Flood, MD  Admit date: 09/22/2015 Discharge date: 09/24/2015   Recommendations for Outpatient Follow-Up:   1. F/U with PCP in 1 week recommended. 2. Home physical therapy, RN and nursing aide set up.   Discharge Diagnosis:   Principal Problem:    Acute encephalopathy with probable underlying dementa and cognitive decline Active Problems:    Essential hypertension    Hyperlipidemia    Malnutrition of moderate degree (HCC)    Non-insulin treated type 2 diabetes mellitus (HCC)    Altered mental status    Left hemiparesis ([place])   Discharge disposition:  Home with home health services.  Discharge Condition: Improved.  Diet recommendation: Low sodium, heart healthy.    History of Present Illness:   Maria Parrish is an 70 y.o. female with a PMH of dementia, hypertension, hyperparathyroidism, type 2 diabetes and hyperlipidemia who was admitted 09/22/15 with a change in mental status, left hemiparesis and a left sided facial droop. The patient was recently discharged from the hospital after a prolonged stay from 09/14/2015 through 09/19/2015 for acute encephalopathy. She was ultimately just discharged to SNF, [facility]. It was thought during that time that UTI may have contributed to her confusion although there was concern for possible NPH. Neurology was consulted, but did not feel the patient's MRI was impressive for NPH. Extensive workup was obtained and found the patient to have a low B12 level. She was started on intramuscular B12. Unfortunately, it is unclear when her last dose was. The patient was treated with 3 days of antibiotics. Additional workup including ammonia, RPR, B12, homocysteine, ANA were unremarkable. Sedimentation rate was 70. In the ED, CT of the brain was negative.   Hospital Course by Problem:   Principal problem:  Acute left hemiparesis in the setting  of acute encephalopathy and underlying cognitive decline - Symptoms resolved at the time of presentation to the emergency department. - 09/15/2015 MRI brain negative for acute infarct, +progressive mild ventricular dilatation when compared to MRI in 2010.  - Repeat MRI brain showed no significant change in ventricular enlargement, no evidence of acute stroke. - Continue aspirin.  - Status post PT/OT/ST evaluations.Home PT set up. - Carotid ultrasound negative for hemodynamically significant stenosis.  - Echocardiogram shows EF 55-60 percent with grade 1 diastolic dysfunction. - EEG showed findings consistent with encephalopathy, no epileptiform activity.  Acute encephalopathy - UA negative for nitrites and leukocytes, follow-up urine culture. Chest x-ray clear. - CT and MRI of the brain negative for acute findings, but there is again question of hydrocephalus. - 09/14/2015 B12 158, now WNL at 443. - Continue Norvasc at discharge control blood pressure. - Suspect underlying dementia contributory.  Active problems: Hypokalemia - Repleted.  Diabetes mellitus type 2  - Resume Metformin and glipizide.  Hypertension  - We'll discharge home on lisinopril and Norvasc.  Hyperparathyroidism - Corrected calcium approx 9.9.  Weight loss/moderate malnutrition - 08/12/2015 EGD and colonoscopy unremarkable except for diverticulosis and small hiatus hernia. - Evaluated by dietitian. - Hepatitis B surface antigen, hepatitis C antibody, HIV all negative.    Medical Consultants:    Neurology   Discharge Exam:   Filed Vitals:   09/24/15 0641 09/24/15 1352  BP: 142/77 146/72  Pulse: 78 74  Temp: 98.2 F (36.8 C) 98.4 F (36.9 C)  Resp: 18 18   Filed Vitals:   09/23/15 1211 09/23/15 2120 09/24/15 0641 09/24/15 1352  BP: 164/74 158/72 142/77 146/72  Pulse: 87 88 78 74  Temp: 98.6 F (37 C) 98.5 F (36.9 C) 98.2 F (36.8 C) 98.4 F (36.9 C)  TempSrc: Oral Oral Oral  Oral  Resp: 20 18 18 18   Height:      Weight:      SpO2: 98% 100% 100% 98%    Gen:  NAD Cardiovascular:  RRR, No M/R/G Respiratory: Lungs CTAB Gastrointestinal: Abdomen soft, NT/ND with normal active bowel sounds. Extremities: No C/E/C   The results of significant diagnostics from this hospitalization (including imaging, microbiology, ancillary and laboratory) are listed below for reference.     Procedures and Diagnostic Studies:   US Carotid Bilateral  10-03-2015  CLINICAL DATA:  70 year old female with a history of left hemi paresis and left facial droop. Cardiovascular risk factors include hypertension, hyperlipidemia, diabetes EXAM: BILATERAL CAROTID DUPLEX ULTRASOUND TECHNIQUE: Pearline Cables scale imaging, color Doppler and duplex ultrasound were performed of bilateral carotid and vertebral arteries in the neck. COMPARISON:  MR 09/22/2015 FINDINGS: Criteria: Quantification of carotid stenosis is based on velocity parameters that correlate the residual internal carotid diameter with NASCET-based stenosis levels, using the diameter of the distal internal carotid lumen as the denominator for stenosis measurement. The following velocity measurements were obtained: RIGHT ICA:  Systolic 91 cm/sec, Diastolic 27 cm/sec CCA:  82 cm/sec SYSTOLIC ICA/CCA RATIO:  1.1 ECA:  53 cm/sec LEFT ICA:  Systolic [id number] cm/sec, Diastolic 16 cm/sec CCA:  89 cm/sec SYSTOLIC ICA/CCA RATIO:  1.2 ECA:  74 cm/sec Right Brachial SBP: Not acquired Left Brachial SBP: Not acquired RIGHT CAROTID ARTERY: Atherosclerotic changes of the right common carotid artery with interval thickening. No significant calcifications. Intermediate waveform maintained. Heterogeneous and partially calcified plaque at the right carotid bifurcation. Low resistant waveform of the right ICA. No significant tortuosity. RIGHT VERTEBRAL ARTERY: Antegrade flow with low resistance waveform. LEFT CAROTID ARTERY: Atherosclerotic changes of the left common carotid  artery with intimal thickening. No significant calcifications. Heterogeneous and partially calcified plaque at the left carotid bifurcation. Low resistance waveform of the left ICA. LEFT VERTEBRAL ARTERY:  Antegrade flow with low resistance waveform. IMPRESSION: Color duplex indicates minimal heterogeneous and calcified plaque, with no hemodynamically significant stenosis by duplex criteria in the extracranial cerebrovascular circulation. Signed, Dulcy Fanny. Earleen Newport, DO [facility] Electronically Signed   By: Corrie Mckusick D.O.   On: 10/03/2015 11:50     Labs:   Basic Metabolic Panel:  Recent Labs Lab 09/19/15 0621 09/22/15 1317 10/03/15 0537  NA 140 138 141  K 3.1* 3.7 3.2*  CL 107 103 107  CO2 26 26 27   GLUCOSE 153* 183* 160*  BUN 9 14 14   CREATININE 0.41* 0.85 0.60  CALCIUM 9.1 9.8 9.3  MG  --  1.5*  --    GFR Estimated Creatinine Clearance: 56.5 mL/min (by C-G formula based on Cr of 0.6). Liver Function Tests:  Recent Labs Lab 09/19/15 0621 09/22/15 1317 10/03/2015 0537  AST 25 37 21  ALT 29 51 37  ALKPHOS 43 50 49  BILITOT 0.5 0.6 0.4  PROT 6.3* 7.9 6.5  ALBUMIN 3.0* 3.7 3.1*   No results for input(s): LIPASE, AMYLASE in the last 168 hours.  Recent Labs Lab 09/18/15 1117  AMMONIA 26   Coagulation profile  Recent Labs Lab 09/22/15 1317  INR 0.99    CBC:  Recent Labs Lab 09/18/15 0640 09/22/15 1317 10-03-2015 0537  WBC 5.8 6.4 5.5  NEUTROABS  --  3.5  --   HGB 10.7* 12.7 10.6*  HCT 29.6* 36.2 30.0*  MCV 85.1 85.4 85.0  PLT 261 381 362   Cardiac Enzymes:  Recent Labs Lab 09/22/15 1317  CKTOTAL 52   BNP: Invalid input(s): POCBNP CBG:  Recent Labs Lab 09/23/15 1134 09/23/15 1625 09/23/15 2107 09/24/15 0706 09/24/15 1104  GLUCAP 126* 140* 204* 144* 143*   Lipid Profile  Recent Labs  09/23/15 0537  CHOL 173  HDL 36*  LDLCALC 118*  TRIG 96  CHOLHDL 4.8   Anemia work  up  Recent Labs  09/22/15 Mobile 443   Microbiology Recent Results (from the past 240 hour(s))  Urine culture     Status: None   Collection Time: 09/14/15  8:00 PM  Result Value Ref Range Status   Specimen Description URINE, CATHETERIZED  Final   Special Requests NONE  Final   Culture   Final    NO GROWTH 1 DAY Performed at [facility]    Report Status 09/16/2015 FINAL  Final  Urine culture     Status: None (Preliminary result)   Collection Time: 09/22/15  9:57 PM  Result Value Ref Range Status   Specimen Description URINE, CLEAN CATCH  Final   Special Requests NONE  Final   Culture   Final    TOO YOUNG TO READ Performed at [facility]    Report Status PENDING  Incomplete     Discharge Instructions:       Discharge Instructions    Call MD for:  extreme fatigue    Complete by:  As directed      Diet - low sodium heart healthy    Complete by:  As directed      Face-to-face encounter (required for Medicare/Medicaid patients)    Complete by:  As directed   I Mikias Lanz certify that this patient is under my care and that I, or a nurse practitioner or physician's assistant working with me, had a face-to-face encounter that meets the physician face-to-face encounter requirements with this patient on 09/24/2015. The encounter with the patient was in whole, or in part for the following medical condition(s) which is the primary reason for home health care (List medical condition): Encephalopathy with a change in medical condition.  The encounter with the patient was in whole, or in part, for the following medical condition, which is the primary reason for home health care:  AMS, encephalopathy  I certify that, based on my findings, the following services are medically necessary home health services:   Nursing Physical therapy    Reason for Medically Necessary Home Health Services:   Skilled Nursing- Skilled Assessment/Observation Skilled Nursing-  Change/Decline in Patient Status Therapy- Personnel officer, Public librarian Therapy- Home Adaptation to Facilitate Safety Therapy- Therapeutic Exercises to Increase Strength and Endurance    My clinical findings support the need for the above services:  Unable to leave home safely without assistance and/or assistive device  Further, I certify that my clinical findings support that this patient is homebound due to:  Unable to leave home safely without assistance     Home Health    Complete by:  As directed   To provide the following care/treatments:   RN PT [place]       Increase activity slowly    Complete by:  As directed             Medication List    TAKE these  medications        amLODipine 10 MG tablet  Commonly known as:  NORVASC  Take 1 tablet (10 mg total) by mouth daily.     aspirin 325 MG tablet  Take 1 tablet (325 mg total) by mouth daily.     folic acid 1 MG tablet  Commonly known as:  FOLVITE  Take 1 tablet (1 mg total) by mouth daily.     glipiZIDE 10 MG tablet  Commonly known as:  GLUCOTROL  Take 1 tablet (10 mg total) by mouth daily before breakfast.     lisinopril 20 MG tablet  Commonly known as:  PRINIVIL,ZESTRIL  Take 1 tablet (20 mg total) by mouth daily.     metFORMIN 500 MG tablet  Commonly known as:  GLUCOPHAGE  Take 1 tablet (500 mg total) by mouth daily.     polyethylene glycol packet  Commonly known as:  MIRALAX / GLYCOLAX  Take 17 g by mouth daily as needed for mild constipation.     VITAMIN B-12 PO  Take 1 tablet by mouth daily.     Vitamin D (Ergocalciferol) 50000 units Caps capsule  Commonly known as:  DRISDOL  Take 1 capsule (50,000 Units total) by mouth every 7 (seven) days.       Follow-up Information    Follow up with PETERSON, Linden Dolin, MD. Schedule an appointment as soon as possible for a visit in 1 week.   Specialty:  Family Medicine   Why:  Hospital follow up.   Contact information:   [address] [place] [address] [phone]        Time coordinating discharge: 30 minutes.  Signed:  Eustace Hur  Pager [phone] [facility] 09/24/2015, 4:32 PM

## 2015-09-24 NOTE — Progress Notes (Signed)
Progress Note   Maria Parrish [id number] DOB: Nov 17, 1945 DOA: 09/22/2015 PCP: Shade Flood, MD   Brief Narrative:   Maria Parrish is an 70 y.o. female with a PMH of dementia, hypertension, hyperparathyroidism, type 2 diabetes and hyperlipidemia who was admitted 09/22/15 with a change in mental status, left hemiparesis and a left sided facial droop. The patient was recently discharged from the hospital after a prolonged stay from 09/14/2015 through 09/19/2015 for acute encephalopathy. She was ultimately just discharged to SNF, [facility]. It was thought during that time that UTI may have contributed to her confusion although there was concern for possible NPH. Neurology was consulted, but did not feel the patient's MRI was impressive for NPH. Extensive workup was obtained and found the patient to have a low B12 level. She was started on intramuscular B12. Unfortunately, it is unclear when her last dose was. The patient was treated with 3 days of antibiotics. Additional workup including ammonia, RPR, B12, homocysteine, ANA were unremarkable. Sedimentation rate was 70. In the ED, CT of the brain was negative.  Assessment/Plan:   Principal problem:  Acute left hemiparesis  - Symptoms resolved at the time of presentation to the emergency department. - 09/15/2015 MRI brain negative for acute infarct, +progressive mild ventricular dilatation when compared to MRI in 2010.   - Repeat MRI brain showed no significant change in ventricular enlargement, no evidence of acute stroke. - Continue aspirin.  - Status post PT/OT/ST evaluations. - Carotid ultrasound negative for hemodynamically significant stenosis.  - Echocardiogram shows EF 55-60 percent with grade 1 diastolic dysfunction. - EEG pending. - Neurologist consulting.   Acute encephalopathy - UA negative for nitrites and leukocytes, follow-up urine culture. Chest x-ray clear. - CT and MRI of the brain negative for acute findings, but  there is again question of hydrocephalus. - 09/14/2015 B12 158, now WNL at 443. - Continue to hydrate. Control BP, ? Hypertensive encephalopathy. Blood pressure improved today. - Suspect underlying dementia contributory.  Active problems: Hypokalemia - Repleted.  Diabetes mellitus type 2  - Continue NovoLog insulin sensitive sliding scale. CBGs 126-204. Add meal coverage. - Metformin and glipizide on hold.  Hypertension  - Hydralazine prn for SBP >200. Improved control on resuming lisinopril and adding Norvasc.  Hyperparathyroidism - Corrected calcium approx 9.9.  Weight loss/moderate malnutrition - 08/12/2015 EGD and colonoscopy unremarkable except for diverticulosis and small hiatus hernia. - Nutrition consult. - Hepatitis B surface antigen, hepatitis C antibody, HIV all negative.    DVT Prophylaxis - Lovenox ordered.   Family Communication/Anticipated D/C date and plan/Code Status   Family Communication: No family at the bedside. Disposition Plan/date: From SNF, but son now wants to take her home, symptoms fully evaluated. D/C home if EEG negative.  Awaiting results and final neuro recommendations. Code Status: Full code.   IV Access:    Peripheral IV   Procedures and diagnostic studies:   Dg Chest 2 View  09/22/2015  CLINICAL DATA:  Left facial droop, confusion, history of dementia EXAM: CHEST  2 VIEW COMPARISON:  Chest x-ray of 09/14/2015 FINDINGS: No active infiltrate or effusion is seen. Mediastinal and hilar contours are unremarkable. The heart is mildly enlarged and stable. No acute bony abnormality is seen. IMPRESSION: No active lung disease.  Stable mild cardiomegaly. Electronically Signed   By: Ivar Drape M.D.   On: 09/22/2015 16:50   Ct Head Wo Contrast  09/22/2015  ADDENDUM REPORT: 09/22/2015 13:50 ADDENDUM: Critical Value/emergent results were called by  telephone at the time of interpretation on 09/22/2015 at 1:35 pm to Dr. Merrily Pew , who verbally  acknowledged these results. Electronically Signed   By: Skipper Cliche M.D.   On: 09/22/2015 13:50  09/22/2015  CLINICAL DATA:  Weakness, left-sided deficits, code stroke EXAM: CT HEAD WITHOUT CONTRAST TECHNIQUE: Contiguous axial images were obtained from the base of the skull through the vertex without intravenous contrast. COMPARISON:  09/15/2015, 09/14/2015 FINDINGS: Moderate diffuse cortical atrophy and low attenuation in the deep white matter. No evidence of focal vascular territory infarct or mass. No parenchymal hemorrhage or extra-axial fluid. Ventricles prominent in proportion to atrophy. Calvarium is intact. Visualized portions of paranasal sinuses clear. IMPRESSION: Chronic involutional change with no acute findings Electronically Signed: By: Skipper Cliche M.D. On: 09/22/2015 13:32   Mr Jodene Nam Head Wo Contrast  09/22/2015  CLINICAL DATA:  Acute encephalopathy. EXAM: MRA HEAD WITHOUT CONTRAST TECHNIQUE: Angiographic images of the Circle of Willis were obtained using MRA technique without intravenous contrast. COMPARISON:  MRI brain from the same day.  MRI brain 09/15/2015. FINDINGS: The study is moderately degraded by patient motion. Signal on the branch vessels is severely degraded. The internal carotid arteries are within normal limits to the ICA termini bilaterally. There is some signal loss within the proximal left A1 segment compared to the right. The anterior communicating artery is patent. The MCA bifurcations are intact. Proximal signal loss in the MCA branch vessels is likely artifactual. There is some attenuation of distal MCA branch vessels, worse on the left. The vertebral arteries are codominant. Signal loss in the proximal basilar artery appears artifactual. The distal basilar signal is normal. Both posterior cerebral arteries originate from the basilar tip. There is attenuation of PCA branch vessels bilaterally. IMPRESSION: 1. Moderate diffuse small vessel disease. 2. The study is  moderately degraded by patient motion. 3. Asymmetric narrowing of the left A1 segment compared to the right. Electronically Signed   By: San Morelle M.D.   On: 09/22/2015 20:35   Mr Brain Wo Contrast  09/22/2015  CLINICAL DATA:  Acute encephalopathy. EXAM: MRI HEAD WITHOUT CONTRAST TECHNIQUE: Multiplanar, multiecho pulse sequences of the brain and surrounding structures were obtained without intravenous contrast. COMPARISON:  CT head without contrast from the same day. MRI brain 09/15/2015. FINDINGS: The study is moderately degraded by patient motion. Moderate generalized atrophy is again noted. The ventricles are enlarged. Periventricular T2 changes are noted bilaterally. There is no significant interval change in size of the ventricles since the prior exam. No acute hemorrhage or mass lesion is present. There is no acute infarct. Flow is present in the major intracranial arteries. The globes and orbits are intact. The brainstem and cerebellum are normal. The internal auditory canals are within normal limits. Left greater than right ethmoid sinus disease is present. IMPRESSION: 1. No significant change in ventricular enlargement. Hydrocephalus is still question. 2. No other acute intracranial abnormality or significant interval change. 3. Left ethmoid sinus disease. Electronically Signed   By: San Morelle M.D.   On: 09/22/2015 20:32     Medical Consultants:    Neurology  Anti-Infectives:   Anti-infectives    None      Subjective:   Maria Parrish is about the same, pleasantly confused, alert, interactive.  A niece is at the bedside.  Remains in safety mittens.    Objective:    Filed Vitals:   09/23/15 0811 09/23/15 1211 09/23/15 2120 09/24/15 0641  BP: 180/83 164/74 158/72 142/77  Pulse: 72  87 88 78  Temp: 98.5 F (36.9 C) 98.6 F (37 C) 98.5 F (36.9 C) 98.2 F (36.8 C)  TempSrc: Oral Oral Oral Oral  Resp: 18 20 18 18   Height:      Weight:      SpO2: 100% 98%  100% 100%    Intake/Output Summary (Last 24 hours) at 09/24/15 0800 Last data filed at 09/24/15 0700  Gross per 24 hour  Intake   3365 ml  Output      0 ml  Net   3365 ml   Filed Weights   09/22/15 1611  Weight: 53.9 kg (118 lb 13.3 oz)    Exam: Gen:  NAD, has safety mittens on Cardiovascular:  RRR, No M/R/G Respiratory:  Lungs CTAB Gastrointestinal:  Abdomen soft, NT/ND, + BS Extremities:  No C/E/C Neuro: Alert and oriented to self, hospital.  Answers questions slowly, no facial droop appreciated   Data Reviewed:    Labs: Basic Metabolic Panel:  Recent Labs Lab 09/19/15 0621 09/22/15 1317 09/23/15 0537  NA 140 138 141  K 3.1* 3.7 3.2*  CL 107 103 107  CO2 26 26 27   GLUCOSE 153* 183* 160*  BUN 9 14 14   CREATININE 0.41* 0.85 0.60  CALCIUM 9.1 9.8 9.3  MG  --  1.5*  --    GFR Estimated Creatinine Clearance: 56.5 mL/min (by C-G formula based on Cr of 0.6). Liver Function Tests:  Recent Labs Lab 09/19/15 0621 09/22/15 1317 09/23/15 0537  AST 25 37 21  ALT 29 51 37  ALKPHOS 43 50 49  BILITOT 0.5 0.6 0.4  PROT 6.3* 7.9 6.5  ALBUMIN 3.0* 3.7 3.1*    Recent Labs Lab 09/18/15 1117  AMMONIA 26   Coagulation profile  Recent Labs Lab 09/22/15 1317  INR 0.99    CBC:  Recent Labs Lab 09/18/15 0640 09/22/15 1317 09/23/15 0537  WBC 5.8 6.4 5.5  NEUTROABS  --  3.5  --   HGB 10.7* 12.7 10.6*  HCT 29.6* 36.2 30.0*  MCV 85.1 85.4 85.0  PLT 261 381 362   Cardiac Enzymes:  Recent Labs Lab 09/22/15 1317  CKTOTAL 52   BNP (last 3 results) No results for input(s): PROBNP in the last 8760 hours. CBG:  Recent Labs Lab 09/23/15 0717 09/23/15 1134 09/23/15 1625 09/23/15 2107 09/24/15 0706  GLUCAP 159* 126* 140* 204* 144*   Lipid Profile:  Recent Labs  09/23/15 0537  CHOL 173  HDL 36*  LDLCALC 118*  TRIG 96  CHOLHDL 4.8   Anemia work up:  Recent Labs  09/22/15 1317  VITAMINB12 443   Sepsis Labs:  Recent Labs Lab  09/18/15 0640 09/22/15 1317 09/23/15 0537  WBC 5.8 6.4 5.5   Microbiology Recent Results (from the past 240 hour(s))  Urine culture     Status: None   Collection Time: 09/14/15  8:00 PM  Result Value Ref Range Status   Specimen Description URINE, CATHETERIZED  Final   Special Requests NONE  Final   Culture   Final    NO GROWTH 1 DAY Performed at [facility]    Report Status 09/16/2015 FINAL  Final     Medications:   . amLODipine  10 mg Oral Daily  . aspirin  300 mg Rectal Daily   Or  . aspirin  325 mg Oral Daily  . enoxaparin (LOVENOX) injection  40 mg Subcutaneous Q24H  . feeding supplement (ENSURE ENLIVE)  237 mL Oral BID BM  .  folic acid  1 mg Oral Daily  . insulin aspart  0-5 Units Subcutaneous QHS  . insulin aspart  0-9 Units Subcutaneous TID WC  . lisinopril  20 mg Oral Daily  . vitamin B-12  500 mcg Oral Daily  . [START ON 09/26/2015] Vitamin D (Ergocalciferol)  50,000 Units Oral Q7 days   Continuous Infusions: . 0.9 % NaCl with KCl 20 mEq / L 75 mL/hr at 09/23/15 1309    Time spent: 25 minutes.     [facility] Pager [phone]. If unable to reach me by pager, please call my cell phone at [phone].  *Please refer to [email or web address], password [email or web address] to get updated schedule on who will round on this patient, as hospitalists switch teams weekly. If 7PM-7AM, please contact night-coverage at [email or web address], password [email or web address] for any overnight needs.  09/24/2015, 8:00 AM

## 2015-09-24 NOTE — Evaluation (Signed)
Speech Language Pathology Evaluation Patient Details Name: Maria Parrish MRN: [id number] DOB: Oct 08, 1945 Today's Date: 09/24/2015 Time: [id number] SLP Time Calculation (min) (ACUTE ONLY): 18 min  Problem List:  Patient Active Problem List   Diagnosis Date Noted  . Altered mental status 09/22/2015  . Left hemiparesis ([place]) 09/22/2015  . Acute encephalopathy 09/22/2015  . Palliative care encounter   . UTI (urinary tract infection) 09/14/2015  . Dementia 09/14/2015  . General weakness 09/14/2015  . Subacute confusional state 09/14/2015  . Non-insulin treated type 2 diabetes mellitus ([place]) 09/14/2015  . Confusion 09/14/2015  . Fall at home   . Elevated parathyroid hormone 09/09/2015  . Mucosal abnormality of stomach   . Diverticulosis of colon without hemorrhage   . Hypercalcemia 08/10/2015  . Goiter 08/10/2015  . Abnormal weight loss 07/28/2015  . Neuropathy ([place]) 03/26/2015  . Hypokalemia 03/26/2015  . Essential hypertension 03/26/2015  . Hyperlipidemia 03/26/2015  . Malnutrition of moderate degree ([place]) 03/26/2015   Past Medical History:  Past Medical History  Diagnosis Date  . Diabetes mellitus without complication ([place])   . Hypertension   . Hyperlipidemia   . Hypercalcemia   . Hyperparathyroidism ([place])   . Confusion   . Assistance needed for mobility     walks with walker  . Peripheral neuropathy ([place])   . UTI (lower urinary tract infection)   . Moderate protein-calorie malnutrition ([place])   . Hypertension   . Dementia   . Generalized weakness    Past Surgical History:  Past Surgical History  Procedure Laterality Date  . None    . Colonoscopy N/A 08/12/2015    Procedure: COLONOSCOPY;  Surgeon: Daneil Dolin, MD;  Location: [facility];  Service: Endoscopy;  Laterality: N/A;  1245pm  . Esophagogastroduodenoscopy N/A 08/12/2015    Procedure: ESOPHAGOGASTRODUODENOSCOPY (EGD);  Surgeon: Daneil Dolin, MD;  Location: [facility];  Service: Endoscopy;  Laterality:  N/A;   HPI:  Maria Parrish is an 70 y.o. female with a PMH of dementia, hypertension, hyperparathyroidism, type 2 diabetes and hyperlipidemia who was admitted 09/22/15 with a change in mental status, left hemiparesis and a left sided facial droop. The patient was recently discharged from the hospital after a prolonged stay from 09/14/2015 through 09/19/2015 for acute encephalopathy. She was ultimately just discharged to SNF, [facility]. It was thought during that time that UTI may have contributed to her confusion although there was concern for possible NPH. Neurology was consulted, but did not feel the patient's MRI was impressive for NPH. CT of the brain was negative; MR of brain without acute intracranial abnormality and question for hydrocephalus.    Assessment / Plan / Recommendation Clinical Impression  Pt with significant cognitive decline, note hx of dementia and hx of cognitive impairments however no family or caregiver present to determine severity of premobid deficits. This date, pts deficits consistent with a 6 to 6.5 on the (GDS) global deterioration scale indicative of requiring 24 hour care and assistance for all ADLs.  Note poor independent initiation for basic and functional ADLs including self feeding (further increasing risk for malnutrition and dehydration). Educated staff for full feeding assistance with all meals. Pt disoriented to place, situation, and time. Pt with poor safety awareness, poor problem solving, and poor recall. Speech and language skills appear intact. Note left sided body weakness, however oral motor exam appeared unremarkable. Consulted with nursing who states pt lived with son who works and is not available 24 hours a day to provide  level of care needed. If family is unable to provide 24 hour care recommend SNF placement as pts severity of cognitive deficits prohibit independent and semi independent functioning. SLP to follow up for education with family regarding  recommendations    SLP Assessment  Patient needs continued Speech Lanaguage Pathology Services    Follow Up Recommendations  Skilled Nursing facility;24 hour supervision/assistance    Frequency and Duration min 1 x/week  1 week      SLP Evaluation Prior Functioning  Cognitive/Linguistic Baseline: Baseline deficits Type of Home: Mobile home  Lives With: Son Available Help at Discharge: Family;Available PRN/intermittently;Friend(s) Education:  (unable to obtain from pt; no caregiver present )   Cognition  Overall Cognitive Status: Difficult to assess (pt with hx of cognitive impairments though no caregiver/fam) Arousal/Alertness: Awake/alert Orientation Level: Oriented to person;Disoriented to time;Disoriented to situation;Disoriented to place Attention: Sustained;Focused Focused Attention: Impaired Focused Attention Impairment: Verbal basic;Functional basic Sustained Attention: Impaired Sustained Attention Impairment: Verbal basic;Functional basic Memory: Impaired Memory Impairment: Decreased short term memory;Retrieval deficit;Decreased recall of new information Decreased Short Term Memory: Verbal basic;Functional basic Awareness: Impaired Problem Solving: Impaired Problem Solving Impairment: Verbal basic;Functional basic Executive Function: Self Monitoring;Reasoning;Sequencing Reasoning: Impaired Reasoning Impairment: Verbal basic;Functional basic Sequencing: Impaired Sequencing Impairment: Verbal basic;Functional basic Self Monitoring: Impaired Self Monitoring Impairment: Verbal basic;Functional basic Safety/Judgment: Impaired    Comprehension  Auditory Comprehension Overall Auditory Comprehension: Appears within functional limits for tasks assessed    Expression Expression Primary Mode of Expression: Verbal Verbal Expression Overall Verbal Expression: Appears within functional limits for tasks assessed Written Expression Dominant Hand: Right   Oral / Motor   Oral Motor/Sensory Function Overall Oral Motor/Sensory Function: Within functional limits Motor Speech Overall Motor Speech: Appears within functional limits for tasks assessed   GO          Functional Assessment Tool Used: skilled clinical judgement Functional Limitations: Memory Memory Current Status [id number]): At least 80 percent but less than 100 percent impaired, limited or restricted Memory Goal Status [id number]): At least 80 percent but less than 100 percent impaired, limited or restricted         Arvil Chaco MA, [place] Pathologist    Levi Aland 09/24/2015, 2:13 PM

## 2015-09-24 NOTE — Progress Notes (Addendum)
Initial Nutrition Assessment  DOCUMENTATION CODES:   Non-severe (moderate) malnutrition in context of acute illness/injury  INTERVENTION:  -Ensure Enlive po BID, each supplement provides 350 kcal and 20 grams of protein   -Assist with feeding all meals  -offer snack and hydration from nursing stock every HS   NUTRITION DIAGNOSIS:   Malnutrition related to lethargy/confusion  And dementia at baseline as evidenced by  (4.1% in 14 days), mild to moderate loss of muscle and fat mass.  GOAL:   Patient will meet greater than or equal to 90% of their needs  MONITOR:   PO intake, Supplement acceptance, Labs, Weight trends  REASON FOR ASSESSMENT:   Consult Assessment of nutrition requirement/status  ASSESSMENT:   70 year old female with a history of dementia, hypertension, hyperparathyroidism, diabetes mellitus type 2, hyperlipidemia presented with left hemiparesis and left facial droop according to the patient's family. The patient is pleasantly confused and his ibuprofen any significant history. Pt had EGD on 08/12/15 with no acute findings.  Pt still has mitts on from yesterday. Her breakfast is still here and pancakes and oatmeal are cold. Called to dietary and ordered fresh food for her since her's can't be re-heated. She likes Ensure Enlive and milk and drank 100% this morning. She is being feed by staff and meal intake from yesterday shows average intake 50-75%. She was diagnosed with moderate malnutrition in chronic illness last fall in September when she was hospitalized due to increased confusion, elevated WBC and glucose levels. She was complaining of weight loss at that time.  Concern for pt going home if there will be no supervision/meal assistance for her during the day. She is being fed at this time and she has a hx of dementia. Baseline unknown if she normally can eat without assistance. She is high risk for continued and worsening malnutrition due to her dementia and  limited self -feeding ability.  Nutrition focused exam findings: multiple areas of mild-moderate muscle and fat mass loss. No edema. Vitamin B-12 found to be low and she is receiving intramuscular injections.  Diet Order:  Diet heart healthy/carb modified Room service appropriate?: Yes; Fluid consistency:: Thin  Skin:   intact  Last BM:   3/7  Height:   Ht Readings from Last 1 Encounters:  09/22/15 5\' 5"  (1.651 m)    Weight:   Wt Readings from Last 1 Encounters:  09/22/15 118 lb 13.3 oz (53.9 kg)  2/23-Pt weight 124#   Ideal Body Weight:  57 kg  BMI:  Body mass index is 19.77 kg/(m^2).  Estimated Nutritional Needs:   Kcal:  [phone] (with goal to prevent further weight loss)  Protein:  65-70 gr  Fluid:  1.8 liters daily  EDUCATION NEEDS: none at this time pt is not able to fully participate due to altered mental status.    Colman Cater MS,RD,CSG,LDN Office: [phone] Pager: [phone]

## 2015-09-25 LAB — URINE CULTURE

## 2015-09-25 NOTE — Progress Notes (Signed)
Late entry: Pt's discharge condition and instructions reviewed w/son [name] over the phone and w/another son who is driving Maria Parrish home.  Both state that they have caregivers in the home, available 24 hrs, and [name] will not be left alone.  Pt discharged home w/son in stable condition.

## 2015-12-02 ENCOUNTER — Other Ambulatory Visit: Payer: Self-pay | Admitting: "Endocrinology

## 2015-12-02 LAB — BASIC METABOLIC PANEL
BUN: 11 mg/dL (ref 7–25)
CHLORIDE: 103 mmol/L (ref 98–110)
CO2: 26 mmol/L (ref 20–31)
CREATININE: 0.57 mg/dL (ref 0.50–0.99)
Calcium: 9.4 mg/dL (ref 8.6–10.4)
Glucose, Bld: 98 mg/dL (ref 65–99)
POTASSIUM: 4.1 mmol/L (ref 3.5–5.3)
SODIUM: 139 mmol/L (ref 135–146)

## 2015-12-02 LAB — HEMOGLOBIN A1C
Hgb A1c MFr Bld: 8.3 % — ABNORMAL HIGH (ref <5.7)
MEAN PLASMA GLUCOSE: 192 mg/dL

## 2015-12-03 LAB — PTH, INTACT AND CALCIUM
CALCIUM: 9.4 mg/dL (ref 8.4–10.5)
PTH: 62 pg/mL (ref 14–64)

## 2016-01-10 ENCOUNTER — Ambulatory Visit: Payer: Medicare HMO | Admitting: "Endocrinology

## 2016-01-11 ENCOUNTER — Ambulatory Visit: Payer: Medicare HMO | Admitting: "Endocrinology

## 2016-01-12 ENCOUNTER — Ambulatory Visit (INDEPENDENT_AMBULATORY_CARE_PROVIDER_SITE_OTHER): Payer: Medicare HMO | Admitting: "Endocrinology

## 2016-01-12 ENCOUNTER — Encounter: Payer: Self-pay | Admitting: "Endocrinology

## 2016-01-12 DIAGNOSIS — E119 Type 2 diabetes mellitus without complications: Secondary | ICD-10-CM

## 2016-01-12 DIAGNOSIS — I10 Essential (primary) hypertension: Secondary | ICD-10-CM

## 2016-01-12 DIAGNOSIS — E559 Vitamin D deficiency, unspecified: Secondary | ICD-10-CM | POA: Diagnosis not present

## 2016-01-12 DIAGNOSIS — E785 Hyperlipidemia, unspecified: Secondary | ICD-10-CM

## 2016-01-12 MED ORDER — VITAMIN D (ERGOCALCIFEROL) 1.25 MG (50000 UNIT) PO CAPS
50000.0000 [IU] | ORAL_CAPSULE | ORAL | Status: DC
Start: 2016-01-12 — End: 2017-02-26

## 2016-01-12 NOTE — Progress Notes (Signed)
Subjective:    Patient ID: Maria Parrish, female    DOB: Feb 08, 1946, PCP Shade Flood, MD   Past Medical History  Diagnosis Date  . Diabetes mellitus without complication ([place])   . Hypertension   . Hyperlipidemia   . Hypercalcemia   . Hyperparathyroidism ([place])   . Confusion   . Assistance needed for mobility     walks with walker  . Peripheral neuropathy ([place])   . UTI (lower urinary tract infection)   . Moderate protein-calorie malnutrition ([place])   . Hypertension   . Dementia   . Generalized weakness    Past Surgical History  Procedure Laterality Date  . None    . Colonoscopy N/A 08/12/2015    Procedure: COLONOSCOPY;  Surgeon: Daneil Dolin, MD;  [facility];  Service: Endoscopy;  Laterality: N/A;  1245pm  . Esophagogastroduodenoscopy N/A 08/12/2015    Procedure: ESOPHAGOGASTRODUODENOSCOPY (EGD);  Surgeon: Daneil Dolin, MD;  Location[facility];  Service: Endoscopy;  Laterality: N/A;   Social History   Social History  . Marital Status: Widowed    Spouse Name: N/A  . Number of Children: 2  . 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 Smoking status: Never Smoker   . Smokeless tobacco: None  . Alcohol Use: No  . Drug Use: No  . Sexual Activity: Not Currently   Other Topics Concern  . None   Social History Narrative   Outpatient Encounter Prescriptions as of 01/12/2016  Medication Sig  . amLODipine (NORVASC) 10 MG tablet Take 1 tablet (10 mg total) by mouth daily.  Marland Kitchen aspirin 325 MG tablet Take 1 tablet (325 mg total) by mouth daily.  . Cyanocobalamin (VITAMIN B-12 PO) Take 1 tablet by mouth daily.  . folic acid (FOLVITE) 1 MG tablet Take 1 tablet (1 mg total) by mouth daily.  Marland Kitchen glipiZIDE (GLUCOTROL) 10 MG tablet Take 1 tablet (10 mg total) by mouth daily before breakfast.  . lisinopril (PRINIVIL,ZESTRIL) 20 MG tablet Take 1 tablet (20 mg total) by mouth daily.  . metFORMIN  (GLUCOPHAGE) 500 MG tablet Take 1 tablet (500 mg total) by mouth daily.  . polyethylene glycol (MIRALAX / GLYCOLAX) packet Take 17 g by mouth daily as needed for mild constipation.  . Vitamin D, Ergocalciferol, (DRISDOL) 50000 units CAPS capsule Take 1 capsule (50,000 Units total) by mouth every 14 (fourteen) days.  . [DISCONTINUED] Vitamin D, Ergocalciferol, (DRISDOL) 50000 units CAPS capsule Take 1 capsule (50,000 Units total) by mouth every 7 (seven) days.   No facility-administered encounter medications on file as of 01/12/2016.   ALLERGIES: Allergies  Allergen Reactions  . Penicillins Other (See Comments)    Makes patient sore.  Has patient had a PCN reaction causing immediate rash, facial/tongue/throat swelling, SOB or lightheadedness with hypotension: No Has patient had a PCN reaction causing severe rash involving mucus membranes or skin necrosis: No Has patient had a PCN reaction that required hospitalization No Has patient had a PCN reaction occurring within the last 10 years: No If all of the above answers are "NO", then may proceed with Cephalosporin use.   . Statins Other (See Comments)    Makes wrists and fingers ache   VACCINATION STATUS:  There is no immunization history on file for this patient.  HPI 70 year old female patient with medical history as above. She is here to follow-up after being seen  for history of hyperparathyroidism with recent elevation of PTH associated with mild hypercalcemia.  - Her repeat labs show improved PTH, stable calcium. Patient has recent history of unexplained weight loss being worked up with imaging studies including planned colonoscopy.   - Her thyroid ultrasound shows mild goiter with no discrete nodules. - No history of diagnosed cancer. - Her A1c is stable at 8.3 % , generally improving from 11.6% . She remains on glipizide 10 mg by mouth once a day and metformin 500 mg by mouth twice a day.  - She has no new complaints.  Review  of Systems Constitutional:  Has steady weight now , +fatigue, no subjective hyperthermia/hypothermia Eyes: no blurry vision, no xerophthalmia ENT: no sore throat, no nodules palpated in throat, no dysphagia/odynophagia, no hoarseness Cardiovascular: no CP/SOB/palpitations/leg swelling Respiratory: no cough/SOB Gastrointestinal: no N/V/D/C Musculoskeletal: no muscle/joint aches Skin: no rashes Neurological: no tremors/numbness/tingling,  +dizziness Psychiatric: no depression/anxiety   Objective:    BP 134/77 mmHg  Pulse 73  Ht 5\' 5"  (1.651 m)  Wt 125 lb (56.7 kg)  BMI 20.80 kg/m2  Wt Readings from Last 3 Encounters:  01/12/16 125 lb (56.7 kg)  09/22/15 118 lb 13.3 oz (53.9 kg)  09/14/15 119 lb 0.8 oz (54 kg)    Physical Exam Constitutional: Light built, in NAD, she uses a cane to walk around. Eyes: PERRLA, EOMI, no exophthalmos ENT: moist mucous membranes. Neck: She has prominent sternocleidomastoid joints bilaterally, she has mild thyromegaly,  No cervical lymphadenopathy Cardiovascular: RRR, No MRG Respiratory: CTA B Gastrointestinal: abdomen soft, NT, ND, BS+ Musculoskeletal: no deformities, strength intact in all 4 Skin: moist, warm, no rashes Neurological: no tremor with outstretched hands, DTR normal in all 4  Recent Results (from the past 2160 hour(s))  Basic metabolic panel     Status: None   Collection Time: 12/02/15  9:46 AM  Result Value Ref Range   Sodium 139 135 - 146 mmol/L   Potassium 4.1 3.5 - 5.3 mmol/L   Chloride 103 98 - 110 mmol/L   CO2 26 20 - 31 mmol/L   Glucose, Bld 98 65 - 99 mg/dL   BUN 11 7 - 25 mg/dL   Creat 0.57 0.50 - 0.99 mg/dL   Calcium 9.4 8.6 - 10.4 mg/dL  Hemoglobin A1c     Status: Abnormal   Collection Time: 12/02/15  9:46 AM  Result Value Ref Range   Hgb A1c MFr Bld 8.3 (H) <5.7 %    Comment:   For someone without known diabetes, a hemoglobin A1c value of 6.5% or greater indicates that they may have diabetes and this should  be confirmed with a follow-up test.   For someone with known diabetes, a value <7% indicates that their diabetes is well controlled and a value greater than or equal to 7% indicates suboptimal control. A1c targets should be individualized based on duration of diabetes, age, comorbid conditions, and other considerations.   Currently, no consensus exists for use of hemoglobin A1c for diagnosis of diabetes for children.      Mean Plasma Glucose 192 mg/dL  PTH, intact and calcium     Status: None   Collection Time: 12/02/15  9:46 AM  Result Value Ref Range   PTH 62 14 - 64 pg/mL   Calcium 9.4 8.4 - 10.5 mg/dL    Comment:   Interpretive Guide:  Intact PTH               Calcium                              ----------               ------- Normal Parathyroid           Normal                   Normal Hypoparathyroidism           Low or Low Normal        Low Hyperparathyroidism      Primary                 Normal or High           High      Secondary               High                     Normal or Low      Tertiary                High                     High Non-Parathyroid   Hypercalcemia              Low or Low Normal        High     Diabetic Labs (most recent): Lab Results  Component Value Date   HGBA1C 8.3* 12/02/2015   HGBA1C 7.9* 08/10/2015   HGBA1C 11.6* 03/26/2015   From 04/29/2015 lab work showed calcium 10.6. PTH was not reported to review today.    Assessment & Plan:   1. Hypercalcemia: Resolved - Likely driving factor is vitamin D deficiency causing secondary hyperparathyroidism. Her labs did not confirm primary hyperparathyroidism for now. - Her repeat labs show PTH improved to 62 along with normal calcium of 9.4. Etiology unclear for now. Early mild primary hyperarathyroidism versus secondary hyperparathyroidism possibility. -She will not need any intervention for hypercalcemia at this time.  -Malignancy related hypercalcemia is  usually severe and typically above 12 mg/dL. Her recent weight loss is concerning and I agree with imaging studies to rule out occult malignancy in this particular patient. - Her neck/thyroid ultrasound was unremarkable.   2. Uncontrolled type 2 diabetes mellitus without complication, without long-term current use of insulin ([place]) - Her A1c is stable at 8.3%, compared to  of 11.6% from September. -She would not need insulin therapy for now. -I advised her to continue metformin 500 mg by mouth twice a day and lower Glucotrol to 10 mg by mouth daily.   3. Essential hypertension -Uncontrolled I advised her to continue lisinopril 30 mg by mouth every morning for now. we will reevaluate for additional therapy next visit.  4. Vitamin D deficiency: See #1 above. I will initiate vitamin D 50,000 units every 2 weeks for the next 16 weeks.  5. Goiter -Her thyroid ultrasound showed mild goiter with no discrete nodules. No intervention needed at this point.    - I advised patient to maintain close follow up with PETERSON, Linden Dolin, MD for primary care needs. Follow up plan: Return in about 6 months (around 07/13/2016) for follow up with pre-visit labs.  Glade Lloyd, MD Phone: [phone]  Fax:  [phone]   01/12/2016, 4:10 PM

## 2016-02-16 ENCOUNTER — Other Ambulatory Visit: Payer: Self-pay | Admitting: "Endocrinology

## 2016-02-16 ENCOUNTER — Other Ambulatory Visit: Payer: Self-pay | Admitting: *Deleted

## 2016-02-23 ENCOUNTER — Other Ambulatory Visit: Payer: Self-pay | Admitting: "Endocrinology

## 2016-07-11 ENCOUNTER — Other Ambulatory Visit: Payer: Self-pay | Admitting: "Endocrinology

## 2016-07-12 LAB — COMPLETE METABOLIC PANEL WITH GFR
ALBUMIN: 4.2 g/dL (ref 3.6–5.1)
ALT: 9 U/L (ref 6–29)
AST: 11 U/L (ref 10–35)
Alkaline Phosphatase: 72 U/L (ref 33–130)
BILIRUBIN TOTAL: 0.3 mg/dL (ref 0.2–1.2)
BUN: 22 mg/dL (ref 7–25)
CO2: 22 mmol/L (ref 20–31)
Calcium: 9.9 mg/dL (ref 8.6–10.4)
Chloride: 103 mmol/L (ref 98–110)
Creat: 0.94 mg/dL (ref 0.50–0.99)
GFR, EST NON AFRICAN AMERICAN: 62 mL/min (ref >=60)
GFR, Est African American: 72 mL/min (ref >=60)
GLUCOSE: 187 mg/dL — AB (ref 65–99)
Potassium: 4.1 mmol/L (ref 3.5–5.3)
SODIUM: 138 mmol/L (ref 135–146)
TOTAL PROTEIN: 7.8 g/dL (ref 6.1–8.1)

## 2016-07-12 LAB — HEMOGLOBIN A1C
Hgb A1c MFr Bld: 9.2 % — ABNORMAL HIGH (ref <5.7)
Mean Plasma Glucose: 217 mg/dL

## 2016-07-12 LAB — PTH, INTACT AND CALCIUM
CALCIUM: 9.9 mg/dL (ref 8.6–10.4)
PTH: 75 pg/mL — AB (ref 14–64)

## 2016-07-12 LAB — VITAMIN D 25 HYDROXY (VIT D DEFICIENCY, FRACTURES): Vit D, 25-Hydroxy: 39 ng/mL (ref 30–100)

## 2016-07-19 ENCOUNTER — Encounter: Payer: Self-pay | Admitting: "Endocrinology

## 2016-07-19 ENCOUNTER — Ambulatory Visit (INDEPENDENT_AMBULATORY_CARE_PROVIDER_SITE_OTHER): Payer: Medicare HMO | Admitting: "Endocrinology

## 2016-07-19 DIAGNOSIS — IMO0002 Reserved for concepts with insufficient information to code with codable children: Secondary | ICD-10-CM

## 2016-07-19 DIAGNOSIS — E1165 Type 2 diabetes mellitus with hyperglycemia: Secondary | ICD-10-CM

## 2016-07-19 DIAGNOSIS — E118 Type 2 diabetes mellitus with unspecified complications: Secondary | ICD-10-CM | POA: Diagnosis not present

## 2016-07-19 MED ORDER — METFORMIN HCL 500 MG PO TABS: 500.0000 mg | ORAL_TABLET | Freq: Two times a day (BID) | ORAL | 2 refills | Status: DC

## 2016-07-19 NOTE — Progress Notes (Signed)
Subjective:    Patient ID: Maria Parrish, female    DOB: 10-21-1945, PCP Vidal Schwalbe, MD   Past Medical History:  Diagnosis Date  . Assistance needed for mobility    walks with walker  . Confusion   . Dementia   . Diabetes mellitus without complication ([place])   . Generalized weakness   . Hypercalcemia   . Hyperlipidemia   . Hyperparathyroidism ([place])   . Hypertension   . Hypertension   . Moderate protein-calorie malnutrition ([place])   . Peripheral neuropathy ([place])   . UTI (lower urinary tract infection)    Past Surgical History:  Procedure Laterality Date  . COLONOSCOPY N/A 08/12/2015   Procedure: COLONOSCOPY;  Surgeon: Daneil Dolin, MD;  [facility];  Service: Endoscopy;  Laterality: N/A;  1245pm  . ESOPHAGOGASTRODUODENOSCOPY N/A 08/12/2015   Procedure: ESOPHAGOGASTRODUODENOSCOPY (EGD);  Surgeon: Daneil Dolin, MD;  [facility];  Service: Endoscopy;  Laterality: N/A;  . none     Social History   Social History  . Marital status: Widowed    Spouse name: N/A  . Number of children: 2  . Years of education: N/A   Occupational History  . fabricator for [employer]    Social History Main Topics  . Smoking status: Never Smoker  . Smokeless tobacco: Not on file  . Alcohol use No  . Drug use: No  . Sexual activity: Not Currently   Other Topics Concern  . Not on file   Social History Narrative  . No narrative on file   Outpatient Encounter Prescriptions as of 07/19/2016  Medication Sig  . lisinopril-hydrochlorothiazide (PRINZIDE,ZESTORETIC) 20-12.5 MG tablet Take 1 tablet by mouth daily.  Marland Kitchen amLODipine (NORVASC) 10 MG tablet Take 1 tablet (10 mg total) by mouth daily.  Marland Kitchen aspirin 325 MG tablet Take 1 tablet (325 mg total) by mouth daily.  . Cyanocobalamin (VITAMIN B-12 PO) Take 1 tablet by mouth daily.  . folic acid (FOLVITE) 1 MG tablet Take 1 tablet (1 mg total) by mouth daily.  Marland Kitchen glipiZIDE (GLUCOTROL) 10 MG tablet TAKE ONE TABLET BY MOUTH ONCE  DAILY BEFORE BREAKFAST  . lisinopril (PRINIVIL,ZESTRIL) 20 MG tablet Take 1 tablet (20 mg total) by mouth daily.  . metFORMIN (GLUCOPHAGE) 500 MG tablet Take 1 tablet (500 mg total) by mouth 2 (two) times daily with a meal.  . polyethylene glycol (MIRALAX / GLYCOLAX) packet Take 17 g by mouth daily as needed for mild constipation.  . Vitamin D, Ergocalciferol, (DRISDOL) 50000 units CAPS capsule Take 1 capsule (50,000 Units total) by mouth every 14 (fourteen) days.  . [DISCONTINUED] metFORMIN (GLUCOPHAGE) 500 MG tablet Take 1 tablet (500 mg total) by mouth daily.   No facility-administered encounter medications on file as of 07/19/2016.    ALLERGIES: Allergies  Allergen Reactions  . Penicillins Other (See Comments)    Makes patient sore.  Has patient had a PCN reaction causing immediate rash, facial/tongue/throat swelling, SOB or lightheadedness with hypotension: No Has patient had a PCN reaction causing severe rash involving mucus membranes or skin necrosis: No Has patient had a PCN reaction that required hospitalization No Has patient had a PCN reaction occurring within the last 10 years: No If all of the above answers are "NO", then may proceed with Cephalosporin use.   . Statins Other (See Comments)    Makes wrists and fingers ache   VACCINATION STATUS:  There is no immunization history on file for this patient.  HPI 71 year old female patient with medical history as above. She is here to follow-up after being seen for history of hyperparathyroidism with recent elevation of PTH associated with mild hypercalcemia.  - Her repeat labs show improved PTH, stable calcium. Patient has recent history of unexplained weight loss being worked up with imaging studies including planned colonoscopy.   - Her thyroid ultrasound shows mild goiter with no discrete nodules. - No history of diagnosed cancer. - Her A1c is higher at 9.2%  From 8.3 % , generally improving from 11.6% . She remains on  glipizide 10 mg by mouth once a day and metformin 500 mg by mouth once a day.  - She has no new complaints.  Review of Systems Constitutional:  She has gained approximately 20 pounds since last visit , +fatigue, no subjective hyperthermia/hypothermia Eyes: no blurry vision, no xerophthalmia ENT: no sore throat, no nodules palpated in throat, no dysphagia/odynophagia, no hoarseness Cardiovascular: no CP/SOB/palpitations/leg swelling Respiratory: no cough/SOB Gastrointestinal: no N/V/D/C Musculoskeletal: no muscle/joint aches Skin: no rashes Neurological: no tremors/numbness/tingling,  +dizziness Psychiatric: no depression/anxiety   Objective:    BP (!) 151/77   Pulse 70   Ht 5\' 5"  (1.651 m)   Wt 147 lb (66.7 kg)   BMI 24.46 kg/m   Wt Readings from Last 3 Encounters:  07/19/16 147 lb (66.7 kg)  01/12/16 125 lb (56.7 kg)  09/22/15 118 lb 13.3 oz (53.9 kg)    Physical Exam Constitutional: Light built, in NAD, she uses a cane to walk around. Eyes: PERRLA, EOMI, no exophthalmos ENT: moist mucous membranes. Neck: She has prominent sternocleidomastoid joints bilaterally, she has mild thyromegaly,  No cervical lymphadenopathy Cardiovascular: RRR, No MRG Respiratory: CTA B Gastrointestinal: abdomen soft, NT, ND, BS+ Musculoskeletal: no deformities, strength intact in all 4 Skin: moist, warm, no rashes Neurological: no tremor with outstretched hands, DTR normal in all 4  Recent Results (from the past 2160 hour(s))  COMPLETE METABOLIC PANEL WITH GFR     Status: Abnormal   Collection Time: 07/11/16  3:36 PM  Result Value Ref Range   Sodium 138 135 - 146 mmol/L   Potassium 4.1 3.5 - 5.3 mmol/L   Chloride 103 98 - 110 mmol/L   CO2 22 20 - 31 mmol/L   Glucose, Bld 187 (H) 65 - 99 mg/dL   BUN 22 7 - 25 mg/dL   Creat 0.94 0.50 - 0.99 mg/dL    Comment:   For patients > or = 70 years of age: The upper reference limit for Creatinine is approximately 13% higher for people identified  as African-American.      Total Bilirubin 0.3 0.2 - 1.2 mg/dL   Alkaline Phosphatase 72 33 - 130 U/L   AST 11 10 - 35 U/L   ALT 9 6 - 29 U/L   Total Protein 7.8 6.1 - 8.1 g/dL   Albumin 4.2 3.6 - 5.1 g/dL   Calcium 9.9 8.6 - 10.4 mg/dL   GFR, Est African American 72 >=60 mL/min   GFR, Est Non African American 62 >=60 mL/min  Hemoglobin A1c     Status: Abnormal   Collection Time: 07/11/16  3:36 PM  Result Value Ref Range   Hgb A1c MFr Bld 9.2 (H) <5.7 %    Comment:   For someone without known diabetes, a hemoglobin A1c value of 6.5% or greater indicates that they may have diabetes and this should be confirmed with a follow-up test.   For someone with known diabetes, a  value <7% indicates that their diabetes is well controlled and a value greater than or equal to 7% indicates suboptimal control. A1c targets should be individualized based on duration of diabetes, age, comorbid conditions, and other considerations.   Currently, no consensus exists for use of hemoglobin A1c for diagnosis of diabetes for children.      Mean Plasma Glucose 217 mg/dL  PTH, intact and calcium     Status: Abnormal   Collection Time: 07/11/16  3:36 PM  Result Value Ref Range   PTH 75 (H) 14 - 64 pg/mL   Calcium 9.9 8.6 - 10.4 mg/dL    Comment:   Interpretive Guide:                              Intact PTH               Calcium                              ----------               ------- Normal Parathyroid           Normal                   Normal Hypoparathyroidism           Low or Low Normal        Low Hyperparathyroidism      Primary                 Normal or High           High      Secondary               High                     Normal or Low      Tertiary                High                     High Non-Parathyroid   Hypercalcemia              Low or Low Normal        High   VITAMIN D 25 Hydroxy (Vit-D Deficiency, Fractures)     Status: None   Collection Time: 07/11/16  3:36 PM  Result  Value Ref Range   Vit D, 25-Hydroxy 39 30 - 100 ng/mL    Comment: Vitamin D Status           25-OH Vitamin D        Deficiency                <20 ng/mL        Insufficiency         20 - 29 ng/mL        Optimal             > or = 30 ng/mL   For 25-OH Vitamin D testing on patients on D2-supplementation and patients for whom quantitation of D2 and D3 fractions is required, the [facility] 25-OH VIT D, (D2,D3), LC/MS/MS is recommended: order code [id number] (patients > 2 yrs).     Diabetic Labs (most recent): Lab Results  Component Value Date   HGBA1C 9.2 (H) 07/11/2016  HGBA1C 8.3 (H) 12/02/2015   HGBA1C 7.9 (H) 08/10/2015   From 04/29/2015 lab work showed calcium 10.6. PTH was not reported to review today.    Assessment & Plan:   1. Hypercalcemia: Resolved - Likely driving factor is vitamin D deficiency causing secondary hyperparathyroidism. Her labs did not confirm primary hyperparathyroidism for now. - Her repeat labs show PTH Increasing to 75 from 62 along with normal calcium of 9.9. 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 - She is vitamin D replete now . -She will not need any intervention for hypercalcemia at this time.  -Malignancy related hypercalcemia is usually severe and typically above 12 mg/dL.  - She has regained approximately 20 pounds of her weight back, so far a good development for her.   - Her neck/thyroid ultrasound was unremarkable.   2. Uncontrolled type 2 diabetes mellitus without complication, without long-term current use of insulin ([place]) - Her A1c is higher at 9.2% from  8.3%, compared to  of 11.6% from September. -She will need more therapy, I advised her to continue metformin 500 mg by mouth twice a day, glipizide 10 mg by mouth with breakfast. I advised her to continue with her primary care doctor for possible initiation of basal insulin if A1c remains above 9%.    3. Essential hypertension -Uncontrolled I advised her to  continue lisinopril 30 mg by mouth every morning for now. we will reevaluate for additional therapy next visit.  4. Vitamin D deficiency: See #1 above. I will initiate vitamin D 50,000 units every 2 weeks for the next 16 weeks.  5. Goiter -Her thyroid ultrasound showed mild goiter with no discrete nodules. No intervention needed at this point.    - I advised patient to maintain close follow up with Vidal Schwalbe, MD for primary care needs. Follow up plan: Return in about 3 months (around 10/17/2016) for follow up with pre-visit labs.  Glade Lloyd, MD Phone: [phone]  Fax: [phone]   07/19/2016, 3:57 PM

## 2016-09-29 ENCOUNTER — Other Ambulatory Visit: Payer: Self-pay | Admitting: "Endocrinology

## 2016-09-30 LAB — HEMOGLOBIN A1C
HEMOGLOBIN A1C: 9.7 % — AB (ref <5.7)
Mean Plasma Glucose: 232 mg/dL

## 2016-10-02 LAB — PTH, INTACT AND CALCIUM
Calcium: 9.9 mg/dL (ref 8.6–10.4)
PTH: 92 pg/mL — ABNORMAL HIGH (ref 14–64)

## 2016-10-19 ENCOUNTER — Encounter: Payer: Self-pay | Admitting: "Endocrinology

## 2016-10-19 ENCOUNTER — Ambulatory Visit (INDEPENDENT_AMBULATORY_CARE_PROVIDER_SITE_OTHER): Payer: Medicare HMO | Admitting: "Endocrinology

## 2016-10-19 DIAGNOSIS — E1165 Type 2 diabetes mellitus with hyperglycemia: Secondary | ICD-10-CM | POA: Diagnosis not present

## 2016-10-19 DIAGNOSIS — E212 Other hyperparathyroidism: Secondary | ICD-10-CM

## 2016-10-19 DIAGNOSIS — E213 Hyperparathyroidism, unspecified: Secondary | ICD-10-CM | POA: Insufficient documentation

## 2016-10-19 DIAGNOSIS — E118 Type 2 diabetes mellitus with unspecified complications: Secondary | ICD-10-CM | POA: Diagnosis not present

## 2016-10-19 DIAGNOSIS — E559 Vitamin D deficiency, unspecified: Secondary | ICD-10-CM | POA: Diagnosis not present

## 2016-10-19 DIAGNOSIS — IMO0002 Reserved for concepts with insufficient information to code with codable children: Secondary | ICD-10-CM

## 2016-10-19 NOTE — Progress Notes (Signed)
Subjective:    Patient ID: Maria Parrish, female    DOB: [birth year], PCP Vidal Schwalbe, MD   Past Medical History:  Diagnosis Date  . Assistance needed for mobility    walks with walker  . Confusion   . Dementia   . Diabetes mellitus without complication ([place])   . Generalized weakness   . Hypercalcemia   . Hyperlipidemia   . Hyperparathyroidism ([place])   . Hypertension   . Hypertension   . Moderate protein-calorie malnutrition ([place])   . Peripheral neuropathy ([place])   . UTI (lower urinary tract infection)    Past Surgical History:  Procedure Laterality Date  . COLONOSCOPY N/A 08/12/2015   Procedure: COLONOSCOPY;  Surgeon: Daneil Dolin, MD;  [facility];  Service: Endoscopy;  Laterality: N/A;  1245pm  . ESOPHAGOGASTRODUODENOSCOPY N/A 08/12/2015   Procedure: ESOPHAGOGASTRODUODENOSCOPY (EGD);  Surgeon: Daneil Dolin, MD;  Location: [facility];  Service: Endoscopy;  Laterality: N/A;  . none     Social History   Social History  . Marital status: Widowed    Spouse name: N/A  . Number of children: 2  . Years of education: N/A   Occupational History  . fabricator for [employer]    Social History Main Topics  . Smoking status: Never Smoker  . Smokeless tobacco: Never Used  . Alcohol use No  . Drug use: No  . Sexual activity: Not Currently   Other Topics Concern  . Not on file   Social History Narrative  . No narrative on file   Outpatient Encounter Prescriptions as of 10/19/2016  Medication Sig  . amLODipine (NORVASC) 10 MG tablet Take 1 tablet (10 mg total) by mouth daily.  Marland Kitchen aspirin 325 MG tablet Take 1 tablet (325 mg total) by mouth daily.  . Cyanocobalamin (VITAMIN B-12 PO) Take 1 tablet by mouth daily.  . folic acid (FOLVITE) 1 MG tablet Take 1 tablet (1 mg total) by mouth daily.  Marland Kitchen glipiZIDE (GLUCOTROL) 10 MG tablet TAKE ONE TABLET BY MOUTH ONCE DAILY BEFORE BREAKFAST  . lisinopril (PRINIVIL,ZESTRIL) 20 MG tablet Take 1 tablet (20 mg total) by mouth  daily.  Marland Kitchen lisinopril-hydrochlorothiazide (PRINZIDE,ZESTORETIC) 20-12.5 MG tablet Take 1 tablet by mouth daily.  . metFORMIN (GLUCOPHAGE) 500 MG tablet Take 1 tablet (500 mg total) by mouth 2 (two) times daily with a meal.  . polyethylene glycol (MIRALAX / GLYCOLAX) packet Take 17 g by mouth daily as needed for mild constipation.  . Vitamin D, Ergocalciferol, (DRISDOL) 50000 units CAPS capsule Take 1 capsule (50,000 Units total) by mouth every 14 (fourteen) days.   No facility-administered encounter medications on file as of 10/19/2016.    ALLERGIES: Allergies  Allergen Reactions  . Penicillins Other (See Comments)    Makes patient sore.  Has patient had a PCN reaction causing immediate rash, facial/tongue/throat swelling, SOB or lightheadedness with hypotension: No Has patient had a PCN reaction causing severe rash involving mucus membranes or skin necrosis: No Has patient had a PCN reaction that required hospitalization No Has patient had a PCN reaction occurring within the last 10 years: No If all of the above answers are "NO", then may proceed with Cephalosporin use.   . Statins Other (See Comments)    Makes wrists and fingers ache   VACCINATION STATUS:  There is no immunization history on file for this patient.  HPI 71 year old female patient with medical history as above. She is here to follow-up after being seen for  history of hyperparathyroidism with recent elevation of PTH associated with mild hypercalcemia.  - Her repeat labs show increasing PTH  To 92 PTH, stable calcium. Patient has recent history of unexplained weight loss being worked up with imaging studies including planned colonoscopy.   - Her thyroid ultrasound shows mild goiter with no discrete nodules. - No history of diagnosed cancer. - Her A1c is higher at 9.7%  From 8.3 % , generally improving from 11.6% . She remains on glipizide 10 mg by mouth once a day and metformin 500 mg by mouth once a day..  - She has  no new complaints.  Review of Systems Constitutional:  She has gained approximately 20 pounds since last visit , +fatigue, no subjective hyperthermia/hypothermia Eyes: no blurry vision, no xerophthalmia ENT: no sore throat, no nodules palpated in throat, no dysphagia/odynophagia, no hoarseness Cardiovascular: no CP/SOB/palpitations/leg swelling Respiratory: no cough/SOB Gastrointestinal: no N/V/D/C Musculoskeletal: no muscle/joint aches Skin: no rashes Neurological: no tremors/numbness/tingling,  +dizziness Psychiatric: no depression/anxiety   Objective:    BP (!) 150/76   Pulse 79   Ht 5\' 5"  (1.651 m)   Wt 151 lb (68.5 kg)   BMI 25.13 kg/m   Wt Readings from Last 3 Encounters:  10/19/16 151 lb (68.5 kg)  07/19/16 147 lb (66.7 kg)  01/12/16 125 lb (56.7 kg)    Physical Exam Constitutional: Light built, in NAD, she uses a cane to walk around. Eyes: PERRLA, EOMI, no exophthalmos ENT: moist mucous membranes. Neck: She has prominent sternocleidomastoid joints bilaterally, she has mild thyromegaly,  No cervical lymphadenopathy Cardiovascular: RRR, No MRG Respiratory: CTA B Gastrointestinal: abdomen soft, NT, ND, BS+ Musculoskeletal: no deformities, strength intact in all 4 Skin: moist, warm, no rashes Neurological: no tremor with outstretched hands, DTR normal in all 4  Recent Results (from the past 2160 hour(s))  Hemoglobin A1c     Status: Abnormal   Collection Time: 09/29/16  4:19 PM  Result Value Ref Range   Hgb A1c MFr Bld 9.7 (H) <5.7 %    Comment:   For someone without known diabetes, a hemoglobin A1c value of 6.5% or greater indicates that they may have diabetes and this should be confirmed with a follow-up test.   For someone with known diabetes, a value <7% indicates that their diabetes is well controlled and a value greater than or equal to 7% indicates suboptimal control. A1c targets should be individualized based on duration of diabetes, age, comorbid  conditions, and other considerations.   Currently, no consensus exists for use of hemoglobin A1c for diagnosis of diabetes for children.      Mean Plasma Glucose 232 mg/dL  PTH, intact and calcium     Status: Abnormal   Collection Time: 09/29/16  4:19 PM  Result Value Ref Range   PTH 92 (H) 14 - 64 pg/mL   Calcium 9.9 8.6 - 10.4 mg/dL    Comment:   Interpretive Guide:                              Intact PTH               Calcium                              ----------               ------- Normal Parathyroid  Normal                   Normal Hypoparathyroidism           Low or Low Normal        Low Hyperparathyroidism      Primary                 Normal or High           High      Secondary               High                     Normal or Low      Tertiary                High                     High Non-Parathyroid   Hypercalcemia              Low or Low Normal        High     Diabetic Labs (most recent): Lab Results  Component Value Date   HGBA1C 9.7 (H) 09/29/2016   HGBA1C 9.2 (H) 07/11/2016   HGBA1C 8.3 (H) 12/02/2015   From 04/29/2015 lab work showed calcium 10.6. PTH was not reported to review today.    Assessment & Plan:   1. Hypercalcemia: Resolved  - Her repeat labs show PTH Increasing to 92 from 75 along with normal calcium of 9.9. 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 - She is vitamin D replete now . -She will not need any intervention for hypercalcemia at this time.  -Malignancy related hypercalcemia is usually severe and typically above 12 mg/dL.  - She has regained approximately 20 pounds of her weight back, so far a good development for her.   - Her neck/thyroid ultrasound was unremarkable.   2. Uncontrolled type 2 diabetes mellitus without complication, without long-term current use of insulin ([place]) - Her A1c is higher at 9.7% from  8.3%, compared to  of 11.6% from September. -She will need more therapy. She  declined an offer for basal insulin, she promises to do better on her diet.   I advised her to continue metformin 500 mg by mouth twice a day, glipizide 10 mg by mouth with breakfast. She will be reapproached for  possible initiation of basal insulin if A1c remains above 9%.    3. Essential hypertension -Uncontrolled I advised her to continue lisinopril 30 mg by mouth every morning for now. She is hesitant to add any additional therapy.  4. Vitamin D deficiency: See #1 above. I will initiate vitamin D 50,000 units every 2 weeks for the next 16 weeks.  5. Goiter -Her thyroid ultrasound showed mild goiter with no discrete nodules. No intervention needed at this point.    - I advised patient to maintain close follow up with Vidal Schwalbe, MD for primary care needs. Follow up plan: Return in about 3 months (around 01/18/2017) for follow up with pre-visit labs.  Glade Lloyd, MD Phone: [phone]  Fax: [phone]   10/19/2016, 4:09 PM

## 2017-01-29 ENCOUNTER — Other Ambulatory Visit: Payer: Self-pay | Admitting: "Endocrinology

## 2017-01-29 LAB — COMPREHENSIVE METABOLIC PANEL
ALBUMIN: 3.6 g/dL (ref 3.6–5.1)
ALT: 11 U/L (ref 6–29)
AST: 12 U/L (ref 10–35)
Alkaline Phosphatase: 72 U/L (ref 33–130)
BILIRUBIN TOTAL: 0.3 mg/dL (ref 0.2–1.2)
BUN: 16 mg/dL (ref 7–25)
CALCIUM: 9.4 mg/dL (ref 8.6–10.4)
CO2: 24 mmol/L (ref 20–31)
Chloride: 103 mmol/L (ref 98–110)
Creat: 0.78 mg/dL (ref 0.60–0.93)
GLUCOSE: 171 mg/dL — AB (ref 65–99)
POTASSIUM: 4.2 mmol/L (ref 3.5–5.3)
Sodium: 137 mmol/L (ref 135–146)
Total Protein: 7.1 g/dL (ref 6.1–8.1)

## 2017-01-30 LAB — HEMOGLOBIN A1C
Hgb A1c MFr Bld: 9.4 % — ABNORMAL HIGH (ref <5.7)
MEAN PLASMA GLUCOSE: 223 mg/dL

## 2017-02-01 LAB — PTH, INTACT AND CALCIUM
CALCIUM: 9.4 mg/dL (ref 8.6–10.4)
PTH: 114 pg/mL — AB (ref 14–64)

## 2017-02-07 ENCOUNTER — Ambulatory Visit: Payer: Medicare HMO | Admitting: "Endocrinology

## 2017-02-26 ENCOUNTER — Ambulatory Visit (INDEPENDENT_AMBULATORY_CARE_PROVIDER_SITE_OTHER): Payer: Medicare HMO | Admitting: "Endocrinology

## 2017-02-26 ENCOUNTER — Encounter: Payer: Self-pay | Admitting: "Endocrinology

## 2017-02-26 VITALS — BP 148/77 | HR 72 | Ht 65.0 in | Wt 153.0 lb

## 2017-02-26 DIAGNOSIS — E1165 Type 2 diabetes mellitus with hyperglycemia: Secondary | ICD-10-CM | POA: Diagnosis not present

## 2017-02-26 DIAGNOSIS — IMO0002 Reserved for concepts with insufficient information to code with codable children: Secondary | ICD-10-CM

## 2017-02-26 DIAGNOSIS — E212 Other hyperparathyroidism: Secondary | ICD-10-CM

## 2017-02-26 DIAGNOSIS — E118 Type 2 diabetes mellitus with unspecified complications: Secondary | ICD-10-CM

## 2017-02-26 MED ORDER — VITAMIN D (ERGOCALCIFEROL) 1.25 MG (50000 UNIT) PO CAPS: 50000.0000 [IU] | ORAL_CAPSULE | ORAL | 0 refills | Status: DC

## 2017-02-26 NOTE — Progress Notes (Signed)
Subjective:    Patient ID: Maria Parrish, female    DOB: [birth year], PCP Vidal Schwalbe, MD   Past Medical History:  Diagnosis Date  . Assistance needed for mobility    walks with walker  . Confusion   . Dementia   . Diabetes mellitus without complication ([place])   . Generalized weakness   . Hypercalcemia   . Hyperlipidemia   . Hyperparathyroidism ([place])   . Hypertension   . Hypertension   . Moderate protein-calorie malnutrition ([place])   . Peripheral neuropathy   . UTI (lower urinary tract infection)    Past Surgical History:  Procedure Laterality Date  . COLONOSCOPY N/A 08/12/2015   Procedure: COLONOSCOPY;  Surgeon: Daneil Dolin, MD;  [facility];  Service: Endoscopy;  Laterality: N/A;  1245pm  . ESOPHAGOGASTRODUODENOSCOPY N/A 08/12/2015   Procedure: ESOPHAGOGASTRODUODENOSCOPY (EGD);  Surgeon: Daneil Dolin, MD;  [facility];  Service: Endoscopy;  Laterality: N/A;  . none     Social History   Social History  . Marital status: Widowed    Spouse name: N/A  . Number of children: 2  . Years of education: N/A   Occupational History  . fabricator for [employer]    Social History Main Topics  . Smoking status: Never Smoker  . Smokeless tobacco: Never Used  . Alcohol use No  . Drug use: No  . Sexual activity: Not Currently   Other Topics Concern  . None   Social History Narrative  . None   Outpatient Encounter Prescriptions as of 02/26/2017  Medication Sig  . aspirin 325 MG tablet Take 1 tablet (325 mg total) by mouth daily.  . Cyanocobalamin (VITAMIN B-12 PO) Take 1 tablet by mouth daily.  . folic acid (FOLVITE) 1 MG tablet Take 1 tablet (1 mg total) by mouth daily.  Marland Kitchen glipiZIDE (GLUCOTROL) 10 MG tablet TAKE ONE TABLET BY MOUTH ONCE DAILY BEFORE BREAKFAST  . lisinopril (PRINIVIL,ZESTRIL) 20 MG tablet Take 1 tablet (20 mg total) by mouth daily.  Marland Kitchen lisinopril-hydrochlorothiazide (PRINZIDE,ZESTORETIC) 20-12.5 MG tablet Take 1 tablet by mouth daily.   . metFORMIN (GLUCOPHAGE) 500 MG tablet Take 1 tablet (500 mg total) by mouth 2 (two) times daily with a meal.  . polyethylene glycol (MIRALAX / GLYCOLAX) packet Take 17 g by mouth daily as needed for mild constipation.  . Vitamin D, Ergocalciferol, (DRISDOL) 50000 units CAPS capsule Take 1 capsule (50,000 Units total) by mouth every 14 (fourteen) days.  . [DISCONTINUED] amLODipine (NORVASC) 10 MG tablet Take 1 tablet (10 mg total) by mouth daily.  . [DISCONTINUED] Vitamin D, Ergocalciferol, (DRISDOL) 50000 units CAPS capsule Take 1 capsule (50,000 Units total) by mouth every 14 (fourteen) days.   No facility-administered encounter medications on file as of 02/26/2017.    ALLERGIES: Allergies  Allergen Reactions  . Penicillins Other (See Comments)    Makes patient sore.  Has patient had a PCN reaction causing immediate rash, facial/tongue/throat swelling, SOB or lightheadedness with hypotension: No Has patient had a PCN reaction causing severe rash involving mucus membranes or skin necrosis: No Has patient had a PCN reaction that required hospitalization No Has patient had a PCN reaction occurring within the last 10 years: No If all of the above answers are "NO", then may proceed with Cephalosporin use.   . Statins Other (See Comments)    Makes wrists and fingers ache   VACCINATION STATUS:  There is no immunization history on file for this patient.  HPI 71 year old female patient with medical history as above. She is here to follow-up after being seen for history of hyperparathyroidism with recent elevation of PTH associated with mild hypercalcemia.  - Her repeat labs show increasing PTH  To 114  PTH, stable calcium of 9.4. She has steady body weight.  - Her thyroid ultrasound shows mild goiter with no discrete nodules. - No history of diagnosed cancer. - Her  Recent A1c is 9.4% unchanged from 9.7% during her last visit.  She remains on glipizide 10 mg by mouth once a day and  metformin 500 mg by mouth once a day. She did not bring any meter nor logs to review. - She has no new complaints.  Review of Systems Constitutional:  He has a steady weight, since last visit , +fatigue, no subjective hyperthermia/hypothermia Eyes: no blurry vision, no xerophthalmia ENT: no sore throat, no nodules palpated in throat, no dysphagia/odynophagia, no hoarseness Cardiovascular: no CP/SOB/palpitations/leg swelling Respiratory: no cough/SOB Gastrointestinal: no N/V/D/C Musculoskeletal: no muscle/joint aches Skin: no rashes Neurological: no tremors/numbness/tingling,  +dizziness Psychiatric: no depression/anxiety   Objective:    BP (!) 148/77   Pulse 72   Ht 5\' 5"  (1.651 m)   Wt 153 lb (69.4 kg)   BMI 25.46 kg/m   Wt Readings from Last 3 Encounters:  02/26/17 153 lb (69.4 kg)  10/19/16 151 lb (68.5 kg)  07/19/16 147 lb (66.7 kg)    Physical Exam Constitutional:  Appropriate weight for height, in NAD, she uses a cane to walk around. Eyes: PERRLA, EOMI, no exophthalmos ENT: moist mucous membranes. Neck: She has prominent sternocleidomastoid joints bilaterally, she has mild thyromegaly,  No cervical lymphadenopathy- unchanged from last exam. Cardiovascular: RRR, No MRG Respiratory: CTA B Gastrointestinal: abdomen soft, NT, ND, BS+ Musculoskeletal: no deformities, strength intact in all 4 Skin: moist, warm, no rashes Neurological: no tremor with outstretched hands, DTR normal in all 4  Recent Results (from the past 2160 hour(s))  Comprehensive metabolic panel     Status: Abnormal   Collection Time: 01/29/17 10:54 AM  Result Value Ref Range   Sodium 137 135 - 146 mmol/L   Potassium 4.2 3.5 - 5.3 mmol/L   Chloride 103 98 - 110 mmol/L   CO2 24 20 - 31 mmol/L   Glucose, Bld 171 (H) 65 - 99 mg/dL   BUN 16 7 - 25 mg/dL   Creat 0.78 0.60 - 0.93 mg/dL    Comment:   For patients > or = 70 years of age: The upper reference limit for Creatinine is approximately 13%  higher for people identified as African-American.      Total Bilirubin 0.3 0.2 - 1.2 mg/dL   Alkaline Phosphatase 72 33 - 130 U/L   AST 12 10 - 35 U/L   ALT 11 6 - 29 U/L   Total Protein 7.1 6.1 - 8.1 g/dL   Albumin 3.6 3.6 - 5.1 g/dL   Calcium 9.4 8.6 - 10.4 mg/dL  Hemoglobin A1c     Status: Abnormal   Collection Time: 01/29/17 10:54 AM  Result Value Ref Range   Hgb A1c MFr Bld 9.4 (H) <5.7 %    Comment:   For someone without known diabetes, a hemoglobin A1c value of 6.5% or greater indicates that they may have diabetes and this should be confirmed with a follow-up test.   For someone with known diabetes, a value <7% indicates that their diabetes is well controlled and a value greater than or equal to 7%  indicates suboptimal control. A1c targets should be individualized based on duration of diabetes, age, comorbid conditions, and other considerations.   Currently, no consensus exists for use of hemoglobin A1c for diagnosis of diabetes for children.      Mean Plasma Glucose 223 mg/dL  PTH, intact and calcium     Status: Abnormal   Collection Time: 01/29/17 10:54 AM  Result Value Ref Range   PTH 114 (H) 14 - 64 pg/mL   Calcium 9.4 8.6 - 10.4 mg/dL    Comment:   Interpretive Guide:                              Intact PTH               Calcium                              ----------               ------- Normal Parathyroid           Normal                   Normal Hypoparathyroidism           Low or Low Normal        Low Hyperparathyroidism      Primary                 Normal or High           High      Secondary               High                     Normal or Low      Tertiary                High                     High Non-Parathyroid   Hypercalcemia              Low or Low Normal        High     Diabetic Labs (most recent): Lab Results  Component Value Date   HGBA1C 9.4 (H) 01/29/2017   HGBA1C 9.7 (H) 09/29/2016   HGBA1C 9.2 (H) 07/11/2016    Assessment &  Plan:   1. Hypercalcemia: Resolved  - Her repeat labs show normal calcium of 9.4, However her PTH is increasing to 114 from 92. 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 - She is vitamin D replete now . -She will not need any intervention for hypercalcemia at this time. - Her neck/thyroid ultrasound was unremarkable.   2. Uncontrolled type 2 diabetes mellitus without complication, without long-term current use of insulin ([facility]) - Her A1c is remains high at 9.4% compared to 9.7% during her last visit.  - She insists that her A1c at her PMD this visit was 8.4%.  - She will need more therapy. She declined an offer for basal insulin, she promises to do better on her diet.   I advised her to continue metformin 500 mg by mouth twice a day, glipizide 10 mg by mouth with breakfast. She agrees to continue to monitor blood glucose 2 times a day-before breakfast and at bedtime and return with her  meter and logs during her next visit. She will be reapproached for  possible initiation of basal insulin if A1c remains above 9%.   3. Essential hypertension -Uncontrolled I advised her to continue lisinopril 30 mg by mouth every morning for now. She is hesitant to add any additional therapy, unwilling to discuss this with her PMD.  4. Vitamin D deficiency: See #1 above. I will initiate vitamin D 50,000 units every 2 weeks for the next 16 weeks.  5. Goiter -Her thyroid ultrasound showed mild goiter with no discrete nodules. No intervention needed at this point.   - I advised patient to maintain close follow up with Vidal Schwalbe, MD for primary care needs. Follow up plan: Return in about 3 months (around 05/29/2017) for meter, and logs.  Glade Lloyd, MD Phone: [phone]  Fax: [phone]   02/26/2017, 4:18 PM

## 2017-03-02 ENCOUNTER — Encounter (HOSPITAL_COMMUNITY): Payer: Self-pay

## 2017-03-02 ENCOUNTER — Emergency Department (HOSPITAL_COMMUNITY)
Admission: EM | Admit: 2017-03-02 | Discharge: 2017-03-02 | Disposition: A | Discharge status: Home / Self Care | Payer: Medicare HMO | Attending: Emergency Medicine | Admitting: Emergency Medicine

## 2017-03-02 DIAGNOSIS — Z7982 Long term (current) use of aspirin: Secondary | ICD-10-CM | POA: Diagnosis not present

## 2017-03-02 DIAGNOSIS — Z79899 Other long term (current) drug therapy: Secondary | ICD-10-CM | POA: Diagnosis not present

## 2017-03-02 DIAGNOSIS — E119 Type 2 diabetes mellitus without complications: Secondary | ICD-10-CM | POA: Insufficient documentation

## 2017-03-02 DIAGNOSIS — R51 Headache: Secondary | ICD-10-CM | POA: Diagnosis present

## 2017-03-02 DIAGNOSIS — I10 Essential (primary) hypertension: Secondary | ICD-10-CM | POA: Diagnosis not present

## 2017-03-02 DIAGNOSIS — Z7984 Long term (current) use of oral hypoglycemic drugs: Secondary | ICD-10-CM | POA: Diagnosis not present

## 2017-03-02 DIAGNOSIS — G4489 Other headache syndrome: Secondary | ICD-10-CM | POA: Diagnosis not present

## 2017-03-02 NOTE — ED Provider Notes (Signed)
[facility] DEPT Provider Note   CSN: [id number] Arrival date & time: 03/02/17  0015     History   Chief Complaint Chief Complaint  Patient presents with  . Headache    bp running high    HPI Maria Parrish is a 71 y.o. female.  The history is provided by the patient.  Headache   This is a new problem. The current episode started 3 to 5 hours ago. The problem occurs constantly. The problem has not changed since onset.The pain is located in the frontal region. The pain is mild. The pain does not radiate. Pertinent negatives include no fever, no chest pressure, no shortness of breath and no vomiting. She has tried nothing for the symptoms.  pt reports earlier in the night she noted mild HA She took her BP and it was high, SBP>200 She also reports mild neck/shoulder pain No other symptoms - denies fever/vomiting No visual changes No cp/sob No focal weakness No slurred speech No confusion She takes lisinopril and lisinopril/HCTZ   Past Medical History:  Diagnosis Date  . Assistance needed for mobility    walks with walker  . Confusion   . Dementia   . Diabetes mellitus without complication ([place])   . Generalized weakness   . Hypercalcemia   . Hyperlipidemia   . Hyperparathyroidism ([place])   . Hypertension   . Hypertension   . Moderate protein-calorie malnutrition ([place])   . Peripheral neuropathy   . UTI (lower urinary tract infection)     Patient Active Problem List   Diagnosis Date Noted  . Other hyperparathyroidism ([place]) 10/19/2016  . Vitamin D deficiency 01/12/2016  . Altered mental status 09/22/2015  . Left hemiparesis ([place]) 09/22/2015  . Acute encephalopathy 09/22/2015  . Palliative care encounter   . UTI (urinary tract infection) 09/14/2015  . Dementia 09/14/2015  . General weakness 09/14/2015  . Subacute confusional state 09/14/2015  . Uncontrolled type 2 diabetes mellitus with complication, without long-term current use of insulin ([place]) 09/14/2015  .  Confusion 09/14/2015  . Fall at home   . Elevated parathyroid hormone 09/09/2015  . Mucosal abnormality of stomach   . Diverticulosis of colon without hemorrhage   . Hypercalcemia 08/10/2015  . Goiter 08/10/2015  . Abnormal weight loss 07/28/2015  . Neuropathy 03/26/2015  . Hypokalemia 03/26/2015  . Essential hypertension 03/26/2015  . Hyperlipidemia 03/26/2015  . Malnutrition of moderate degree ([place]) 03/26/2015    Past Surgical History:  Procedure Laterality Date  . COLONOSCOPY N/A 08/12/2015   Procedure: COLONOSCOPY;  Surgeon: Daneil Dolin, MD;  Location[facility];  Service: Endoscopy;  Laterality: N/A;  1245pm  . ESOPHAGOGASTRODUODENOSCOPY N/A 08/12/2015   Procedure: ESOPHAGOGASTRODUODENOSCOPY (EGD);  Surgeon: Daneil Dolin, MD;  Location: [facility];  Service: Endoscopy;  Laterality: N/A;  . none      OB History    No data available       Home Medications    Prior to Admission medications   Medication Sig Start Date End Date Taking? Authorizing Provider  aspirin 325 MG tablet Take 1 tablet (325 mg total) by mouth daily. 09/24/15   Rama, Venetia Maxon, MD  Cyanocobalamin (VITAMIN B-12 PO) Take 1 tablet by mouth daily.    [provider]  folic acid (FOLVITE) 1 MG tablet Take 1 tablet (1 mg total) by mouth daily. 09/19/15   Nita Sells, MD  glipiZIDE (GLUCOTROL) 10 MG tablet TAKE ONE TABLET BY MOUTH ONCE DAILY BEFORE BREAKFAST 02/23/16  Cassandria Anger, MD  lisinopril (PRINIVIL,ZESTRIL) 20 MG tablet Take 1 tablet (20 mg total) by mouth daily. 03/27/15   Kathie Dike, MD  lisinopril-hydrochlorothiazide (PRINZIDE,ZESTORETIC) 20-12.5 MG tablet Take 1 tablet by mouth daily.    [provider]  metFORMIN (GLUCOPHAGE) 500 MG tablet Take 1 tablet (500 mg total) by mouth 2 (two) times daily with a meal. 07/19/16   Nida, Marella Chimes, MD  polyethylene glycol (MIRALAX / GLYCOLAX) packet Take 17 g by mouth daily as needed for mild constipation.  09/19/15   Nita Sells, MD  Vitamin D, Ergocalciferol, (DRISDOL) 50000 units CAPS capsule Take 1 capsule (50,000 Units total) by mouth every 14 (fourteen) days. 02/26/17   Cassandria Anger, MD    Family History Family History  Problem Relation Age of Onset  . Hypertension Brother   . Hypertension Brother   . Diabetes Brother   . Diabetes Father   . Heart attack Mother   . Colon cancer Neg Hx     Social History Social History  Substance Use Topics  . Smoking status: Never Smoker  . Smokeless tobacco: Never Used  . Alcohol use No     Allergies   Penicillins and Statins   Review of Systems Review of Systems  Constitutional: Negative for fever.  Eyes: Negative for visual disturbance.  Respiratory: Negative for shortness of breath.   Cardiovascular: Negative for chest pain.  Gastrointestinal: Negative for vomiting.  Neurological: Positive for headaches. Negative for speech difficulty and weakness.  Psychiatric/Behavioral: Negative for confusion.  All other systems reviewed and are negative.    Physical Exam Updated Vital Signs BP (!) 197/81   Pulse 69   Temp 98.7 F (37.1 C) (Oral)   Resp 15   Ht 1.651 m (5\' 5" )   Wt 69.4 kg (153 lb)   SpO2 97%   BMI 25.46 kg/m   Physical Exam CONSTITUTIONAL: Well developed/well nourished, elderly HEAD: Normocephalic/atraumatic EYES: EOMI/PERRL, no nystagmus, no ptosis ENMT: Mucous membranes moist NECK: supple no meningeal signs, no bruits CV: S1/S2 noted, no murmurs/rubs/gallops noted LUNGS: Lungs are clear to auscultation bilaterally, no apparent distress ABDOMEN: soft, nontender, no rebound or guarding NEURO:Awake/alert, face symmetric, no arm or leg drift is noted Equal 5/5 strength with shoulder abduction, elbow flex/extension, wrist flex/extension in upper extremities  Equal 5/5 strength with hip flexion,knee flex/extension, foot dorsi/plantar flexion Cranial nerves 3/4/5/6/01/22/09/11/12 tested and  intact Gait normal without ataxia No past pointing Sensation to light touch intact in all extremities EXTREMITIES: pulses normal, full ROM SKIN: warm, color normal PSYCH: no abnormalities of mood noted  ED Treatments / Results  Labs (all labs ordered are listed, but only abnormal results are displayed) Labs Reviewed - No data to display  EKG  EKG Interpretation None       Radiology No results found.  Procedures Procedures (including critical care time)  Medications Ordered in ED Medications - No data to display   Initial Impression / Assessment and Plan / ED Course  I have reviewed the triage vital signs and the nursing notes.   Pt reports mild HA and elevated BP No signs of acute hypertensive emergency She reports med compliance and appears to be well taken care of by family/sons She is in no distress No signs of stroke or other emergent process She may just need another BP agent After long discussion with patient/family, will d/c home She declines any pain meds and I don't feel workup indicated She will call PCP later in the day  for close evaluation Doubt SAH/meningitis/temporal arteritis at this time  We discussed strict ER return precautions   Final Clinical Impressions(s) / ED Diagnoses   Final diagnoses:  Essential hypertension  Other headache syndrome    New Prescriptions Discharge Medication List as of 03/02/2017  1:25 AM       Ripley Fraise, MD 03/02/17 [phone]

## 2017-03-02 NOTE — ED Triage Notes (Signed)
Pt reports dull headache onset today, checked bp tonight and was 200's/100's.

## 2017-04-30 IMAGING — MR MR HEAD W/O CM
6 of 10 series · 21 of 48 positions shown · non-contrast
Comparison: CT head without contrast 09/14/2015, 03/25/2015 and
03/27/2009. Since 03/25/2015 but This has not significantly changed.

CLINICAL DATA: Confusion and multiple falls.

EXAM:
MRI HEAD WITHOUT CONTRAST
TECHNIQUE: Multiplanar, multiecho pulse sequences of the brain and surrounding
structures were obtained without intravenous contrast.

[Series 5: T1 · sagittal · 5.0mm · 0.41mm/px · 3 of 21 slices shown (1 of 2)]
[im 1/21]
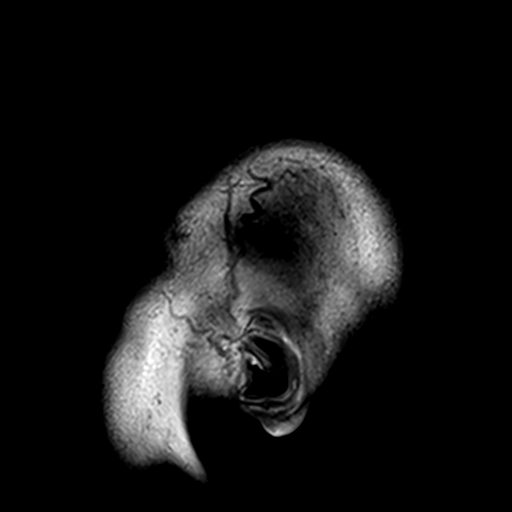
[im 11/21]
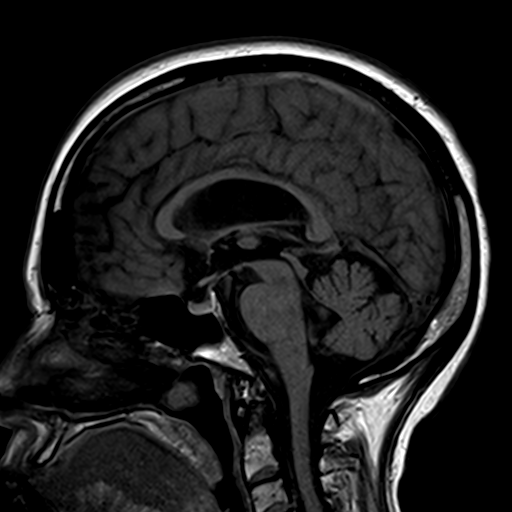
[im 21/21]
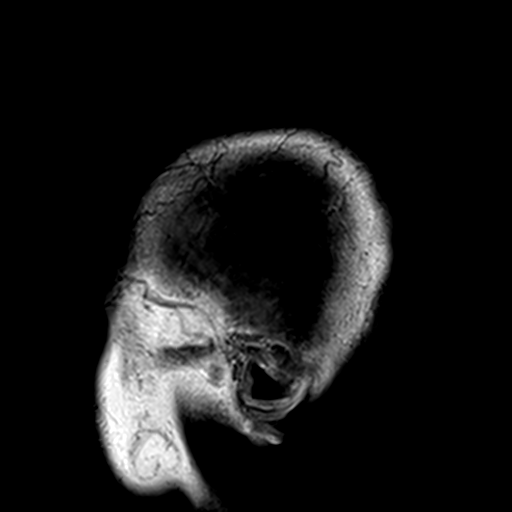

[Series 6: T2 · axial · 5.0mm · 0.48mm/px · z∈[-27,+114]mm · 3 of 23 slices shown (1 of 2)]
[im 1/23]
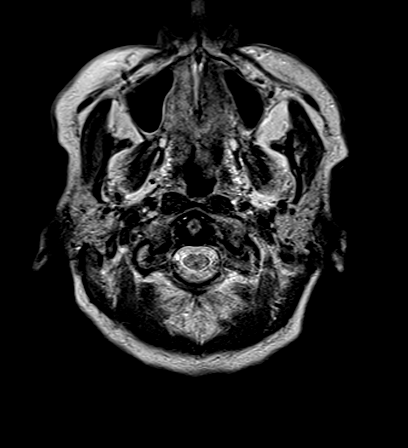
[im 12/23]
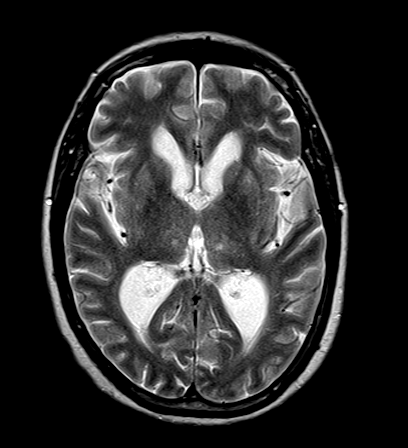
[im 23/23]
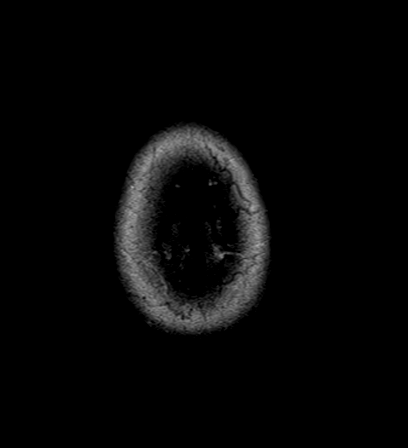

[Series 7: FLAIR · axial · 5.0mm · 0.34mm/px · z∈[-26,+115]mm · 3 of 23 slices shown]
[im 1/23]
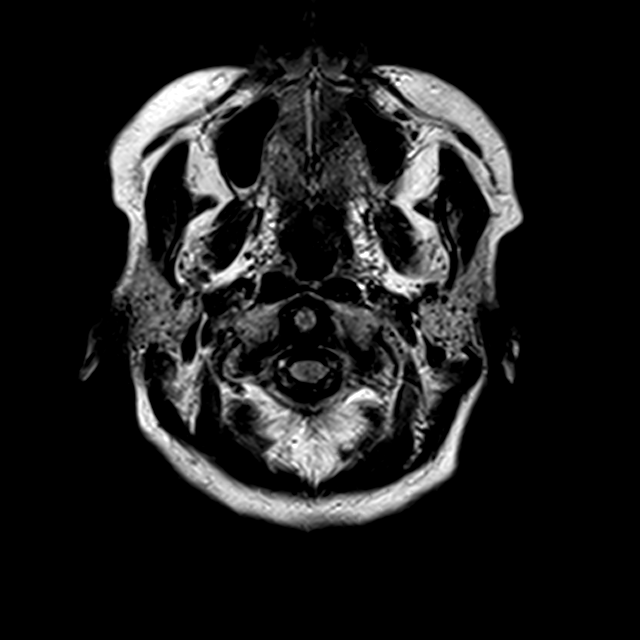
[im 12/23]
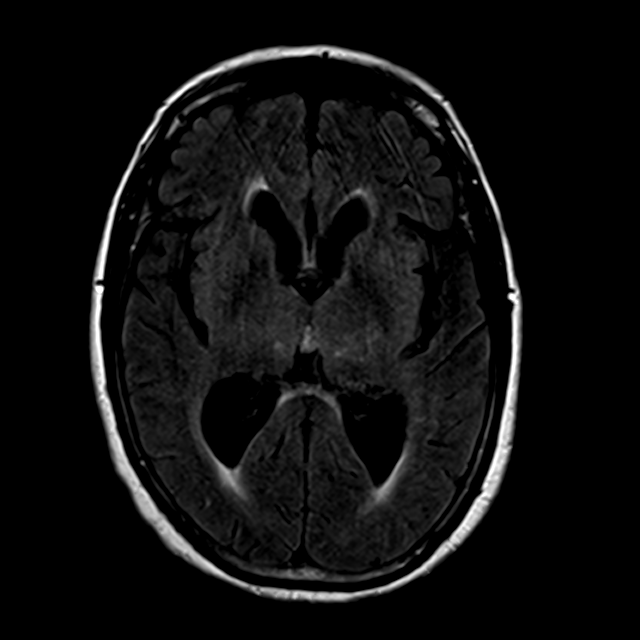
[im 23/23]
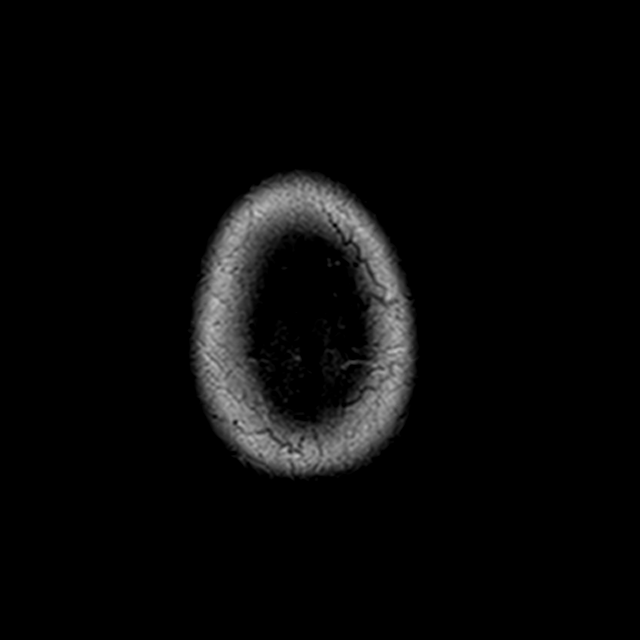

[Series 8: T1 · axial · 2.0mm · 0.43mm/px · z∈[-32,+116]mm · 8 of 76 slices shown (2 of 2)]
[im 1/76]
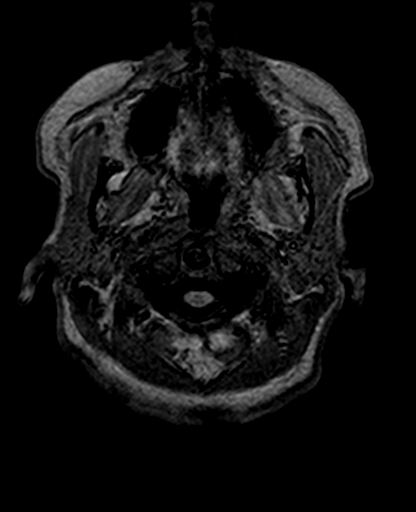
[im 10/76]
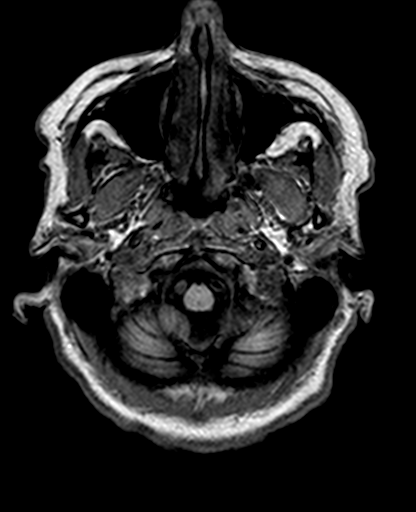
[im 19/76]
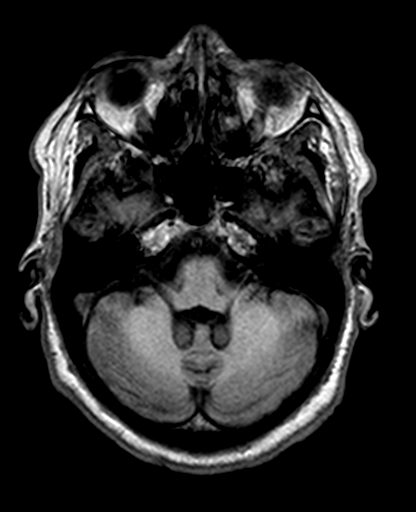
[im 29/76]
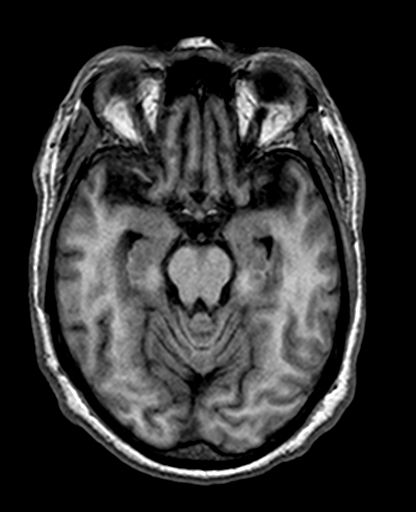
[im 47/76]
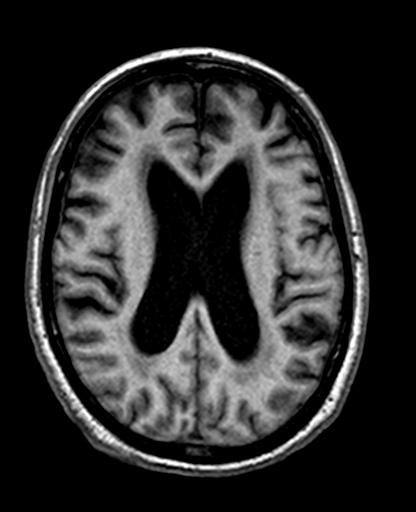
[im 57/76]
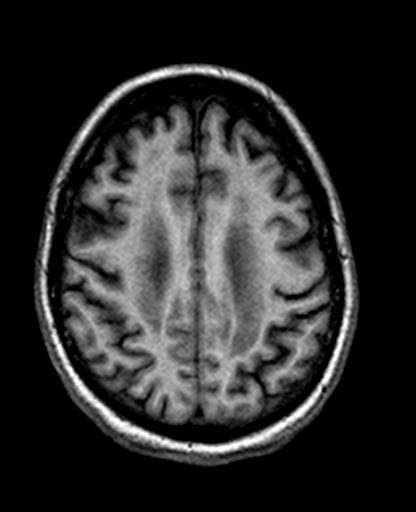
[im 66/76]
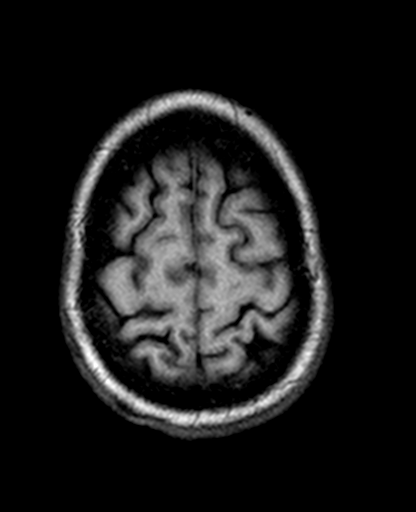
[im 76/76]
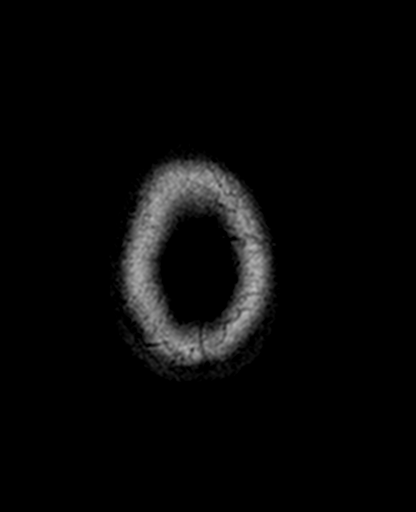

[Series 9: trauma axial · axial · 5.0mm · 0.42mm/px · 1 of 23 slices shown]
[im 1/23]
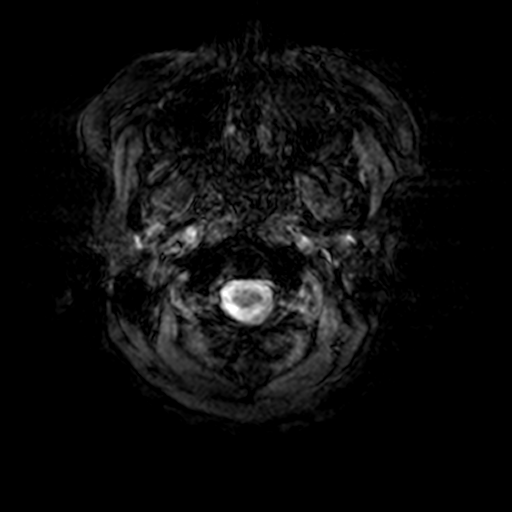

[Series 10: T2 · coronal · 5.0mm · 0.44mm/px · 3 of 28 slices shown (2 of 2)]
[im 1/28]
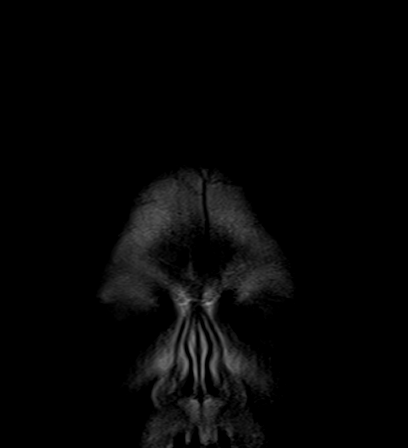
[im 14/28]
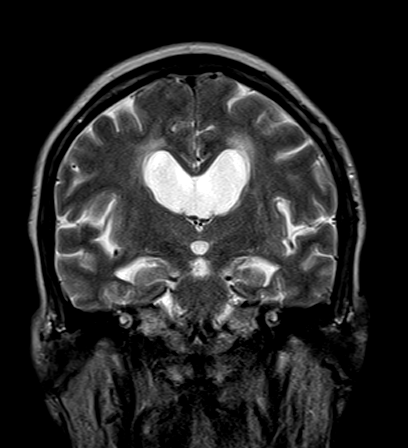
[im 28/28]
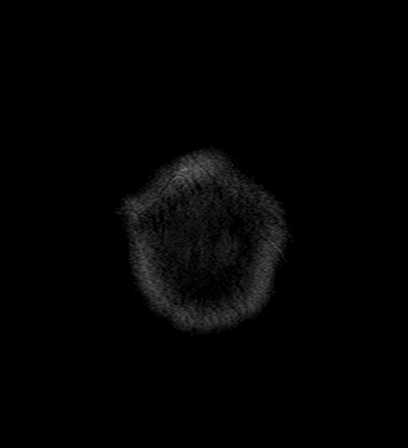

[21 of 48 positions shown; findings below may reference images not displayed]

FINDINGS: The ventricles are dilated. This has not significantly changed since
03/25/2015 but has progressed since 3878. Moderate confluent
periventricular T2 changes are evident bilaterally. The third
ventricle and fourth ventricle are within normal limits for size.
There is mild diffuse atrophy is well.

No acute infarct is present. There is no acute hemorrhage or mass
lesion.

No significant extra-axial fluid collection is present. The internal
auditory canals are within normal limits bilaterally. The cerebellum
and brainstem are within normal limits.

Flow is present in the major intracranial arteries. The globes and
orbits are intact. Mild mucosal thickening is present in the ethmoid
air cells and maxillary sinuses bilaterally. Sphenoid sinuses are
clear. The frontal sinuses are clear. The mastoid air cells are
clear.

The skullbase is within normal limits. Midline sagittal images are
unremarkable.
IMPRESSION: 1. Mild ventricular dilation in diffuse periventricular T2
hyperintensity. Although the ventricular size has not changed
significantly since [DATE], there is progression since 3878. This
raises concern for hydrocephalus.
2. Mild atrophy is also present, but disproportionate to the
ventricular size and periventricular white matter change.
3. No evidence for acute infarct.

## 2017-05-01 ENCOUNTER — Emergency Department (HOSPITAL_COMMUNITY)
Admission: EM | Admit: 2017-05-01 | Discharge: 2017-05-01 | Disposition: A | Discharge status: Home / Self Care | Payer: Medicare HMO | Attending: Emergency Medicine | Admitting: Emergency Medicine

## 2017-05-01 ENCOUNTER — Emergency Department (HOSPITAL_COMMUNITY): Payer: Medicare HMO

## 2017-05-01 ENCOUNTER — Encounter (HOSPITAL_COMMUNITY): Payer: Self-pay

## 2017-05-01 DIAGNOSIS — Z79899 Other long term (current) drug therapy: Secondary | ICD-10-CM | POA: Insufficient documentation

## 2017-05-01 DIAGNOSIS — Z7984 Long term (current) use of oral hypoglycemic drugs: Secondary | ICD-10-CM | POA: Insufficient documentation

## 2017-05-01 DIAGNOSIS — Z7982 Long term (current) use of aspirin: Secondary | ICD-10-CM | POA: Insufficient documentation

## 2017-05-01 DIAGNOSIS — E119 Type 2 diabetes mellitus without complications: Secondary | ICD-10-CM | POA: Insufficient documentation

## 2017-05-01 DIAGNOSIS — R42 Dizziness and giddiness: Secondary | ICD-10-CM | POA: Insufficient documentation

## 2017-05-01 DIAGNOSIS — I10 Essential (primary) hypertension: Secondary | ICD-10-CM | POA: Insufficient documentation

## 2017-05-01 LAB — BASIC METABOLIC PANEL
Anion gap: 10 (ref 5–15)
BUN: 20 mg/dL (ref 6–20)
CHLORIDE: 101 mmol/L (ref 101–111)
CO2: 25 mmol/L (ref 22–32)
CREATININE: 1.11 mg/dL — AB (ref 0.44–1.00)
Calcium: 9.1 mg/dL (ref 8.9–10.3)
GFR calc Af Amer: 57 mL/min — ABNORMAL LOW (ref >60)
GFR calc non Af Amer: 49 mL/min — ABNORMAL LOW (ref >60)
GLUCOSE: 320 mg/dL — AB (ref 65–99)
POTASSIUM: 3.5 mmol/L (ref 3.5–5.1)
Sodium: 136 mmol/L (ref 135–145)

## 2017-05-01 LAB — CBC WITH DIFFERENTIAL/PLATELET
Basophils Absolute: 0 10*3/uL (ref 0.0–0.1)
Basophils Relative: 0 %
EOS ABS: 0.3 10*3/uL (ref 0.0–0.7)
Eosinophils Relative: 3 %
HEMATOCRIT: 33.8 % — AB (ref 36.0–46.0)
HEMOGLOBIN: 11.7 g/dL — AB (ref 12.0–15.0)
LYMPHS ABS: 2 10*3/uL (ref 0.7–4.0)
LYMPHS PCT: 25 %
MCH: 29.3 pg (ref 26.0–34.0)
MCHC: 34.6 g/dL (ref 30.0–36.0)
MCV: 84.7 fL (ref 78.0–100.0)
MONOS PCT: 6 %
Monocytes Absolute: 0.5 10*3/uL (ref 0.1–1.0)
NEUTROS PCT: 66 %
Neutro Abs: 5.4 10*3/uL (ref 1.7–7.7)
Platelets: 336 10*3/uL (ref 150–400)
RBC: 3.99 MIL/uL (ref 3.87–5.11)
RDW: 13.2 % (ref 11.5–15.5)
WBC: 8.2 10*3/uL (ref 4.0–10.5)

## 2017-05-01 MED ORDER — SODIUM CHLORIDE 0.9 % IV BOLUS (SEPSIS)
1000.0000 mL | Freq: Once | INTRAVENOUS | Status: AC
  Administered 2017-05-01: 1000 mL via INTRAVENOUS

## 2017-05-01 NOTE — ED Notes (Signed)
Pt states she had a HA on Saturday, HA resolved upon waking up yesterday

## 2017-05-01 NOTE — ED Triage Notes (Signed)
Pt reports feeling "off balance" since last Wednesday and headache since Saturday.  Denies hitting head.  Pt says she fell in her bedroom Friday but landed on her buttocks.

## 2017-05-01 NOTE — ED Provider Notes (Signed)
[facility] EMERGENCY DEPARTMENT Provider Note   CSN: [id number] Arrival date & time: 05/01/17  1039     History   Chief Complaint Chief Complaint  Patient presents with  . Dizziness  . Headache    HPI Maria Parrish is a 71 y.o. female.   Dizziness  Quality:  Imbalance Severity:  Mild Onset quality:  Gradual Duration:  3 days Timing:  Constant Progression:  Unchanged Chronicity:  New Context: bending over (worse with) and standing up (worse with)   Context: not with head movement   Relieved by:  None tried Associated symptoms: headaches   Associated symptoms: no blood in stool, no nausea and no shortness of breath   Headache   Pertinent negatives include no shortness of breath and no nausea.    Past Medical History:  Diagnosis Date  . Assistance needed for mobility    walks with walker  . Confusion   . Dementia   . Diabetes mellitus without complication ([place])   . Generalized weakness   . Hypercalcemia   . Hyperlipidemia   . Hyperparathyroidism ([place])   . Hypertension   . Hypertension   . Moderate protein-calorie malnutrition ([place])   . Peripheral neuropathy   . UTI (lower urinary tract infection)     Patient Active Problem List   Diagnosis Date Noted  . Other hyperparathyroidism ([place]) 10/19/2016  . Vitamin D deficiency 01/12/2016  . Altered mental status 09/22/2015  . Left hemiparesis ([place]) 09/22/2015  . Acute encephalopathy 09/22/2015  . Palliative care encounter   . UTI (urinary tract infection) 09/14/2015  . Dementia 09/14/2015  . General weakness 09/14/2015  . Subacute confusional state 09/14/2015  . Uncontrolled type 2 diabetes mellitus with complication, without long-term current use of insulin ([place]) 09/14/2015  . Confusion 09/14/2015  . Fall at home   . Elevated parathyroid hormone 09/09/2015  . Mucosal abnormality of stomach   . Diverticulosis of colon without hemorrhage   . Hypercalcemia 08/10/2015  . Goiter 08/10/2015  . Abnormal  weight loss 07/28/2015  . Neuropathy 03/26/2015  . Hypokalemia 03/26/2015  . Essential hypertension 03/26/2015  . Hyperlipidemia 03/26/2015  . Malnutrition of moderate degree ([place]) 03/26/2015    Past Surgical History:  Procedure Laterality Date  . COLONOSCOPY N/A 08/12/2015   Procedure: COLONOSCOPY;  Surgeon: Daneil Dolin, MD;  Location[facility];  Service: Endoscopy;  Laterality: N/A;  1245pm  . ESOPHAGOGASTRODUODENOSCOPY N/A 08/12/2015   Procedure: ESOPHAGOGASTRODUODENOSCOPY (EGD);  Surgeon: Daneil Dolin, MD;  Location[facility];  Service: Endoscopy;  Laterality: N/A;  . none      OB History    No data available       Home Medications    Prior to Admission medications   Medication Sig Start Date End Date Taking? Authorizing Provider  aspirin 325 MG tablet Take 1 tablet (325 mg total) by mouth daily. 09/24/15  Yes Rama, Venetia Maxon, MD  Cyanocobalamin (VITAMIN B-12 PO) Take 1 tablet by mouth daily.   Yes [provider]  donepezil (ARICEPT) 10 MG tablet Take 1 tablet by mouth daily. 02/01/17  Yes [provider]  folic acid (FOLVITE) 1 MG tablet Take 1 tablet (1 mg total) by mouth daily. 09/19/15  Yes Nita Sells, MD  glipiZIDE (GLUCOTROL) 10 MG tablet TAKE ONE TABLET BY MOUTH ONCE DAILY BEFORE BREAKFAST 02/23/16  Yes Nida, Marella Chimes, MD  lisinopril (PRINIVIL,ZESTRIL) 20 MG tablet Take 1 tablet (20 mg total) by mouth daily. Patient taking differently: Take  20 mg by mouth every evening.  03/27/15  Yes Kathie Dike, MD  lisinopril-hydrochlorothiazide (PRINZIDE,ZESTORETIC) 20-12.5 MG tablet Take 1 tablet by mouth daily. Takes in the morning   Yes [provider]  metFORMIN (GLUCOPHAGE) 500 MG tablet Take 1 tablet (500 mg total) by mouth 2 (two) times daily with a meal. 07/19/16  Yes Nida, Marella Chimes, MD  polyethylene glycol (MIRALAX / GLYCOLAX) packet Take 17 g by mouth daily as needed for mild constipation. 09/19/15  Yes Nita Sells, MD  Vitamin D, Ergocalciferol, (DRISDOL) 50000 units CAPS capsule Take 1 capsule (50,000 Units total) by mouth every 14 (fourteen) days. 02/26/17  Yes Cassandria Anger, MD    Family History Family History  Problem Relation Age of Onset  . Hypertension Brother   . Hypertension Brother   . Diabetes Brother   . Diabetes Father   . Heart attack Mother   . Colon cancer Neg Hx     Social History Social History  Substance Use Topics  . Smoking status: Never Smoker  . Smokeless tobacco: Never Used  . Alcohol use No     Allergies   Penicillins and Statins   Review of Systems Review of Systems  Respiratory: Negative for shortness of breath.   Gastrointestinal: Negative for blood in stool and nausea.  Neurological: Positive for dizziness and headaches.  All other systems reviewed and are negative.    Physical Exam Updated Vital Signs BP (!) 186/89   Pulse (!) 59   Temp (!) 97.5 F (36.4 C) (Oral)   Resp 10   Ht 5\' 3"  (1.6 m)   Wt 64 kg (141 lb)   SpO2 100%   BMI 24.98 kg/m   Physical Exam  Constitutional: She is oriented to person, place, and time. She appears well-developed and well-nourished.  HENT:  Head: Normocephalic and atraumatic.  Eyes: Conjunctivae and EOM are normal.  Neck: Normal range of motion.  Cardiovascular: Normal rate and regular rhythm.  Exam reveals no gallop and no friction rub.   No murmur heard. Pulmonary/Chest: Effort normal and breath sounds normal. No stridor. No respiratory distress. She has no wheezes.  Abdominal: Soft. She exhibits no distension. There is no tenderness.  Neurological: She is alert and oriented to person, place, and time. No cranial nerve deficit. Coordination normal.  No altered mental status, able to give full seemingly accurate history.  Face is symmetric, EOM's intact, pupils equal and reactive, vision intact, tongue and uvula midline without deviation. Upper and Lower extremity motor 5/5, intact  pain perception in distal extremities, 2+ reflexes in biceps, patella and achilles tendons. Able to perform finger to nose normal with both hands. Walks without assistance or evident ataxia.   Skin: Skin is warm and dry.  Nursing note and vitals reviewed.    ED Treatments / Results  Labs (all labs ordered are listed, but only abnormal results are displayed) Labs Reviewed  CBC WITH DIFFERENTIAL/PLATELET - Abnormal; Notable for the following:       Result Value   Hemoglobin 11.7 (*)    HCT 33.8 (*)    All other components within normal limits  BASIC METABOLIC PANEL - Abnormal; Notable for the following:    Glucose, Bld 320 (*)    Creatinine, Ser 1.11 (*)    GFR calc non Af Amer 49 (*)    GFR calc Af Amer 57 (*)    All other components within normal limits    EKG  EKG Interpretation  Date/Time:  Tuesday May 01 2017 10:56:26 EDT Ventricular Rate:  71 PR Interval:    QRS Duration: 68 QT Interval:  634 QTC Calculation: 690 R Axis:   -19 Text Interpretation:  Sinus rhythm Borderline left axis deviation Low voltage, precordial leads Abnormal R-wave progression, early transition Nonspecific T abnormalities, lateral leads Prolonged QT interval No significant change since last tracing Confirmed by Merrily Pew [phone]) on 05/01/2017 11:01:28 AM       Radiology Dg Chest 2 View  Result Date: 05/01/2017 CLINICAL DATA:  Dizziness EXAM: CHEST  2 VIEW COMPARISON:  09/22/2015 FINDINGS: Lungs are clear.  No pleural effusion or pneumothorax. The heart is normal in size. Visualized osseous structures are within normal limits. IMPRESSION: Normal chest radiographs. Electronically Signed   By: Julian Hy M.D.   On: 05/01/2017 12:26   Ct Head Wo Contrast  Result Date: 05/01/2017 CLINICAL DATA:  Headaches since Saturday. Balance issues since Wednesday. Diabetes. Hypertension. Hyperlipidemia. EXAM: CT HEAD WITHOUT CONTRAST TECHNIQUE: Contiguous axial images were obtained from the base  of the skull through the vertex without intravenous contrast. COMPARISON:  09/22/2015 FINDINGS: Brain: Moderate periventricular white matter hypoattenuation. Mild cerebral and cerebellar atrophy. Similar mild ventriculomegaly. No mass lesion, acute infarct, intra-axial, or extra-axial fluid collection. Vascular: Intracranial atherosclerosis. Skull: No significant soft tissue swelling.  No skull fracture. Sinuses/Orbits: Normal imaged portions of the orbits and globes. Minimal ethmoid air cell mucosal thickening. Clear mastoid air cells. Other: None. IMPRESSION: 1.  No acute intracranial abnormality. 2.  Cerebral atrophy and small vessel ischemic change. 3. Ventriculomegaly which could relate to cerebral atrophy or normal pressure hydrocephalus. This is not significantly changed. Electronically Signed   By: Abigail Miyamoto M.D.   On: 05/01/2017 12:51    Procedures Procedures (including critical care time)  Medications Ordered in ED Medications  sodium chloride 0.9 % bolus 1,000 mL (1,000 mLs Intravenous New Bag/Given 05/01/17 1205)     Initial Impression / Assessment and Plan / ED Course  I have reviewed the triage vital signs and the nursing notes.  Pertinent labs & imaging results that were available during my care of the patient were reviewed by me and considered in my medical decision making (see chart for details).     Likely related to hypovolemia. Ambulated in ED multiple times without dizziness, syncope, unsteady gait or ataxia. BP still elevated but doubt relation to problems. Will continue following with PCP for further modifications of BP meds.   Final Clinical Impressions(s) / ED Diagnoses   Final diagnoses:  Dizziness    New Prescriptions New Prescriptions   No medications on file     Midge Momon, Corene Cornea, MD 05/01/17 1506

## 2017-05-01 NOTE — ED Notes (Signed)
Pt had steady gate. No complaints of dizziness.

## 2017-05-22 LAB — PTH, INTACT AND CALCIUM
Calcium: 9.5 mg/dL (ref 8.6–10.4)
PTH: 107 pg/mL — ABNORMAL HIGH (ref 14–64)

## 2017-05-22 LAB — RENAL FUNCTION PANEL
Albumin: 3.7 g/dL (ref 3.6–5.1)
BUN/Creatinine Ratio: 17 (calc) (ref 6–22)
BUN: 18 mg/dL (ref 7–25)
CO2: 29 mmol/L (ref 20–32)
Calcium: 9.5 mg/dL (ref 8.6–10.4)
Chloride: 99 mmol/L (ref 98–110)
Creat: 1.08 mg/dL — ABNORMAL HIGH (ref 0.60–0.93)
Glucose, Bld: 114 mg/dL (ref 65–139)
Phosphorus: 3.2 mg/dL (ref 2.1–4.3)
Potassium: 3.6 mmol/L (ref 3.5–5.3)
Sodium: 136 mmol/L (ref 135–146)

## 2017-05-22 LAB — HEMOGLOBIN A1C
HEMOGLOBIN A1C: 7.9 %{Hb} — AB (ref <5.7)
MEAN PLASMA GLUCOSE: 180 (calc)
eAG (mmol/L): 10 (calc)

## 2017-05-30 ENCOUNTER — Encounter: Payer: Self-pay | Admitting: "Endocrinology

## 2017-05-30 ENCOUNTER — Ambulatory Visit: Payer: Medicare HMO | Admitting: "Endocrinology

## 2017-05-30 VITALS — BP 143/83 | HR 70 | Ht 65.0 in | Wt 148.0 lb

## 2017-05-30 DIAGNOSIS — E212 Other hyperparathyroidism: Secondary | ICD-10-CM

## 2017-05-30 DIAGNOSIS — E1165 Type 2 diabetes mellitus with hyperglycemia: Secondary | ICD-10-CM

## 2017-05-30 DIAGNOSIS — E118 Type 2 diabetes mellitus with unspecified complications: Secondary | ICD-10-CM | POA: Diagnosis not present

## 2017-05-30 DIAGNOSIS — IMO0002 Reserved for concepts with insufficient information to code with codable children: Secondary | ICD-10-CM

## 2017-05-30 DIAGNOSIS — E559 Vitamin D deficiency, unspecified: Secondary | ICD-10-CM | POA: Diagnosis not present

## 2017-05-30 MED ORDER — GLIPIZIDE 10 MG PO TABS: ORAL_TABLET | ORAL | 2 refills | Status: DC

## 2017-05-30 NOTE — Progress Notes (Signed)
Subjective:    Patient ID: Maria Parrish, female    DOB: 05-18-1946, PCP Vidal Schwalbe, MD   Past Medical History:  Diagnosis Date  . Assistance needed for mobility    walks with walker  . Confusion   . Dementia   . Diabetes mellitus without complication ([place])   . Generalized weakness   . Hypercalcemia   . Hyperlipidemia   . Hyperparathyroidism ([place])   . Hypertension   . Hypertension   . Moderate protein-calorie malnutrition ([place])   . Peripheral neuropathy   . UTI (lower urinary tract infection)    Past Surgical History:  Procedure Laterality Date  . none     Social History   Socioeconomic History  . Marital status: Widowed    Spouse name: None  . Number of children: 2  . Years of education: None  . Highest education level: None  Social Needs  . Financial resource strain: None  . Food insecurity - worry: None  . Food insecurity - inability: None  . Transportation needs - medical: None  . Transportation needs - non-medical: None  Occupational History  . Occupation: Nutritional therapist for [employer]  . Smoking status: Never Smoker  . Smokeless tobacco: Never Used  Substance and Sexual Activity  . Alcohol use: No  . Drug use: No  . Sexual activity: Not Currently  Other Topics Concern  . None  Social History Narrative  . None   Outpatient Encounter Medications as of 05/30/2017  Medication Sig  . aspirin 325 MG tablet Take 1 tablet (325 mg total) by mouth daily.  . Cyanocobalamin (VITAMIN B-12 PO) Take 1 tablet by mouth daily.  Marland Kitchen donepezil (ARICEPT) 10 MG tablet Take 1 tablet by mouth daily.  . folic acid (FOLVITE) 1 MG tablet Take 1 tablet (1 mg total) by mouth daily.  Marland Kitchen glipiZIDE (GLUCOTROL) 10 MG tablet TAKE ONE TABLET BY MOUTH ONCE DAILY WITH BREAKFAST  . lisinopril-hydrochlorothiazide (PRINZIDE,ZESTORETIC) 20-12.5 MG tablet Take 1 tablet by mouth daily. Takes in the morning  . metFORMIN (GLUCOPHAGE) 500 MG tablet Take 1 tablet (500 mg total) by mouth 2  (two) times daily with a meal.  . polyethylene glycol (MIRALAX / GLYCOLAX) packet Take 17 g by mouth daily as needed for mild constipation.  . Vitamin D, Ergocalciferol, (DRISDOL) 50000 units CAPS capsule Take 1 capsule (50,000 Units total) by mouth every 14 (fourteen) days.  . [DISCONTINUED] glipiZIDE (GLUCOTROL) 10 MG tablet TAKE ONE TABLET BY MOUTH ONCE DAILY BEFORE BREAKFAST  . [DISCONTINUED] lisinopril (PRINIVIL,ZESTRIL) 20 MG tablet Take 1 tablet (20 mg total) by mouth daily. (Patient taking differently: Take 20 mg by mouth every evening. )   No facility-administered encounter medications on file as of 05/30/2017.    ALLERGIES: Allergies  Allergen Reactions  . Penicillins Other (See Comments)    Makes patient sore.  Has patient had a PCN reaction causing immediate rash, facial/tongue/throat swelling, SOB or lightheadedness with hypotension: No Has patient had a PCN reaction causing severe rash involving mucus membranes or skin necrosis: No Has patient had a PCN reaction that required hospitalization No Has patient had a PCN reaction occurring within the last 10 years: No If all of the above answers are "NO", then may proceed with Cephalosporin use.   . Statins Other (See Comments)    Makes wrists and fingers ache   VACCINATION STATUS:  There is no immunization history on file for this patient.  HPI 71 year old female patient with medical history  as above. She is here to follow-up after being seen for history of hyperparathyroidism with recent elevation of PTH associated with mild hypercalcemia.  - Her repeat labs show, improving PTH of 107, along with controlled calcium 9.5.   She has steady body weight.  - Her thyroid ultrasound shows mild goiter with no discrete nodules. - No history of diagnosed cancer. - Her  recent A1c is  7.9% improving from 9.4%. She remains on glipizide 10 mg by mouth once a day and metformin 500 mg by mouth once a day. She did not bring any meter  nor logs to review. - She has no new complaints.  Review of Systems Constitutional:  He has a steady weight, since last visit , +fatigue, no subjective hyperthermia/hypothermia Eyes: no blurry vision, no xerophthalmia ENT: no sore throat, no nodules palpated in throat, no dysphagia/odynophagia, no hoarseness Cardiovascular: no CP/SOB/palpitations/leg swelling Respiratory: no cough/SOB Gastrointestinal: no N/V/D/C Musculoskeletal: no muscle/joint aches Skin: no rashes Neurological: no tremors/numbness/tingling,  +dizziness Psychiatric: no depression/anxiety   Objective:    BP (!) 143/83   Pulse 70   Ht _0  (1.651 m)   Wt 148 lb (67.1 kg)   BMI 24.63 kg/m   Wt Readings from Last 3 Encounters:  05/30/17 148 lb (67.1 kg)  05/01/17 141 lb (64 kg)  03/02/17 153 lb (69.4 kg)    Physical Exam Constitutional:  Appropriate weight for height, in NAD, she uses a cane to walk around. Eyes: PERRLA, EOMI, no exophthalmos ENT: moist mucous membranes. Neck: She has prominent sternocleidomastoid joints bilaterally, she has mild thyromegaly,  No cervical lymphadenopathy- unchanged from last exam. Cardiovascular: RRR, No MRG Respiratory: CTA B Gastrointestinal: abdomen soft, NT, ND, BS+ Musculoskeletal: no deformities, strength intact in all 4 Skin: moist, warm, no rashes Neurological: no tremor with outstretched hands, DTR normal in all 4  Recent Results (from the past 2160 hour(s))  CBC with Differential     Status: Abnormal   Collection Time: 05/01/17 11:34 AM  Result Value Ref Range   WBC 8.2 4.0 - 10.5 K/uL   RBC 3.99 3.87 - 5.11 MIL/uL   Hemoglobin 11.7 (L) 12.0 - 15.0 g/dL   HCT 33.8 (L) 36.0 - 46.0 %   MCV 84.7 78.0 - 100.0 fL   MCH 29.3 26.0 - 34.0 pg   MCHC 34.6 30.0 - 36.0 g/dL   RDW 13.2 11.5 - 15.5 %   Platelets 336 150 - 400 K/uL   Neutrophils Relative % 66 %   Neutro Abs 5.4 1.7 - 7.7 K/uL   Lymphocytes Relative 25 %   Lymphs Abs 2.0 0.7 - 4.0 K/uL   Monocytes  Relative 6 %   Monocytes Absolute 0.5 0.1 - 1.0 K/uL   Eosinophils Relative 3 %   Eosinophils Absolute 0.3 0.0 - 0.7 K/uL   Basophils Relative 0 %   Basophils Absolute 0.0 0.0 - 0.1 K/uL  Basic metabolic panel     Status: Abnormal   Collection Time: 05/01/17 11:34 AM  Result Value Ref Range   Sodium 136 135 - 145 mmol/L   Potassium 3.5 3.5 - 5.1 mmol/L   Chloride 101 101 - 111 mmol/L   CO2 25 22 - 32 mmol/L   Glucose, Bld 320 (H) 65 - 99 mg/dL   BUN 20 6 - 20 mg/dL   Creatinine, Ser 1.11 (H) 0.44 - 1.00 mg/dL   Calcium 9.1 8.9 - 10.3 mg/dL   GFR calc non Af Amer 49 (L) >60 mL/min  GFR calc Af Amer 57 (L) >60 mL/min    Comment: (NOTE) The eGFR has been calculated using the CKD EPI equation. This calculation has not been validated in all clinical situations. eGFR's persistently <60 mL/min signify possible Chronic Kidney Disease.    Anion gap 10 5 - 15  Renal function panel     Status: Abnormal   Collection Time: 05/21/17  4:09 PM  Result Value Ref Range   Glucose, Bld 114 65 - 139 mg/dL    Comment: .        Non-fasting reference interval .    BUN 18 7 - 25 mg/dL   Creat 1.08 (H) 0.60 - 0.93 mg/dL    Comment: For patients >52 years of age, the reference limit for Creatinine is approximately 13% higher for people identified as African-American. .    BUN/Creatinine Ratio 17 6 - 22 (calc)   Sodium 136 135 - 146 mmol/L   Potassium 3.6 3.5 - 5.3 mmol/L   Chloride 99 98 - 110 mmol/L   CO2 29 20 - 32 mmol/L   Calcium 9.5 8.6 - 10.4 mg/dL   Phosphorus 3.2 2.1 - 4.3 mg/dL   Albumin 3.7 3.6 - 5.1 g/dL  Hemoglobin A1c     Status: Abnormal   Collection Time: 05/21/17  4:09 PM  Result Value Ref Range   Hgb A1c MFr Bld 7.9 (H) <5.7 % of total Hgb    Comment: For someone without known diabetes, a hemoglobin A1c value of 6.5% or greater indicates that they may have  diabetes and this should be confirmed with a follow-up  test. . For someone with known diabetes, a value <7%  indicates  that their diabetes is well controlled and a value  greater than or equal to 7% indicates suboptimal  control. A1c targets should be individualized based on  duration of diabetes, age, comorbid conditions, and  other considerations. . Currently, no consensus exists regarding use of hemoglobin A1c for diagnosis of diabetes for children. .    Mean Plasma Glucose 180 (calc)   eAG (mmol/L) 10.0 (calc)  PTH, intact and calcium     Status: Abnormal   Collection Time: 05/21/17  4:09 PM  Result Value Ref Range   PTH 107 (H) 14 - 64 pg/mL    Comment: . Interpretive Guide    Intact PTH           Calcium ------------------    ----------           ------- Normal Parathyroid    Normal               Normal Hypoparathyroidism    Low or Low Normal    Low Hyperparathyroidism    Primary            Normal or High       High    Secondary          High                 Normal or Low    Tertiary           High                 High Non-Parathyroid    Hypercalcemia      Low or Low Normal    High .    Calcium 9.5 8.6 - 10.4 mg/dL    Diabetic Labs (most recent): Lab Results  Component Value Date   HGBA1C 7.9 (H)  05/21/2017   HGBA1C 9.4 (H) 01/29/2017   HGBA1C 9.7 (H) 09/29/2016    Assessment & Plan:   1. Hypercalcemia: Resolved  - Her repeat labs show normal calcium of 9.5, However her PTH remains high at 107, although improving from 114. 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 - She is vitamin D replete now . -She will not need any intervention for hypercalcemia at this time. - Her neck/thyroid ultrasound was unremarkable.   2. Uncontrolled type 2 diabetes mellitus without complication, without long-term current use of insulin (HCC) - Her A1c is  improving to 7.9% from 9.7%.  - She will not need more therapy.  she promises to continue to  do better on her diet.   I advised her to continue metformin 500 mg by mouth twice a day, glipizide 10 mg by mouth  with breakfast. She agrees to continue to monitor blood glucose 2 times a day-before breakfast and at bedtime and return with her meter and logs during her next visit. She will be reapproached for  possible initiation of basal insulin if A1c remains above 9%.   3. Essential hypertension -Uncontrolled I advised her to continue lisinopril 30 mg by mouth every morning for now. She is hesitant to add any additional therapy, but willing to discuss this with her PMD.   4. Vitamin D deficiency: See #1 above. I will initiate vitamin D 50,000 units every 2 weeks for the next 16 weeks.  5. Goiter -Her thyroid ultrasound showed mild goiter with no discrete nodules. No intervention needed at this point.   - I advised patient to maintain close follow up with Vidal Schwalbe, MD for primary care needs. Follow up plan: Return in about 6 months (around 11/27/2017) for follow up with pre-visit labs.  Glade Lloyd, MD Phone: [phone]  Fax: [phone]  -  This note was partially dictated with voice recognition software. Similar sounding words can be transcribed inadequately or may not  be corrected upon review.  05/30/2017, 4:25 PM

## 2017-11-27 ENCOUNTER — Ambulatory Visit: Payer: Medicare HMO | Admitting: "Endocrinology

## 2017-12-06 ENCOUNTER — Other Ambulatory Visit: Payer: Self-pay | Admitting: "Endocrinology

## 2017-12-06 DIAGNOSIS — E213 Hyperparathyroidism, unspecified: Secondary | ICD-10-CM

## 2017-12-06 DIAGNOSIS — E559 Vitamin D deficiency, unspecified: Secondary | ICD-10-CM

## 2017-12-06 DIAGNOSIS — R739 Hyperglycemia, unspecified: Secondary | ICD-10-CM

## 2017-12-07 LAB — COMPREHENSIVE METABOLIC PANEL
AG Ratio: 1.2 (calc) (ref 1.0–2.5)
ALKALINE PHOSPHATASE (APISO): 62 U/L (ref 33–130)
ALT: 12 U/L (ref 6–29)
AST: 14 U/L (ref 10–35)
Albumin: 3.8 g/dL (ref 3.6–5.1)
BUN / CREAT RATIO: 14 (calc) (ref 6–22)
BUN: 14 mg/dL (ref 7–25)
CHLORIDE: 99 mmol/L (ref 98–110)
CO2: 31 mmol/L (ref 20–32)
CREATININE: 1.01 mg/dL — AB (ref 0.60–0.93)
Calcium: 9.9 mg/dL (ref 8.6–10.4)
GLOBULIN: 3.1 g/dL (ref 1.9–3.7)
GLUCOSE: 182 mg/dL — AB (ref 65–99)
POTASSIUM: 3.6 mmol/L (ref 3.5–5.3)
Sodium: 136 mmol/L (ref 135–146)
Total Bilirubin: 0.5 mg/dL (ref 0.2–1.2)
Total Protein: 6.9 g/dL (ref 6.1–8.1)

## 2017-12-07 LAB — VITAMIN D 25 HYDROXY (VIT D DEFICIENCY, FRACTURES): VIT D 25 HYDROXY: 28 ng/mL — AB (ref 30–100)

## 2017-12-07 LAB — PTH, INTACT AND CALCIUM
CALCIUM: 9.9 mg/dL (ref 8.6–10.4)
PTH: 110 pg/mL — ABNORMAL HIGH (ref 14–64)

## 2017-12-07 LAB — HEMOGLOBIN A1C
EAG (MMOL/L): 13.8 (calc)
HEMOGLOBIN A1C: 10.3 %{Hb} — AB (ref <5.7)
MEAN PLASMA GLUCOSE: 249 (calc)

## 2017-12-17 ENCOUNTER — Ambulatory Visit: Payer: Medicare HMO | Admitting: "Endocrinology

## 2017-12-17 ENCOUNTER — Encounter: Payer: Self-pay | Admitting: "Endocrinology

## 2017-12-17 VITALS — BP 148/77 | HR 71 | Ht 65.0 in | Wt 149.0 lb

## 2017-12-17 DIAGNOSIS — Z9119 Patient's noncompliance with other medical treatment and regimen: Secondary | ICD-10-CM | POA: Diagnosis not present

## 2017-12-17 DIAGNOSIS — E118 Type 2 diabetes mellitus with unspecified complications: Secondary | ICD-10-CM

## 2017-12-17 DIAGNOSIS — IMO0002 Reserved for concepts with insufficient information to code with codable children: Secondary | ICD-10-CM

## 2017-12-17 DIAGNOSIS — E1165 Type 2 diabetes mellitus with hyperglycemia: Secondary | ICD-10-CM | POA: Diagnosis not present

## 2017-12-17 DIAGNOSIS — E213 Hyperparathyroidism, unspecified: Secondary | ICD-10-CM

## 2017-12-17 DIAGNOSIS — Z91199 Patient's noncompliance with other medical treatment and regimen due to unspecified reason: Secondary | ICD-10-CM | POA: Insufficient documentation

## 2017-12-17 NOTE — Patient Instructions (Signed)

## 2017-12-17 NOTE — Progress Notes (Signed)
Subjective:    Patient ID: Maria Parrish, female    DOB: 04/09/1946, PCP Vidal Schwalbe, MD   Past Medical History:  Diagnosis Date  . Assistance needed for mobility    walks with walker  . Confusion   . Dementia   . Diabetes mellitus without complication ([place])   . Generalized weakness   . Hypercalcemia   . Hyperlipidemia   . Hyperparathyroidism ([place])   . Hypertension   . Hypertension   . Moderate protein-calorie malnutrition ([place])   . Peripheral neuropathy   . UTI (lower urinary tract infection)    Past Surgical History:  Procedure Laterality Date  . COLONOSCOPY N/A 08/12/2015   Procedure: COLONOSCOPY;  Surgeon: Daneil Dolin, MD;  Location: [facility];  Service: Endoscopy;  Laterality: N/A;  1245pm  . ESOPHAGOGASTRODUODENOSCOPY N/A 08/12/2015   Procedure: ESOPHAGOGASTRODUODENOSCOPY (EGD);  Surgeon: Daneil Dolin, MD;  Location: [facility];  Service: Endoscopy;  Laterality: N/A;  . none     Social History   Socioeconomic History  . Marital status: Widowed    Spouse name: Not on file  . Number of children: 2  . Years of education: Not on file  . Highest education level: Not on file  Occupational History  . Occupation: Nutritional therapist for [employer]  . Financial resource strain: Not on file  . Food insecurity:    Worry: Not on file    Inability: Not on file  . Transportation needs:    Medical: Not on file    Non-medical: Not on file  Tobacco Use  . Smoking status: Never Smoker  . Smokeless tobacco: Never Used  Substance and Sexual Activity  . Alcohol use: No  . Drug use: No  . Sexual activity: Not Currently  Lifestyle  . Physical activity:    Days per week: Not on file    Minutes per session: Not on file  . Stress: Not on file  Relationships  . Social connections:    Talks on phone: Not on file    Gets together: Not on file    Attends religious service: Not on file    Active member of club or organization: Not on file    Attends meetings of  clubs or organizations: Not on file    Relationship status: Not on file  Other Topics Concern  . Not on file  Social History Narrative  . Not on file   Outpatient Encounter Medications as of 12/17/2017  Medication Sig  . aspirin 325 MG tablet Take 1 tablet (325 mg total) by mouth daily.  . Cyanocobalamin (VITAMIN B-12 PO) Take 1 tablet by mouth daily.  Marland Kitchen donepezil (ARICEPT) 10 MG tablet Take 1 tablet by mouth daily.  . folic acid (FOLVITE) 1 MG tablet Take 1 tablet (1 mg total) by mouth daily.  Marland Kitchen glipiZIDE (GLUCOTROL) 10 MG tablet TAKE ONE TABLET BY MOUTH ONCE DAILY WITH BREAKFAST  . lisinopril-hydrochlorothiazide (PRINZIDE,ZESTORETIC) 20-12.5 MG tablet Take 1 tablet by mouth daily. Takes in the morning  . metFORMIN (GLUCOPHAGE) 500 MG tablet Take 1 tablet (500 mg total) by mouth 2 (two) times daily with a meal.  . polyethylene glycol (MIRALAX / GLYCOLAX) packet Take 17 g by mouth daily as needed for mild constipation.  . Vitamin D, Ergocalciferol, (DRISDOL) 50000 units CAPS capsule Take 1 capsule (50,000 Units total) by mouth every 14 (fourteen) days.   No facility-administered encounter medications on file as of 12/17/2017.    ALLERGIES: Allergies  Allergen Reactions  . Penicillins Other (See Comments)    Makes patient sore.  Has patient had a PCN reaction causing immediate rash, facial/tongue/throat swelling, SOB or lightheadedness with hypotension: No Has patient had a PCN reaction causing severe rash involving mucus membranes or skin necrosis: No Has patient had a PCN reaction that required hospitalization No Has patient had a PCN reaction occurring within the last 10 years: No If all of the above answers are "NO", then may proceed with Cephalosporin use.   . Statins Other (See Comments)    Makes wrists and fingers ache   VACCINATION STATUS:  There is no immunization history on file for this patient.  HPI 72 year old female patient with medical history as above. She is here  to follow-up after being seen for history of hyperparathyroidism with recent elevation of PTH associated with mild hypercalcemia.  - Her repeat previsit labs show stable calcium at 9.9, PTH remains stable at 110.   She has steady body weight.  - Her thyroid ultrasound shows mild goiter with no discrete nodules. - No history of diagnosed cancer. -She also has chronically uncontrolled type 2 diabetes with her recent labs showing A1c getting worse at 10.3%.  She was offered insulin treatment when A1c was 9.4% however she declined.    She remains on glipizide 10 mg by mouth once a day and metformin 500 mg by mouth once a day. She did not bring any meter nor logs to review. - She has no new complaints.  Review of Systems Constitutional:  + Progressive weight gain,  +fatigue, no subjective hyperthermia/hypothermia Eyes: no blurry vision, no xerophthalmia ENT: no sore throat, no nodules palpated in throat, no dysphagia/odynophagia, no hoarseness Cardiovascular: no chest pain, no palpitations.   Respiratory: no cough/SOB Gastrointestinal: no N/V/D/C Musculoskeletal: no muscle/joint aches Skin: no rashes Neurological: no tremors/numbness/tingling,  +dizziness Psychiatric: no depression/anxiety   Objective:    BP (!) 148/77   Pulse 71   Ht 5\' 5"  (1.651 m)   Wt 149 lb (67.6 kg)   BMI 24.79 kg/m   Wt Readings from Last 3 Encounters:  12/17/17 149 lb (67.6 kg)  05/30/17 148 lb (67.1 kg)  05/01/17 141 lb (64 kg)    Physical Exam Constitutional:  Appropriate weight for height, in NAD, she uses a cane to walk around. Eyes: PERRLA, EOMI, no exophthalmos ENT: moist mucous membranes. Neck: She has prominent sternocleidomastoid joints bilaterally, she has mild thyromegaly,  No cervical lymphadenopathy- unchanged from last exam.  Musculoskeletal: no deformities, strength intact in all 4 Skin: moist, warm, no rashes Neurological: no tremor with outstretched hands  Recent Results (from the  past 2160 hour(s))  Vitamin D, 25-hydroxy     Status: Abnormal   Collection Time: 12/06/17 11:01 AM  Result Value Ref Range   Vit D, 25-Hydroxy 28 (L) 30 - 100 ng/mL    Comment: Vitamin D Status         25-OH Vitamin D: . Deficiency:                    <20 ng/mL Insufficiency:             20 - 29 ng/mL Optimal:                 > or = 30 ng/mL . For 25-OH Vitamin D testing on patients on  D2-supplementation and patients for whom quantitation  of D2 and D3 fractions is required, the Quest[employer]ureD(TM) 25-OH VIT D, (D2,D3), LC/MS/MS  is recommended: order  code [id number] (patients >41yrs). . For more information on this test, go to: http://education.questdiagnostics.com/faq/FAQ163 (This link is being provided for  informational/educational purposes only.)   Comprehensive metabolic panel     Status: Abnormal   Collection Time: 12/06/17 11:01 AM  Result Value Ref Range   Glucose, Bld 182 (H) 65 - 99 mg/dL    Comment: .            Fasting reference interval . For someone without known diabetes, a glucose value >125 mg/dL indicates that they may have diabetes and this should be confirmed with a follow-up test. .    BUN 14 7 - 25 mg/dL   Creat 1.01 (H) 0.60 - 0.93 mg/dL    Comment: For patients >69 years of age, the reference limit for Creatinine is approximately 13% higher for people identified as African-American. .    BUN/Creatinine Ratio 14 6 - 22 (calc)   Sodium 136 135 - 146 mmol/L   Potassium 3.6 3.5 - 5.3 mmol/L   Chloride 99 98 - 110 mmol/L   CO2 31 20 - 32 mmol/L   Calcium 9.9 8.6 - 10.4 mg/dL   Total Protein 6.9 6.1 - 8.1 g/dL   Albumin 3.8 3.6 - 5.1 g/dL   Globulin 3.1 1.9 - 3.7 g/dL (calc)   AG Ratio 1.2 1.0 - 2.5 (calc)   Total Bilirubin 0.5 0.2 - 1.2 mg/dL   Alkaline phosphatase (APISO) 62 33 - 130 U/L   AST 14 10 - 35 U/L   ALT 12 6 - 29 U/L  Hemoglobin A1c     Status: Abnormal   Collection Time: 12/06/17 11:01 AM  Result Value Ref Range   Hgb A1c MFr Bld  10.3 (H) <5.7 % of total Hgb    Comment: For someone without known diabetes, a hemoglobin A1c value of 6.5% or greater indicates that they may have  diabetes and this should be confirmed with a follow-up  test. . For someone with known diabetes, a value <7% indicates  that their diabetes is well controlled and a value  greater than or equal to 7% indicates suboptimal  control. A1c targets should be individualized based on  duration of diabetes, age, comorbid conditions, and  other considerations. . Currently, no consensus exists regarding use of hemoglobin A1c for diagnosis of diabetes for children. .    Mean Plasma Glucose 249 (calc)   eAG (mmol/L) 13.8 (calc)  PTH, Intact and Calcium     Status: Abnormal   Collection Time: 12/06/17 11:01 AM  Result Value Ref Range   PTH 110 (H) 14 - 64 pg/mL    Comment: . Interpretive Guide    Intact PTH           Calcium ------------------    ----------           ------- Normal Parathyroid    Normal               Normal Hypoparathyroidism    Low or Low Normal    Low Hyperparathyroidism    Primary            Normal or High       High    Secondary          High                 Normal or Low    Tertiary           High  High Non-Parathyroid    Hypercalcemia      Low or Low Normal    High .    Calcium 9.9 8.6 - 10.4 mg/dL    Diabetic Labs (most recent): Lab Results  Component Value Date   HGBA1C 10.3 (H) 12/06/2017   HGBA1C 7.9 (H) 05/21/2017   HGBA1C 9.4 (H) 01/29/2017    Assessment & Plan:   1. Hypercalcemia: Resolved  - Her repeat labs show normal calcium of 9.9, However her PTH remains high at 110.  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 - She is vitamin D replete now . -She will not need any intervention for hypercalcemia at this time. - Her neck/thyroid ultrasound was unremarkable. -She will be considered for 24-hour urine calcium and creatinine measurement on subsequent visits.    2. Uncontrolled type 2 diabetes mellitus without complication, without long-term current use of insulin (HCC) - Her A1c is increasing to 10.3%.  She has maximized her options for oral antidiabetic medications.  She was approached for initiation of basal insulin, however she declines this offer.  She promises to do better on her diet.   -She reluctantly accepts to start monitoring blood glucose 2 times a day-before breakfast and before supper and return in 10 days with her meter and logs for reevaluation.     -In the meantime, I advised her to continue metformin 500 mg p.o. twice daily after breakfast and supper and continue glipizide 10 mg p.o. daily with breakfast.    She will be reapproached for  possible initiation of basal insulin if returns with significantly above target glycemic profile.    3. Essential hypertension -Uncontrolled I advised her to continue lisinopril 30 mg by mouth every morning for now. She is hesitant to add any additional therapy, but willing to discuss this with her PMD.   4. Vitamin D deficiency: See #1 above. -She is status post therapy with vitamin D, currently at 28 ng/dL.     5. Goiter -Her thyroid ultrasound showed mild goiter with no discrete nodules. No intervention needed at this point.   - I advised patient to maintain close follow up with Vidal Schwalbe, MD for primary care needs.  Follow up plan: Return in about 10 days (around 12/27/2017) for follow up with meter and logs- no labs.  Glade Lloyd, MD Phone: [phone]  Fax: [phone]  -  This note was partially dictated with voice recognition software. Similar sounding words can be transcribed inadequately or may not  be corrected upon review.  12/17/2017, 4:25 PM

## 2018-01-01 ENCOUNTER — Ambulatory Visit: Payer: Medicare HMO | Admitting: "Endocrinology

## 2018-01-01 VITALS — BP 146/83 | HR 77 | Ht 65.0 in | Wt 150.0 lb

## 2018-01-01 DIAGNOSIS — E1165 Type 2 diabetes mellitus with hyperglycemia: Secondary | ICD-10-CM

## 2018-01-01 DIAGNOSIS — E559 Vitamin D deficiency, unspecified: Secondary | ICD-10-CM | POA: Diagnosis not present

## 2018-01-01 DIAGNOSIS — E213 Hyperparathyroidism, unspecified: Secondary | ICD-10-CM | POA: Diagnosis not present

## 2018-01-01 DIAGNOSIS — E118 Type 2 diabetes mellitus with unspecified complications: Secondary | ICD-10-CM | POA: Diagnosis not present

## 2018-01-01 DIAGNOSIS — IMO0002 Reserved for concepts with insufficient information to code with codable children: Secondary | ICD-10-CM

## 2018-01-01 NOTE — Patient Instructions (Signed)

## 2018-01-01 NOTE — Progress Notes (Signed)
Subjective:    Patient ID: Maria Parrish, female    DOB: 1946/07/09, PCP Maria Schwalbe, MD   Past Medical History:  Diagnosis Date  . Assistance needed for mobility    walks with walker  . Confusion   . Dementia   . Diabetes mellitus without complication ([place])   . Generalized weakness   . Hypercalcemia   . Hyperlipidemia   . Hyperparathyroidism ([place])   . Hypertension   . Hypertension   . Moderate protein-calorie malnutrition ([place])   . Peripheral neuropathy   . UTI (lower urinary tract infection)    Past Surgical History:  Procedure Laterality Date  . COLONOSCOPY N/A 08/12/2015   Procedure: COLONOSCOPY;  Surgeon: Daneil Dolin, MD;  [facility];  Service: Endoscopy;  Laterality: N/A;  1245pm  . ESOPHAGOGASTRODUODENOSCOPY N/A 08/12/2015   Procedure: ESOPHAGOGASTRODUODENOSCOPY (EGD);  Surgeon: Daneil Dolin, MD;  Location: [facility];  Service: Endoscopy;  Laterality: N/A;  . none     Social History   Socioeconomic History  . Marital status: Widowed    Spouse name: Not on file  . Number of children: 2  . Years of education: Not on file  . Highest education level: Not on file  Occupational History  . Occupation: Nutritional therapist for [employer]  . Financial resource strain: Not on file  . Food insecurity:    Worry: Not on file    Inability: Not on file  . Transportation needs:    Medical: Not on file    Non-medical: Not on file  Tobacco Use  . Smoking status: Never Smoker  . Smokeless tobacco: Never Used  Substance and Sexual Activity  . Alcohol use: No  . Drug use: No  . Sexual activity: Not Currently  Lifestyle  . Physical activity:    Days per week: Not on file    Minutes per session: Not on file  . Stress: Not on file  Relationships  . Social connections:    Talks on phone: Not on file    Gets together: Not on file    Attends religious service: Not on file    Active member of club or organization: Not on file    Attends meetings of  clubs or organizations: Not on file    Relationship status: Not on file  Other Topics Concern  . Not on file  Social History Narrative  . Not on file   Outpatient Encounter Medications as of 01/01/2018  Medication Sig  . aspirin 325 MG tablet Take 1 tablet (325 mg total) by mouth daily.  . Cyanocobalamin (VITAMIN B-12 PO) Take 1 tablet by mouth daily.  Marland Kitchen donepezil (ARICEPT) 10 MG tablet Take 1 tablet by mouth daily.  . folic acid (FOLVITE) 1 MG tablet Take 1 tablet (1 mg total) by mouth daily.  Marland Kitchen glipiZIDE (GLUCOTROL) 10 MG tablet TAKE ONE TABLET BY MOUTH ONCE DAILY WITH BREAKFAST  . lisinopril-hydrochlorothiazide (PRINZIDE,ZESTORETIC) 20-12.5 MG tablet Take 1 tablet by mouth daily. Takes in the morning  . metFORMIN (GLUCOPHAGE) 500 MG tablet Take 1 tablet (500 mg total) by mouth 2 (two) times daily with a meal.  . polyethylene glycol (MIRALAX / GLYCOLAX) packet Take 17 g by mouth daily as needed for mild constipation.  . Vitamin D, Ergocalciferol, (DRISDOL) 50000 units CAPS capsule Take 1 capsule (50,000 Units total) by mouth every 14 (fourteen) days.   No facility-administered encounter medications on file as of 01/01/2018.    ALLERGIES: Allergies  Allergen Reactions  . Penicillins Other (See Comments)    Makes patient sore.  Has patient had a PCN reaction causing immediate rash, facial/tongue/throat swelling, SOB or lightheadedness with hypotension: No Has patient had a PCN reaction causing severe rash involving mucus membranes or skin necrosis: No Has patient had a PCN reaction that required hospitalization No Has patient had a PCN reaction occurring within the last 10 years: No If all of the above answers are "NO", then may proceed with Cephalosporin use.   . Statins Other (See Comments)    Makes wrists and fingers ache   VACCINATION STATUS:  There is no immunization history on file for this patient.  HPI 72 year old female patient with medical history as above. She is  here to follow-up with her meter and logs for uncontrolled type 2 diabetes with A1c of 10.3%.  She is also on follow-up for history of hyperparathyroidism with recent elevation of PTH associated with mild hypercalcemia.    -Since her last visit, she made significant change in her diet helping her control glycemia to average of 149.  She remains on glipizide 10 mg by mouth once a day and metformin 500 mg by mouth once a day. She wishes to avoid insulin treatment.  - Her repeat previsit labs show stable calcium at 9.9, PTH remains stable at 110.   She has steady body weight.  - Her thyroid ultrasound shows mild goiter with no discrete nodules. - No history of diagnosed cancer.   She did not bring any meter nor logs to review. - She has no new complaints.  Review of Systems Constitutional:  + Steady weight,  +fatigue, no subjective hyperthermia/hypothermia Eyes: no blurry vision, no xerophthalmia ENT: no sore throat, no nodules palpated in throat, no dysphagia/odynophagia, no hoarseness Cardiovascular: no chest pain, no palpitations.   Respiratory: no cough/SOB Gastrointestinal: no N/V/D/C Musculoskeletal: no muscle/joint aches Skin: no rashes Neurological: + Disequilibrium, +dizziness Psychiatric: no depression/anxiety   Objective:    BP (!) 146/83   Pulse 77   Ht 5\' 5"  (1.651 m)   Wt 150 lb (68 kg)   BMI 24.96 kg/m   Wt Readings from Last 3 Encounters:  01/01/18 150 lb (68 kg)  12/17/17 149 lb (67.6 kg)  05/30/17 148 lb (67.1 kg)    Physical Exam Constitutional:  Appropriate weight for height, in NAD, she uses a cane to walk around. Eyes: PERRLA, EOMI, no exophthalmos ENT: moist mucous membranes. Neck: She has prominent sternocleidomastoid joints bilaterally, she has mild thyromegaly,  No cervical lymphadenopathy- unchanged from last exam.  Musculoskeletal: no deformities, strength intact in all 4 Skin: moist, warm, no rashes Neurological: no tremor with outstretched  hands.  Recent Results (from the past 2160 hour(s))  Vitamin D, 25-hydroxy     Status: Abnormal   Collection Time: 12/06/17 11:01 AM  Result Value Ref Range   Vit D, 25-Hydroxy 28 (L) 30 - 100 ng/mL    Comment: Vitamin D Status         25-OH Vitamin D: . Deficiency:                    <20 ng/mL Insufficiency:             20 - 29 ng/mL Optimal:                 > or = 30 ng/mL . For 25-OH Vitamin D testing on patients on  D2-supplementation and patients for whom quantitation  of D2  and D3 fractions is required, the Quest[employer]ureD(TM) 25-OH VIT D, (D2,D3), LC/MS/MS is recommended: order  code [id number] (patients >36yrs). . For more information on this test, go to: http://education.questdiagnostics.com/faq/FAQ163 (This link is being provided for  informational/educational purposes only.)   Comprehensive metabolic panel     Status: Abnormal   Collection Time: 12/06/17 11:01 AM  Result Value Ref Range   Glucose, Bld 182 (H) 65 - 99 mg/dL    Comment: .            Fasting reference interval . For someone without known diabetes, a glucose value >125 mg/dL indicates that they may have diabetes and this should be confirmed with a follow-up test. .    BUN 14 7 - 25 mg/dL   Creat 1.01 (H) 0.60 - 0.93 mg/dL    Comment: For patients >2 years of age, the reference limit for Creatinine is approximately 13% higher for people identified as African-American. .    BUN/Creatinine Ratio 14 6 - 22 (calc)   Sodium 136 135 - 146 mmol/L   Potassium 3.6 3.5 - 5.3 mmol/L   Chloride 99 98 - 110 mmol/L   CO2 31 20 - 32 mmol/L   Calcium 9.9 8.6 - 10.4 mg/dL   Total Protein 6.9 6.1 - 8.1 g/dL   Albumin 3.8 3.6 - 5.1 g/dL   Globulin 3.1 1.9 - 3.7 g/dL (calc)   AG Ratio 1.2 1.0 - 2.5 (calc)   Total Bilirubin 0.5 0.2 - 1.2 mg/dL   Alkaline phosphatase (APISO) 62 33 - 130 U/L   AST 14 10 - 35 U/L   ALT 12 6 - 29 U/L  Hemoglobin A1c     Status: Abnormal   Collection Time: 12/06/17 11:01 AM  Result  Value Ref Range   Hgb A1c MFr Bld 10.3 (H) <5.7 % of total Hgb    Comment: For someone without known diabetes, a hemoglobin A1c value of 6.5% or greater indicates that they may have  diabetes and this should be confirmed with a follow-up  test. . For someone with known diabetes, a value <7% indicates  that their diabetes is well controlled and a value  greater than or equal to 7% indicates suboptimal  control. A1c targets should be individualized based on  duration of diabetes, age, comorbid conditions, and  other considerations. . Currently, no consensus exists regarding use of hemoglobin A1c for diagnosis of diabetes for children. .    Mean Plasma Glucose 249 (calc)   eAG (mmol/L) 13.8 (calc)  PTH, Intact and Calcium     Status: Abnormal   Collection Time: 12/06/17 11:01 AM  Result Value Ref Range   PTH 110 (H) 14 - 64 pg/mL    Comment: . Interpretive Guide    Intact PTH           Calcium ------------------    ----------           ------- Normal Parathyroid    Normal               Normal Hypoparathyroidism    Low or Low Normal    Low Hyperparathyroidism    Primary            Normal or High       High    Secondary          High                 Normal or Low    Tertiary  High                 High Non-Parathyroid    Hypercalcemia      Low or Low Normal    High .    Calcium 9.9 8.6 - 10.4 mg/dL    Diabetic Labs (most recent): Lab Results  Component Value Date   HGBA1C 10.3 (H) 12/06/2017   HGBA1C 7.9 (H) 05/21/2017   HGBA1C 9.4 (H) 01/29/2017    Assessment & Plan:   1. Uncontrolled type 2 diabetes mellitus without complication, without long-term current use of insulin ([facility]) - Her recent A1c was 10.3%, however due to significant dietary change since her last visit she came back with controlled glycemia to average of 149 in hopes of avoiding insulin treatment.  -Based on this development, she does not have to be initiated on insulin treatment at this time.   She promises to continue to do better on her diet .   -She reluctantly accepts to start monitoring blood glucose 2 times a day-before breakfast and before bed time.  -  I advised her to continue metformin 500 mg p.o. twice daily after breakfast and supper and continue glipizide 10 mg p.o. daily with breakfast.   -  Suggestion is made for her to avoid simple carbohydrates  from her diet including Cakes, Sweet Desserts / Pastries, Ice Cream, Soda (diet and regular), Sweet Tea, Candies, Chips, Cookies, Store Bought Juices, Alcohol in Excess of  1-2 drinks a day, Artificial Sweeteners, and "Sugar-free" Products. This will help patient to have stable blood glucose profile and potentially avoid unintended weight gain.   2. Hypercalcemia: Resolved  - Her repeat labs show normal calcium of 9.9, However her PTH remains high at 110.  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 - She is vitamin D replete now . -She will not need any intervention for hypercalcemia at this time. - Her neck/thyroid ultrasound was unremarkable. -She will be considered for 24-hour urine calcium and creatinine measurement on subsequent visits.   3. Essential hypertension -Uncontrolled I advised her to continue lisinopril 30 mg by mouth every morning for now. She is hesitant to add any additional therapy, but willing to discuss this with her PMD.   4. Vitamin D deficiency: See #1 above. -She is status post therapy with vitamin D, currently at 28 ng/dL.     5. Goiter -Her thyroid ultrasound showed mild goiter with no discrete nodules. No intervention needed at this point.   - I advised patient to maintain close follow up with Maria Schwalbe, MD for primary care needs.  Follow up plan: Return in about 3 months (around 04/03/2018) for follow up with pre-visit labs, meter, and logs.  Glade Lloyd, MD Phone: [phone]  Fax: [phone]  -  This note was partially dictated with voice recognition  software. Similar sounding words can be transcribed inadequately or may not  be corrected upon review.  01/01/2018, 4:15 PM

## 2018-04-15 ENCOUNTER — Ambulatory Visit: Payer: Medicare HMO | Admitting: "Endocrinology

## 2018-04-29 LAB — COMPLETE METABOLIC PANEL WITH GFR
AG Ratio: 1.2 (calc) (ref 1.0–2.5)
ALT: 9 U/L (ref 6–29)
AST: 10 U/L (ref 10–35)
Albumin: 3.8 g/dL (ref 3.6–5.1)
Alkaline phosphatase (APISO): 94 U/L (ref 33–130)
BUN/Creatinine Ratio: 21 (calc) (ref 6–22)
BUN: 29 mg/dL — AB (ref 7–25)
CALCIUM: 9.5 mg/dL (ref 8.6–10.4)
CO2: 27 mmol/L (ref 20–32)
Chloride: 96 mmol/L — ABNORMAL LOW (ref 98–110)
Creat: 1.36 mg/dL — ABNORMAL HIGH (ref 0.60–0.93)
GFR, EST NON AFRICAN AMERICAN: 39 mL/min/{1.73_m2} — AB (ref >=60)
GFR, Est African American: 45 mL/min/{1.73_m2} — ABNORMAL LOW (ref >=60)
Globulin: 3.3 g/dL (calc) (ref 1.9–3.7)
Glucose, Bld: 412 mg/dL — ABNORMAL HIGH (ref 65–139)
Potassium: 3.9 mmol/L (ref 3.5–5.3)
Sodium: 132 mmol/L — ABNORMAL LOW (ref 135–146)
Total Bilirubin: 0.2 mg/dL (ref 0.2–1.2)
Total Protein: 7.1 g/dL (ref 6.1–8.1)

## 2018-04-29 LAB — HEMOGLOBIN A1C
Hgb A1c MFr Bld: 12.9 % of total Hgb — ABNORMAL HIGH (ref <5.7)
Mean Plasma Glucose: 324 (calc)
eAG (mmol/L): 17.9 (calc)

## 2018-04-29 LAB — PTH, INTACT AND CALCIUM
Calcium: 9.5 mg/dL (ref 8.6–10.4)
PTH: 127 pg/mL — AB (ref 14–64)

## 2018-05-06 ENCOUNTER — Ambulatory Visit (INDEPENDENT_AMBULATORY_CARE_PROVIDER_SITE_OTHER): Payer: Medicare HMO | Admitting: "Endocrinology

## 2018-05-06 ENCOUNTER — Encounter: Payer: Self-pay | Admitting: "Endocrinology

## 2018-05-06 VITALS — BP 147/80 | HR 73 | Ht 65.0 in | Wt 151.0 lb

## 2018-05-06 DIAGNOSIS — E1165 Type 2 diabetes mellitus with hyperglycemia: Secondary | ICD-10-CM

## 2018-05-06 DIAGNOSIS — E118 Type 2 diabetes mellitus with unspecified complications: Secondary | ICD-10-CM

## 2018-05-06 DIAGNOSIS — IMO0002 Reserved for concepts with insufficient information to code with codable children: Secondary | ICD-10-CM

## 2018-05-06 NOTE — Patient Instructions (Signed)

## 2018-05-06 NOTE — Progress Notes (Signed)
Endocrinology follow-up note   Subjective:    Patient ID: Maria Parrish, female    DOB: 04/05/1946, PCP Vidal Schwalbe, MD   Past Medical History:  Diagnosis Date  . Assistance needed for mobility    walks with walker  . Confusion   . Dementia ([place])   . Diabetes mellitus without complication ([place])   . Generalized weakness   . Hypercalcemia   . Hyperlipidemia   . Hyperparathyroidism ([place])   . Hypertension   . Hypertension   . Moderate protein-calorie malnutrition ([place])   . Peripheral neuropathy   . UTI (lower urinary tract infection)    Past Surgical History:  Procedure Laterality Date  . COLONOSCOPY N/A 08/12/2015   Procedure: COLONOSCOPY;  Surgeon: Daneil Dolin, MD;  [facility];  Service: Endoscopy;  Laterality: N/A;  1245pm  . ESOPHAGOGASTRODUODENOSCOPY N/A 08/12/2015   Procedure: ESOPHAGOGASTRODUODENOSCOPY (EGD);  Surgeon: Daneil Dolin, MD;  [facility];  Service: Endoscopy;  Laterality: N/A;  . none     Social History   Socioeconomic History  . Marital status: Widowed    Spouse name: Not on file  . Number of children: 2  . Years of education: Not on file  . Highest education level: Not on file  Occupational History  . Occupation: Nutritional therapist for [employer]  . Financial resource strain: Not on file  . Food insecurity:    Worry: Not on file    Inability: Not on file  . Transportation needs:    Medical: Not on file    Non-medical: Not on file  Tobacco Use  . Smoking status: Never Smoker  . Smokeless tobacco: Never Used  Substance and Sexual Activity  . Alcohol use: No  . Drug use: No  . Sexual activity: Not Currently  Lifestyle  . Physical activity:    Days per week: Not on file    Minutes per session: Not on file  . Stress: Not on file  Relationships  . Social connections:    Talks on phone: Not on file    Gets together: Not on file    Attends religious service: Not on file    Active member of club or  organization: Not on file    Attends meetings of clubs or organizations: Not on file    Relationship status: Not on file  Other Topics Concern  . Not on file  Social History Narrative  . Not on file   Outpatient Encounter Medications as of 05/06/2018  Medication Sig  . aspirin 325 MG tablet Take 1 tablet (325 mg total) by mouth daily.  . Cyanocobalamin (VITAMIN B-12 PO) Take 1 tablet by mouth daily.  Marland Kitchen donepezil (ARICEPT) 10 MG tablet Take 1 tablet by mouth daily.  Marland Kitchen ezetimibe (ZETIA) 10 MG tablet daily.  . folic acid (FOLVITE) 1 MG tablet Take 1 tablet (1 mg total) by mouth daily.  Marland Kitchen glipiZIDE (GLUCOTROL) 10 MG tablet TAKE ONE TABLET BY MOUTH ONCE DAILY WITH BREAKFAST  . methocarbamol (ROBAXIN) 750 MG tablet as needed.  Marland Kitchen olmesartan-hydrochlorothiazide (BENICAR HCT) 40-25 MG tablet daily.  . polyethylene glycol (MIRALAX / GLYCOLAX) packet Take 17 g by mouth daily as needed for mild constipation.  . Vitamin D, Ergocalciferol, (DRISDOL) 50000 units CAPS capsule Take 1 capsule (50,000 Units total) by mouth every 14 (fourteen) days.  . [DISCONTINUED] lisinopril-hydrochlorothiazide (PRINZIDE,ZESTORETIC) 20-12.5 MG tablet Take 1 tablet by mouth daily. Takes in the morning  . [DISCONTINUED] metFORMIN (GLUCOPHAGE) 500  MG tablet Take 1 tablet (500 mg total) by mouth 2 (two) times daily with a meal.   No facility-administered encounter medications on file as of 05/06/2018.    ALLERGIES: Allergies  Allergen Reactions  . Penicillins Other (See Comments)    Makes patient sore.  Has patient had a PCN reaction causing immediate rash, facial/tongue/throat swelling, SOB or lightheadedness with hypotension: No Has patient had a PCN reaction causing severe rash involving mucus membranes or skin necrosis: No Has patient had a PCN reaction that required hospitalization No Has patient had a PCN reaction occurring within the last 10 years: No If all of the above answers are "NO", then may proceed with  Cephalosporin use.   . Statins Other (See Comments)    Makes wrists and fingers ache   VACCINATION STATUS:  There is no immunization history on file for this patient.  HPI 72 year old female patient with medical history as above.  She was originally seen for secondary hyperparathyroidism with elevated PTH as a result of CKD.  More recent PTH is 127 associated with normal calcium of 9.5.   -Her more recent visits are concerning her uncontrolled type 2 diabetes.  Her A1c has progressively gotten worse to a current level of 12.9%.  This patient was approached with insulin management on more than 2 separate occasions in the past, however, unfortunately she declined this treatment option. -  She remains on glipizide 10 mg by mouth once a day and metformin 500 mg by mouth once a day.  -She admits to have relaxed on her diet including consumption of sweets and sweetened beverages.  She has steady body weight.  - Her thyroid ultrasound shows mild goiter with no discrete nodules. - No history of diagnosed cancer.   She did not bring any meter nor logs to review. - She has no new complaints.  Review of Systems Constitutional:  + Steady weight,  + fatigue, no subjective hypothermia nor hypothermia.   Eyes: no blurry vision, no xerophthalmia ENT: no sore throat, no nodules palpated in throat, no dysphagia/odynophagia, no hoarseness Cardiovascular: no chest pain, no palpitations.    Musculoskeletal: no muscle/joint aches Skin: no rashes Neurological: + Disequilibrium, +dizziness Psychiatric: no depression/anxiety   Objective:    BP (!) 147/80   Pulse 73   Ht 5\' 5"  (1.651 m)   Wt 151 lb (68.5 kg)   BMI 25.13 kg/m   Wt Readings from Last 3 Encounters:  05/06/18 151 lb (68.5 kg)  01/01/18 150 lb (68 kg)  12/17/17 149 lb (67.6 kg)    Physical Exam Constitutional:  Appropriate weight for height, in NAD, she uses a cane to walk around,  + reluctant affect. Eyes: PERRLA, EOMI, no  exophthalmos ENT: moist mucous membranes. Neck: She has prominent sternocleidomastoid joints bilaterally, she has mild thyromegaly,  No cervical lymphadenopathy- unchanged from last exam.  Musculoskeletal: no deformities, strength intact in all 4 Skin: moist, warm, no rashes Neurological: no tremor with outstretched hands.  Recent Results (from the past 2160 hour(s))  PTH, intact and calcium     Status: Abnormal   Collection Time: 04/26/18  4:21 PM  Result Value Ref Range   PTH 127 (H) 14 - 64 pg/mL    Comment: . Interpretive Guide    Intact PTH           Calcium ------------------    ----------           ------- Normal Parathyroid    Normal  Normal Hypoparathyroidism    Low or Low Normal    Low Hyperparathyroidism    Primary            Normal or High       High    Secondary          High                 Normal or Low    Tertiary           High                 High Non-Parathyroid    Hypercalcemia      Low or Low Normal    High .    Calcium 9.5 8.6 - 10.4 mg/dL  COMPLETE METABOLIC PANEL WITH GFR     Status: Abnormal   Collection Time: 04/26/18  4:21 PM  Result Value Ref Range   Glucose, Bld 412 (H) 65 - 139 mg/dL    Comment: .        Non-fasting reference interval .    BUN 29 (H) 7 - 25 mg/dL   Creat 1.36 (H) 0.60 - 0.93 mg/dL    Comment: For patients >67 years of age, the reference limit for Creatinine is approximately 13% higher for people identified as African-American. .    GFR, Est Non African American 39 (L) > OR = 60 mL/min/1.14m2   GFR, Est African American 45 (L) > OR = 60 mL/min/1.16m2   BUN/Creatinine Ratio 21 6 - 22 (calc)   Sodium 132 (L) 135 - 146 mmol/L   Potassium 3.9 3.5 - 5.3 mmol/L   Chloride 96 (L) 98 - 110 mmol/L   CO2 27 20 - 32 mmol/L   Calcium 9.5 8.6 - 10.4 mg/dL   Total Protein 7.1 6.1 - 8.1 g/dL   Albumin 3.8 3.6 - 5.1 g/dL   Globulin 3.3 1.9 - 3.7 g/dL (calc)   AG Ratio 1.2 1.0 - 2.5 (calc)   Total Bilirubin 0.2 0.2 - 1.2  mg/dL   Alkaline phosphatase (APISO) 94 33 - 130 U/L   AST 10 10 - 35 U/L   ALT 9 6 - 29 U/L  Hemoglobin A1c     Status: Abnormal   Collection Time: 04/26/18  4:21 PM  Result Value Ref Range   Hgb A1c MFr Bld 12.9 (H) <5.7 % of total Hgb    Comment: For someone without known diabetes, a hemoglobin A1c value of 6.5% or greater indicates that they may have  diabetes and this should be confirmed with a follow-up  test. . For someone with known diabetes, a value <7% indicates  that their diabetes is well controlled and a value  greater than or equal to 7% indicates suboptimal  control. A1c targets should be individualized based on  duration of diabetes, age, comorbid conditions, and  other considerations. . Currently, no consensus exists regarding use of hemoglobin A1c for diagnosis of diabetes for children. .    Mean Plasma Glucose 324 (calc)   eAG (mmol/L) 17.9 (calc)    Diabetic Labs (most recent): Lab Results  Component Value Date   HGBA1C 12.9 (H) 04/26/2018   HGBA1C 10.3 (H) 12/06/2017   HGBA1C 7.9 (H) 05/21/2017    Assessment & Plan:   1. Uncontrolled type 2 diabetes mellitus without complication, without long-term current use of insulin ([place]) -She presents with A1c of 12.9%, increasing from 10.3%.  She still admits to dietary indiscretion including consumption of sweets  and sweetened beverages.   -She is an immediate candidate for insulin treatment, however, tragically she declined this offer for the third time.   She promises to continue to do better on her diet .   -She reluctantly accepts to start monitoring blood glucose 2 times a day-before breakfast and before bed time. -She is encouraged to call clinic if she registers greater than 300 mg/dL.  -Due to declining renal function, I advised her to discontinue metformin.  -She is advised to continue glipizide 10 mg p.o. daily with breakfast.  -  Suggestion is made for her to avoid simple carbohydrates  from her  diet including Cakes, Sweet Desserts / Pastries, Ice Cream, Soda (diet and regular), Sweet Tea, Candies, Chips, Cookies, Store Bought Juices, Alcohol in Excess of  1-2 drinks a day, Artificial Sweeteners, and "Sugar-free" Products. This will help patient to have stable blood glucose profile and potentially avoid unintended weight gain.   2. Hypercalcemia: Resolved  - Her repeat labs show normal calcium of 9.5, However her PTH remains high at 127, this combination is likely a result of CKD. - She is vitamin D replete now . -She will not need any intervention for at this time. - Her neck/thyroid ultrasound was unremarkable. -She will be considered for 24-hour urine calcium and creatinine measurement on subsequent visits.   3. Essential hypertension -Uncontrolled I advised her to continue lisinopril 30 mg by mouth every morning for now. She is hesitant to add any additional therapy, but willing to discuss this with her PMD.   4. Vitamin D deficiency: See #1 above. -She is status post therapy with vitamin D, currently at 28 ng/dL.     5. Goiter -Her thyroid ultrasound showed mild goiter with no discrete nodules. No intervention needed at this point.   - I advised patient to maintain close follow up with Vidal Schwalbe, MD for primary care needs.  Follow up plan: Return in about 3 months (around 08/06/2018) for Meter, and Logs, Follow up with Pre-visit Labs, Meter, and Logs.  Glade Lloyd, MD Phone: [phone]  Fax: [phone]  -  This note was partially dictated with voice recognition software. Similar sounding words can be transcribed inadequately or may not  be corrected upon review.  05/06/2018, 4:18 PM

## 2018-06-18 ENCOUNTER — Ambulatory Visit: Payer: Medicare HMO | Admitting: "Endocrinology

## 2018-08-06 ENCOUNTER — Ambulatory Visit: Payer: Medicare HMO | Admitting: "Endocrinology

## 2018-08-20 LAB — COMPLETE METABOLIC PANEL WITHOUT GFR
AG Ratio: 1.2 (calc) (ref 1.0–2.5)
ALT: 10 U/L (ref 6–29)
AST: 12 U/L (ref 10–35)
Albumin: 3.7 g/dL (ref 3.6–5.1)
Alkaline phosphatase (APISO): 131 U/L (ref 37–153)
BUN/Creatinine Ratio: 23 (calc) — ABNORMAL HIGH (ref 6–22)
BUN: 28 mg/dL — ABNORMAL HIGH (ref 7–25)
CO2: 26 mmol/L (ref 20–32)
Calcium: 9.3 mg/dL (ref 8.6–10.4)
Chloride: 97 mmol/L — ABNORMAL LOW (ref 98–110)
Creat: 1.21 mg/dL — ABNORMAL HIGH (ref 0.60–0.93)
GFR, Est African American: 52 mL/min/1.73m2 — ABNORMAL LOW
GFR, Est Non African American: 45 mL/min/1.73m2 — ABNORMAL LOW
Globulin: 3.1 g/dL (ref 1.9–3.7)
Glucose, Bld: 483 mg/dL — ABNORMAL HIGH (ref 65–139)
Potassium: 4.1 mmol/L (ref 3.5–5.3)
Sodium: 131 mmol/L — ABNORMAL LOW (ref 135–146)
Total Bilirubin: 0.3 mg/dL (ref 0.2–1.2)
Total Protein: 6.8 g/dL (ref 6.1–8.1)

## 2018-08-20 LAB — HEMOGLOBIN A1C: Hgb A1c MFr Bld: >14 %{Hb} — ABNORMAL HIGH

## 2018-09-02 ENCOUNTER — Encounter: Payer: Self-pay | Admitting: "Endocrinology

## 2018-09-02 ENCOUNTER — Ambulatory Visit (INDEPENDENT_AMBULATORY_CARE_PROVIDER_SITE_OTHER): Payer: Medicare HMO | Admitting: "Endocrinology

## 2018-09-02 VITALS — BP 148/81 | HR 87 | Ht 65.0 in | Wt 151.0 lb

## 2018-09-02 DIAGNOSIS — E1165 Type 2 diabetes mellitus with hyperglycemia: Secondary | ICD-10-CM | POA: Diagnosis not present

## 2018-09-02 DIAGNOSIS — E118 Type 2 diabetes mellitus with unspecified complications: Secondary | ICD-10-CM

## 2018-09-02 DIAGNOSIS — IMO0002 Reserved for concepts with insufficient information to code with codable children: Secondary | ICD-10-CM

## 2018-09-02 MED ORDER — ACCU-CHEK GUIDE ME W/DEVICE KIT: 1.0000 | PACK | 0 refills | Status: DC

## 2018-09-02 MED ORDER — INSULIN PEN NEEDLE 31G X 8 MM MISC: 1.0000 | 3 refills | Status: DC

## 2018-09-02 MED ORDER — GLUCOSE BLOOD VI STRP: ORAL_STRIP | 2 refills | Status: DC

## 2018-09-02 MED ORDER — METFORMIN HCL 500 MG PO TABS: 500.0000 mg | ORAL_TABLET | Freq: Two times a day (BID) | ORAL | 3 refills | Status: DC

## 2018-09-02 MED ORDER — INSULIN DEGLUDEC 100 UNIT/ML ~~LOC~~ SOPN: 16.0000 [IU] | PEN_INJECTOR | Freq: Every day | SUBCUTANEOUS | 2 refills | Status: DC

## 2018-09-02 NOTE — Progress Notes (Signed)
Endocrinology follow-up note   Subjective:    Patient ID: Maria Parrish, female    DOB: [birth year], PCP Vidal Schwalbe, MD   Past Medical History:  Diagnosis Date  . Assistance needed for mobility    walks with walker  . Confusion   . Dementia ([place])   . Diabetes mellitus without complication ([place])   . Generalized weakness   . Hypercalcemia   . Hyperlipidemia   . Hyperparathyroidism ([place])   . Hypertension   . Hypertension   . Moderate protein-calorie malnutrition ([place])   . Peripheral neuropathy   . UTI (lower urinary tract infection)    Past Surgical History:  Procedure Laterality Date  . COLONOSCOPY N/A 08/12/2015   Procedure: COLONOSCOPY;  Surgeon: Daneil Dolin, MD;  [facility]: [facility];  Service: Endoscopy;  Laterality: N/A;  1245pm  . ESOPHAGOGASTRODUODENOSCOPY N/A 08/12/2015   Procedure: ESOPHAGOGASTRODUODENOSCOPY (EGD);  Surgeon: Daneil Dolin, MD;  Location: [facility];  Service: Endoscopy;  Laterality: N/A;  . none     Social History   Socioeconomic History  . Marital status: Widowed    Spouse name: Not on file  . Number of children: 2  . Years of education: Not on file  . Highest education level: Not on file  Occupational History  . Occupation: Nutritional therapist for [employer]  . Financial resource strain: Not on file  . Food insecurity:    Worry: Not on file    Inability: Not on file  . Transportation needs:    Medical: Not on file    Non-medical: Not on file  Tobacco Use  . Smoking status: Never Smoker  . Smokeless tobacco: Never Used  Substance and Sexual Activity  . Alcohol use: No  . Drug use: No  . Sexual activity: Not Currently  Lifestyle  . Physical activity:    Days per week: Not on file    Minutes per session: Not on file  . Stress: Not on file  Relationships  . Social connections:    Talks on phone: Not on file    Gets together: Not on file    Attends religious service: Not on file    Active member of club or  organization: Not on file    Attends meetings of clubs or organizations: Not on file    Relationship status: Not on file  Other Topics Concern  . Not on file  Social History Narrative  . Not on file   Outpatient Encounter Medications as of 09/02/2018  Medication Sig  . aspirin EC 81 MG tablet Take 81 mg by mouth daily.  . Blood Glucose Monitoring Suppl (ACCU-CHEK GUIDE ME) w/Device KIT 1 Piece by Does not apply route as directed.  . Cyanocobalamin (VITAMIN B-12 PO) Take 1 tablet by mouth daily.  Marland Kitchen donepezil (ARICEPT) 10 MG tablet Take 1 tablet by mouth daily.  Marland Kitchen ezetimibe (ZETIA) 10 MG tablet daily.  . folic acid (FOLVITE) 1 MG tablet Take 1 tablet (1 mg total) by mouth daily.  Marland Kitchen glucose blood (ACCU-CHEK GUIDE) test strip Use as instructed  . insulin degludec (TRESIBA FLEXTOUCH) 100 UNIT/ML SOPN FlexTouch Pen Inject 0.16 mLs (16 Units total) into the skin daily.  . Insulin Pen Needle (B-D ULTRAFINE III SHORT PEN) 31G X 8 MM MISC 1 each by Does not apply route as directed.  . metFORMIN (GLUCOPHAGE) 500 MG tablet Take 1 tablet (500 mg total) by mouth 2 (two) times daily with a meal.  . methocarbamol (  ROBAXIN) 750 MG tablet as needed.  Marland Kitchen olmesartan-hydrochlorothiazide (BENICAR HCT) 40-25 MG tablet daily.  . polyethylene glycol (MIRALAX / GLYCOLAX) packet Take 17 g by mouth daily as needed for mild constipation.  . Vitamin D, Ergocalciferol, (DRISDOL) 50000 units CAPS capsule Take 1 capsule (50,000 Units total) by mouth every 14 (fourteen) days.  . [DISCONTINUED] aspirin 325 MG tablet Take 1 tablet (325 mg total) by mouth daily.  . [DISCONTINUED] glipiZIDE (GLUCOTROL) 10 MG tablet TAKE ONE TABLET BY MOUTH ONCE DAILY WITH BREAKFAST   No facility-administered encounter medications on file as of 09/02/2018.    ALLERGIES: Allergies  Allergen Reactions  . Penicillins Other (See Comments)    Makes patient sore.  Has patient had a PCN reaction causing immediate rash, facial/tongue/throat  swelling, SOB or lightheadedness with hypotension: No Has patient had a PCN reaction causing severe rash involving mucus membranes or skin necrosis: No Has patient had a PCN reaction that required hospitalization No Has patient had a PCN reaction occurring within the last 10 years: No If all of the above answers are "NO", then may proceed with Cephalosporin use.   . Statins Other (See Comments)    Makes wrists and fingers ache   VACCINATION STATUS:  There is no immunization history on file for this patient.  Diabetes    73 year old female patient with medical history as above.  She was originally seen for secondary hyperparathyroidism with elevated PTH as a result of CKD.  More recent PTH is 127 associated with normal calcium of 9.5.   -She is returning for follow-up of her currently uncontrolled type 2 diabetes.  She was previously approached for insulin treatment on 3 separate occasions, unfortunately she declined.  Her previsit labs show further increase of A1c to  >14%.  Patient reports minimal symptoms, a habit of minimizing her symptoms from hyperglycemia.   -She is accompanied by her son, offering to help for the needed insulin treatment going forward. -  She remains on glipizide 10 mg by mouth once a day.  She has steady body weight.  - Her thyroid ultrasound shows mild goiter with no discrete nodules.  She did not bring any meter nor logs to review. - She has no new complaints.  Review of Systems Constitutional:  + Steady weight,  + fatigue, no subjective hypothermia nor hypothermia.   Eyes: no blurry vision, no xerophthalmia ENT: no sore throat, no nodules palpated in throat, no dysphagia/odynophagia, no hoarseness Cardiovascular: no chest pain, no palpitations.    Musculoskeletal: no muscle/joint aches Skin: no rashes Neurological: + Disequilibrium, +dizziness Psychiatric: no depression/anxiety   Objective:    BP (!) 148/81   Pulse 87   Ht _0  (1.651 m)   Wt  151 lb (68.5 kg)   BMI 25.13 kg/m   Wt Readings from Last 3 Encounters:  09/02/18 151 lb (68.5 kg)  05/06/18 151 lb (68.5 kg)  01/01/18 150 lb (68 kg)    Physical Exam Constitutional:  Appropriate weight for height, in NAD, she uses a cane to walk around,  + reluctant affect. Eyes: PERRLA, EOMI, no exophthalmos ENT: moist mucous membranes. Neck: She has prominent sternocleidomastoid joints bilaterally, she has mild thyromegaly,  No cervical lymphadenopathy- unchanged from last exam.  Musculoskeletal: no deformities, strength intact in all 4 Skin: moist, warm, no rashes Neurological: no tremor with outstretched hands.  Recent Results (from the past 2160 hour(s))  Hemoglobin A1c     Status: Abnormal   Collection Time: 08/19/18  2:12 PM  Result Value Ref Range   Hgb A1c MFr Bld >14.0 (H) <5.7 % of total Hgb    Comment: Verified by repeat analysis. . For someone without known diabetes, a hemoglobin A1c value of 6.5% or greater indicates that they may have  diabetes and this should be confirmed with a follow-up  test. . For someone with known diabetes, a value <7% indicates  that their diabetes is well controlled and a value  greater than or equal to 7% indicates suboptimal  control. A1c targets should be individualized based on  duration of diabetes, age, comorbid conditions, and  other considerations. . Currently, no consensus exists regarding use of hemoglobin A1c for diagnosis of diabetes for children. .    Mean Plasma Glucose  (calc)    Comment: eAG cannot be calculated. Hemoglobin A1c result exceeds the linearity of the assay.   COMPLETE METABOLIC PANEL WITH GFR     Status: Abnormal   Collection Time: 08/19/18  2:12 PM  Result Value Ref Range   Glucose, Bld 483 (H) 65 - 139 mg/dL    Comment: .        Non-fasting reference interval .    BUN 28 (H) 7 - 25 mg/dL   Creat 1.21 (H) 0.60 - 0.93 mg/dL    Comment: For patients >72 years of age, the reference limit for  Creatinine is approximately 13% higher for people identified as African-American. .    GFR, Est Non African American 45 (L) > OR = 60 mL/min/1.22m   GFR, Est African American 52 (L) > OR = 60 mL/min/1.759m  BUN/Creatinine Ratio 23 (H) 6 - 22 (calc)   Sodium 131 (L) 135 - 146 mmol/L   Potassium 4.1 3.5 - 5.3 mmol/L   Chloride 97 (L) 98 - 110 mmol/L   CO2 26 20 - 32 mmol/L   Calcium 9.3 8.6 - 10.4 mg/dL   Total Protein 6.8 6.1 - 8.1 g/dL   Albumin 3.7 3.6 - 5.1 g/dL   Globulin 3.1 1.9 - 3.7 g/dL (calc)   AG Ratio 1.2 1.0 - 2.5 (calc)   Total Bilirubin 0.3 0.2 - 1.2 mg/dL   Alkaline phosphatase (APISO) 131 37 - 153 U/L   AST 12 10 - 35 U/L   ALT 10 6 - 29 U/L    Diabetic Labs (most recent): Lab Results  Component Value Date   HGBA1C >14.0 (H) 08/19/2018   HGBA1C 12.9 (H) 04/26/2018   HGBA1C 10.3 (H) 12/06/2017    Assessment & Plan:   1. Uncontrolled type 2 diabetes mellitus without complication, without long-term current use of insulin ([facility]-She presents with even higher A1c of  >14%. -He is approached for the third time for initiation of insulin treatment with at least basal insulin.  She reluctantly accepts. -I discussed and initiated Tresiba 16 units nightly associated with strict monitoring of blood glucose 4 times a day-before meals and at bedtime and return in 10 days with her meter and logs for reevaluation. -Proper insulin he use was demonstrated for her in the exam room, a sample pen and pen needles were provided. -She will likely require higher dose of insulin, may be MDI as well.  However, given her reluctance, she would benefit from slow adjustment in her treatment. -She is encouraged to call clinic if she registers less than 70 or greater than 300 mg/dL. -At this time, she is advised to discontinue glipizide. -I discussed and resumed low-dose metformin 500 mg p.o. twice daily.  -  Suggestion is made for her to avoid simple carbohydrates  from her diet including  Cakes, Sweet Desserts / Pastries, Ice Cream, Soda (diet and regular), Sweet Tea, Candies, Chips, Cookies, Store Bought Juices, Alcohol in Excess of  1-2 drinks a day, Artificial Sweeteners, and "Sugar-free" Products. This will help patient to have stable blood glucose profile and potentially avoid unintended weight gain.   2. Hypercalcemia: Resolved  - Her repeat labs show normal calcium of 9.5, However her PTH remains high at 127, this combination is likely a result of CKD. - She is vitamin D replete now . -She will not need any intervention for at this time. - Her neck/thyroid ultrasound was unremarkable. -She will be considered for 24-hour urine calcium and creatinine measurement on subsequent visits.   3. Essential hypertension -Her blood pressure is above target, currently on lisinopril 30 mg p.o. daily at breakfast. - She is hesitant to add any additional therapy, but willing to discuss this with her PMD.   4. Vitamin D deficiency: See #1 above. -She is status post therapy with vitamin D, currently at 28 ng/dL.     5. Goiter -Her thyroid ultrasound showed mild goiter with no discrete nodules. No intervention needed at this point.   - I advised patient to maintain close follow up with Vidal Schwalbe, MD for primary care needs.  - Time spent with the patient: 25 min, of which >50% was spent in reviewing her  current and  previous labs/studies, previous treatments, and medications doses and developing a plan for long-term care based on the latest recommendations for standards of care. Maria Parrish participated in the discussions, expressed understanding, and voiced agreement with the above plans.  All questions were answered to her satisfaction. she is encouraged to contact clinic should she have any questions or concerns prior to her return visit.  Follow up plan: Return in about 10 days (around 09/12/2018) for Follow up with Meter and Logs Only - no Labs.  Glade Lloyd, MD Phone:  [phone]  Fax: [phone]  -  This note was partially dictated with voice recognition software. Similar sounding words can be transcribed inadequately or may not  be corrected upon review.  09/02/2018, 4:58 PM

## 2018-09-02 NOTE — Patient Instructions (Signed)

## 2018-09-03 ENCOUNTER — Other Ambulatory Visit: Payer: Self-pay

## 2018-09-03 MED ORDER — GLUCOSE BLOOD VI STRP: ORAL_STRIP | 5 refills | Status: DC

## 2018-09-16 ENCOUNTER — Encounter: Payer: Self-pay | Admitting: "Endocrinology

## 2018-09-16 ENCOUNTER — Ambulatory Visit (INDEPENDENT_AMBULATORY_CARE_PROVIDER_SITE_OTHER): Payer: Medicare HMO | Admitting: "Endocrinology

## 2018-09-16 VITALS — BP 166/70 | HR 77 | Resp 12 | Ht 64.0 in | Wt 157.0 lb

## 2018-09-16 DIAGNOSIS — E1165 Type 2 diabetes mellitus with hyperglycemia: Secondary | ICD-10-CM

## 2018-09-16 DIAGNOSIS — IMO0002 Reserved for concepts with insufficient information to code with codable children: Secondary | ICD-10-CM

## 2018-09-16 DIAGNOSIS — E118 Type 2 diabetes mellitus with unspecified complications: Secondary | ICD-10-CM | POA: Diagnosis not present

## 2018-09-16 MED ORDER — INSULIN DEGLUDEC 100 UNIT/ML ~~LOC~~ SOPN: 22.0000 [IU] | PEN_INJECTOR | Freq: Every day | SUBCUTANEOUS | 2 refills | Status: DC

## 2018-09-16 NOTE — Patient Instructions (Signed)

## 2018-09-16 NOTE — Progress Notes (Signed)
Endocrinology follow-up note   Subjective:    Patient ID: Maria Parrish, female    DOB: April 06, 1946, PCP Vidal Schwalbe, MD   Past Medical History:  Diagnosis Date  . Assistance needed for mobility    walks with walker  . Confusion   . Dementia ([place])   . Diabetes mellitus without complication ([place])   . Generalized weakness   . Hypercalcemia   . Hyperlipidemia   . Hyperparathyroidism ([place])   . Hypertension   . Hypertension   . Moderate protein-calorie malnutrition ([place])   . Peripheral neuropathy   . UTI (lower urinary tract infection)    Past Surgical History:  Procedure Laterality Date  . COLONOSCOPY N/A 08/12/2015   Procedure: COLONOSCOPY;  Surgeon: Daneil Dolin, MD;  [facility];  Service: Endoscopy;  Laterality: N/A;  1245pm  . ESOPHAGOGASTRODUODENOSCOPY N/A 08/12/2015   Procedure: ESOPHAGOGASTRODUODENOSCOPY (EGD);  Surgeon: Daneil Dolin, MD;  [facility];  Service: Endoscopy;  Laterality: N/A;  . none     Social History   Socioeconomic History  . Marital status: Widowed    Spouse name: Not on file  . Number of children: 2  . Years of education: Not on file  . Highest education level: Not on file  Occupational History  . Occupation: Nutritional therapist for [employer]  . Financial resource strain: Not on file  . Food insecurity:    Worry: Not on file    Inability: Not on file  . Transportation needs:    Medical: Not on file    Non-medical: Not on file  Tobacco Use  . Smoking status: Never Smoker  . Smokeless tobacco: Never Used  Substance and Sexual Activity  . Alcohol use: No  . Drug use: No  . Sexual activity: Not Currently  Lifestyle  . Physical activity:    Days per week: Not on file    Minutes per session: Not on file  . Stress: Not on file  Relationships  . Social connections:    Talks on phone: Not on file    Gets together: Not on file    Attends religious service: Not on file    Active member of club or  organization: Not on file    Attends meetings of clubs or organizations: Not on file    Relationship status: Not on file  Other Topics Concern  . Not on file  Social History Narrative  . Not on file   Outpatient Encounter Medications as of 09/16/2018  Medication Sig  . aspirin EC 81 MG tablet Take 81 mg by mouth daily.  . Blood Glucose Monitoring Suppl (ACCU-CHEK GUIDE ME) w/Device KIT 1 Piece by Does not apply route as directed.  . Cyanocobalamin (VITAMIN B-12 PO) Take 1 tablet by mouth daily.  Marland Kitchen donepezil (ARICEPT) 10 MG tablet Take 1 tablet by mouth daily.  Marland Kitchen ezetimibe (ZETIA) 10 MG tablet daily.  . folic acid (FOLVITE) 1 MG tablet Take 1 tablet (1 mg total) by mouth daily.  Marland Kitchen gabapentin (NEURONTIN) 300 MG capsule Take 300 mg by mouth 2 (two) times daily.  Marland Kitchen glucose blood (ACCU-CHEK GUIDE) test strip Use as instructed 4 x daily. E11.65  . insulin degludec (TRESIBA FLEXTOUCH) 100 UNIT/ML SOPN FlexTouch Pen Inject 0.22 mLs (22 Units total) into the skin at bedtime.  . Insulin Pen Needle (B-D ULTRAFINE III SHORT PEN) 31G X 8 MM MISC 1 each by Does not apply route as directed.  . metFORMIN (GLUCOPHAGE)  500 MG tablet Take 1 tablet (500 mg total) by mouth 2 (two) times daily with a meal.  . methocarbamol (ROBAXIN) 750 MG tablet as needed.  Marland Kitchen olmesartan-hydrochlorothiazide (BENICAR HCT) 40-25 MG tablet daily.  . polyethylene glycol (MIRALAX / GLYCOLAX) packet Take 17 g by mouth daily as needed for mild constipation.  . Vitamin D, Ergocalciferol, (DRISDOL) 50000 units CAPS capsule Take 1 capsule (50,000 Units total) by mouth every 14 (fourteen) days.  . [DISCONTINUED] insulin degludec (TRESIBA FLEXTOUCH) 100 UNIT/ML SOPN FlexTouch Pen Inject 0.16 mLs (16 Units total) into the skin daily.   No facility-administered encounter medications on file as of 09/16/2018.    ALLERGIES: Allergies  Allergen Reactions  . Penicillins Other (See Comments)    Makes patient sore.  Has patient had a PCN  reaction causing immediate rash, facial/tongue/throat swelling, SOB or lightheadedness with hypotension: No Has patient had a PCN reaction causing severe rash involving mucus membranes or skin necrosis: No Has patient had a PCN reaction that required hospitalization No Has patient had a PCN reaction occurring within the last 10 years: No If all of the above answers are "NO", then may proceed with Cephalosporin use.   . Statins Other (See Comments)    Makes wrists and fingers ache   VACCINATION STATUS:  There is no immunization history on file for this patient.  Diabetes    73 year old female patient with medical history as above.  She was originally seen for secondary hyperparathyroidism with elevated PTH as a result of CKD.  More recent PTH is 127 associated with normal calcium of 9.5.  She is only on observation management for this.    -She is returning for follow-up of her currently uncontrolled type 2 diabetes.  After reluctance for several month, she accepted low-dose basal insulin during her last visit with Tresiba 16 units nightly.  She saw significant improvement in her glycemic profile, still significantly above target.  Her A1c prior to her last visit was >  14%.   -She is accompanied by her son, offering to help for the needed insulin treatment going forward. -  She remains on metformin 500 mg p.o. twice daily.    She has gained 6 pounds since last visit.  - Her thyroid ultrasound shows mild goiter with no discrete nodules. - She has no new complaints.  Review of Systems Constitutional:  + Weight gain ,  + fatigue, no subjective hypothermia nor hypothermia.   Eyes: no blurry vision, no xerophthalmia ENT: no sore throat, no nodules palpated in throat, no dysphagia/odynophagia, no hoarseness Cardiovascular: no chest pain, no palpitations.    Musculoskeletal: no muscle/joint aches Skin: no rashes Neurological: + Disequilibrium, +dizziness Psychiatric: no  depression/anxiety   Objective:    BP (!) 166/70   Pulse 77   Resp 12   Ht '5\' 4"'  (1.626 m)   Wt 157 lb (71.2 kg)   SpO2 96%   BMI 26.95 kg/m   Wt Readings from Last 3 Encounters:  09/16/18 157 lb (71.2 kg)  09/02/18 151 lb (68.5 kg)  05/06/18 151 lb (68.5 kg)    Physical Exam Constitutional:  Appropriate weight for height, in NAD, she uses a cane to walk around,  + reluctant affect. Eyes: PERRLA, EOMI, no exophthalmos ENT: moist mucous membranes. Neck: She has prominent sternocleidomastoid joints bilaterally, she has mild thyromegaly,  No cervical lymphadenopathy- unchanged from last exam.  Musculoskeletal: no deformities, strength intact in all 4 Skin: moist, warm, no rashes Neurological: no tremor with outstretched  hands.  Recent Results (from the past 2160 hour(s))  Hemoglobin A1c     Status: Abnormal   Collection Time: 08/19/18  2:12 PM  Result Value Ref Range   Hgb A1c MFr Bld >14.0 (H) <5.7 % of total Hgb    Comment: Verified by repeat analysis. . For someone without known diabetes, a hemoglobin A1c value of 6.5% or greater indicates that they may have  diabetes and this should be confirmed with a follow-up  test. . For someone with known diabetes, a value <7% indicates  that their diabetes is well controlled and a value  greater than or equal to 7% indicates suboptimal  control. A1c targets should be individualized based on  duration of diabetes, age, comorbid conditions, and  other considerations. . Currently, no consensus exists regarding use of hemoglobin A1c for diagnosis of diabetes for children. .    Mean Plasma Glucose  (calc)    Comment: eAG cannot be calculated. Hemoglobin A1c result exceeds the linearity of the assay.   COMPLETE METABOLIC PANEL WITH GFR     Status: Abnormal   Collection Time: 08/19/18  2:12 PM  Result Value Ref Range   Glucose, Bld 483 (H) 65 - 139 mg/dL    Comment: .        Non-fasting reference interval .    BUN 28 (H)  7 - 25 mg/dL   Creat 1.21 (H) 0.60 - 0.93 mg/dL    Comment: For patients >68 years of age, the reference limit for Creatinine is approximately 13% higher for people identified as African-American. .    GFR, Est Non African American 45 (L) > OR = 60 mL/min/1.69m   GFR, Est African American 52 (L) > OR = 60 mL/min/1.779m  BUN/Creatinine Ratio 23 (H) 6 - 22 (calc)   Sodium 131 (L) 135 - 146 mmol/L   Potassium 4.1 3.5 - 5.3 mmol/L   Chloride 97 (L) 98 - 110 mmol/L   CO2 26 20 - 32 mmol/L   Calcium 9.3 8.6 - 10.4 mg/dL   Total Protein 6.8 6.1 - 8.1 g/dL   Albumin 3.7 3.6 - 5.1 g/dL   Globulin 3.1 1.9 - 3.7 g/dL (calc)   AG Ratio 1.2 1.0 - 2.5 (calc)   Total Bilirubin 0.3 0.2 - 1.2 mg/dL   Alkaline phosphatase (APISO) 131 37 - 153 U/L   AST 12 10 - 35 U/L   ALT 10 6 - 29 U/L    Diabetic Labs (most recent): Lab Results  Component Value Date   HGBA1C >14.0 (H) 08/19/2018   HGBA1C 12.9 (H) 04/26/2018   HGBA1C 10.3 (H) 12/06/2017    Assessment & Plan:   1. Uncontrolled type 2 diabetes mellitus without complication, without long-term current use of insulin ([place]-Prior to her last visit, A1c was   >14%. -She has responded to low-dose basal insulin she reluctantly accepted during her last visit.    -I discussed and increased her Tresiba to 22 units nightly associated with strict monitoring of blood glucose 2 times a day-before breakfast and at bedtime and return in 9 weeks with repeat labs in logs for reevaluation.    -Proper insulin he use was demonstrated for her in the exam room, a sample pen and pen needles were provided. -She will likely require higher dose of insulin, may be MDI as well.  However, given her reluctance, she will benefit from slow adjustment in her treatment. -She is encouraged to call clinic if she registers less than  70 or greater than 300 mg/dL. -She is advised to continue low-dose metformin 500 mg p.o. twice daily.  - Patient admits there is a room for  improvement in her diet and drink choices. -  Suggestion is made for her to avoid simple carbohydrates  from her diet including Cakes, Sweet Desserts / Pastries, Ice Cream, Soda (diet and regular), Sweet Tea, Candies, Chips, Cookies, Store Bought Juices, Alcohol in Excess of  1-2 drinks a day, Artificial Sweeteners, and "Sugar-free" Products. This will help patient to have stable blood glucose profile and potentially avoid unintended weight gain.  2. Hypercalcemia: Resolved  - Her repeat labs show normal calcium of 9.5, However her PTH remains high at 127, this combination is likely a result of CKD. - She is vitamin D replete now . -She will not need any intervention for at this time. - Her neck/thyroid ultrasound was unremarkable. -She will be considered for 24-hour urine calcium and creatinine measurement on subsequent visits.   3. Essential hypertension -Her blood pressure is above target, currently on lisinopril 30 mg p.o. daily at breakfast. - She is hesitant to add any additional therapy, but willing to discuss this with her PMD.   4. Vitamin D deficiency: See #1 above. -She is status post therapy with vitamin D, currently at 28 ng/dL.     5. Goiter -Her thyroid ultrasound showed mild goiter with no discrete nodules. No intervention needed at this point.   - I advised patient to maintain close follow up with Vidal Schwalbe, MD for primary care needs. - Time spent with the patient: 25 min, of which >50% was spent in reviewing her blood glucose logs , discussing her hypoglycemia and hyperglycemia episodes, reviewing her current and  previous labs / studies and medications  doses and developing a plan to avoid hypoglycemia and hyperglycemia. Please refer to Patient Instructions for Blood Glucose Monitoring and Insulin/Medications Dosing Guide"  in media tab for additional information. Byron M Rawe participated in the discussions, expressed understanding, and voiced agreement with the  above plans.  All questions were answered to her satisfaction. she is encouraged to contact clinic should she have any questions or concerns prior to her return visit.   Follow up plan: Return in about 9 weeks (around 11/18/2018) for Follow up with Pre-visit Labs, Meter, and Logs.  Glade Lloyd, MD Phone: [phone]  Fax: [phone]  -  This note was partially dictated with voice recognition software. Similar sounding words can be transcribed inadequately or may not  be corrected upon review.  09/16/2018, 3:08 PM

## 2018-11-07 LAB — COMPLETE METABOLIC PANEL WITH GFR
AG RATIO: 1 (calc) (ref 1.0–2.5)
ALKALINE PHOSPHATASE (APISO): 63 U/L (ref 37–153)
ALT: 44 U/L — ABNORMAL HIGH (ref 6–29)
AST: 23 U/L (ref 10–35)
Albumin: 3.4 g/dL — ABNORMAL LOW (ref 3.6–5.1)
BUN/Creatinine Ratio: 20 (calc) (ref 6–22)
BUN: 23 mg/dL (ref 7–25)
CO2: 29 mmol/L (ref 20–32)
Calcium: 9.5 mg/dL (ref 8.6–10.4)
Chloride: 104 mmol/L (ref 98–110)
Creat: 1.13 mg/dL — ABNORMAL HIGH (ref 0.60–0.93)
GFR, Est African American: 56 mL/min/{1.73_m2} — ABNORMAL LOW (ref >=60)
GFR, Est Non African American: 49 mL/min/{1.73_m2} — ABNORMAL LOW (ref >=60)
GLOBULIN: 3.3 g/dL (ref 1.9–3.7)
Glucose, Bld: 77 mg/dL (ref 65–99)
POTASSIUM: 3.7 mmol/L (ref 3.5–5.3)
SODIUM: 140 mmol/L (ref 135–146)
Total Bilirubin: 0.4 mg/dL (ref 0.2–1.2)
Total Protein: 6.7 g/dL (ref 6.1–8.1)

## 2018-11-07 LAB — HEMOGLOBIN A1C
HEMOGLOBIN A1C: 10.6 %{Hb} — AB (ref <5.7)
MEAN PLASMA GLUCOSE: 258 (calc)
eAG (mmol/L): 14.3 (calc)

## 2018-11-18 ENCOUNTER — Other Ambulatory Visit: Payer: Self-pay

## 2018-11-18 ENCOUNTER — Ambulatory Visit: Payer: Medicare HMO | Admitting: "Endocrinology

## 2018-11-18 ENCOUNTER — Ambulatory Visit (INDEPENDENT_AMBULATORY_CARE_PROVIDER_SITE_OTHER): Payer: Medicare HMO | Admitting: "Endocrinology

## 2018-11-18 ENCOUNTER — Encounter: Payer: Self-pay | Admitting: "Endocrinology

## 2018-11-18 VITALS — BP 171/97 | HR 73 | Ht 64.0 in | Wt 160.0 lb

## 2018-11-18 DIAGNOSIS — E118 Type 2 diabetes mellitus with unspecified complications: Secondary | ICD-10-CM | POA: Diagnosis not present

## 2018-11-18 DIAGNOSIS — E1165 Type 2 diabetes mellitus with hyperglycemia: Secondary | ICD-10-CM

## 2018-11-18 DIAGNOSIS — E782 Mixed hyperlipidemia: Secondary | ICD-10-CM

## 2018-11-18 DIAGNOSIS — I10 Essential (primary) hypertension: Secondary | ICD-10-CM | POA: Diagnosis not present

## 2018-11-18 DIAGNOSIS — IMO0002 Reserved for concepts with insufficient information to code with codable children: Secondary | ICD-10-CM

## 2018-11-18 NOTE — Progress Notes (Signed)
Endocrinology follow-up note   Subjective:    Patient ID: Maria Parrish, female    DOB: Jan 30, 1946, PCP Vidal Schwalbe, MD   Past Medical History:  Diagnosis Date  . Assistance needed for mobility    walks with walker  . Confusion   . Dementia ([place])   . Diabetes mellitus without complication ([place])   . Generalized weakness   . Hypercalcemia   . Hyperlipidemia   . Hyperparathyroidism ([place])   . Hypertension   . Hypertension   . Moderate protein-calorie malnutrition ([place])   . Peripheral neuropathy   . UTI (lower urinary tract infection)    Past Surgical History:  Procedure Laterality Date  . COLONOSCOPY N/A 08/12/2015   Procedure: COLONOSCOPY;  Surgeon: Daneil Dolin, MD;  [facility];  Service: Endoscopy;  Laterality: N/A;  1245pm  . ESOPHAGOGASTRODUODENOSCOPY N/A 08/12/2015   Procedure: ESOPHAGOGASTRODUODENOSCOPY (EGD);  Surgeon: Daneil Dolin, MD;  Location: [facility];  Service: Endoscopy;  Laterality: N/A;  . none     Social History   Socioeconomic History  . Marital status: Widowed    Spouse name: Not on file  . Number of children: 2  . Years of education: Not on file  . Highest education level: Not on file  Occupational History  . Occupation: Nutritional therapist for [employer]  . Financial resource strain: Not on file  . Food insecurity:    Worry: Not on file    Inability: Not on file  . Transportation needs:    Medical: Not on file    Non-medical: Not on file  Tobacco Use  . Smoking status: Never Smoker  . Smokeless tobacco: Never Used  Substance and Sexual Activity  . Alcohol use: No  . Drug use: No  . Sexual activity: Not Currently  Lifestyle  . Physical activity:    Days per week: Not on file    Minutes per session: Not on file  . Stress: Not on file  Relationships  . Social connections:    Talks on phone: Not on file    Gets together: Not on file    Attends religious service: Not on file    Active member of club or  organization: Not on file    Attends meetings of clubs or organizations: Not on file    Relationship status: Not on file  Other Topics Concern  . Not on file  Social History Narrative  . Not on file   Outpatient Encounter Medications as of 11/18/2018  Medication Sig  . aspirin EC 81 MG tablet Take 81 mg by mouth daily.  . Blood Glucose Monitoring Suppl (ACCU-CHEK GUIDE ME) w/Device KIT 1 Piece by Does not apply route as directed.  . Cyanocobalamin (VITAMIN B-12 PO) Take 1 tablet by mouth daily.  Marland Kitchen donepezil (ARICEPT) 10 MG tablet Take 1 tablet by mouth daily.  Marland Kitchen ezetimibe (ZETIA) 10 MG tablet daily.  . folic acid (FOLVITE) 1 MG tablet Take 1 tablet (1 mg total) by mouth daily.  Marland Kitchen gabapentin (NEURONTIN) 300 MG capsule Take 300 mg by mouth 2 (two) times daily.  Marland Kitchen glucose blood (ACCU-CHEK GUIDE) test strip Use as instructed 4 x daily. E11.65  . insulin degludec (TRESIBA FLEXTOUCH) 100 UNIT/ML SOPN FlexTouch Pen Inject 0.22 mLs (22 Units total) into the skin at bedtime.  . Insulin Pen Needle (B-D ULTRAFINE III SHORT PEN) 31G X 8 MM MISC 1 each by Does not apply route as directed.  . metFORMIN (GLUCOPHAGE)  500 MG tablet Take 1 tablet (500 mg total) by mouth 2 (two) times daily with a meal.  . methocarbamol (ROBAXIN) 750 MG tablet as needed.  Marland Kitchen olmesartan-hydrochlorothiazide (BENICAR HCT) 40-25 MG tablet daily.  . polyethylene glycol (MIRALAX / GLYCOLAX) packet Take 17 g by mouth daily as needed for mild constipation.  . Vitamin D, Ergocalciferol, (DRISDOL) 50000 units CAPS capsule Take 1 capsule (50,000 Units total) by mouth every 14 (fourteen) days.   No facility-administered encounter medications on file as of 11/18/2018.    ALLERGIES: Allergies  Allergen Reactions  . Penicillins Other (See Comments)    Makes patient sore.  Has patient had a PCN reaction causing immediate rash, facial/tongue/throat swelling, SOB or lightheadedness with hypotension: No Has patient had a PCN reaction  causing severe rash involving mucus membranes or skin necrosis: No Has patient had a PCN reaction that required hospitalization No Has patient had a PCN reaction occurring within the last 10 years: No If all of the above answers are "NO", then may proceed with Cephalosporin use.   . Statins Other (See Comments)    Makes wrists and fingers ache   VACCINATION STATUS:  There is no immunization history on file for this patient.  Diabetes    73 year old female patient with medical history as above.  She was originally seen for secondary hyperparathyroidism with elevated PTH as a result of CKD.  More recent PTH is 127 associated with normal calcium of 9.5.  She is only on observation management for this.    -She is returning for follow-up of her currently uncontrolled type 2 diabetes.  After reluctance for several month, she accepted low-dose basal insulin.  Currently, she is on [place] 22 units nightly.  Her fasting blood glucose average is 160 mg per DL, her previsit labs show improved A1c of 10.6% >  14%.    -  She remains on metformin 500 mg p.o. twice daily.    She has gained 10 pounds since last visit.  - Her thyroid ultrasound shows mild goiter with no discrete nodules. - She has no new complaints.  Review of Systems Constitutional:  + Weight gain ,  + fatigue, no subjective hypothermia nor hypothermia.   Eyes: no blurry vision, no xerophthalmia ENT: no sore throat, no nodules palpated in throat, no dysphagia/odynophagia, no hoarseness Cardiovascular: no chest pain, no palpitations.    Musculoskeletal: no muscle/joint aches Skin: no rashes Neurological: + Disequilibrium, +dizziness Psychiatric: no depression/anxiety   Objective:    BP (!) 171/97   Pulse 73   Ht _0  (1.626 m)   Wt 160 lb (72.6 kg)   BMI 27.46 kg/m   Wt Readings from Last 3 Encounters:  11/18/18 160 lb (72.6 kg)  09/16/18 157 lb (71.2 kg)  09/02/18 151 lb (68.5 kg)    Physical Exam Constitutional:   not in acute distress, normal state of mind Eyes:  EOMI, no exophthalmos Neck: Supple Respiratory: Adequate breathing efforts Musculoskeletal: no gross deformities, strength intact in all four extremities Skin:  no rashes, no hyperemia Neurological: no tremor with outstretched hands.   Recent Results (from the past 2160 hour(s))  Hemoglobin A1c     Status: Abnormal   Collection Time: 11/06/18 11:51 AM  Result Value Ref Range   Hgb A1c MFr Bld 10.6 (H) <5.7 % of total Hgb    Comment: For someone without known diabetes, a hemoglobin A1c value of 6.5% or greater indicates that they may have  diabetes and this should be  confirmed with a follow-up  test. . For someone with known diabetes, a value <7% indicates  that their diabetes is well controlled and a value  greater than or equal to 7% indicates suboptimal  control. A1c targets should be individualized based on  duration of diabetes, age, comorbid conditions, and  other considerations. . Currently, no consensus exists regarding use of hemoglobin A1c for diagnosis of diabetes for children. .    Mean Plasma Glucose 258 (calc)   eAG (mmol/L) 14.3 (calc)  COMPLETE METABOLIC PANEL WITH GFR     Status: Abnormal   Collection Time: 11/06/18 11:51 AM  Result Value Ref Range   Glucose, Bld 77 65 - 99 mg/dL    Comment: .            Fasting reference interval .    BUN 23 7 - 25 mg/dL   Creat 1.13 (H) 0.60 - 0.93 mg/dL    Comment: For patients >57 years of age, the reference limit for Creatinine is approximately 13% higher for people identified as African-American. .    GFR, Est Non African American 49 (L) > OR = 60 mL/min/1.23m   GFR, Est African American 56 (L) > OR = 60 mL/min/1.776m  BUN/Creatinine Ratio 20 6 - 22 (calc)   Sodium 140 135 - 146 mmol/L   Potassium 3.7 3.5 - 5.3 mmol/L   Chloride 104 98 - 110 mmol/L   CO2 29 20 - 32 mmol/L   Calcium 9.5 8.6 - 10.4 mg/dL   Total Protein 6.7 6.1 - 8.1 g/dL   Albumin 3.4 (L)  3.6 - 5.1 g/dL   Globulin 3.3 1.9 - 3.7 g/dL (calc)   AG Ratio 1.0 1.0 - 2.5 (calc)   Total Bilirubin 0.4 0.2 - 1.2 mg/dL   Alkaline phosphatase (APISO) 63 37 - 153 U/L   AST 23 10 - 35 U/L   ALT 44 (H) 6 - 29 U/L    Diabetic Labs (most recent): Lab Results  Component Value Date   HGBA1C 10.6 (H) 11/06/2018   HGBA1C >14.0 (H) 08/19/2018   HGBA1C 12.9 (H) 04/26/2018    Assessment & Plan:   1. Uncontrolled type 2 diabetes mellitus without complication, without long-term current use of insulin (HCC) -Her previsit A1c shows improvement to 10.6% from    >14%. -She has responded to low-dose basal insulin she reluctantly accepted during her last visit.    -She continues to feel comfortable taking insulin. -She is advised to continue [place] 22 units nightly associated with strict monitoring of blood glucose 2 times a day-before breakfast and at bedtime and return in 9 weeks with repeat labs in logs for reevaluation.    -She is encouraged to call clinic if she registers less than 70 or greater than 300 mg/dL. -She is advised to continue low-dose metformin 500 mg p.o. twice daily.  - Patient admits there is a room for improvement in her diet and drink choices. -  Suggestion is made for her to avoid simple carbohydrates  from her diet including Cakes, Sweet Desserts / Pastries, Ice Cream, Soda (diet and regular), Sweet Tea, Candies, Chips, Cookies, Store Bought Juices, Alcohol in Excess of  1-2 drinks a day, Artificial Sweeteners, and "Sugar-free" Products. This will help patient to have stable blood glucose profile and potentially avoid unintended weight gain.  2. Hypercalcemia: Resolved  - Her repeat labs show normal calcium of 9.5, However her PTH remains high at 127, this combination is likely a result of  CKD. - She is vitamin D replete now . -She will not need any intervention for at this time. - Her neck/thyroid ultrasound was unremarkable. -She will be considered for 24-hour  urine calcium and creatinine measurement on subsequent visits.   3. Essential hypertension -Her blood pressure is not controlled to target.  She is currently on lisinopril 30 mg p.o. daily at breakfast.  She has not taken her blood pressure medication this morning.   - She is hesitant to add any additional therapy, but willing to discuss this with her PMD.   4. Vitamin D deficiency: See #1 above. -She is status post therapy with vitamin D, currently at 28 ng/dL.     5. Goiter -Her thyroid ultrasound showed mild goiter with no discrete nodules. No intervention needed at this point.   - I advised patient to maintain close follow up with Vidal Schwalbe, MD for primary care needs. - Time spent with the patient: 25 min, of which >50% was spent in reviewing her blood glucose logs , discussing her hypoglycemia and hyperglycemia episodes, reviewing her current and  previous labs / studies and medications  doses and developing a plan to avoid hypoglycemia and hyperglycemia. Please refer to Patient Instructions for Blood Glucose Monitoring and Insulin/Medications Dosing Guide"  in media tab for additional information. Please  also refer to " Patient Self Inventory" in the Media  tab for reviewed elements of pertinent patient history.  Maria Parrish participated in the discussions, expressed understanding, and voiced agreement with the above plans.  All questions were answered to her satisfaction. she is encouraged to contact clinic should she have any questions or concerns prior to her return visit.   Follow up plan: Return in about 4 months (around 03/21/2019) for Meter, and Logs.  Glade Lloyd, MD Phone: [phone]  Fax: [phone]  -  This note was partially dictated with voice recognition software. Similar sounding words can be transcribed inadequately or may not  be corrected upon review.  11/18/2018, 11:19 AM

## 2019-02-15 LAB — COMPLETE METABOLIC PANEL WITH GFR
AG Ratio: 1.1 (calc) (ref 1.0–2.5)
ALT: 9 U/L (ref 6–29)
AST: 11 U/L (ref 10–35)
Albumin: 3.6 g/dL (ref 3.6–5.1)
Alkaline phosphatase (APISO): 74 U/L (ref 37–153)
BUN/Creatinine Ratio: 26 (calc) — ABNORMAL HIGH (ref 6–22)
BUN: 36 mg/dL — ABNORMAL HIGH (ref 7–25)
CO2: 25 mmol/L (ref 20–32)
Calcium: 9.3 mg/dL (ref 8.6–10.4)
Chloride: 104 mmol/L (ref 98–110)
Creat: 1.4 mg/dL — ABNORMAL HIGH (ref 0.60–0.93)
GFR, Est African American: 43 mL/min/{1.73_m2} — ABNORMAL LOW (ref >=60)
GFR, Est Non African American: 37 mL/min/{1.73_m2} — ABNORMAL LOW (ref >=60)
Globulin: 3.4 g/dL (calc) (ref 1.9–3.7)
Glucose, Bld: 142 mg/dL — ABNORMAL HIGH (ref 65–139)
Potassium: 4.1 mmol/L (ref 3.5–5.3)
Sodium: 139 mmol/L (ref 135–146)
Total Bilirubin: 0.3 mg/dL (ref 0.2–1.2)
Total Protein: 7 g/dL (ref 6.1–8.1)

## 2019-02-15 LAB — HEMOGLOBIN A1C
Hgb A1c MFr Bld: 8.3 % of total Hgb — ABNORMAL HIGH (ref <5.7)
Mean Plasma Glucose: 192 (calc)
eAG (mmol/L): 10.6 (calc)

## 2019-02-18 ENCOUNTER — Ambulatory Visit: Payer: Medicare HMO | Admitting: "Endocrinology

## 2019-02-18 ENCOUNTER — Other Ambulatory Visit: Payer: Self-pay

## 2019-02-18 ENCOUNTER — Encounter: Payer: Self-pay | Admitting: "Endocrinology

## 2019-02-18 ENCOUNTER — Ambulatory Visit (INDEPENDENT_AMBULATORY_CARE_PROVIDER_SITE_OTHER): Payer: Medicare HMO | Admitting: "Endocrinology

## 2019-02-18 DIAGNOSIS — E782 Mixed hyperlipidemia: Secondary | ICD-10-CM | POA: Diagnosis not present

## 2019-02-18 DIAGNOSIS — E118 Type 2 diabetes mellitus with unspecified complications: Secondary | ICD-10-CM

## 2019-02-18 DIAGNOSIS — E1165 Type 2 diabetes mellitus with hyperglycemia: Secondary | ICD-10-CM

## 2019-02-18 DIAGNOSIS — I10 Essential (primary) hypertension: Secondary | ICD-10-CM | POA: Diagnosis not present

## 2019-02-18 DIAGNOSIS — IMO0002 Reserved for concepts with insufficient information to code with codable children: Secondary | ICD-10-CM

## 2019-02-18 NOTE — Progress Notes (Signed)
02/18/2019                                                    Endocrinology Telehealth Visit Follow up Note -During COVID -19 Pandemic  This visit type was conducted due to national recommendations for restrictions regarding the COVID-19 Pandemic  in an effort to limit this patient's exposure and mitigate transmission of the corona virus.  Due to her co-morbid illnesses, Maria Parrish is at  moderate to high risk for complications without adequate follow up.  This format is felt to be most appropriate for her at this time.  I connected with this patient on 02/18/2019   by telephone and verified that I am speaking with the correct person using two identifiers. Maria Parrish, March 29, 1946. she has verbally consented to this visit. All issues noted in this document were discussed and addressed. The format was not optimal for physical exam.    Subjective:    Patient ID: Maria Parrish, female    DOB: 1945/11/26, PCP Vidal Schwalbe, MD   Past Medical History:  Diagnosis Date  . Assistance needed for mobility    walks with walker  . Confusion   . Dementia ([place])   . Diabetes mellitus without complication ([place])   . Generalized weakness   . Hypercalcemia   . Hyperlipidemia   . Hyperparathyroidism ([place])   . Hypertension   . Hypertension   . Moderate protein-calorie malnutrition ([place])   . Peripheral neuropathy   . UTI (lower urinary tract infection)    Past Surgical History:  Procedure Laterality Date  . COLONOSCOPY N/A 08/12/2015   Procedure: COLONOSCOPY;  Surgeon: Daneil Dolin, MD;  [facility];  Service: Endoscopy;  Laterality: N/A;  1245pm  . ESOPHAGOGASTRODUODENOSCOPY N/A 08/12/2015   Procedure: ESOPHAGOGASTRODUODENOSCOPY (EGD);  Surgeon: Daneil Dolin, MD;  [facility];  Service: Endoscopy;  Laterality: N/A;  . none     Social History   Socioeconomic History  . Marital status: Widowed    Spouse name: Not on file  . Number of children: 2  . Years of education: Not  on file  . Highest education level: Not on file  Occupational History  . Occupation: Nutritional therapist for [employer]  . Financial resource strain: Not on file  . Food insecurity    Worry: Not on file    Inability: Not on file  . Transportation needs    Medical: Not on file    Non-medical: Not on file  Tobacco Use  . Smoking status: Never Smoker  . Smokeless tobacco: Never Used  Substance and Sexual Activity  . Alcohol use: No  . Drug use: No  . Sexual activity: Not Currently  Lifestyle  . Physical activity    Days per week: Not on file    Minutes per session: Not on file  . Stress: Not on file  Relationships  . Social Herbalist on phone: Not on file    Gets together: Not on file    Attends religious service: Not on file    Active member of club or organization: Not on file    Attends meetings of clubs or organizations: Not on file    Relationship status: Not on file  Other Topics Concern  . Not on file  Social History  Narrative  . Not on file   Outpatient Encounter Medications as of 02/18/2019  Medication Sig  . aspirin EC 81 MG tablet Take 81 mg by mouth daily.  . Blood Glucose Monitoring Suppl (ACCU-CHEK GUIDE ME) w/Device KIT 1 Piece by Does not apply route as directed.  . Cyanocobalamin (VITAMIN B-12 PO) Take 1 tablet by mouth daily.  Marland Kitchen donepezil (ARICEPT) 10 MG tablet Take 1 tablet by mouth daily.  Marland Kitchen ezetimibe (ZETIA) 10 MG tablet daily.  . folic acid (FOLVITE) 1 MG tablet Take 1 tablet (1 mg total) by mouth daily.  Marland Kitchen gabapentin (NEURONTIN) 300 MG capsule Take 300 mg by mouth 2 (two) times daily.  Marland Kitchen glucose blood (ACCU-CHEK GUIDE) test strip Use as instructed 4 x daily. E11.65  . insulin degludec (TRESIBA FLEXTOUCH) 100 UNIT/ML SOPN FlexTouch Pen Inject 0.22 mLs (22 Units total) into the skin at bedtime.  . Insulin Pen Needle (B-D ULTRAFINE III SHORT PEN) 31G X 8 MM MISC 1 each by Does not apply route as directed.  . metFORMIN (GLUCOPHAGE) 500 MG tablet  Take 1 tablet (500 mg total) by mouth 2 (two) times daily with a meal.  . methocarbamol (ROBAXIN) 750 MG tablet as needed.  Marland Kitchen olmesartan-hydrochlorothiazide (BENICAR HCT) 40-25 MG tablet daily.  . polyethylene glycol (MIRALAX / GLYCOLAX) packet Take 17 g by mouth daily as needed for mild constipation.  . Vitamin D, Ergocalciferol, (DRISDOL) 50000 units CAPS capsule Take 1 capsule (50,000 Units total) by mouth every 14 (fourteen) days.   No facility-administered encounter medications on file as of 02/18/2019.    ALLERGIES: Allergies  Allergen Reactions  . Penicillins Other (See Comments)    Makes patient sore.  Has patient had a PCN reaction causing immediate rash, facial/tongue/throat swelling, SOB or lightheadedness with hypotension: No Has patient had a PCN reaction causing severe rash involving mucus membranes or skin necrosis: No Has patient had a PCN reaction that required hospitalization No Has patient had a PCN reaction occurring within the last 10 years: No If all of the above answers are "NO", then may proceed with Cephalosporin use.   . Statins Other (See Comments)    Makes wrists and fingers ache   VACCINATION STATUS:  There is no immunization history on file for this patient.  Diabetes   73 year old female patient with medical history as above.  She was originally seen for secondary hyperparathyroidism with elevated PTH as a result of CKD.  More recent PTH is 127 associated with normal calcium of 9.5.  She is only on observation management for this.    -She is being engaged in telehealth via telephone for follow-up of her currently uncontrolled.   After reluctance for several month, she accepted low-dose basal insulin.  Currently, she is on [place] 22 units nightly.  Her fasting blood glucose average is  100-160 mg per DL, her previsit labs show improved A1c of 8.3% progressively improving from >  14%.   -  She remains on metformin 500 mg p.o. twice daily.    She has  gained 10 pounds since last visit.  She has no new complaints today.  She feels better. - Her thyroid ultrasound shows mild goiter with no discrete nodules. - She has no new complaints.  Review of Systems Limited as above.   Objective:    There were no vitals taken for this visit.  Wt Readings from Last 3 Encounters:  11/18/18 160 lb (72.6 kg)  09/16/18 157 lb (71.2 kg)  09/02/18 151  lb (68.5 kg)     Recent Results (from the past 2160 hour(s))  Hemoglobin A1c     Status: Abnormal   Collection Time: 02/14/19 10:56 AM  Result Value Ref Range   Hgb A1c MFr Bld 8.3 (H) <5.7 % of total Hgb    Comment: For someone without known diabetes, a hemoglobin A1c value of 6.5% or greater indicates that they may have  diabetes and this should be confirmed with a follow-up  test. . For someone with known diabetes, a value <7% indicates  that their diabetes is well controlled and a value  greater than or equal to 7% indicates suboptimal  control. A1c targets should be individualized based on  duration of diabetes, age, comorbid conditions, and  other considerations. . Currently, no consensus exists regarding use of hemoglobin A1c for diagnosis of diabetes for children. .    Mean Plasma Glucose 192 (calc)   eAG (mmol/L) 10.6 (calc)  COMPLETE METABOLIC PANEL WITH GFR     Status: Abnormal   Collection Time: 02/14/19 10:56 AM  Result Value Ref Range   Glucose, Bld 142 (H) 65 - 139 mg/dL    Comment: .        Non-fasting reference interval .    BUN 36 (H) 7 - 25 mg/dL   Creat 1.40 (H) 0.60 - 0.93 mg/dL    Comment: For patients >17 years of age, the reference limit for Creatinine is approximately 13% higher for people identified as African-American. .    GFR, Est Non African American 37 (L) > OR = 60 mL/min/1.43m   GFR, Est African American 43 (L) > OR = 60 mL/min/1.752m  BUN/Creatinine Ratio 26 (H) 6 - 22 (calc)   Sodium 139 135 - 146 mmol/L   Potassium 4.1 3.5 - 5.3 mmol/L    Chloride 104 98 - 110 mmol/L   CO2 25 20 - 32 mmol/L   Calcium 9.3 8.6 - 10.4 mg/dL   Total Protein 7.0 6.1 - 8.1 g/dL   Albumin 3.6 3.6 - 5.1 g/dL   Globulin 3.4 1.9 - 3.7 g/dL (calc)   AG Ratio 1.1 1.0 - 2.5 (calc)   Total Bilirubin 0.3 0.2 - 1.2 mg/dL   Alkaline phosphatase (APISO) 74 37 - 153 U/L   AST 11 10 - 35 U/L   ALT 9 6 - 29 U/L    Diabetic Labs (most recent): Lab Results  Component Value Date   HGBA1C 8.3 (H) 02/14/2019   HGBA1C 10.6 (H) 11/06/2018   HGBA1C >14.0 (H) 08/19/2018    Assessment & Plan:   1. Uncontrolled type 2 diabetes mellitus without complication, without long-term current use of insulin (HCC) -Her previsit A1c shows improvement to 8.3% from    >14%. -She has responded to low-dose basal insulin she reluctantly accepted during her last visit.    -She continues to feel comfortable taking insulin. -She was approached for slight increase in her [place] she declined to make an adjustment advised to keep it at [place]  units nightly associated with strict monitoring of blood glucose 2 times a day-before breakfast and at bedtime and return in 9 weeks with repeat labs in logs for reevaluation.    -She is encouraged to call clinic if she registers less than 70 or greater than 300 mg/dL. -She is advised to continue low-dose metformin 500 mg p.o. twice daily.    - she  admits there is a room for improvement in her diet and drink choices. -  Suggestion is made for her to avoid simple carbohydrates  from her diet including Cakes, Sweet Desserts / Pastries, Ice Cream, Soda (diet and regular), Sweet Tea, Candies, Chips, Cookies, Sweet Pastries,  Store Bought Juices, Alcohol in Excess of  1-2 drinks a day, Artificial Sweeteners, Coffee Creamer, and "Sugar-free" Products. This will help patient to have stable blood glucose profile and potentially avoid unintended weight gain.   2. Hypercalcemia: Resolved  - Her repeat labs show normal calcium of 9.5,  However her PTH remains high at 127, this combination is likely a result of CKD. - She is vitamin D replete now . -She will not need any intervention for at this time. - Her neck/thyroid ultrasound was unremarkable. -She will be considered for 24-hour urine calcium and creatinine measurement on subsequent visits.   3. Essential hypertension -she is advised to home monitor blood pressure and report if > 140/90 on 2 separate readings. -  She is currently on lisinopril 30 mg p.o. daily at breakfast.  She has not taken her blood pressure medication this morning.   - She is hesitant to add any additional therapy, but willing to discuss this with her PMD.   4. Vitamin D deficiency: See #1 above. -She is status post therapy with vitamin D, currently at 28 ng/dL.     5. Goiter -Her thyroid ultrasound showed mild goiter with no discrete nodules. No intervention needed at this point.   - I advised patient to maintain close follow up with Vidal Schwalbe, MD for primary care needs.  - Patient Care Time Today:  25 min, of which >50% was spent in  counseling and the rest reviewing her  current and  previous labs/studies, previous treatments, her blood glucose readings, and medications' doses and developing a plan for long-term care based on the latest recommendations for standards of care.   Tenley M Maziarz participated in the discussions, expressed understanding, and voiced agreement with the above plans.  All questions were answered to her satisfaction. she is encouraged to contact clinic should she have any questions or concerns prior to her return visit.   Follow up plan: Return in about 4 months (around 06/20/2019) for Follow up with Pre-visit Labs, Meter, and Logs.  Glade Lloyd, MD Phone: [phone]  Fax: [phone]  -  This note was partially dictated with voice recognition software. Similar sounding words can be transcribed inadequately or may not  be corrected upon review.  02/18/2019,  12:55 PM

## 2019-04-05 ENCOUNTER — Other Ambulatory Visit: Payer: Self-pay | Admitting: "Endocrinology

## 2019-06-25 ENCOUNTER — Ambulatory Visit: Payer: Medicare HMO | Admitting: "Endocrinology

## 2019-07-14 ENCOUNTER — Other Ambulatory Visit: Payer: Self-pay | Admitting: "Endocrinology

## 2019-07-17 LAB — COMPLETE METABOLIC PANEL WITH GFR
AG Ratio: 1 (calc) (ref 1.0–2.5)
ALT: 10 U/L (ref 6–29)
AST: 11 U/L (ref 10–35)
Albumin: 3.6 g/dL (ref 3.6–5.1)
Alkaline phosphatase (APISO): 72 U/L (ref 37–153)
BUN/Creatinine Ratio: 17 (calc) (ref 6–22)
BUN: 25 mg/dL (ref 7–25)
CO2: 28 mmol/L (ref 20–32)
Calcium: 9.5 mg/dL (ref 8.6–10.4)
Chloride: 100 mmol/L (ref 98–110)
Creat: 1.51 mg/dL — ABNORMAL HIGH (ref 0.60–0.93)
GFR, Est African American: 40 mL/min/{1.73_m2} — ABNORMAL LOW (ref >=60)
GFR, Est Non African American: 34 mL/min/{1.73_m2} — ABNORMAL LOW (ref >=60)
Globulin: 3.7 g/dL (calc) (ref 1.9–3.7)
Glucose, Bld: 274 mg/dL — ABNORMAL HIGH (ref 65–99)
Potassium: 3.1 mmol/L — ABNORMAL LOW (ref 3.5–5.3)
Sodium: 138 mmol/L (ref 135–146)
Total Bilirubin: 0.4 mg/dL (ref 0.2–1.2)
Total Protein: 7.3 g/dL (ref 6.1–8.1)

## 2019-07-17 LAB — HEMOGLOBIN A1C
Hgb A1c MFr Bld: 8.7 % of total Hgb — ABNORMAL HIGH (ref <5.7)
Mean Plasma Glucose: 203 (calc)
eAG (mmol/L): 11.2 (calc)

## 2019-07-23 ENCOUNTER — Ambulatory Visit (INDEPENDENT_AMBULATORY_CARE_PROVIDER_SITE_OTHER): Payer: Medicare HMO | Admitting: "Endocrinology

## 2019-07-23 ENCOUNTER — Encounter: Payer: Self-pay | Admitting: "Endocrinology

## 2019-07-23 DIAGNOSIS — E1165 Type 2 diabetes mellitus with hyperglycemia: Secondary | ICD-10-CM

## 2019-07-23 DIAGNOSIS — IMO0002 Reserved for concepts with insufficient information to code with codable children: Secondary | ICD-10-CM

## 2019-07-23 DIAGNOSIS — E118 Type 2 diabetes mellitus with unspecified complications: Secondary | ICD-10-CM

## 2019-07-23 DIAGNOSIS — E213 Hyperparathyroidism, unspecified: Secondary | ICD-10-CM | POA: Diagnosis not present

## 2019-07-23 DIAGNOSIS — E782 Mixed hyperlipidemia: Secondary | ICD-10-CM | POA: Diagnosis not present

## 2019-07-23 DIAGNOSIS — E559 Vitamin D deficiency, unspecified: Secondary | ICD-10-CM

## 2019-07-23 DIAGNOSIS — I10 Essential (primary) hypertension: Secondary | ICD-10-CM

## 2019-07-23 NOTE — Progress Notes (Signed)
07/23/2019                                                    Endocrinology Telehealth Visit Follow up Note -During COVID -19 Pandemic  This visit type was conducted due to national recommendations for restrictions regarding the COVID-19 Pandemic  in an effort to limit this patient's exposure and mitigate transmission of the corona virus.  Due to her co-morbid illnesses, Maria Parrish is at  moderate to high risk for complications without adequate follow up.  This format is felt to be most appropriate for her at this time.  I connected with this patient on 07/23/2019   by telephone and verified that I am speaking with the correct person using two identifiers. Vaishnavi M Darling, [birth year]. she has verbally consented to this visit. All issues noted in this document were discussed and addressed. The format was not optimal for physical exam.    Subjective:    Patient ID: Maria Parrish, female    DOB: 1945/09/15, PCP Vidal Schwalbe, MD   Past Medical History:  Diagnosis Date  . Assistance needed for mobility    walks with walker  . Confusion   . Dementia ([place])   . Diabetes mellitus without complication ([place])   . Generalized weakness   . Hypercalcemia   . Hyperlipidemia   . Hyperparathyroidism ([place])   . Hypertension   . Hypertension   . Moderate protein-calorie malnutrition ([place])   . Peripheral neuropathy   . UTI (lower urinary tract infection)    Past Surgical History:  Procedure Laterality Date  . COLONOSCOPY N/A 08/12/2015   Procedure: COLONOSCOPY;  Surgeon: Daneil Dolin, MD;  [facility];  Service: Endoscopy;  Laterality: N/A;  1245pm  . ESOPHAGOGASTRODUODENOSCOPY N/A 08/12/2015   Procedure: ESOPHAGOGASTRODUODENOSCOPY (EGD);  Surgeon: Daneil Dolin, MD;  Location: [facility];  Service: Endoscopy;  Laterality: N/A;  . none     Social History   Socioeconomic History  . Marital status: Widowed    Spouse name: Not on file  . Number of children: 2  . Years of education: Not  on file  . Highest education level: Not on file  Occupational History  . Occupation: Nutritional therapist for [employer] Use  . Smoking status: Never Smoker  . Smokeless tobacco: Never Used  Substance and Sexual Activity  . Alcohol use: No  . Drug use: No  . Sexual activity: Not Currently  Other Topics Concern  . Not on file  Social History Narrative  . Not on file   Social Determinants of Health   Financial Resource Strain:   . Difficulty of Paying Living Expenses: Not on file  Food Insecurity:   . Worried About Charity fundraiser in the Last Year: Not on file  . Ran Out of Food in the Last Year: Not on file  Transportation Needs:   . Lack of Transportation (Medical): Not on file  . Lack of Transportation (Non-Medical): Not on file  Physical Activity:   . Days of Exercise per Week: Not on file  . Minutes of Exercise per Session: Not on file  Stress:   . Feeling of Stress : Not on file  Social Connections:   . Frequency of Communication with Friends and Family: Not on file  . Frequency of Social Gatherings with  Friends and Family: Not on file  . Attends Religious Services: Not on file  . Active Member of Clubs or Organizations: Not on file  . Attends Archivist Meetings: Not on file  . Marital Status: Not on file   Outpatient Encounter Medications as of 07/23/2019  Medication Sig  . aspirin EC 81 MG tablet Take 81 mg by mouth daily.  . Blood Glucose Monitoring Suppl (ACCU-CHEK GUIDE ME) w/Device KIT 1 Piece by Does not apply route as directed.  . Cyanocobalamin (VITAMIN B-12 PO) Take 1 tablet by mouth daily.  Marland Kitchen donepezil (ARICEPT) 10 MG tablet Take 1 tablet by mouth daily.  Marland Kitchen ezetimibe (ZETIA) 10 MG tablet daily.  . folic acid (FOLVITE) 1 MG tablet Take 1 tablet (1 mg total) by mouth daily.  Marland Kitchen gabapentin (NEURONTIN) 300 MG capsule Take 300 mg by mouth 2 (two) times daily.  Marland Kitchen glucose blood (ACCU-CHEK GUIDE) test strip Use as instructed 4 x daily. E11.65  . insulin  degludec (TRESIBA FLEXTOUCH) 100 UNIT/ML SOPN FlexTouch Pen Inject 0.22 mLs (22 Units total) into the skin daily.  . Insulin Pen Needle (B-D ULTRAFINE III SHORT PEN) 31G X 8 MM MISC 1 each by Does not apply route as directed.  . metFORMIN (GLUCOPHAGE) 500 MG tablet Take 1 tablet (500 mg total) by mouth 2 (two) times daily with a meal.  . methocarbamol (ROBAXIN) 750 MG tablet as needed.  Marland Kitchen olmesartan-hydrochlorothiazide (BENICAR HCT) 40-25 MG tablet daily.  . polyethylene glycol (MIRALAX / GLYCOLAX) packet Take 17 g by mouth daily as needed for mild constipation.  . Vitamin D, Ergocalciferol, (DRISDOL) 50000 units CAPS capsule Take 1 capsule (50,000 Units total) by mouth every 14 (fourteen) days.   No facility-administered encounter medications on file as of 07/23/2019.   ALLERGIES: Allergies  Allergen Reactions  . Penicillins Other (See Comments)    Makes patient sore.  Has patient had a PCN reaction causing immediate rash, facial/tongue/throat swelling, SOB or lightheadedness with hypotension: No Has patient had a PCN reaction causing severe rash involving mucus membranes or skin necrosis: No Has patient had a PCN reaction that required hospitalization No Has patient had a PCN reaction occurring within the last 10 years: No If all of the above answers are "NO", then may proceed with Cephalosporin use.   . Statins Other (See Comments)    Makes wrists and fingers ache   VACCINATION STATUS:  There is no immunization history on file for this patient.  Diabetes   74 year old female patient with medical history as above.  She was originally seen for secondary hyperparathyroidism with elevated PTH as a result of CKD.  More recent PTH is 127 associated with normal calcium of 9.5.  She is only on observation management for this.    -She is being engaged in telehealth via telephone for follow-up of her currently uncontrolled, type 2 diabetes.   After reluctance for several months, she accepted  low-dose basal insulin.  Currently, she is on [place] 22 units nightly.  Her fasting blood glucose average is  122- 180 mg per DL, her previsit labs show improved A1c of 8.7% progressively improving from >  14%.   -  She also remains on metformin 500 mg p.o. twice daily.    She has gained 10 pounds since last visit.  She has no new complaints today.  She feels better. - Her thyroid ultrasound shows mild goiter with no discrete nodules. - She has no new complaints.  Review of Systems  Limited as above.   Objective:    There were no vitals taken for this visit.  Wt Readings from Last 3 Encounters:  11/18/18 160 lb (72.6 kg)  09/16/18 157 lb (71.2 kg)  09/02/18 151 lb (68.5 kg)     Recent Results (from the past 2160 hour(s))  Hemoglobin A1c     Status: Abnormal   Collection Time: 07/16/19 11:11 AM  Result Value Ref Range   Hgb A1c MFr Bld 8.7 (H) <5.7 % of total Hgb    Comment: For someone without known diabetes, a hemoglobin A1c value of 6.5% or greater indicates that they may have  diabetes and this should be confirmed with a follow-up  test. . For someone with known diabetes, a value <7% indicates  that their diabetes is well controlled and a value  greater than or equal to 7% indicates suboptimal  control. A1c targets should be individualized based on  duration of diabetes, age, comorbid conditions, and  other considerations. . Currently, no consensus exists regarding use of hemoglobin A1c for diagnosis of diabetes for children. .    Mean Plasma Glucose 203 (calc)   eAG (mmol/L) 11.2 (calc)  COMPLETE METABOLIC PANEL WITH GFR     Status: Abnormal   Collection Time: 07/16/19 11:11 AM  Result Value Ref Range   Glucose, Bld 274 (H) 65 - 99 mg/dL    Comment: .            Fasting reference interval . For someone without known diabetes, a glucose value >125 mg/dL indicates that they may have diabetes and this should be confirmed with a follow-up test. .    BUN 25 7 -  25 mg/dL   Creat 1.51 (H) 0.60 - 0.93 mg/dL    Comment: For patients >73 years of age, the reference limit for Creatinine is approximately 13% higher for people identified as African-American. .    GFR, Est Non African American 34 (L) > OR = 60 mL/min/1.50m   GFR, Est African American 40 (L) > OR = 60 mL/min/1.74m  BUN/Creatinine Ratio 17 6 - 22 (calc)   Sodium 138 135 - 146 mmol/L   Potassium 3.1 (L) 3.5 - 5.3 mmol/L   Chloride 100 98 - 110 mmol/L   CO2 28 20 - 32 mmol/L   Calcium 9.5 8.6 - 10.4 mg/dL   Total Protein 7.3 6.1 - 8.1 g/dL   Albumin 3.6 3.6 - 5.1 g/dL   Globulin 3.7 1.9 - 3.7 g/dL (calc)   AG Ratio 1.0 1.0 - 2.5 (calc)   Total Bilirubin 0.4 0.2 - 1.2 mg/dL   Alkaline phosphatase (APISO) 72 37 - 153 U/L   AST 11 10 - 35 U/L   ALT 10 6 - 29 U/L    Diabetic Labs (most recent): Lab Results  Component Value Date   HGBA1C 8.7 (H) 07/16/2019   HGBA1C 8.3 (H) 02/14/2019   HGBA1C 10.6 (H) 11/06/2018    Assessment & Plan:   1. Uncontrolled type 2 diabetes mellitus without complication, without long-term current use of insulin (HCC) -Her previsit A1c shows improvement to 8.7% from >14%. -She has responded to low-dose basal insulin she reluctantly accepted during her last visit.    -She continues to feel comfortable taking insulin. -She was approached for slight increase in her TrTyler Aashowever she declined to make an adjustment advised to keep it at [place]  units nightly associated with strict monitoring of blood glucose 2 times a day-before breakfast and at  bedtime .  -She is encouraged to call clinic if she registers less than 70 or greater than 300 mg/dL. -She is advised to continue low-dose metformin 500 mg p.o. twice daily.    - she  admits there is a room for improvement in her diet and drink choices. -  Suggestion is made for her to avoid simple carbohydrates  from her diet including Cakes, Sweet Desserts / Pastries, Ice Cream, Soda (diet and regular),  Sweet Tea, Candies, Chips, Cookies, Sweet Pastries,  Store Bought Juices, Alcohol in Excess of  1-2 drinks a day, Artificial Sweeteners, Coffee Creamer, and "Sugar-free" Products. This will help patient to have stable blood glucose profile and potentially avoid unintended weight gain.  2. Hypercalcemia: Resolved  - Her repeat labs show normal calcium of 9.5, However her PTH remains high at 127, this combination is likely a result of CKD. - She is vitamin D replete now . -She will not need any intervention for at this time. - Her neck/thyroid ultrasound was unremarkable. -She will be considered for 24-hour urine calcium and creatinine measurement on subsequent visits.   3. Essential hypertension -she is advised to home monitor blood pressure and report if > 140/90 on 2 separate readings.  -  She is currently on lisinopril 30 mg p.o. daily at breakfast.  She has not taken her blood pressure medication this morning.   - She is hesitant to add any additional therapy, but willing to discuss this with her PMD.   4. Vitamin D deficiency: See #1 above. -She is status post therapy with vitamin D, currently at 28 ng/dL.     5. Goiter -Her thyroid ultrasound showed mild goiter with no discrete nodules. No intervention needed at this point.   - I advised patient to maintain close follow up with Vidal Schwalbe, MD for primary care needs.  - Time spent on this patient care encounter:  35 min, of which >50% was spent in  counseling and the rest reviewing her  current and  previous labs/studies ( including abstraction from other facilities),  previous treatments, her blood glucose readings, and medications' doses and developing a plan for long-term care based on the latest recommendations for standards of care; and documenting her care.  Lean M Niehoff participated in the discussions, expressed understanding, and voiced agreement with the above plans.  All questions were answered to her satisfaction. she  is encouraged to contact clinic should she have any questions or concerns prior to her return visit.   Follow up plan: Return in about 3 months (around 10/21/2019) for Bring Meter and Logs- A1c in Office, Include 8 log sheets.  Glade Lloyd, MD Phone: [phone]  Fax: [phone]  -  This note was partially dictated with voice recognition software. Similar sounding words can be transcribed inadequately or may not  be corrected upon review.  07/23/2019, 11:51 AM

## 2019-10-23 ENCOUNTER — Ambulatory Visit: Payer: Medicare HMO | Admitting: "Endocrinology

## 2019-11-10 ENCOUNTER — Ambulatory Visit: Payer: Medicare HMO | Admitting: "Endocrinology

## 2020-05-12 ENCOUNTER — Emergency Department (HOSPITAL_COMMUNITY): Payer: Medicare HMO

## 2020-05-12 ENCOUNTER — Inpatient Hospital Stay (HOSPITAL_COMMUNITY)
Admission: EM | Admit: 2020-05-12 | Discharge: 2020-05-21 | DRG: 291 | Disposition: A | Discharge status: Home Health | Payer: Medicare HMO | Attending: Internal Medicine | Admitting: Internal Medicine

## 2020-05-12 ENCOUNTER — Other Ambulatory Visit: Payer: Self-pay

## 2020-05-12 ENCOUNTER — Encounter (HOSPITAL_COMMUNITY): Payer: Self-pay

## 2020-05-12 DIAGNOSIS — E213 Hyperparathyroidism, unspecified: Secondary | ICD-10-CM | POA: Diagnosis present

## 2020-05-12 DIAGNOSIS — N184 Chronic kidney disease, stage 4 (severe): Secondary | ICD-10-CM | POA: Diagnosis present

## 2020-05-12 DIAGNOSIS — E876 Hypokalemia: Secondary | ICD-10-CM | POA: Diagnosis present

## 2020-05-12 DIAGNOSIS — I5033 Acute on chronic diastolic (congestive) heart failure: Secondary | ICD-10-CM | POA: Diagnosis present

## 2020-05-12 DIAGNOSIS — N179 Acute kidney failure, unspecified: Secondary | ICD-10-CM

## 2020-05-12 DIAGNOSIS — L600 Ingrowing nail: Secondary | ICD-10-CM

## 2020-05-12 DIAGNOSIS — IMO0002 Reserved for concepts with insufficient information to code with codable children: Secondary | ICD-10-CM | POA: Diagnosis present

## 2020-05-12 DIAGNOSIS — E782 Mixed hyperlipidemia: Secondary | ICD-10-CM | POA: Diagnosis present

## 2020-05-12 DIAGNOSIS — D509 Iron deficiency anemia, unspecified: Secondary | ICD-10-CM | POA: Diagnosis present

## 2020-05-12 DIAGNOSIS — I272 Pulmonary hypertension, unspecified: Secondary | ICD-10-CM | POA: Diagnosis present

## 2020-05-12 DIAGNOSIS — R0602 Shortness of breath: Secondary | ICD-10-CM

## 2020-05-12 DIAGNOSIS — I69354 Hemiplegia and hemiparesis following cerebral infarction affecting left non-dominant side: Secondary | ICD-10-CM

## 2020-05-12 DIAGNOSIS — Z79899 Other long term (current) drug therapy: Secondary | ICD-10-CM

## 2020-05-12 DIAGNOSIS — E1165 Type 2 diabetes mellitus with hyperglycemia: Secondary | ICD-10-CM | POA: Diagnosis present

## 2020-05-12 DIAGNOSIS — Z7982 Long term (current) use of aspirin: Secondary | ICD-10-CM

## 2020-05-12 DIAGNOSIS — E1142 Type 2 diabetes mellitus with diabetic polyneuropathy: Secondary | ICD-10-CM | POA: Diagnosis present

## 2020-05-12 DIAGNOSIS — D631 Anemia in chronic kidney disease: Secondary | ICD-10-CM | POA: Diagnosis present

## 2020-05-12 DIAGNOSIS — R7989 Other specified abnormal findings of blood chemistry: Secondary | ICD-10-CM | POA: Diagnosis present

## 2020-05-12 DIAGNOSIS — Z794 Long term (current) use of insulin: Secondary | ICD-10-CM

## 2020-05-12 DIAGNOSIS — Z9119 Patient's noncompliance with other medical treatment and regimen: Secondary | ICD-10-CM

## 2020-05-12 DIAGNOSIS — I10 Essential (primary) hypertension: Secondary | ICD-10-CM | POA: Diagnosis present

## 2020-05-12 DIAGNOSIS — I509 Heart failure, unspecified: Secondary | ICD-10-CM

## 2020-05-12 DIAGNOSIS — I130 Hypertensive heart and chronic kidney disease with heart failure and stage 1 through stage 4 chronic kidney disease, or unspecified chronic kidney disease: Secondary | ICD-10-CM | POA: Diagnosis not present

## 2020-05-12 DIAGNOSIS — I429 Cardiomyopathy, unspecified: Secondary | ICD-10-CM | POA: Diagnosis present

## 2020-05-12 DIAGNOSIS — Z20822 Contact with and (suspected) exposure to covid-19: Secondary | ICD-10-CM | POA: Diagnosis present

## 2020-05-12 DIAGNOSIS — I5043 Acute on chronic combined systolic (congestive) and diastolic (congestive) heart failure: Secondary | ICD-10-CM | POA: Diagnosis present

## 2020-05-12 DIAGNOSIS — E1122 Type 2 diabetes mellitus with diabetic chronic kidney disease: Secondary | ICD-10-CM | POA: Diagnosis present

## 2020-05-12 DIAGNOSIS — G8194 Hemiplegia, unspecified affecting left nondominant side: Secondary | ICD-10-CM | POA: Diagnosis present

## 2020-05-12 DIAGNOSIS — F0390 Unspecified dementia without behavioral disturbance: Secondary | ICD-10-CM | POA: Diagnosis present

## 2020-05-12 LAB — RESPIRATORY PANEL BY RT PCR (FLU A&B, COVID)
Influenza A by PCR: NEGATIVE
Influenza B by PCR: NEGATIVE
SARS Coronavirus 2 by RT PCR: NEGATIVE

## 2020-05-12 LAB — URINALYSIS, ROUTINE W REFLEX MICROSCOPIC
Bacteria, UA: NONE SEEN
Bilirubin Urine: NEGATIVE
Glucose, UA: >=500 mg/dL — AB
Ketones, ur: 5 mg/dL — AB
Leukocytes,Ua: NEGATIVE
Nitrite: NEGATIVE
Protein, ur: >=300 mg/dL — AB
Specific Gravity, Urine: 1.02 (ref 1.005–1.030)
pH: 6 (ref 5.0–8.0)

## 2020-05-12 LAB — CBC
HCT: 29.3 % — ABNORMAL LOW (ref 36.0–46.0)
Hemoglobin: 9.7 g/dL — ABNORMAL LOW (ref 12.0–15.0)
MCH: 28.8 pg (ref 26.0–34.0)
MCHC: 33.1 g/dL (ref 30.0–36.0)
MCV: 86.9 fL (ref 80.0–100.0)
Platelets: 375 10*3/uL (ref 150–400)
RBC: 3.37 MIL/uL — ABNORMAL LOW (ref 3.87–5.11)
RDW: 15.2 % (ref 11.5–15.5)
WBC: 8.6 10*3/uL (ref 4.0–10.5)
nRBC: 0 % (ref 0.0–0.2)

## 2020-05-12 LAB — BASIC METABOLIC PANEL
Anion gap: 13 (ref 5–15)
BUN: 37 mg/dL — ABNORMAL HIGH (ref 8–23)
CO2: 20 mmol/L — ABNORMAL LOW (ref 22–32)
Calcium: 9.2 mg/dL (ref 8.9–10.3)
Chloride: 106 mmol/L (ref 98–111)
Creatinine, Ser: 2.62 mg/dL — ABNORMAL HIGH (ref 0.44–1.00)
GFR, Estimated: 19 mL/min — ABNORMAL LOW (ref >60)
Glucose, Bld: 196 mg/dL — ABNORMAL HIGH (ref 70–99)
Potassium: 2.6 mmol/L — CL (ref 3.5–5.1)
Sodium: 139 mmol/L (ref 135–145)

## 2020-05-12 LAB — CBG MONITORING, ED: Glucose-Capillary: 190 mg/dL — ABNORMAL HIGH (ref 70–99)

## 2020-05-12 LAB — MAGNESIUM: Magnesium: 1.6 mg/dL — ABNORMAL LOW (ref 1.7–2.4)

## 2020-05-12 LAB — TROPONIN I (HIGH SENSITIVITY): Troponin I (High Sensitivity): 12 ng/L (ref <18)

## 2020-05-12 LAB — BRAIN NATRIURETIC PEPTIDE: B Natriuretic Peptide: 1352 pg/mL — ABNORMAL HIGH (ref 0.0–100.0)

## 2020-05-12 MED ORDER — METOPROLOL TARTRATE 5 MG/5ML IV SOLN
5.0000 mg | Freq: Once | INTRAVENOUS | Status: AC
  Administered 2020-05-12: 5 mg via INTRAVENOUS
  Filled 2020-05-12: qty 5

## 2020-05-12 MED ORDER — POTASSIUM CHLORIDE 10 MEQ/100ML IV SOLN
10.0000 meq | INTRAVENOUS | Status: AC
  Administered 2020-05-12 (×3): 10 meq via INTRAVENOUS
  Filled 2020-05-12 (×3): qty 100

## 2020-05-12 MED ORDER — SODIUM CHLORIDE 0.9 % IV BOLUS
250.0000 mL | Freq: Once | INTRAVENOUS | Status: AC
  Administered 2020-05-12: 250 mL via INTRAVENOUS

## 2020-05-12 MED ORDER — MAGNESIUM SULFATE 2 GM/50ML IV SOLN
2.0000 g | Freq: Once | INTRAVENOUS | Status: AC
  Administered 2020-05-13: 2 g via INTRAVENOUS
  Filled 2020-05-12: qty 50

## 2020-05-12 MED ORDER — FUROSEMIDE 10 MG/ML IJ SOLN
20.0000 mg | Freq: Once | INTRAMUSCULAR | Status: AC
  Administered 2020-05-12: 20 mg via INTRAVENOUS
  Filled 2020-05-12: qty 2

## 2020-05-12 MED ORDER — POTASSIUM CHLORIDE CRYS ER 20 MEQ PO TBCR
40.0000 meq | EXTENDED_RELEASE_TABLET | Freq: Once | ORAL | Status: AC
  Administered 2020-05-12: 40 meq via ORAL
  Filled 2020-05-12: qty 2

## 2020-05-12 NOTE — ED Notes (Signed)
CRITICAL VALUE ALERT  Critical Value:  K 2.6  Date & Time Notied:  3790 10/27  Provider Notified: haviland  Orders Received/Actions taken:

## 2020-05-12 NOTE — ED Triage Notes (Addendum)
Pt to er, pt states that she got dizzy and fell, pt now c/o knee pain.  Pt states that she has fallen before, denies chest pain.  Pt states that she has a hx of a stroke, states that she has some weakness left over from this.  Denies new weakness

## 2020-05-12 NOTE — ED Provider Notes (Signed)
[facility] Provider Note   CSN: [id number] Arrival date & time: 05/12/20  1555     History Chief Complaint  Patient presents with  . Fall    Maria Parrish is a 74 y.o. female presenting for evaluation of dizziness.  Level V caveat due to dementia.   Patient states she has been feeling foggy headed.  He states 2 days ago, she felt short of breath, but this is since resolved.  She does report one episode of emesis the day before yesterday.  Patient denies fall, however per triage note, patient reported she got dizzy and fell.  Patient denying headache, fever, chest pain, current shortness of breath, cough, nausea, vomiting, abdominal pain, abnormal bowel movements.  She does report urinary frequency, but no dysuria or hematuria.  Patient states she lives at home with her son.  Uses a cane to walk intermittently.  Additional history from chart review.  Patient with a history of dementia, diabetes, lipidemia, hypertension, noncompliance, history of CVA    HPI     Past Medical History:  Diagnosis Date  . Assistance needed for mobility    walks with walker  . Confusion   . Dementia ([place])   . Diabetes mellitus without complication ([place])   . Generalized weakness   . Hypercalcemia   . Hyperlipidemia   . Hyperparathyroidism ([place])   . Hypertension   . Hypertension   . Moderate protein-calorie malnutrition ([place])   . Peripheral neuropathy   . UTI (lower urinary tract infection)     Patient Active Problem List   Diagnosis Date Noted  . Personal history of noncompliance with medical treatment, presenting hazards to health 12/17/2017  . Hyperparathyroidism ([place]) 10/19/2016  . Vitamin D deficiency 01/12/2016  . Altered mental status 09/22/2015  . 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 Acute encephalopathy 09/22/2015  . Palliative care encounter   . UTI (urinary tract infection) 09/14/2015  . Dementia ([place]) 09/14/2015  . General weakness 09/14/2015  .  Subacute confusional state 09/14/2015  . Uncontrolled type 2 diabetes mellitus with complication, without long-term current use of insulin ([place]) 09/14/2015  . Confusion 09/14/2015  . Fall at home   . Elevated parathyroid hormone 09/09/2015  . Mucosal abnormality of stomach   . Diverticulosis of colon without hemorrhage   . Hypercalcemia 08/10/2015  . Goiter 08/10/2015  . Abnormal weight loss 07/28/2015  . Neuropathy 03/26/2015  . Hypokalemia 03/26/2015  . Essential hypertension, benign 03/26/2015  . Mixed hyperlipidemia 03/26/2015  . Malnutrition of moderate degree ([place]) 03/26/2015    Past Surgical History:  Procedure Laterality Date  . COLONOSCOPY N/A 08/12/2015   Procedure: COLONOSCOPY;  Surgeon: Daneil Dolin, MD;  [facility];  Service: Endoscopy;  Laterality: N/A;  1245pm  . ESOPHAGOGASTRODUODENOSCOPY N/A 08/12/2015   Procedure: ESOPHAGOGASTRODUODENOSCOPY (EGD);  Surgeon: Daneil Dolin, MD;  [facility];  Service: Endoscopy;  Laterality: N/A;  . none       OB History   No obstetric history on file.     Family History  Problem Relation Age of Onset  . Hypertension Brother   . Hypertension Brother   . Diabetes Brother   . Diabetes Father   . Heart attack Mother   . Colon cancer Neg Hx     Social History   Tobacco Use  . Smoking status: Never Smoker  . Smokeless tobacco: Never Used  Vaping Use  . Vaping Use: Never used  Substance Use Topics  .  Alcohol use: No  . Drug use: No    Home Medications Prior to Admission medications   Medication Sig Start Date End Date Taking? Authorizing Provider  amLODipine (NORVASC) 5 MG tablet Take by mouth.   Yes [provider]  aspirin EC 81 MG tablet Take 81 mg by mouth daily.   Yes [provider]  atorvastatin (LIPITOR) 40 MG tablet Take 40 mg by mouth daily.   Yes [provider]  ezetimibe (ZETIA) 10 MG tablet daily. 01/28/18  Yes [provider]  FEROSUL 325  (65 Fe) MG tablet Take 325 mg by mouth 2 (two) times daily. 02/08/20  Yes [provider]  gabapentin (NEURONTIN) 300 MG capsule Take 300 mg by mouth 2 (two) times daily. 08/19/18  Yes [provider]  insulin degludec (TRESIBA FLEXTOUCH) 100 UNIT/ML SOPN FlexTouch Pen Inject 0.22 mLs (22 Units total) into the skin daily. 07/14/19  Yes Nida, Marella Chimes, MD  metoprolol tartrate (LOPRESSOR) 25 MG tablet Take 25 mg by mouth 2 (two) times daily. 05/07/20  Yes [provider]  olmesartan-hydrochlorothiazide (BENICAR HCT) 40-25 MG tablet daily. 05/03/18  Yes [provider]  polyethylene glycol (MIRALAX / GLYCOLAX) packet Take 17 g by mouth daily as needed for mild constipation. 09/19/15  Yes Nita Sells, MD  potassium chloride (KLOR-CON) 10 MEQ tablet Take by mouth.   Yes [provider]  spironolactone (ALDACTONE) 25 MG tablet Take by mouth. 01/08/20  Yes [provider]  Vitamin D, Ergocalciferol, (DRISDOL) 50000 units CAPS capsule Take 1 capsule (50,000 Units total) by mouth every 14 (fourteen) days. 02/26/17  Yes Nida, Marella Chimes, MD  Blood Glucose Monitoring Suppl (ACCU-CHEK GUIDE ME) w/Device KIT 1 Piece by Does not apply route as directed. 09/02/18   Cassandria Anger, MD  Cyanocobalamin (VITAMIN B-12 PO) Take 1 tablet by mouth daily.    [provider]  folic acid (FOLVITE) 1 MG tablet Take 1 tablet (1 mg total) by mouth daily. Patient not taking: Reported on 05/12/2020 09/19/15   Nita Sells, MD  glucose blood (ACCU-CHEK GUIDE) test strip Use as instructed 4 x daily. E11.65 09/03/18   Cassandria Anger, MD  Insulin Pen Needle (B-D ULTRAFINE III SHORT PEN) 31G X 8 MM MISC 1 each by Does not apply route as directed. 09/02/18   Cassandria Anger, MD  metFORMIN (GLUCOPHAGE) 500 MG tablet Take 1 tablet (500 mg total) by mouth 2 (two) times daily with a meal. Patient not taking: Reported on 05/12/2020 09/02/18    Cassandria Anger, MD    Allergies    Penicillins and Statins  Review of Systems   Review of Systems  Unable to perform ROS: Dementia  Genitourinary: Positive for frequency.    Physical Exam Updated Vital Signs BP (!) 192/108 (BP Location: Right Arm)   Pulse 81   Temp 98.5 F (36.9 C) (Oral)   Resp 17   Ht _0  (1.626 m)   Wt 70.3 kg   SpO2 99%   BMI 26.61 kg/m   Physical Exam Vitals and nursing note reviewed.  Constitutional:      General: She is not in acute distress.    Appearance: She is well-developed.     Comments: Pleasantly confused, in no acute distress  HENT:     Head: Normocephalic and atraumatic.  Eyes:     Conjunctiva/sclera: Conjunctivae normal.     Pupils: Pupils are equal, round, and reactive to light.  Cardiovascular:  Rate and Rhythm: Normal rate and regular rhythm.     Pulses: Normal pulses.  Pulmonary:     Effort: Pulmonary effort is normal. No respiratory distress.     Breath sounds: Examination of the right-lower field reveals decreased breath sounds. Examination of the left-lower field reveals decreased breath sounds. Decreased breath sounds present. No wheezing.     Comments: Diminished lung sounds in bilateral lower bases.  Speaking in full sentences.  Sats stable on room air. Abdominal:     General: There is no distension.     Palpations: Abdomen is soft. There is no mass.     Tenderness: There is no abdominal tenderness. There is no guarding or rebound.  Musculoskeletal:        General: Normal range of motion.     Cervical back: Normal range of motion and neck supple.     Right lower leg: Edema present.     Left lower leg: Edema present.     Comments: 1-2+ pitting edema bilaterally.  Skin:    General: Skin is warm and dry.     Capillary Refill: Capillary refill takes less than 2 seconds.  Neurological:     Mental Status: She is alert.     Comments: Oriented to person     ED Results / Procedures / Treatments   Labs (all  labs ordered are listed, but only abnormal results are displayed) Labs Reviewed  BASIC METABOLIC PANEL - Abnormal; Notable for the following components:      Result Value   Potassium 2.6 (*)    CO2 20 (*)    Glucose, Bld 196 (*)    BUN 37 (*)    Creatinine, Ser 2.62 (*)    GFR, Estimated 19 (*)    All other components within normal limits  CBC - Abnormal; Notable for the following components:   RBC 3.37 (*)    Hemoglobin 9.7 (*)    HCT 29.3 (*)    All other components within normal limits  URINALYSIS, ROUTINE W REFLEX MICROSCOPIC - Abnormal; Notable for the following components:   Glucose, UA >=500 (*)    Hgb urine dipstick MODERATE (*)    Ketones, ur 5 (*)    Protein, ur >=300 (*)    All other components within normal limits  BRAIN NATRIURETIC PEPTIDE - Abnormal; Notable for the following components:   B Natriuretic Peptide 1,352.0 (*)    All other components within normal limits  CBG MONITORING, ED - Abnormal; Notable for the following components:   Glucose-Capillary 190 (*)    All other components within normal limits  RESPIRATORY PANEL BY RT PCR (FLU A&B, COVID)  URINE CULTURE  MAGNESIUM  TROPONIN I (HIGH SENSITIVITY)    EKG None  Radiology DG Chest Port 1 View  Result Date: 05/12/2020 CLINICAL DATA:  Shortness of breath EXAM: PORTABLE CHEST 1 VIEW COMPARISON:  May 01, 2017 FINDINGS: The heart size and mediastinal contours are within normal limits. There is prominence of the central pulmonary vasculature. Aortic knob calcifications are seen. No acute osseous abnormality. IMPRESSION: Pulmonary vascular congestion. Electronically Signed   By: Prudencio Pair M.D.   On: 05/12/2020 20:53    Procedures Procedures (including critical care time)  Medications Ordered in ED Medications  potassium chloride 10 mEq in 100 mL IVPB (10 mEq Intravenous New Bag/Given 05/12/20 2139)  furosemide (LASIX) injection 20 mg (has no administration in time range)  potassium chloride SA  (KLOR-CON) CR tablet 40 mEq (has no administration in  time range)  sodium chloride 0.9 % bolus 250 mL (0 mLs Intravenous Stopped 05/12/20 2053)  metoprolol tartrate (LOPRESSOR) injection 5 mg (5 mg Intravenous Given 05/12/20 2040)    ED Course  I have reviewed the triage vital signs and the nursing notes.  Pertinent labs & imaging results that were available during my care of the patient were reviewed by me and considered in my medical decision making (see chart for details).    MDM Rules/Calculators/A&P                          Patient presenting for evaluation of dizziness.  On exam, patient appears nontoxic.  She is confused, however clinically vomiting in the setting of diagnosis of dementia.  Labs obtained from triage concerning for hypokalemia 2.6.  Patient also with AKI with a creatinine of 2.62.  As patient is having urinary frequency, will order UA.  Patient reports shortness of breath 1 days ago, and has diminished lung sounds on my exam with pitting edema.  We will add on BNP and chest x-ray.  Patient will need to be admitted for rehydration and electrolyte management. Case discussed with attending, Dr. Gilford Raid evaluated the pt.   Urine without signs of infection.  BNP elevated greater than 1000, chest x-ray with pulmonary vascular congestion.  Concern for new onset heart failure.  Will give a small dose of Lasix.   Discussed with Dr. Olevia Bowens from triad hospitalist service, pt to be admitted.   Final Clinical Impression(s) / ED Diagnoses Final diagnoses:  New onset of congestive heart failure ([place])  Hypokalemia  AKI (acute kidney injury) [facility])    Rx / DC Orders ED Discharge Orders    None       Franchot Heidelberg, PA-C 05/12/20 2234    Isla Pence, MD 05/12/20 2243

## 2020-05-12 NOTE — H&P (Signed)
History and Physical    Maria Parrish QIH:[id number] DOB: [birth year] DOA: 05/12/2020  PCP: Vidal Schwalbe, MD  Patient coming from: Home.  I have personally briefly reviewed patient's old medical records in [place]  Chief Complaint: Fall.  HPI: Maria Parrish is a 74 y.o. female with medical history significant of difficulty ambulating, history of generalized weakness, confusion, history of dementia, type 2 diabetes, history of hypercalcemia, hyperparathyroidism, hypertension, hyperlipidemia, peripheral neuropathy, history of UTI, history of moderate protein calorie malnutrition who is coming to the emergency department after having a fall at home.  She is disoriented to time, date, situation and place.  She thinks she saw her brother's house.  She is only oriented to name and unable to provide further history.  ED Course: Initial vital signs were temperature 98.5 F, pulse 90, respiration 18, blood pressure 193/112 mmHg and O2 sat 97% on room air.  Patient received 250 mL of NS bolus, KCl 10 mEq IVP x3, around 20 mg of furosemide IVP x1 and metoprolol.  CBC shows white count 8.6, hemoglobin 9.7 g/dL platelets 375.  BNP was 1352.0 pg/mL.  Potassium is 2.6 and CO2 20 mmol/L.  Glucose 196, BUN 37 and creatinine 2.62 mg/dL.  Creatinine level in December was 1.51 mg/dL. Urinalysis showed glucosuria of more than 500, ketonuria 5 and proteinuria more than 300 mg/dL.  There was moderate hemoglobinuria on chemical exam with 0-5 RBC on microscopic examination.  Coronavirus and influenza PCR was negative.  Troponin x2 was negative.  Magnesium was 1.6 mg/dL.  Her chest radiograph shows pulmonary vascular congestion.  Review of Systems: As per HPI otherwise all other systems reviewed and are negative.  Past Medical History:  Diagnosis Date  . Assistance needed for mobility    walks with walker  . Confusion   . Dementia ([place])   . Diabetes mellitus without complication ([place])   . Generalized  weakness   . Hypercalcemia   . Hyperlipidemia   . Hyperparathyroidism ([place])   . Hypertension   . Hypertension   . Moderate protein-calorie malnutrition ([place])   . Peripheral neuropathy   . UTI (lower urinary tract infection)     Past Surgical History:  Procedure Laterality Date  . COLONOSCOPY N/A 08/12/2015   Procedure: COLONOSCOPY;  Surgeon: Daneil Dolin, MD;  Location: [facility];  Service: Endoscopy;  Laterality: N/A;  1245pm  . ESOPHAGOGASTRODUODENOSCOPY N/A 08/12/2015   Procedure: ESOPHAGOGASTRODUODENOSCOPY (EGD);  Surgeon: Daneil Dolin, MD;  Location: [facility];  Service: Endoscopy;  Laterality: N/A;  . none      Social History  reports that she has never smoked. She has never used smokeless tobacco. She reports that she does not drink alcohol and does not use drugs.  Allergies  Allergen Reactions  . Penicillins Other (See Comments)    Makes patient sore.  Has patient had a PCN reaction causing immediate rash, facial/tongue/throat swelling, SOB or lightheadedness with hypotension: No Has patient had a PCN reaction causing severe rash involving mucus membranes or skin necrosis: No Has patient had a PCN reaction that required hospitalization No Has patient had a PCN reaction occurring within the last 10 years: No If all of the above answers are "NO", then may proceed with Cephalosporin use.   . Statins Other (See Comments)    Makes wrists and fingers ache    Family History  Problem Relation Age of Onset  . Hypertension Brother   . Hypertension Brother   . Diabetes Brother   .  Diabetes Father   . Heart attack Mother   . Colon cancer Neg Hx    Prior to Admission medications   Medication Sig Start Date End Date Taking? Authorizing Provider  amLODipine (NORVASC) 5 MG tablet Take by mouth.   Yes [provider]  aspirin EC 81 MG tablet Take 81 mg by mouth daily.   Yes [provider]  atorvastatin (LIPITOR) 40 MG tablet Take 40 mg by mouth  daily.   Yes [provider]  ezetimibe (ZETIA) 10 MG tablet daily. 01/28/18  Yes [provider]  FEROSUL 325 (65 Fe) MG tablet Take 325 mg by mouth 2 (two) times daily. 02/08/20  Yes [provider]  gabapentin (NEURONTIN) 300 MG capsule Take 300 mg by mouth 2 (two) times daily. 08/19/18  Yes [provider]  insulin degludec (TRESIBA FLEXTOUCH) 100 UNIT/ML SOPN FlexTouch Pen Inject 0.22 mLs (22 Units total) into the skin daily. 07/14/19  Yes Nida, Marella Chimes, MD  metoprolol tartrate (LOPRESSOR) 25 MG tablet Take 25 mg by mouth 2 (two) times daily. 05/07/20  Yes [provider]  olmesartan-hydrochlorothiazide (BENICAR HCT) 40-25 MG tablet daily. 05/03/18  Yes [provider]  polyethylene glycol (MIRALAX / GLYCOLAX) packet Take 17 g by mouth daily as needed for mild constipation. 09/19/15  Yes Nita Sells, MD  potassium chloride (KLOR-CON) 10 MEQ tablet Take by mouth.   Yes [provider]  spironolactone (ALDACTONE) 25 MG tablet Take by mouth. 01/08/20  Yes [provider]  Vitamin D, Ergocalciferol, (DRISDOL) 50000 units CAPS capsule Take 1 capsule (50,000 Units total) by mouth every 14 (fourteen) days. 02/26/17  Yes Nida, Marella Chimes, MD  Blood Glucose Monitoring Suppl (ACCU-CHEK GUIDE ME) w/Device KIT 1 Piece by Does not apply route as directed. 09/02/18   Cassandria Anger, MD  Cyanocobalamin (VITAMIN B-12 PO) Take 1 tablet by mouth daily.    [provider]  folic acid (FOLVITE) 1 MG tablet Take 1 tablet (1 mg total) by mouth daily. Patient not taking: Reported on 05/12/2020 09/19/15   Nita Sells, MD  glucose blood (ACCU-CHEK GUIDE) test strip Use as instructed 4 x daily. E11.65 09/03/18   Cassandria Anger, MD  Insulin Pen Needle (B-D ULTRAFINE III SHORT PEN) 31G X 8 MM MISC 1 each by Does not apply route as directed. 09/02/18   Cassandria Anger, MD  metFORMIN (GLUCOPHAGE) 500 MG  tablet Take 1 tablet (500 mg total) by mouth 2 (two) times daily with a meal. Patient not taking: Reported on 05/12/2020 09/02/18   Cassandria Anger, MD    Physical Exam: Vitals:   05/12/20 2045 05/12/20 2100 05/12/20 2130 05/12/20 2300  BP: (!) 217/118 (!) 183/97 (!) 192/108 (!) 205/108  Pulse: 80 68 81 79  Resp: '18 18 17 ' (!) 28  Temp:      TempSrc:      SpO2: 97% 99% 99% 98%  Weight:      Height:        Constitutional: Frail, but in NAD. Eyes: PERRL, lids and conjunctivae normal ENMT: Mucous membranes are moist. Posterior pharynx clear of any exudate or lesions. Neck: normal, supple, no masses, no thyromegaly Respiratory: No wheezing, bibasilar crackles. Normal respiratory effort. No accessory muscle use.  Cardiovascular: Regular rate and rhythm, no murmurs / rubs / gallops.  2+ bilateral lower extremity edema. 2+ pedal pulses. No carotid bruits.  Abdomen: Nondistended.  BS positive.  Soft, no tenderness, no masses palpated. No hepatosplenomegaly. Musculoskeletal: no  clubbing / cyanosis.  Good ROM, no contractures. Normal muscle tone.  Skin: no rashes, lesions, ulcers on very limited dermatological examination. Neurologic: CN 2-12 grossly intact. Sensation intact, DTR normal. Strength 5/5 in all 4.  Psychiatric: Alert and oriented x 1, disoriented to time, date, place and situation.  Labs on Admission: I have personally reviewed following labs and imaging studies  CBC: Recent Labs  Lab 05/12/20 1652  WBC 8.6  HGB 9.7*  HCT 29.3*  MCV 86.9  PLT 009    Basic Metabolic Panel: Recent Labs  Lab 05/12/20 1652  NA 139  K 2.6*  CL 106  CO2 20*  GLUCOSE 196*  BUN 37*  CREATININE 2.62*  CALCIUM 9.2  MG 1.6*    GFR: Estimated Creatinine Clearance: 18.4 mL/min (A) (by C-G formula based on SCr of 2.62 mg/dL (H)).  Liver Function Tests: No results for input(s): AST, ALT, ALKPHOS, BILITOT, PROT, ALBUMIN in the last 168 hours.  Radiological Exams on Admission: DG  Chest Port 1 View  Result Date: 05/12/2020 CLINICAL DATA:  Shortness of breath EXAM: PORTABLE CHEST 1 VIEW COMPARISON:  May 01, 2017 FINDINGS: The heart size and mediastinal contours are within normal limits. There is prominence of the central pulmonary vasculature. Aortic knob calcifications are seen. No acute osseous abnormality. IMPRESSION: Pulmonary vascular congestion. Electronically Signed   By: Prudencio Pair M.D.   On: 05/12/2020 20:53   March 2017 echocardiogram -------------------------------------------------------------------  LV EF: 55% -  60%   -------------------------------------------------------------------  Indications:   Stroke 434.91.   -------------------------------------------------------------------  History:  PMH: Altered mental status, Dementia. Risk factors:  Hypertension. Diabetes mellitus. Dyslipidemia.   -------------------------------------------------------------------  Study Conclusions   - Left ventricle: The cavity size was normal. Wall thickness was  normal. Systolic function was normal. The estimated ejection  fraction was in the range of 55% to 60%. Wall motion was normal;  there were no regional wall motion abnormalities. Doppler  parameters are consistent with abnormal left ventricular  relaxation (grade 1 diastolic dysfunction).  - Aortic valve: Valve area (VTI): 2.09 cm^2. Valve area (Vmax):  2.04 cm^2.  - Mitral valve: There was mild regurgitation.  - Left atrium: The atrium was mildly dilated.  - Right atrium: The atrium was mildly dilated.  - Pulmonary arteries: Systolic pressure was mildly increased. PA  peak pressure: 34 mm Hg (S).  EKG: Independently reviewed. Vent. rate 87 BPM PR interval 120 ms QRS duration 62 ms QT/QTc 342/411 ms P-R-T axes 93 -12 -19 Sinus rhythm with marked sinus arrhythmia with occasional Premature ventricular complexes Minimal voltage criteria for LVH, may be normal variant ( R in  aVL ) Anterior infarct , age undetermined Abnormal ECG  Assessment/Plan Principal Problem:   Acute on chronic diastolic congestive heart failure (HCC) Observation/telemetry. Continue supplemental oxygen. Sodium and fluid restriction. Monitor daily weights, intake and output. Monitor renal function electrolytes. Continue furosemide if renal function allows. Check echocardiogram.  Active Problems:   Elevated serum creatinine Gradual change versus AKI. Hold Benicar HCT and spironolactone for now.    Uncontrolled type 2 diabetes mellitus (HCC) Carbohydrate modified diet. CBG monitoring with RI SS.    Hypokalemia Replacement given. Monitor potassium level.    Essential hypertension, benign Continue metoprolol 25 mg p.o. twice daily. Hold diuretics and ARB. Monitor BP, HR, renal function electrolytes.    Mixed hyperlipidemia Continue atorvastatin 40 mg p.o. daily. Continue Zetia 10 mg p.o. daily.    Dementia ([place]) Supportive care. Consult TOC team.    Left  hemiparesis ([place]) Supportive care. Consider PT evaluation.       DVT prophylaxis: Lovenox SQ renal dose. Code Status:   Full code. Family Communication: Disposition Plan:   Patient is from:  Home.  Anticipated DC to:  Home.  Anticipated DC date:  05/13/2020 or 05/14/2020.Marland Kitchen  Anticipated DC barriers: Clinical status. Consults called: Admission status:  Observation/telemetry.  Severity of Illness:  High due to history of fall in the setting of dizziness, exacerbation of a chronic diastolic CHF with multiple electrolyte abnormalities.  Reubin Milan MD [facility]  How to contact the [facility] Attending or Consulting provider [place] or covering provider during after hours [place], for this patient?   1. Check the care team in [facility] and look for a) attending/consulting TRH provider listed and b) the [facility] team listed 2. Log into [email or web address] and use [place]'s universal password to access. If you do  not have the password, please contact the hospital operator. 3. Locate the [facility] provider you are looking for under [facility] and page to a number that you can be directly reached. 4. If you still have difficulty reaching the provider, please page the [facility] (Director on Call) for the Hospitalists listed on amion for assistance.  05/12/2020, 11:50 PM   This document was prepared using Dragon voice recognition software and may contain some unintended transcription errors.

## 2020-05-13 ENCOUNTER — Observation Stay (HOSPITAL_BASED_OUTPATIENT_CLINIC_OR_DEPARTMENT_OTHER): Payer: Medicare HMO

## 2020-05-13 DIAGNOSIS — E782 Mixed hyperlipidemia: Secondary | ICD-10-CM | POA: Diagnosis present

## 2020-05-13 DIAGNOSIS — I429 Cardiomyopathy, unspecified: Secondary | ICD-10-CM | POA: Diagnosis present

## 2020-05-13 DIAGNOSIS — Z7982 Long term (current) use of aspirin: Secondary | ICD-10-CM | POA: Diagnosis not present

## 2020-05-13 DIAGNOSIS — E1165 Type 2 diabetes mellitus with hyperglycemia: Secondary | ICD-10-CM

## 2020-05-13 DIAGNOSIS — E1142 Type 2 diabetes mellitus with diabetic polyneuropathy: Secondary | ICD-10-CM | POA: Diagnosis present

## 2020-05-13 DIAGNOSIS — I272 Pulmonary hypertension, unspecified: Secondary | ICD-10-CM | POA: Diagnosis present

## 2020-05-13 DIAGNOSIS — D509 Iron deficiency anemia, unspecified: Secondary | ICD-10-CM | POA: Diagnosis present

## 2020-05-13 DIAGNOSIS — E876 Hypokalemia: Secondary | ICD-10-CM | POA: Diagnosis present

## 2020-05-13 DIAGNOSIS — N179 Acute kidney failure, unspecified: Secondary | ICD-10-CM | POA: Diagnosis present

## 2020-05-13 DIAGNOSIS — Z794 Long term (current) use of insulin: Secondary | ICD-10-CM | POA: Diagnosis not present

## 2020-05-13 DIAGNOSIS — F0390 Unspecified dementia without behavioral disturbance: Secondary | ICD-10-CM | POA: Diagnosis present

## 2020-05-13 DIAGNOSIS — D631 Anemia in chronic kidney disease: Secondary | ICD-10-CM | POA: Diagnosis present

## 2020-05-13 DIAGNOSIS — E213 Hyperparathyroidism, unspecified: Secondary | ICD-10-CM | POA: Diagnosis present

## 2020-05-13 DIAGNOSIS — I10 Essential (primary) hypertension: Secondary | ICD-10-CM | POA: Diagnosis not present

## 2020-05-13 DIAGNOSIS — N184 Chronic kidney disease, stage 4 (severe): Secondary | ICD-10-CM | POA: Diagnosis present

## 2020-05-13 DIAGNOSIS — I130 Hypertensive heart and chronic kidney disease with heart failure and stage 1 through stage 4 chronic kidney disease, or unspecified chronic kidney disease: Secondary | ICD-10-CM | POA: Diagnosis present

## 2020-05-13 DIAGNOSIS — I5031 Acute diastolic (congestive) heart failure: Secondary | ICD-10-CM | POA: Diagnosis not present

## 2020-05-13 DIAGNOSIS — N1832 Chronic kidney disease, stage 3b: Secondary | ICD-10-CM | POA: Diagnosis not present

## 2020-05-13 DIAGNOSIS — Z79899 Other long term (current) drug therapy: Secondary | ICD-10-CM | POA: Diagnosis not present

## 2020-05-13 DIAGNOSIS — Z9119 Patient's noncompliance with other medical treatment and regimen: Secondary | ICD-10-CM | POA: Diagnosis not present

## 2020-05-13 DIAGNOSIS — I5043 Acute on chronic combined systolic (congestive) and diastolic (congestive) heart failure: Secondary | ICD-10-CM | POA: Diagnosis present

## 2020-05-13 DIAGNOSIS — E119 Type 2 diabetes mellitus without complications: Secondary | ICD-10-CM | POA: Diagnosis not present

## 2020-05-13 DIAGNOSIS — I5033 Acute on chronic diastolic (congestive) heart failure: Secondary | ICD-10-CM | POA: Diagnosis not present

## 2020-05-13 DIAGNOSIS — I5041 Acute combined systolic (congestive) and diastolic (congestive) heart failure: Secondary | ICD-10-CM | POA: Diagnosis not present

## 2020-05-13 DIAGNOSIS — Z20822 Contact with and (suspected) exposure to covid-19: Secondary | ICD-10-CM | POA: Diagnosis present

## 2020-05-13 DIAGNOSIS — E1122 Type 2 diabetes mellitus with diabetic chronic kidney disease: Secondary | ICD-10-CM | POA: Diagnosis present

## 2020-05-13 DIAGNOSIS — I69354 Hemiplegia and hemiparesis following cerebral infarction affecting left non-dominant side: Secondary | ICD-10-CM | POA: Diagnosis not present

## 2020-05-13 LAB — BASIC METABOLIC PANEL
Anion gap: 14 (ref 5–15)
BUN: 22 mg/dL (ref 8–23)
CO2: 25 mmol/L (ref 22–32)
Calcium: 9 mg/dL (ref 8.9–10.3)
Chloride: 95 mmol/L — ABNORMAL LOW (ref 98–111)
Creatinine, Ser: 8.64 mg/dL — ABNORMAL HIGH (ref 0.44–1.00)
GFR, Estimated: 4 mL/min — ABNORMAL LOW (ref >60)
Glucose, Bld: 100 mg/dL — ABNORMAL HIGH (ref 70–99)
Potassium: 3.9 mmol/L (ref 3.5–5.1)
Sodium: 134 mmol/L — ABNORMAL LOW (ref 135–145)

## 2020-05-13 LAB — TROPONIN I (HIGH SENSITIVITY): Troponin I (High Sensitivity): 13 ng/L (ref <18)

## 2020-05-13 LAB — GLUCOSE, CAPILLARY
Glucose-Capillary: 204 mg/dL — ABNORMAL HIGH (ref 70–99)
Glucose-Capillary: 282 mg/dL — ABNORMAL HIGH (ref 70–99)
Glucose-Capillary: 167 mg/dL — ABNORMAL HIGH (ref 70–99)
Glucose-Capillary: 220 mg/dL — ABNORMAL HIGH (ref 70–99)

## 2020-05-13 LAB — CBC
HCT: 29.6 % — ABNORMAL LOW (ref 36.0–46.0)
Hemoglobin: 9.5 g/dL — ABNORMAL LOW (ref 12.0–15.0)
MCH: 28.9 pg (ref 26.0–34.0)
MCHC: 32.1 g/dL (ref 30.0–36.0)
MCV: 90 fL (ref 80.0–100.0)
Platelets: 371 10*3/uL (ref 150–400)
RBC: 3.29 MIL/uL — ABNORMAL LOW (ref 3.87–5.11)
RDW: 15.3 % (ref 11.5–15.5)
WBC: 8.1 10*3/uL (ref 4.0–10.5)
nRBC: 0 % (ref 0.0–0.2)

## 2020-05-13 LAB — HEPATIC FUNCTION PANEL
ALT: 50 U/L — ABNORMAL HIGH (ref 0–44)
AST: 34 U/L (ref 15–41)
Albumin: 3.2 g/dL — ABNORMAL LOW (ref 3.5–5.0)
Alkaline Phosphatase: 71 U/L (ref 38–126)
Bilirubin, Direct: 0.2 mg/dL (ref 0.0–0.2)
Indirect Bilirubin: 0.7 mg/dL (ref 0.3–0.9)
Total Bilirubin: 0.9 mg/dL (ref 0.3–1.2)
Total Protein: 7.4 g/dL (ref 6.5–8.1)

## 2020-05-13 LAB — ECHOCARDIOGRAM COMPLETE
Area-P 1/2: 4.83 cm2
Height: 64 in
MV M vel: 5.28 m/s
MV Peak grad: 111.5 mmHg
S' Lateral: 4.1 cm
Weight: 2480 oz

## 2020-05-13 MED ORDER — AMLODIPINE BESYLATE 5 MG PO TABS
5.0000 mg | ORAL_TABLET | Freq: Once | ORAL | Status: AC
  Administered 2020-05-13: 5 mg via ORAL
  Filled 2020-05-13 (×2): qty 1

## 2020-05-13 MED ORDER — ONDANSETRON HCL 4 MG PO TABS: 4.0000 mg | ORAL_TABLET | Freq: Four times a day (QID) | ORAL | Status: DC | PRN

## 2020-05-13 MED ORDER — ACETAMINOPHEN 325 MG PO TABS: 650.0000 mg | ORAL_TABLET | Freq: Four times a day (QID) | ORAL | Status: DC | PRN

## 2020-05-13 MED ORDER — EZETIMIBE 10 MG PO TABS
10.0000 mg | ORAL_TABLET | Freq: Every day | ORAL | Status: DC
  Administered 2020-05-13 – 2020-05-21 (×9): 10 mg via ORAL
  Filled 2020-05-13 (×11): qty 1

## 2020-05-13 MED ORDER — ACETAMINOPHEN 650 MG RE SUPP: 650.0000 mg | Freq: Four times a day (QID) | RECTAL | Status: DC | PRN

## 2020-05-13 MED ORDER — METOPROLOL TARTRATE 25 MG PO TABS
25.0000 mg | ORAL_TABLET | Freq: Two times a day (BID) | ORAL | Status: AC
  Administered 2020-05-13 – 2020-05-17 (×11): 25 mg via ORAL
  Filled 2020-05-13 (×11): qty 1

## 2020-05-13 MED ORDER — ATORVASTATIN CALCIUM 40 MG PO TABS
40.0000 mg | ORAL_TABLET | Freq: Every day | ORAL | Status: DC
  Administered 2020-05-13 – 2020-05-21 (×9): 40 mg via ORAL
  Filled 2020-05-13 (×9): qty 1

## 2020-05-13 MED ORDER — POLYETHYLENE GLYCOL 3350 17 G PO PACK: 17.0000 g | PACK | Freq: Every day | ORAL | Status: DC | PRN

## 2020-05-13 MED ORDER — ASPIRIN EC 81 MG PO TBEC
81.0000 mg | DELAYED_RELEASE_TABLET | Freq: Every day | ORAL | Status: DC
  Administered 2020-05-13 – 2020-05-21 (×9): 81 mg via ORAL
  Filled 2020-05-13 (×9): qty 1

## 2020-05-13 MED ORDER — ENOXAPARIN SODIUM 30 MG/0.3ML ~~LOC~~ SOLN
30.0000 mg | SUBCUTANEOUS | Status: DC
  Administered 2020-05-13 – 2020-05-21 (×9): 30 mg via SUBCUTANEOUS
  Filled 2020-05-13 (×9): qty 0.3

## 2020-05-13 MED ORDER — HYDRALAZINE HCL 25 MG PO TABS
25.0000 mg | ORAL_TABLET | Freq: Four times a day (QID) | ORAL | Status: DC
  Administered 2020-05-13 – 2020-05-17 (×19): 25 mg via ORAL
  Filled 2020-05-13 (×19): qty 1

## 2020-05-13 MED ORDER — ONDANSETRON HCL 4 MG/2ML IJ SOLN
4.0000 mg | Freq: Four times a day (QID) | INTRAMUSCULAR | Status: DC | PRN
  Filled 2020-05-13: qty 2

## 2020-05-13 MED ORDER — INSULIN ASPART 100 UNIT/ML ~~LOC~~ SOLN
0.0000 [IU] | Freq: Three times a day (TID) | SUBCUTANEOUS | Status: DC
  Administered 2020-05-13: 5 [IU] via SUBCUTANEOUS
  Administered 2020-05-13: 2 [IU] via SUBCUTANEOUS
  Administered 2020-05-13: 3 [IU] via SUBCUTANEOUS
  Administered 2020-05-14: 5 [IU] via SUBCUTANEOUS
  Administered 2020-05-14: 2 [IU] via SUBCUTANEOUS
  Administered 2020-05-15: 3 [IU] via SUBCUTANEOUS
  Administered 2020-05-15 (×2): 1 [IU] via SUBCUTANEOUS
  Administered 2020-05-16: 2 [IU] via SUBCUTANEOUS
  Administered 2020-05-17: 1 [IU] via SUBCUTANEOUS
  Administered 2020-05-18 – 2020-05-20 (×4): 2 [IU] via SUBCUTANEOUS
  Administered 2020-05-21: 1 [IU] via SUBCUTANEOUS

## 2020-05-13 MED ORDER — FUROSEMIDE 40 MG PO TABS
40.0000 mg | ORAL_TABLET | Freq: Every day | ORAL | Status: DC
  Administered 2020-05-13: 40 mg via ORAL
  Filled 2020-05-13: qty 1

## 2020-05-13 MED ORDER — GABAPENTIN 300 MG PO CAPS
300.0000 mg | ORAL_CAPSULE | Freq: Two times a day (BID) | ORAL | Status: DC
  Administered 2020-05-13 – 2020-05-21 (×17): 300 mg via ORAL
  Filled 2020-05-13 (×17): qty 1

## 2020-05-13 MED ORDER — FERROUS SULFATE 325 (65 FE) MG PO TABS
325.0000 mg | ORAL_TABLET | Freq: Two times a day (BID) | ORAL | Status: DC
  Administered 2020-05-13 – 2020-05-21 (×17): 325 mg via ORAL
  Filled 2020-05-13 (×17): qty 1

## 2020-05-13 MED ORDER — INSULIN GLARGINE 100 UNIT/ML ~~LOC~~ SOLN
22.0000 [IU] | Freq: Every day | SUBCUTANEOUS | Status: DC
  Administered 2020-05-14 – 2020-05-19 (×6): 22 [IU] via SUBCUTANEOUS
  Filled 2020-05-13 (×9): qty 0.22

## 2020-05-13 MED ORDER — ISOSORBIDE MONONITRATE ER 60 MG PO TB24
30.0000 mg | ORAL_TABLET | Freq: Every day | ORAL | Status: DC
  Administered 2020-05-13 – 2020-05-21 (×9): 30 mg via ORAL
  Filled 2020-05-13 (×11): qty 1

## 2020-05-13 MED ORDER — INSULIN DEGLUDEC 100 UNIT/ML ~~LOC~~ SOPN: 22.0000 [IU] | PEN_INJECTOR | Freq: Every day | SUBCUTANEOUS | Status: DC

## 2020-05-13 MED ORDER — AMLODIPINE BESYLATE 5 MG PO TABS
5.0000 mg | ORAL_TABLET | Freq: Every day | ORAL | Status: DC
  Administered 2020-05-13 – 2020-05-21 (×9): 5 mg via ORAL
  Filled 2020-05-13 (×9): qty 1

## 2020-05-13 NOTE — Evaluation (Signed)
Physical Therapy Evaluation Patient Details Name: GENIYAH EISCHEID MRN: [id number] DOB: [birth year] Today's Date: 05/13/2020   History of Present Illness  Clarie KEONIA PASKO is a 74 y.o. female with medical history significant of difficulty ambulating, history of generalized weakness, confusion, history of dementia, type 2 diabetes, history of hypercalcemia, hyperparathyroidism, hypertension, hyperlipidemia, peripheral neuropathy, history of UTI, history of moderate protein calorie malnutrition who is coming to the emergency department after having a fall at home.  She is disoriented to time, date, situation and place.    Clinical Impression  Patient frequently leaning to the left while seated at bedside, unsteady on feet and at high risk for falls, able to ambulate into hallway using tripod cane with occasional leaning on nearby objects for support, limited secondary to fatigue and put back to bed to after therapy to have [place] procedure.  Patient will benefit from continued physical therapy in hospital and recommended venue below to increase strength, balance, endurance for safe ADLs and gait.    Follow Up Recommendations SNF;Supervision for mobility/OOB;Supervision/Assistance - 24 hour    Equipment Recommendations  None recommended by PT    Recommendations for Other Services       Precautions / Restrictions Precautions Precautions: Fall Restrictions Weight Bearing Restrictions: No      Mobility  Bed Mobility Overal bed mobility: Needs Assistance Bed Mobility: Supine to Sit;Sit to Supine     Supine to sit: Supervision Sit to supine: Min assist   General bed mobility comments: labored movement, required assistance to move BLE back onto bed    Transfers Overall transfer level: Needs assistance Equipment used: Straight cane Transfers: Sit to/from Stand;Stand Pivot Transfers Sit to Stand: Min assist Stand pivot transfers: Min assist       General transfer comment: unsteady on feet  with occasional leaning on nearby objects for support  Ambulation/Gait Ambulation/Gait assistance: Min assist Gait Distance (Feet): 45 Feet Assistive device: Straight cane Gait Pattern/deviations: Decreased step length - right;Step-to pattern;Decreased step length - left;Decreased stride length Gait velocity: decreased   General Gait Details: slow labored unsteady cadence with occasional leaning on nearby objects using SPC, limited secondary to c/o fatigue  Stairs            Wheelchair Mobility    Modified Rankin (Stroke Patients Only)       Balance Overall balance assessment: Needs assistance Sitting-balance support: Feet supported;No upper extremity supported Sitting balance-Leahy Scale: Poor Sitting balance - Comments: fair/poor seated at EOB with frequent leaning to the left Postural control: Left lateral lean Standing balance support: During functional activity;Single extremity supported Standing balance-Leahy Scale: Poor Standing balance comment: fair/poor using SPC                             Pertinent Vitals/Pain Pain Assessment: No/denies pain    Home Living Family/patient expects to be discharged to:: Private residence Living Arrangements: Children Available Help at Discharge: Family;Available PRN/intermittently Type of Home: House Home Access: Stairs to enter Entrance Stairs-Rails: None Entrance Stairs-Number of Steps: 10 Home Layout: One level Home Equipment: Cane - single point;Walker - 2 wheels      Prior Function Level of Independence: Needs assistance   Gait / Transfers Assistance Needed: uses SPC for mobility  ADL's / Homemaking Assistance Needed: reports independence at home, unsure of reliability of pt report        Hand Dominance   Dominant Hand: Left    Extremity/Trunk Assessment   Upper  Extremity Assessment Upper Extremity Assessment: Defer to OT evaluation    Lower Extremity Assessment Lower Extremity  Assessment: Generalized weakness    Cervical / Trunk Assessment Cervical / Trunk Assessment: Normal  Communication   Communication: No difficulties  Cognition Arousal/Alertness: Awake/alert Behavior During Therapy: WFL for tasks assessed/performed Overall Cognitive Status: Within Functional Limits for tasks assessed                                        General Comments      Exercises     Assessment/Plan    PT Assessment Patient needs continued PT services  PT Problem List Decreased strength;Decreased activity tolerance;Decreased balance;Decreased mobility       PT Treatment Interventions Balance training;Gait training;Stair training;DME instruction;Functional mobility training;Therapeutic activities;Therapeutic exercise;Patient/family education    PT Goals (Current goals can be found in the Care Plan section)  Acute Rehab PT Goals Patient Stated Goal: To get stronger and go home Time For Goal Achievement: 05/27/20 Potential to Achieve Goals: Good    Frequency Min 3X/week   Barriers to discharge        Co-evaluation PT/OT/SLP Co-Evaluation/Treatment: Yes Reason for Co-Treatment: Complexity of the patient's impairments (multi-system involvement) PT goals addressed during session: Mobility/safety with mobility;Proper use of DME;Balance OT goals addressed during session: ADL's and self-care;Proper use of Adaptive equipment and DME       AM-PAC PT "6 Clicks" Mobility  Outcome Measure Help needed turning from your back to your side while in a flat bed without using bedrails?: None Help needed moving from lying on your back to sitting on the side of a flat bed without using bedrails?: A Little Help needed moving to and from a bed to a chair (including a wheelchair)?: A Little Help needed standing up from a chair using your arms (e.g., wheelchair or bedside chair)?: A Little Help needed to walk in hospital room?: A Lot Help needed climbing 3-5 steps  with a railing? : A Lot 6 Click Score: 17    End of Session   Activity Tolerance: Patient tolerated treatment well;Patient limited by fatigue Patient left: in bed;with call bell/phone within reach Nurse Communication: Mobility status PT Visit Diagnosis: Unsteadiness on feet (R26.81);Other abnormalities of gait and mobility (R26.89);Muscle weakness (generalized) (M62.81)    Time: 6381-7711 PT Time Calculation (min) (ACUTE ONLY): 30 min   Charges:   PT Evaluation $PT Eval Moderate Complexity: 1 Mod PT Treatments $Therapeutic Activity: 23-37 mins        2:38 PM, 05/13/20 Lonell Grandchild, MPT Physical Therapist with [facility] [phone] office [phone] mobile phone

## 2020-05-13 NOTE — Plan of Care (Signed)
  Problem: Acute Rehab PT Goals(only PT should resolve) Goal: Pt Will Go Supine/Side To Sit Outcome: Progressing Flowsheets (Taken 05/13/2020 1441) Pt will go Supine/Side to Sit:  with modified independence  with supervision Goal: Patient Will Transfer Sit To/From Stand Outcome: Progressing Flowsheets (Taken 05/13/2020 1441) Patient will transfer sit to/from stand: with min guard assist Goal: Pt Will Transfer Bed To Chair/Chair To Bed Outcome: Progressing Flowsheets (Taken 05/13/2020 1441) Pt will Transfer Bed to Chair/Chair to Bed: min guard assist Goal: Pt Will Ambulate Outcome: Progressing Flowsheets (Taken 05/13/2020 1441) Pt will Ambulate:  75 feet  with min guard assist  with cane  with rolling walker   2:42 PM, 05/13/20 Lonell Grandchild, MPT Physical Therapist with [facility] 336 [phone] office [phone] mobile phone

## 2020-05-13 NOTE — Plan of Care (Signed)
  Problem: Acute Rehab OT Goals (only OT should resolve) Goal: Pt. Will Perform Grooming Flowsheets (Taken 05/13/2020 1203) Pt Will Perform Grooming:  with supervision  standing Goal: Pt. Will Perform Upper Body Dressing Flowsheets (Taken 05/13/2020 1203) Pt Will Perform Upper Body Dressing:  with supervision  sitting Goal: Pt. Will Perform Lower Body Dressing Flowsheets (Taken 05/13/2020 1203) Pt Will Perform Lower Body Dressing:  with supervision  sitting/lateral leans  sit to/from stand Goal: Pt. Will Transfer To Toilet Flowsheets (Taken 05/13/2020 1203) Pt Will Transfer to Toilet:  with supervision  ambulating  regular height toilet Goal: Pt. Will Perform Toileting-Clothing Manipulation Flowsheets (Taken 05/13/2020 1203) Pt Will Perform Toileting - Clothing Manipulation and hygiene:  with supervision  sitting/lateral leans  sit to/from stand Goal: Pt/Caregiver Will Perform Home Exercise Program Flowsheets (Taken 05/13/2020 1203) Pt/caregiver will Perform Home Exercise Program:  Increased strength  Both right and left upper extremity  Independently  With written HEP provided

## 2020-05-13 NOTE — Progress Notes (Signed)
*  PRELIMINARY RESULTS* Echocardiogram 2D Echocardiogram has been performed.  Leavy Cella 05/13/2020, 9:26 AM

## 2020-05-13 NOTE — NC FL2 (Signed)
[place] LEVEL OF CARE SCREENING TOOL     IDENTIFICATION  Patient Name: Maria Parrish Birthdate: March 16, 1946 Sex: female Admission Date (Current Location): 05/12/2020  [facility] and [place] Number:  [employer] and Address:  [facility] [address], [place]      Provider Number: [id number]  Attending Physician Name and Address:  Mariel Aloe, MD  Relative Name and Phone Number:  [place],Gregory Pandora Leiter) [phone]    Current Level of Care: Hospital Recommended Level of Care: [place] Prior Approval Number: [id number]  Date Approved/Denied: 09/17/15 PASRR Number:    Discharge Plan: SNF    Current Diagnoses: Patient Active Problem List   Diagnosis Date Noted  . Acute on chronic diastolic congestive heart failure ([place]) 05/12/2020  . Elevated serum creatinine 05/12/2020  . Personal history of noncompliance with medical treatment, presenting hazards to health 12/17/2017  . Hyperparathyroidism ([place]) 10/19/2016  . Vitamin D deficiency 01/12/2016  . Altered mental status 09/22/2015  . Left hemiparesis ([place]) 09/22/2015  . Acute encephalopathy 09/22/2015  . Palliative care encounter   . UTI (urinary tract infection) 09/14/2015  . Dementia ([place]) 09/14/2015  . General weakness 09/14/2015  . Subacute confusional state 09/14/2015  . Uncontrolled type 2 diabetes mellitus with complication, without long-term current use of insulin ([place]) 09/14/2015  . Confusion 09/14/2015  . Fall at home   . Elevated parathyroid hormone 09/09/2015  . Mucosal abnormality of stomach   . Diverticulosis of colon without hemorrhage   . Hypercalcemia 08/10/2015  . Goiter 08/10/2015  . Abnormal weight loss 07/28/2015  . Uncontrolled type 2 diabetes mellitus ([place]) 03/26/2015  . Neuropathy 03/26/2015  . Hypokalemia 03/26/2015  . Essential hypertension, benign 03/26/2015  . Mixed hyperlipidemia 03/26/2015  . Malnutrition of moderate  degree ([place]) 03/26/2015    Orientation RESPIRATION BLADDER Height & Weight     Self, Situation, Place  Normal External catheter Weight: 155 lb (70.3 kg) Height:  5\' 4"  (162.6 cm)  BEHAVIORAL SYMPTOMS/MOOD NEUROLOGICAL BOWEL NUTRITION STATUS      Continent Diet  AMBULATORY STATUS COMMUNICATION OF NEEDS Skin   Extensive Assist Verbally Normal                       Personal Care Assistance Level of Assistance  Bathing, Feeding, Dressing Bathing Assistance: Maximum assistance Feeding assistance: Limited assistance Dressing Assistance: Maximum assistance     Functional Limitations Info  Sight, Hearing, Speech Sight Info: Impaired Hearing Info: Impaired Speech Info: Adequate    SPECIAL CARE FACTORS FREQUENCY  PT (By licensed PT), OT (By licensed OT)     PT Frequency: 5x OT Frequency: 5x            Contractures Contractures Info: Not present    Additional Factors Info  Code Status, Allergies Code Status Info: Full Allergies Info: Penicillins, Statins           Current Medications (05/13/2020):  This is the current hospital active medication list Current Facility-Administered Medications  Medication Dose Route Frequency Provider Last Rate Last Admin  . acetaminophen (TYLENOL) tablet 650 mg  650 mg Oral Q6H PRN Reubin Milan, MD       Or  . acetaminophen (TYLENOL) suppository 650 mg  650 mg Rectal Q6H PRN Reubin Milan, MD      . amLODipine [facility]) tablet 5 mg  5 mg Oral Daily Reubin Milan, MD   5 mg at 05/13/20 0830  .  aspirin EC tablet 81 mg  81 mg Oral Daily Reubin Milan, MD   81 mg at 05/13/20 0830  . atorvastatin (LIPITOR) tablet 40 mg  40 mg Oral Daily Reubin Milan, MD   40 mg at 05/13/20 0830  . enoxaparin (LOVENOX) injection 30 mg  30 mg Subcutaneous Q24H Reubin Milan, MD   30 mg at 05/13/20 0830  . ezetimibe (ZETIA) tablet 10 mg  10 mg Oral Daily Reubin Milan, MD   10 mg at 05/13/20 0830  . ferrous  sulfate tablet 325 mg  325 mg Oral BID Reubin Milan, MD   325 mg at 05/13/20 0830  . gabapentin (NEURONTIN) capsule 300 mg  300 mg Oral BID Reubin Milan, MD   300 mg at 05/13/20 0830  . hydrALAZINE (APRESOLINE) tablet 25 mg  25 mg Oral Q6H Reubin Milan, MD   25 mg at 05/13/20 1229  . insulin aspart (novoLOG) injection 0-9 Units  0-9 Units Subcutaneous TID WC Reubin Milan, MD   5 Units at 05/13/20 1232  . insulin glargine (LANTUS) injection 22 Units  22 Units Subcutaneous Daily Mariel Aloe, MD      . isosorbide mononitrate (IMDUR) 24 hr tablet 30 mg  30 mg Oral Daily Reubin Milan, MD   30 mg at 05/13/20 0830  . metoprolol tartrate (LOPRESSOR) tablet 25 mg  25 mg Oral BID Reubin Milan, MD   25 mg at 05/13/20 0830  . ondansetron (ZOFRAN) tablet 4 mg  4 mg Oral Q6H PRN Reubin Milan, MD       Or  . ondansetron [facility]) injection 4 mg  4 mg Intravenous Q6H PRN Reubin Milan, MD      . polyethylene glycol [facility]) packet 17 g  17 g Oral Daily PRN Reubin Milan, MD         Discharge Medications: Please see discharge summary for a list of discharge medications.  Relevant Imaging Results:  Relevant Lab Results:   Additional Information Pt [id number]  Natasha Bence, LCSW

## 2020-05-13 NOTE — Evaluation (Signed)
Occupational Therapy Evaluation Patient Details Name: Maria Parrish MRN: [id number] DOB: 18-Jan-1946 Today's Date: 05/13/2020    History of Present Illness Maria Parrish is a 74 y.o. female with medical history significant of difficulty ambulating, history of generalized weakness, confusion, history of dementia, type 2 diabetes, history of hypercalcemia, hyperparathyroidism, hypertension, hyperlipidemia, peripheral neuropathy, history of UTI, history of moderate protein calorie malnutrition who is coming to the emergency department after having a fall at home.  She is disoriented to time, date, situation and place.   Clinical Impression   Pt agreeable to evaluation this am. Performing mobility tasks with min guard to min assist. Pt requiring assistance for ADL completion, unsteady during functional mobility with SPC. Pt reports son lives with her but works during the day. Recommend SNF on discharge to improve pt safety and independence during ADLs. If family is able to provide 24/7 supervision possibly [place] OT on discharge.     Follow Up Recommendations  SNF    Equipment Recommendations  None recommended by OT       Precautions / Restrictions Precautions Precautions: Fall Restrictions Weight Bearing Restrictions: No      Mobility Bed Mobility Overal bed mobility: Needs Assistance Bed Mobility: Supine to Sit;Sit to Supine     Supine to sit: Supervision Sit to supine: Min assist        Transfers                 General transfer comment: Defer to PT note        ADL either performed or assessed with clinical judgement   ADL Overall ADL's : Needs assistance/impaired     Grooming: Wash/dry hands;Min guard;Standing               Lower Body Dressing: Moderate assistance;Sitting/lateral leans Lower Body Dressing Details (indicate cue type and reason): Required assist for donning socks, attempted to donn with foot over knee but was unable to maintain sitting  upright Toilet Transfer: Min guard;Ambulation;Grab bars;Regular Toilet           Functional mobility during ADLs: Min guard;Cane       Vision Baseline Vision/History: No visual deficits Patient Visual Report: No change from baseline Vision Assessment?: No apparent visual deficits            Pertinent Vitals/Pain Pain Assessment: No/denies pain     Hand Dominance Left   Extremity/Trunk Assessment Upper Extremity Assessment Upper Extremity Assessment: Overall WFL for tasks assessed   Lower Extremity Assessment Lower Extremity Assessment: Defer to PT evaluation   Cervical / Trunk Assessment Cervical / Trunk Assessment: Normal   Communication Communication Communication: No difficulties   Cognition Arousal/Alertness: Awake/alert Behavior During Therapy: WFL for tasks assessed/performed Overall Cognitive Status: Within Functional Limits for tasks assessed                                                Home Living Family/patient expects to be discharged to:: Private residence Living Arrangements: Children (son) Available Help at Discharge: Family;Available PRN/intermittently Type of Home: House Home Access: Stairs to enter [employer] of Steps: 10 Entrance Stairs-Rails: None Home Layout: One level     Bathroom Shower/Tub: Teacher, early years/pre: Standard     Home Equipment: Cane - single point;Walker - 2 wheels          Prior Functioning/Environment  Level of Independence: Needs assistance  Gait / Transfers Assistance Needed: uses SPC for mobility ADL's / Homemaking Assistance Needed: reports independence at home, unsure of reliability of pt report            OT Problem List: Decreased strength;Decreased activity tolerance;Impaired balance (sitting and/or standing)      OT Treatment/Interventions: Self-care/ADL training;Therapeutic exercise;DME and/or AE instruction;Therapeutic activities;Patient/family  education    OT Goals(Current goals can be found in the care plan section) Acute Rehab OT Goals Patient Stated Goal: To get stronger and go home OT Goal Formulation: With patient Time For Goal Achievement: 05/27/20 Potential to Achieve Goals: Good  OT Frequency: Min 2X/week   Barriers to D/C: Decreased caregiver support          Co-evaluation PT/OT/SLP Co-Evaluation/Treatment: Yes Reason for Co-Treatment: Complexity of the patient's impairments (multi-system involvement)   OT goals addressed during session: ADL's and self-care;Proper use of Adaptive equipment and DME      AM-PAC OT "6 Clicks" Daily Activity     Outcome Measure Help from another person eating meals?: None Help from another person taking care of personal grooming?: A Little Help from another person toileting, which includes using toliet, bedpan, or urinal?: A Little Help from another person bathing (including washing, rinsing, drying)?: A Little Help from another person to put on and taking off regular upper body clothing?: A Little Help from another person to put on and taking off regular lower body clothing?: A Lot 6 Click Score: 18   End of Session Equipment Utilized During Treatment:  [facility])  Activity Tolerance: Patient tolerated treatment well Patient left: in bed;with call bell/phone within reach;with bed alarm set  OT Visit Diagnosis: Repeated falls (R29.6);Muscle weakness (generalized) (M62.81)                Time: 7591-6384 OT Time Calculation (min): 16 min Charges:  OT General Charges $OT Visit: 1 Visit OT Evaluation $OT Eval Low Complexity: [place], OTR/L  [phone] 05/13/2020, 12:00 PM

## 2020-05-13 NOTE — Progress Notes (Signed)
PROGRESS NOTE    Maria Parrish  HGD:[id number] DOB: 1946-03-20 DOA: 05/12/2020 PCP: Vidal Schwalbe, MD   Brief Narrative: Maria Parrish is a 74 y.o.  female with medical history significant of difficulty ambulating, history of generalized weakness, confusion, history of dementia, type 2 diabetes, history of hypercalcemia, hyperparathyroidism, hypertension, hyperlipidemia, peripheral neuropathy. Patient presented secondary to a fall and found to have concern for a heart failure exacerbation. Given lasix. Transthoracic Echocardiogram ordered and pending.   Assessment & Plan:   Principal Problem:   Acute on chronic diastolic congestive heart failure (HCC) Active Problems:   Uncontrolled type 2 diabetes mellitus (HCC)   Hypokalemia   Essential hypertension, benign   Mixed hyperlipidemia   Dementia (HCC)   Left hemiparesis (HCC)   Elevated serum creatinine   Acute on chronic diastolic heart failure Last Transthoracic Echocardiogram from 2017. Significantly elevated BNP. Given Lasix 20 mg IV x1. Currently on room air with no current concerns about dyspnea. Transthoracic Echocardiogram ordered and is pending. Appears to be more euvolemic this morning -Await BMP and decide on continued diuresis  AKI Assumed AKI. Last creatinine from 10 months ago was around 1.5. Creatinien of 2.62 on admission presumably in setting of fluid overload. BMP ordered for this morning is still not obtained. -Await BMP today to decide on further diuresis   Diabetes mellitus, type 2 Patient is on Tresiba 22 units daily as an outpatient. Metformin listed but apparently no longer taking. -Continue Lantus 22 units daily and SSI  Essential hypertension Uncontrolled -Continue amlodipine, Imdur, hydralazine and metoprolol  Hyperlipidemia -Continue Lipitor and Zetia  Anemia Unknown chronicity. Stable while inpatient. -Iron, ferritin, TIBC   Neuropathy -Continue gabapentin  Hypokalemia Given  supplementation. BMP is still pending today  Hyperparathyroidism Noted on chart. Calcium normal.  Dementia Not fully oriented but appears to be stable. Have not reached family to confirm.   DVT prophylaxis: Lovenox Code Status:   Code Status: Full Code Family Communication: None at bedside. Called son but no answer Disposition Plan: Discharge to SNF in 1-3 days pending Transthoracic Echocardiogram results and improvement in AKI   Consultants:   None  Procedures:   None  Antimicrobials:  None    Subjective: No dyspnea or chest pain.  Objective: Vitals:   05/13/20 0430 05/13/20 0447 05/13/20 0506 05/13/20 0639  BP: (!) 192/108 (!) 169/87 (!) 179/97 (!) 197/91  Pulse: 78 65 64 66  Resp: (!) 27 17 17  (!) 25  Temp:      TempSrc:      SpO2: 96% 95% 96% 95%  Weight:      Height:       No intake or output data in the 24 hours ending 05/13/20 0750 Filed Weights   05/12/20 1612  Weight: 70.3 kg    Examination:  General exam: Appears calm and comfortable Respiratory system: Diminished. Mild rales. Respiratory effort normal. Cardiovascular system: S1 & S2 heard, RRR. No murmurs, rubs, gallops or clicks. Gastrointestinal system: Abdomen is nondistended, soft and nontender. No organomegaly or masses felt. Normal bowel sounds heard. Central nervous system: Alert. No focal neurological deficits. Musculoskeletal:  No calf tenderness Skin: No cyanosis. No rashes Psychiatry: Judgement and insight appear normal. Mood & affect appropriate.     Data Reviewed: I have personally reviewed following labs and imaging studies  CBC Lab Results  Component Value Date   WBC 8.1 05/13/2020   RBC 3.29 (L) 05/13/2020   HGB 9.5 (L) 05/13/2020   HCT 29.6 (L) 05/13/2020  MCV 90.0 05/13/2020   MCH 28.9 05/13/2020   PLT 371 05/13/2020   MCHC 32.1 05/13/2020   RDW 15.3 05/13/2020   LYMPHSABS 2.0 05/01/2017   MONOABS 0.5 05/01/2017   EOSABS 0.3 05/01/2017   BASOSABS 0.0  35/32/9924     Last metabolic panel Lab Results  Component Value Date   NA 139 05/12/2020   K 2.6 (LL) 05/12/2020   CL 106 05/12/2020   CO2 20 (L) 05/12/2020   BUN 37 (H) 05/12/2020   CREATININE 2.62 (H) 05/12/2020   GLUCOSE 196 (H) 05/12/2020   GFRNONAA 19 (L) 05/12/2020   GFRAA 40 (L) 07/16/2019   CALCIUM 9.2 05/12/2020   PHOS 3.2 05/21/2017   PROT 7.4 05/13/2020   ALBUMIN 3.2 (L) 05/13/2020   BILITOT 0.9 05/13/2020   ALKPHOS 71 05/13/2020   AST 34 05/13/2020   ALT 50 (H) 05/13/2020   ANIONGAP 13 05/12/2020    CBG (last 3)  Recent Labs    05/12/20 1623  GLUCAP 190*     GFR: Estimated Creatinine Clearance: 18.4 mL/min (A) (by C-G formula based on SCr of 2.62 mg/dL (H)).  Coagulation Profile: No results for input(s): INR, PROTIME in the last 168 hours.  Recent Results (from the past 240 hour(s))  Respiratory Panel by RT PCR (Flu A&B, Covid) - Nasopharyngeal Swab     Status: None   Collection Time: 05/12/20  7:33 PM   Specimen: Nasopharyngeal Swab  Result Value Ref Range Status   SARS Coronavirus 2 by RT PCR NEGATIVE NEGATIVE Final    Comment: (NOTE) SARS-CoV-2 target nucleic acids are NOT DETECTED.  The SARS-CoV-2 RNA is generally detectable in upper respiratoy specimens during the acute phase of infection. The lowest concentration of SARS-CoV-2 viral copies this assay can detect is 131 copies/mL. A negative result does not preclude SARS-Cov-2 infection and should not be used as the sole basis for treatment or other patient management decisions. A negative result may occur with  improper specimen collection/handling, submission of specimen other than nasopharyngeal swab, presence of viral mutation(s) within the areas targeted by this assay, and inadequate number of viral copies (<131 copies/mL). A negative result must be combined with clinical observations, patient history, and epidemiological information. The expected result is Negative.  Fact Sheet for  Patients:  PinkCheek.be  Fact Sheet for Healthcare Providers:  GravelBags.it  This test is no t yet approved or cleared by the [place] FDA and  has been authorized for detection and/or diagnosis of SARS-CoV-2 by FDA under an Emergency Use Authorization (EUA). This EUA will remain  in effect (meaning this test can be used) for the duration of the COVID-19 declaration under Section 564(b)(1) of the Act, 21 U.S.C. section 360bbb-3(b)(1), unless the authorization is terminated or revoked sooner.     Influenza A by PCR NEGATIVE NEGATIVE Final   Influenza B by PCR NEGATIVE NEGATIVE Final    Comment: (NOTE) The Xpert Xpress SARS-CoV-2/FLU/RSV assay is intended as an aid in  the diagnosis of influenza from Nasopharyngeal swab specimens and  should not be used as a sole basis for treatment. Nasal washings and  aspirates are unacceptable for Xpert Xpress SARS-CoV-2/FLU/RSV  testing.  Fact Sheet for Patients: PinkCheek.be  Fact Sheet for Healthcare Providers: GravelBags.it  This test is not yet approved or cleared by the [place] FDA and  has been authorized for detection and/or diagnosis of SARS-CoV-2 by  FDA under an Emergency Use Authorization (EUA). This EUA will remain  in effect (meaning this  test can be used) for the duration of the  Covid-19 declaration under Section 564(b)(1) of the Act, 21  U.S.C. section 360bbb-3(b)(1), unless the authorization is  terminated or revoked. Performed at [facility], [address] [place],  [address]         Radiology Studies: [facility] 1 View  Result Date: 05/12/2020 CLINICAL DATA:  Shortness of breath EXAM: PORTABLE CHEST 1 VIEW COMPARISON:  May 01, 2017 FINDINGS: The heart size and mediastinal contours are within normal limits. There is prominence of the central pulmonary vasculature. Aortic knob  calcifications are seen. No acute osseous abnormality. IMPRESSION: Pulmonary vascular congestion. Electronically Signed   By: Prudencio Pair M.D.   On: 05/12/2020 20:53        Scheduled Meds: . amLODipine  5 mg Oral Daily  . aspirin EC  81 mg Oral Daily  . atorvastatin  40 mg Oral Daily  . enoxaparin (LOVENOX) injection  30 mg Subcutaneous Q24H  . ezetimibe  10 mg Oral Daily  . ferrous sulfate  325 mg Oral BID  . gabapentin  300 mg Oral BID  . hydrALAZINE  25 mg Oral Q6H  . insulin aspart  0-9 Units Subcutaneous TID WC  . insulin glargine  22 Units Subcutaneous Daily  . isosorbide mononitrate  30 mg Oral Daily  . metoprolol tartrate  25 mg Oral BID   Continuous Infusions:   LOS: 0 days     Cordelia Poche, MD [facility] 05/13/2020, 7:50 AM  If 7PM-7AM, please contact night-coverage [email or web address]

## 2020-05-13 NOTE — ED Notes (Signed)
Pt repositioned in bed for comfort. Call bell within reach, tv turned to channel of preference. Lighting dimmed. Pt denied any other needs at this time. Will continue to monitor.

## 2020-05-13 NOTE — Clinical Social Work Note (Signed)
CSW contacted pts son Kroeger,Gregory [phone]. CSW informed Mr. Wayment that there has been a PT recommendation for SNF at d/c. Mr. Vanacker states that he would like to talk to his brother tonight 10/28 before a referral is made. Mr. Swanner states that he would like to talk with pt and TOC tomorrow to make decision. Mr. Vandenheuvel states that he will be in pts room tomorrow and would like to speak with TOC at 12pm on 10/28. CSW informed Mr. Yazdi that someone from [facility] would be available to speak tomorrow. CSW also explained the referral process to Mr. Bielby. TOC to follow.

## 2020-05-13 NOTE — ED Notes (Signed)
Pt provided with new gown and warm blanket d/t linens getting bloody from patient removing IV.

## 2020-05-13 NOTE — ED Notes (Signed)
While rounding, pt was found sitting at the edge of her bed. When asked what she was doing, pt stated, "I need to use the bathroom." Pt was informed that she had a purewick on and that she didn't need to get up. She stated, "Oh yeah, I forgot about that."  Pt was repositioned in bed for comfort and denied any other needs at this time. Call bell within reach, bed in low position. Will continue to monitor.

## 2020-05-14 LAB — IRON AND TIBC
Iron: 37 ug/dL (ref 28–170)
Saturation Ratios: 17 % (ref 10.4–31.8)
TIBC: 213 ug/dL — ABNORMAL LOW (ref 250–450)
UIBC: 176 ug/dL

## 2020-05-14 LAB — BASIC METABOLIC PANEL WITH GFR
Anion gap: 10 (ref 5–15)
BUN: 46 mg/dL — ABNORMAL HIGH (ref 8–23)
CO2: 19 mmol/L — ABNORMAL LOW (ref 22–32)
Calcium: 8.6 mg/dL — ABNORMAL LOW (ref 8.9–10.3)
Chloride: 109 mmol/L (ref 98–111)
Creatinine, Ser: 2.89 mg/dL — ABNORMAL HIGH (ref 0.44–1.00)
GFR, Estimated: 17 mL/min — ABNORMAL LOW
Glucose, Bld: 268 mg/dL — ABNORMAL HIGH (ref 70–99)
Potassium: 3.1 mmol/L — ABNORMAL LOW (ref 3.5–5.1)
Sodium: 138 mmol/L (ref 135–145)

## 2020-05-14 LAB — BASIC METABOLIC PANEL
Anion gap: 11 (ref 5–15)
BUN: 46 mg/dL — ABNORMAL HIGH (ref 8–23)
CO2: 20 mmol/L — ABNORMAL LOW (ref 22–32)
Calcium: 8.8 mg/dL — ABNORMAL LOW (ref 8.9–10.3)
Chloride: 108 mmol/L (ref 98–111)
Creatinine, Ser: 2.8 mg/dL — ABNORMAL HIGH (ref 0.44–1.00)
GFR, Estimated: 17 mL/min — ABNORMAL LOW (ref >60)
Glucose, Bld: 218 mg/dL — ABNORMAL HIGH (ref 70–99)
Potassium: 3.1 mmol/L — ABNORMAL LOW (ref 3.5–5.1)
Sodium: 139 mmol/L (ref 135–145)

## 2020-05-14 LAB — URINE CULTURE

## 2020-05-14 LAB — GLUCOSE, CAPILLARY
Glucose-Capillary: 180 mg/dL — ABNORMAL HIGH (ref 70–99)
Glucose-Capillary: 252 mg/dL — ABNORMAL HIGH (ref 70–99)
Glucose-Capillary: 94 mg/dL (ref 70–99)
Glucose-Capillary: 192 mg/dL — ABNORMAL HIGH (ref 70–99)

## 2020-05-14 LAB — HEMOGLOBIN A1C
Hgb A1c MFr Bld: 7.9 % — ABNORMAL HIGH (ref 4.8–5.6)
Mean Plasma Glucose: 180 mg/dL

## 2020-05-14 LAB — FERRITIN: Ferritin: 147 ng/mL (ref 11–307)

## 2020-05-14 LAB — MAGNESIUM: Magnesium: 2.2 mg/dL (ref 1.7–2.4)

## 2020-05-14 MED ORDER — POTASSIUM CHLORIDE CRYS ER 20 MEQ PO TBCR
40.0000 meq | EXTENDED_RELEASE_TABLET | ORAL | Status: AC
  Administered 2020-05-14 (×2): 40 meq via ORAL
  Filled 2020-05-14 (×2): qty 2

## 2020-05-14 MED ORDER — POTASSIUM CHLORIDE CRYS ER 10 MEQ PO TBCR: 10.0000 meq | EXTENDED_RELEASE_TABLET | Freq: Once | ORAL | Status: DC

## 2020-05-14 MED ORDER — FUROSEMIDE 10 MG/ML IJ SOLN
40.0000 mg | Freq: Every day | INTRAMUSCULAR | Status: DC
  Administered 2020-05-14 – 2020-05-17 (×4): 40 mg via INTRAVENOUS
  Filled 2020-05-14 (×5): qty 4

## 2020-05-14 NOTE — Care Management Important Message (Signed)
Important Message  Patient Details  Name: Maria Parrish MRN: [id number] Date of Birth: [birth year]   Medicare Important Message Given:  Yes - Important Message mailed due to current National Emergency     Tommy Medal 05/14/2020, 5:08 PM

## 2020-05-14 NOTE — TOC Initial Note (Signed)
Transition of Care [facility]) - Initial/Assessment Note    Patient Details  Name: Maria Parrish MRN: [id number] Date of Birth: 05-Jun-1946  [facility]) CM/SW Contact:    Natasha Bence, LCSW Phone Number: 05/14/2020, 3:15 PM  Clinical Narrative:                 Patient is a 74 year old admitted for acute on chronic diastolic congestive heart failure. Per patient's sone Maria Parrish Patient declined SNF but agreeable to [facility]. CSW referred patient to Tim with [facility] for HHPT and aid. Tim agreeable to provide services. TOC signing off.         Patient Goals and CMS Choice        Expected Discharge Plan and Services                                                Prior Living Arrangements/Services                       Activities of Daily Living      Permission Sought/Granted                  Emotional Assessment              Admission diagnosis:  Hypokalemia [E87.6] SOB (shortness of breath) [R06.02] Acute on chronic diastolic congestive heart failure (HCC) [I50.33] AKI (acute kidney injury) ([place]) [N17.9] New onset of congestive heart failure (HCC) [I50.9] Acute on chronic diastolic (congestive) heart failure ([place]) [I50.33] Patient Active Problem List   Diagnosis Date Noted  . Acute on chronic diastolic (congestive) heart failure ([place]) 05/13/2020  . Acute on chronic diastolic congestive heart failure ([place]) 05/12/2020  . Elevated serum creatinine 05/12/2020  . Personal history of noncompliance with medical treatment, presenting hazards to health 12/17/2017  . Hyperparathyroidism ([place]) 10/19/2016  . Vitamin D deficiency 01/12/2016  . Altered mental status 09/22/2015  . Left hemiparesis ([place]) 09/22/2015  . Acute encephalopathy 09/22/2015  . Palliative care encounter   . UTI (urinary tract infection) 09/14/2015  . Dementia ([place]) 09/14/2015  . General weakness 09/14/2015  . Subacute confusional state 09/14/2015  . Uncontrolled  type 2 diabetes mellitus with complication, without long-term current use of insulin ([place]) 09/14/2015  . Confusion 09/14/2015  . Fall at home   . Elevated parathyroid hormone 09/09/2015  . Mucosal abnormality of stomach   . Diverticulosis of colon without hemorrhage   . Hypercalcemia 08/10/2015  . Goiter 08/10/2015  . Abnormal weight loss 07/28/2015  . Uncontrolled type 2 diabetes mellitus ([place]) 03/26/2015  . Neuropathy 03/26/2015  . Hypokalemia 03/26/2015  . Essential hypertension, benign 03/26/2015  . Mixed hyperlipidemia 03/26/2015  . Malnutrition of moderate degree ([place]) 03/26/2015   PCP:  Vidal Schwalbe, MD Pharmacy:   [facility] [address], [place] - [place] [address] [place] [address] Phone: [phone] Fax: [phone]     Social Determinants of Health (SDOH) Interventions    Readmission Risk Interventions No flowsheet data found.

## 2020-05-14 NOTE — Progress Notes (Signed)
PROGRESS NOTE    [name]  QBV:[id number] DOB: 20-Dec-1945 DOA: 05/12/2020 PCP: Maria Schwalbe, MD   Brief Narrative: Maria Parrish is a 74 y.o.  female with medical history significant of difficulty ambulating, history of generalized weakness, confusion, history of dementia, type 2 diabetes, history of hypercalcemia, hyperparathyroidism, hypertension, hyperlipidemia, peripheral neuropathy. Patient presented secondary to a fall and found to have concern for a heart failure exacerbation. Given lasix. Transthoracic Echocardiogram ordered and pending.   Assessment & Plan:   Principal Problem:   Acute on chronic diastolic congestive heart failure (HCC) Active Problems:   Uncontrolled type 2 diabetes mellitus (HCC)   Hypokalemia   Essential hypertension, benign   Mixed hyperlipidemia   Dementia (HCC)   Left hemiparesis (HCC)   Elevated serum creatinine   Acute on chronic diastolic (congestive) heart failure (HCC)   Acute on chronic diastolic heart failure Acute systolic heart failure Last Transthoracic Echocardiogram from 2017. Significantly elevated BNP. Given Lasix 20 mg IV x1. Currently on room air with no current concerns about dyspnea. Transthoracic Echocardiogram significant for reduced EF of 40-45% and grade 2 diastolic dysfunction -Cardiology consulted -Lasix 40 mg IV daily  AKI on CKD stage IIIb Assumed AKI. Last creatinine from 10 months ago was around 1.5. Creatinien of 2.62 on admission presumably in setting of fluid overload. Repeat BMP probably erroneously indicated a creatinine bump up to 8.64 but subsequent creatinine was 2.89. Creatinine trended down slightly. Patient appears hypervolemic. Per son, patient's PCP was referring patient to nephrologist -Diuresis as above   Diabetes mellitus, type 2 Patient is on Tresiba 22 units daily as an outpatient. Metformin listed but apparently no longer taking.  Patient was not given Lantus on 10/28 for unknown  reason -Continue Lantus 22 units daily and SSI  Essential hypertension Uncontrolled -Continue amlodipine, Imdur, hydralazine and metoprolol -May switch one antihypertensive for ACEi or ARB vs Entresto however complicated by kidney injury currently  Hyperlipidemia -Continue Lipitor and Zetia  Anemia Unknown chronicity. Stable while inpatient. Iron panel unremarkable.   Neuropathy -Continue gabapentin  Hypokalemia In setting of diuresis. Magnesium repleted -Kdur 40 meq  Hyperparathyroidism Noted on chart. Calcium normal.  Dementia Not fully oriented but appears to be stable.   DVT prophylaxis: Lovenox Code Status:   Code Status: Full Code Family Communication: None at bedside. Son on telephone Disposition Plan: Discharge to SNF in 1-3 days   Consultants:   Cardiology  Procedures:   TRANSTHORACIC ECHOCARDIOGRAM (10/28) IMPRESSIONS    1. Left ventricular ejection fraction, by estimation, is 40 to 45%. The  left ventricle has mildly decreased function. The left ventricle  demonstrates global hypokinesis. The left ventricular internal cavity size  was mildly dilated. There is mild left  ventricular hypertrophy. Left ventricular diastolic parameters are  consistent with Grade II diastolic dysfunction (pseudonormalization).  Elevated left atrial pressure.  2. Right ventricular systolic function is mildly reduced. The right  ventricular size is normal. There is moderately elevated pulmonary artery  systolic pressure. The estimated right ventricular systolic pressure is  28.0 mmHg.  3. The mitral valve is grossly normal. Mild to moderate mitral valve  regurgitation.  4. The aortic valve is tricuspid. Aortic valve regurgitation is not  visualized. Mild aortic valve sclerosis is present, with no evidence of  aortic valve stenosis.  5. The inferior vena cava is dilated in size with >50% respiratory  variability, suggesting right atrial pressure of 8 mmHg.    Antimicrobials:  None    Subjective: No  dyspnea  Objective: Vitals:   05/13/20 2007 05/13/20 2049 05/14/20 0042 05/14/20 0429  BP: (!) 188/74  (!) 152/85 (!) 158/88  Pulse: 87  70 72  Resp: 16   16  Temp: 98 F (36.7 C)   (!) 97.4 F (36.3 C)  TempSrc:    Oral  SpO2: 97% 95%  99%  Weight:      Height:       No intake or output data in the 24 hours ending 05/14/20 0725 Filed Weights   05/12/20 1612  Weight: 70.3 kg    Examination:  General exam: Appears calm and comfortable Respiratory system: Clear to auscultation. Respiratory effort normal. Cardiovascular system: S1 & S2 heard, RRR. No murmurs, rubs, gallops or clicks. Gastrointestinal system: Abdomen is nondistended, soft and nontender. No organomegaly or masses felt. Normal bowel sounds heard. Central nervous system: Alert and oriented to person and place. No focal neurological deficits. Musculoskeletal: 2+ BLE pitting edema. No calf tenderness Skin: No cyanosis. No rashes Psychiatry: Judgement and insight appear normal. Mood & affect appropriate.     Data Reviewed: I have personally reviewed following labs and imaging studies  CBC Lab Results  Component Value Date   WBC 8.1 05/13/2020   RBC 3.29 (L) 05/13/2020   HGB 9.5 (L) 05/13/2020   HCT 29.6 (L) 05/13/2020   MCV 90.0 05/13/2020   MCH 28.9 05/13/2020   PLT 371 05/13/2020   MCHC 32.1 05/13/2020   RDW 15.3 05/13/2020   LYMPHSABS 2.0 05/01/2017   MONOABS 0.5 05/01/2017   EOSABS 0.3 05/01/2017   BASOSABS 0.0 79/08/4095     Last metabolic panel Lab Results  Component Value Date   NA 139 05/14/2020   K 3.1 (L) 05/14/2020   CL 108 05/14/2020   CO2 20 (L) 05/14/2020   BUN 46 (H) 05/14/2020   CREATININE 2.80 (H) 05/14/2020   GLUCOSE 218 (H) 05/14/2020   GFRNONAA 17 (L) 05/14/2020   GFRAA 40 (L) 07/16/2019   CALCIUM 8.8 (L) 05/14/2020   PHOS 3.2 05/21/2017   PROT 7.4 05/13/2020   ALBUMIN 3.2 (L) 05/13/2020   BILITOT 0.9 05/13/2020    ALKPHOS 71 05/13/2020   AST 34 05/13/2020   ALT 50 (H) 05/13/2020   ANIONGAP 11 05/14/2020    CBG (last 3)  Recent Labs    05/13/20 1232 05/13/20 1725 05/13/20 2004  GLUCAP 282* 167* 220*     GFR: Estimated Creatinine Clearance: 17.2 mL/min (A) (by C-G formula based on SCr of 2.8 mg/dL (H)).  Coagulation Profile: No results for input(s): INR, PROTIME in the last 168 hours.  Recent Results (from the past 240 hour(s))  Respiratory Panel by RT PCR (Flu A&B, Covid) - Nasopharyngeal Swab     Status: None   Collection Time: 05/12/20  7:33 PM   Specimen: Nasopharyngeal Swab  Result Value Ref Range Status   SARS Coronavirus 2 by RT PCR NEGATIVE NEGATIVE Final    Comment: (NOTE) SARS-CoV-2 target nucleic acids are NOT DETECTED.  The SARS-CoV-2 RNA is generally detectable in upper respiratoy specimens during the acute phase of infection. The lowest concentration of SARS-CoV-2 viral copies this assay can detect is 131 copies/mL. A negative result does not preclude SARS-Cov-2 infection and should not be used as the sole basis for treatment or other patient management decisions. A negative result may occur with  improper specimen collection/handling, submission of specimen other than nasopharyngeal swab, presence of viral mutation(s) within the areas targeted by this assay, and inadequate number of  viral copies (<131 copies/mL). A negative result must be combined with clinical observations, patient history, and epidemiological information. The expected result is Negative.  Fact Sheet for Patients:  PinkCheek.be  Fact Sheet for Healthcare Providers:  GravelBags.it  This test is no t yet approved or cleared by the [place] FDA and  has been authorized for detection and/or diagnosis of SARS-CoV-2 by FDA under an Emergency Use Authorization (EUA). This EUA will remain  in effect (meaning this test can be used) for the  duration of the COVID-19 declaration under Section 564(b)(1) of the Act, 21 U.S.C. section 360bbb-3(b)(1), unless the authorization is terminated or revoked sooner.     Influenza A by PCR NEGATIVE NEGATIVE Final   Influenza B by PCR NEGATIVE NEGATIVE Final    Comment: (NOTE) The Xpert Xpress SARS-CoV-2/FLU/RSV assay is intended as an aid in  the diagnosis of influenza from Nasopharyngeal swab specimens and  should not be used as a sole basis for treatment. Nasal washings and  aspirates are unacceptable for Xpert Xpress SARS-CoV-2/FLU/RSV  testing.  Fact Sheet for Patients: PinkCheek.be  Fact Sheet for Healthcare Providers: GravelBags.it  This test is not yet approved or cleared by the [place] FDA and  has been authorized for detection and/or diagnosis of SARS-CoV-2 by  FDA under an Emergency Use Authorization (EUA). This EUA will remain  in effect (meaning this test can be used) for the duration of the  Covid-19 declaration under Section 564(b)(1) of the Act, 21  U.S.C. section 360bbb-3(b)(1), unless the authorization is  terminated or revoked. Performed at [facility], [address] [place], [place] [address]         Radiology Studies: [facility] 1 View  Result Date: 05/12/2020 CLINICAL DATA:  Shortness of breath EXAM: PORTABLE CHEST 1 VIEW COMPARISON:  May 01, 2017 FINDINGS: The heart size and mediastinal contours are within normal limits. There is prominence of the central pulmonary vasculature. Aortic knob calcifications are seen. No acute osseous abnormality. IMPRESSION: Pulmonary vascular congestion. Electronically Signed   By: Prudencio Pair M.D.   On: 05/12/2020 20:53   ECHOCARDIOGRAM COMPLETE  Result Date: 05/13/2020    ECHOCARDIOGRAM REPORT   Patient Name:   Maria Parrish Date of Exam: 05/13/2020 Medical Rec #:  [id number]    Height:       64.0 in Accession #:    [id number]   Weight:       155.0  lb Date of Birth:  March 09, 1946    BSA:          1.756 m Patient Age:    59 years     BP:           197/91 mmHg Patient Gender: F            HR:           68 bpm. Exam Location:  [facility] Procedure: 2D Echo Indications:    CHF-Acute Diastolic 737.10 / G26.94  History:        Patient has prior history of Echocardiogram examinations, most                 recent 09/23/2015. Risk Factors:Hypertension, Diabetes,                 Dyslipidemia and Non-Smoker. Dementia.  Sonographer:    Leavy Cella RDCS (AE) Referring Phys: [id number] DAVID MANUEL McGregor  1. Left ventricular ejection fraction, by estimation, is 40 to 45%. The left ventricle has mildly  decreased function. The left ventricle demonstrates global hypokinesis. The left ventricular internal cavity size was mildly dilated. There is mild left ventricular hypertrophy. Left ventricular diastolic parameters are consistent with Grade II diastolic dysfunction (pseudonormalization). Elevated left atrial pressure.  2. Right ventricular systolic function is mildly reduced. The right ventricular size is normal. There is moderately elevated pulmonary artery systolic pressure. The estimated right ventricular systolic pressure is 57.3 mmHg.  3. The mitral valve is grossly normal. Mild to moderate mitral valve regurgitation.  4. The aortic valve is tricuspid. Aortic valve regurgitation is not visualized. Mild aortic valve sclerosis is present, with no evidence of aortic valve stenosis.  5. The inferior vena cava is dilated in size with >50% respiratory variability, suggesting right atrial pressure of 8 mmHg. FINDINGS  Left Ventricle: Left ventricular ejection fraction, by estimation, is 40 to 45%. The left ventricle has mildly decreased function. The left ventricle demonstrates global hypokinesis. The left ventricular internal cavity size was mildly dilated. There is  mild left ventricular hypertrophy. Left ventricular diastolic parameters are consistent with Grade II  diastolic dysfunction (pseudonormalization). Elevated left atrial pressure. Right Ventricle: The right ventricular size is normal. No increase in right ventricular wall thickness. Right ventricular systolic function is mildly reduced. There is moderately elevated pulmonary artery systolic pressure. The tricuspid regurgitant velocity is 3.24 m/s, and with an assumed right atrial pressure of 8 mmHg, the estimated right ventricular systolic pressure is 22.0 mmHg. Left Atrium: Left atrial size was normal in size. Right Atrium: Right atrial size was normal in size. Pericardium: There is no evidence of pericardial effusion. Mitral Valve: The mitral valve is grossly normal. There is mild thickening of the mitral valve leaflet(s). Mild to moderate mitral valve regurgitation. Tricuspid Valve: The tricuspid valve is grossly normal. Tricuspid valve regurgitation is trivial. Aortic Valve: The aortic valve is tricuspid. There is mild to moderate aortic valve annular calcification. Aortic valve regurgitation is not visualized. Mild aortic valve sclerosis is present, with no evidence of aortic valve stenosis. Pulmonic Valve: The pulmonic valve was grossly normal. Pulmonic valve regurgitation is trivial. Aorta: The aortic root is normal in size and structure. Venous: The inferior vena cava is dilated in size with greater than 50% respiratory variability, suggesting right atrial pressure of 8 mmHg. IAS/Shunts: No atrial level shunt detected by color flow Doppler.  LEFT VENTRICLE PLAX 2D LVIDd:         5.23 cm  Diastology LVIDs:         4.10 cm  LV e' medial:    4.46 cm/s LV PW:         1.55 cm  LV E/e' medial:  19.5 LV IVS:        1.26 cm  LV e' lateral:   5.11 cm/s LVOT diam:     1.80 cm  LV E/e' lateral: 17.0 LVOT Area:     2.54 cm  RIGHT VENTRICLE RV S prime:     9.14 cm/s TAPSE (M-mode): 2.0 cm LEFT ATRIUM             Index       RIGHT ATRIUM           Index LA diam:        4.20 cm 2.39 cm/m  RA Area:     13.20 cm LA Vol  (A2C):   59.4 ml 33.84 ml/m RA Volume:   32.70 ml  18.63 ml/m LA Vol (A4C):   44.3 ml 25.23 ml/m LA Biplane Vol: 53.5  ml 30.48 ml/m   AORTA Ao Root diam: 2.40 cm MITRAL VALVE               TRICUSPID VALVE MV Area (PHT): 4.83 cm    TR Peak grad:   42.0 mmHg MV Decel Time: 157 msec    TR Vmax:        324.00 cm/s MR Peak grad: 111.5 mmHg MR Mean grad: 76.0 mmHg    SHUNTS MR Vmax:      528.00 cm/s  Systemic Diam: 1.80 cm MR Vmean:     411.0 cm/s MV E velocity: 87.00 cm/s MV A velocity: 63.40 cm/s MV E/A ratio:  1.37 Rozann Lesches MD Electronically signed by Rozann Lesches MD Signature Date/Time: 05/13/2020/4:17:50 PM    Final         Scheduled Meds: . amLODipine  5 mg Oral Daily  . aspirin EC  81 mg Oral Daily  . atorvastatin  40 mg Oral Daily  . enoxaparin (LOVENOX) injection  30 mg Subcutaneous Q24H  . ezetimibe  10 mg Oral Daily  . ferrous sulfate  325 mg Oral BID  . furosemide  40 mg Intravenous Daily  . gabapentin  300 mg Oral BID  . hydrALAZINE  25 mg Oral Q6H  . insulin aspart  0-9 Units Subcutaneous TID WC  . insulin glargine  22 Units Subcutaneous Daily  . isosorbide mononitrate  30 mg Oral Daily  . metoprolol tartrate  25 mg Oral BID  . potassium chloride  40 mEq Oral Q4H   Continuous Infusions:   LOS: 1 day     Cordelia Poche, MD [facility] 05/14/2020, 7:25 AM  If 7PM-7AM, please contact night-coverage [email or web address]

## 2020-05-15 LAB — GLUCOSE, CAPILLARY
Glucose-Capillary: 145 mg/dL — ABNORMAL HIGH (ref 70–99)
Glucose-Capillary: 245 mg/dL — ABNORMAL HIGH (ref 70–99)
Glucose-Capillary: 132 mg/dL — ABNORMAL HIGH (ref 70–99)
Glucose-Capillary: 164 mg/dL — ABNORMAL HIGH (ref 70–99)

## 2020-05-15 LAB — BASIC METABOLIC PANEL
Anion gap: 12 (ref 5–15)
BUN: 45 mg/dL — ABNORMAL HIGH (ref 8–23)
CO2: 19 mmol/L — ABNORMAL LOW (ref 22–32)
Calcium: 8.8 mg/dL — ABNORMAL LOW (ref 8.9–10.3)
Chloride: 107 mmol/L (ref 98–111)
Creatinine, Ser: 2.84 mg/dL — ABNORMAL HIGH (ref 0.44–1.00)
GFR, Estimated: 17 mL/min — ABNORMAL LOW (ref >60)
Glucose, Bld: 135 mg/dL — ABNORMAL HIGH (ref 70–99)
Potassium: 3.8 mmol/L (ref 3.5–5.1)
Sodium: 138 mmol/L (ref 135–145)

## 2020-05-15 LAB — CBC
HCT: 29 % — ABNORMAL LOW (ref 36.0–46.0)
Hemoglobin: 9.3 g/dL — ABNORMAL LOW (ref 12.0–15.0)
MCH: 29.7 pg (ref 26.0–34.0)
MCHC: 32.1 g/dL (ref 30.0–36.0)
MCV: 92.7 fL (ref 80.0–100.0)
Platelets: 364 10*3/uL (ref 150–400)
RBC: 3.13 MIL/uL — ABNORMAL LOW (ref 3.87–5.11)
RDW: 16.1 % — ABNORMAL HIGH (ref 11.5–15.5)
WBC: 7.4 10*3/uL (ref 4.0–10.5)
nRBC: 0 % (ref 0.0–0.2)

## 2020-05-15 NOTE — Progress Notes (Signed)
PROGRESS NOTE    Maria Parrish  LNL:[id number] DOB: [birth year] DOA: 05/12/2020 PCP: Vidal Schwalbe, MD   Brief Narrative: Maria Parrish is a 74 y.o.  female with medical history significant of difficulty ambulating, history of generalized weakness, confusion, history of dementia, type 2 diabetes, history of hypercalcemia, hyperparathyroidism, hypertension, hyperlipidemia, peripheral neuropathy. Patient presented secondary to a fall and found to have concern for a heart failure exacerbation. Given lasix. Transthoracic Echocardiogram ordered and pending.   Assessment & Plan:   Principal Problem:   Acute on chronic diastolic congestive heart failure (HCC) Active Problems:   Uncontrolled type 2 diabetes mellitus (HCC)   Hypokalemia   Essential hypertension, benign   Mixed hyperlipidemia   Dementia (HCC)   Left hemiparesis (HCC)   Elevated serum creatinine   Acute on chronic diastolic (congestive) heart failure (HCC)   Acute on chronic diastolic heart failure Acute systolic heart failure Last Transthoracic Echocardiogram from 2017. Significantly elevated BNP. Given Lasix 20 mg IV x1. Currently on room air with no current concerns about dyspnea. Transthoracic Echocardiogram significant for reduced EF of 40-45% and grade 2 diastolic dysfunction. Weight is up 3.5 kg from admission. Inaccurate in/out record -Cardiology consulted -Lasix 40 mg IV daily -Daily weights and strict in/out  AKI on CKD stage IIIb Assumed AKI. Last creatinine from 10 months ago was around 1.5. Creatinien of 2.62 on admission presumably in setting of fluid overload. Repeat BMP probably erroneously indicated a creatinine bump up to 8.64 but subsequent creatinine was 2.89. Creatinine trended down slightly. Patient appears hypervolemic. Per son, patient's PCP was referring patient to nephrologist -Diuresis as above   Diabetes mellitus, type 2 Patient is on Tresiba 22 units daily as an outpatient. Metformin listed  but apparently no longer taking.  Patient was not given Lantus on 10/28 for unknown reason -Continue Lantus 22 units daily and SSI  Essential hypertension Uncontrolled -Continue amlodipine, Imdur, hydralazine and metoprolol  Hyperlipidemia -Continue Lipitor and Zetia  Anemia Unknown chronicity. Stable while inpatient. Iron panel unremarkable. -CBC   Neuropathy -Continue gabapentin  Hypokalemia In setting of diuresis. Magnesium repleted. Resolved with repletion.  Hyperparathyroidism Noted on chart. Calcium normal.  Dementia Not fully oriented but appears to be stable.   DVT prophylaxis: Lovenox Code Status:   Code Status: Full Code Family Communication: None at bedside. Disposition Plan: Discharge to home in 1-2 days pending continued diuresis, improvement of weight and cardiology recommendations   Consultants:   Cardiology  Procedures:   TRANSTHORACIC ECHOCARDIOGRAM (10/28) IMPRESSIONS    1. Left ventricular ejection fraction, by estimation, is 40 to 45%. The  left ventricle has mildly decreased function. The left ventricle  demonstrates global hypokinesis. The left ventricular internal cavity size  was mildly dilated. There is mild left  ventricular hypertrophy. Left ventricular diastolic parameters are  consistent with Grade II diastolic dysfunction (pseudonormalization).  Elevated left atrial pressure.  2. Right ventricular systolic function is mildly reduced. The right  ventricular size is normal. There is moderately elevated pulmonary artery  systolic pressure. The estimated right ventricular systolic pressure is  40.8 mmHg.  3. The mitral valve is grossly normal. Mild to moderate mitral valve  regurgitation.  4. The aortic valve is tricuspid. Aortic valve regurgitation is not  visualized. Mild aortic valve sclerosis is present, with no evidence of  aortic valve stenosis.  5. The inferior vena cava is dilated in size with >50% respiratory    variability, suggesting right atrial pressure of 8 mmHg.   Antimicrobials:  None    Subjective: No dyspnea or chest pain  Objective: Vitals:   05/14/20 1604 05/14/20 2124 05/14/20 2359 05/15/20 0515  BP: 138/90 (!) 151/95 (!) 142/87 (!) 148/91  Pulse: 66 68 66 69  Resp: 18 16  18   Temp: 98.2 F (36.8 C) (!) 97.5 F (36.4 C)  98 F (36.7 C)  TempSrc:      SpO2: 100% 100%  100%  Weight:    73.8 kg  Height:        Intake/Output Summary (Last 24 hours) at 05/15/2020 0754 Last data filed at 05/14/2020 1700 Gross per 24 hour  Intake 980 ml  Output --  Net 980 ml   Filed Weights   05/12/20 1612 05/15/20 0515  Weight: 70.3 kg 73.8 kg    Examination:  General exam: Appears calm and comfortable Respiratory system: Clear to auscultation. Respiratory effort normal. Cardiovascular system: S1 & S2 heard, RRR. No murmurs, rubs, gallops or clicks. Gastrointestinal system: Abdomen is nondistended, soft and nontender. No organomegaly or masses felt. Normal bowel sounds heard. Central nervous system: Alert and oriented. No focal neurological deficits. Musculoskeletal: 2+ pitting edema. No calf tenderness Skin: No cyanosis. No rashes Psychiatry: Judgement and insight appear normal. Mood & affect appropriate.    Data Reviewed: I have personally reviewed following labs and imaging studies  CBC Lab Results  Component Value Date   WBC 8.1 05/13/2020   RBC 3.29 (L) 05/13/2020   HGB 9.5 (L) 05/13/2020   HCT 29.6 (L) 05/13/2020   MCV 90.0 05/13/2020   MCH 28.9 05/13/2020   PLT 371 05/13/2020   MCHC 32.1 05/13/2020   RDW 15.3 05/13/2020   LYMPHSABS 2.0 05/01/2017   MONOABS 0.5 05/01/2017   EOSABS 0.3 05/01/2017   BASOSABS 0.0 29/51/8841     Last metabolic panel Lab Results  Component Value Date   NA 138 05/15/2020   K 3.8 05/15/2020   CL 107 05/15/2020   CO2 19 (L) 05/15/2020   BUN 45 (H) 05/15/2020   CREATININE 2.84 (H) 05/15/2020   GLUCOSE 135 (H) 05/15/2020    GFRNONAA 17 (L) 05/15/2020   GFRAA 40 (L) 07/16/2019   CALCIUM 8.8 (L) 05/15/2020   PHOS 3.2 05/21/2017   PROT 7.4 05/13/2020   ALBUMIN 3.2 (L) 05/13/2020   BILITOT 0.9 05/13/2020   ALKPHOS 71 05/13/2020   AST 34 05/13/2020   ALT 50 (H) 05/13/2020   ANIONGAP 12 05/15/2020    CBG (last 3)  Recent Labs    05/14/20 1243 05/14/20 1721 05/14/20 2126  GLUCAP 252* 94 192*     GFR: Estimated Creatinine Clearance: 17.4 mL/min (A) (by C-G formula based on SCr of 2.84 mg/dL (H)).  Coagulation Profile: No results for input(s): INR, PROTIME in the last 168 hours.  Recent Results (from the past 240 hour(s))  Respiratory Panel by RT PCR (Flu A&B, Covid) - Nasopharyngeal Swab     Status: None   Collection Time: 05/12/20  7:33 PM   Specimen: Nasopharyngeal Swab  Result Value Ref Range Status   SARS Coronavirus 2 by RT PCR NEGATIVE NEGATIVE Final    Comment: (NOTE) SARS-CoV-2 target nucleic acids are NOT DETECTED.  The SARS-CoV-2 RNA is generally detectable in upper respiratoy specimens during the acute phase of infection. The lowest concentration of SARS-CoV-2 viral copies this assay can detect is 131 copies/mL. A negative result does not preclude SARS-Cov-2 infection and should not be used as the sole basis for treatment or other patient management decisions. A negative  result may occur with  improper specimen collection/handling, submission of specimen other than nasopharyngeal swab, presence of viral mutation(s) within the areas targeted by this assay, and inadequate number of viral copies (<131 copies/mL). A negative result must be combined with clinical observations, patient history, and epidemiological information. The expected result is Negative.  Fact Sheet for Patients:  PinkCheek.be  Fact Sheet for Healthcare Providers:  GravelBags.it  This test is no t yet approved or cleared by the [place] FDA and   has been authorized for detection and/or diagnosis of SARS-CoV-2 by FDA under an Emergency Use Authorization (EUA). This EUA will remain  in effect (meaning this test can be used) for the duration of the COVID-19 declaration under Section 564(b)(1) of the Act, 21 U.S.C. section 360bbb-3(b)(1), unless the authorization is terminated or revoked sooner.     Influenza A by PCR NEGATIVE NEGATIVE Final   Influenza B by PCR NEGATIVE NEGATIVE Final    Comment: (NOTE) The Xpert Xpress SARS-CoV-2/FLU/RSV assay is intended as an aid in  the diagnosis of influenza from Nasopharyngeal swab specimens and  should not be used as a sole basis for treatment. Nasal washings and  aspirates are unacceptable for Xpert Xpress SARS-CoV-2/FLU/RSV  testing.  Fact Sheet for Patients: PinkCheek.be  Fact Sheet for Healthcare Providers: GravelBags.it  This test is not yet approved or cleared by the [place] FDA and  has been authorized for detection and/or diagnosis of SARS-CoV-2 by  FDA under an Emergency Use Authorization (EUA). This EUA will remain  in effect (meaning this test can be used) for the duration of the  Covid-19 declaration under Section 564(b)(1) of the Act, 21  U.S.C. section 360bbb-3(b)(1), unless the authorization is  terminated or revoked. Performed at [facility], [address] [place], [place] [address]   Urine culture     Status: Abnormal   Collection Time: 05/12/20  7:57 PM   Specimen: Urine, Random  Result Value Ref Range Status   Specimen Description   Final    URINE, RANDOM Performed at [facility], [address] [place], [place] [address]    Special Requests   Final    NONE Performed at [facility], [address], [place], [place] [address]    Culture MULTIPLE SPECIES PRESENT, SUGGEST RECOLLECTION (A)  Final   Report Status 05/14/2020 FINAL  Final        Radiology Studies: ECHOCARDIOGRAM  COMPLETE  Result Date: 05/13/2020    ECHOCARDIOGRAM REPORT   Patient Name:   CACHE DECOURSEY Date of Exam: 05/13/2020 Medical Rec #:  [id number]    Height:       64.0 in Accession #:    [id number]   Weight:       155.0 lb Date of Birth:  [birth year]    BSA:          1.756 m Patient Age:    17 years     BP:           197/91 mmHg Patient Gender: F            HR:           68 bpm. Exam Location:  [facility] Procedure: 2D Echo Indications:    CHF-Acute Diastolic 160.73 / X10.62  History:        Patient has prior history of Echocardiogram examinations, most                 recent 09/23/2015. Risk Factors:Hypertension, Diabetes,  Dyslipidemia and Non-Smoker. Dementia.  Sonographer:    Leavy Cella RDCS (AE) Referring Phys: [id number] DAVID MANUEL [place]  1. Left ventricular ejection fraction, by estimation, is 40 to 45%. The left ventricle has mildly decreased function. The left ventricle demonstrates global hypokinesis. The left ventricular internal cavity size was mildly dilated. There is mild left ventricular hypertrophy. Left ventricular diastolic parameters are consistent with Grade II diastolic dysfunction (pseudonormalization). Elevated left atrial pressure.  2. Right ventricular systolic function is mildly reduced. The right ventricular size is normal. There is moderately elevated pulmonary artery systolic pressure. The estimated right ventricular systolic pressure is 29.5 mmHg.  3. The mitral valve is grossly normal. Mild to moderate mitral valve regurgitation.  4. The aortic valve is tricuspid. Aortic valve regurgitation is not visualized. Mild aortic valve sclerosis is present, with no evidence of aortic valve stenosis.  5. The inferior vena cava is dilated in size with >50% respiratory variability, suggesting right atrial pressure of 8 mmHg. FINDINGS  Left Ventricle: Left ventricular ejection fraction, by estimation, is 40 to 45%. The left ventricle has mildly decreased function. The left  ventricle demonstrates global hypokinesis. The left ventricular internal cavity size was mildly dilated. There is  mild left ventricular hypertrophy. Left ventricular diastolic parameters are consistent with Grade II diastolic dysfunction (pseudonormalization). Elevated left atrial pressure. Right Ventricle: The right ventricular size is normal. No increase in right ventricular wall thickness. Right ventricular systolic function is mildly reduced. There is moderately elevated pulmonary artery systolic pressure. The tricuspid regurgitant velocity is 3.24 m/s, and with an assumed right atrial pressure of 8 mmHg, the estimated right ventricular systolic pressure is 18.8 mmHg. Left Atrium: Left atrial size was normal in size. Right Atrium: Right atrial size was normal in size. Pericardium: There is no evidence of pericardial effusion. Mitral Valve: The mitral valve is grossly normal. There is mild thickening of the mitral valve leaflet(s). Mild to moderate mitral valve regurgitation. Tricuspid Valve: The tricuspid valve is grossly normal. Tricuspid valve regurgitation is trivial. Aortic Valve: The aortic valve is tricuspid. There is mild to moderate aortic valve annular calcification. Aortic valve regurgitation is not visualized. Mild aortic valve sclerosis is present, with no evidence of aortic valve stenosis. Pulmonic Valve: The pulmonic valve was grossly normal. Pulmonic valve regurgitation is trivial. Aorta: The aortic root is normal in size and structure. Venous: The inferior vena cava is dilated in size with greater than 50% respiratory variability, suggesting right atrial pressure of 8 mmHg. IAS/Shunts: No atrial level shunt detected by color flow Doppler.  LEFT VENTRICLE PLAX 2D LVIDd:         5.23 cm  Diastology LVIDs:         4.10 cm  LV e' medial:    4.46 cm/s LV PW:         1.55 cm  LV E/e' medial:  19.5 LV IVS:        1.26 cm  LV e' lateral:   5.11 cm/s LVOT diam:     1.80 cm  LV E/e' lateral: 17.0 LVOT  Area:     2.54 cm  RIGHT VENTRICLE RV S prime:     9.14 cm/s TAPSE (M-mode): 2.0 cm LEFT ATRIUM             Index       RIGHT ATRIUM           Index LA diam:        4.20 cm 2.39 cm/m  RA Area:  13.20 cm LA Vol (A2C):   59.4 ml 33.84 ml/m RA Volume:   32.70 ml  18.63 ml/m LA Vol (A4C):   44.3 ml 25.23 ml/m LA Biplane Vol: 53.5 ml 30.48 ml/m   AORTA Ao Root diam: 2.40 cm MITRAL VALVE               TRICUSPID VALVE MV Area (PHT): 4.83 cm    TR Peak grad:   42.0 mmHg MV Decel Time: 157 msec    TR Vmax:        324.00 cm/s MR Peak grad: 111.5 mmHg MR Mean grad: 76.0 mmHg    SHUNTS MR Vmax:      528.00 cm/s  Systemic Diam: 1.80 cm MR Vmean:     411.0 cm/s MV E velocity: 87.00 cm/s MV A velocity: 63.40 cm/s MV E/A ratio:  1.37 Rozann Lesches MD Electronically signed by Rozann Lesches MD Signature Date/Time: 05/13/2020/4:17:50 PM    Final         Scheduled Meds: . amLODipine  5 mg Oral Daily  . aspirin EC  81 mg Oral Daily  . atorvastatin  40 mg Oral Daily  . enoxaparin (LOVENOX) injection  30 mg Subcutaneous Q24H  . ezetimibe  10 mg Oral Daily  . ferrous sulfate  325 mg Oral BID  . furosemide  40 mg Intravenous Daily  . gabapentin  300 mg Oral BID  . hydrALAZINE  25 mg Oral Q6H  . insulin aspart  0-9 Units Subcutaneous TID WC  . insulin glargine  22 Units Subcutaneous Daily  . isosorbide mononitrate  30 mg Oral Daily  . metoprolol tartrate  25 mg Oral BID   Continuous Infusions:   LOS: 2 days     Cordelia Poche, MD [facility] 05/15/2020, 7:54 AM  If 7PM-7AM, please contact night-coverage [email or web address]

## 2020-05-16 LAB — GLUCOSE, CAPILLARY
Glucose-Capillary: 95 mg/dL (ref 70–99)
Glucose-Capillary: 153 mg/dL — ABNORMAL HIGH (ref 70–99)
Glucose-Capillary: 90 mg/dL (ref 70–99)
Glucose-Capillary: 155 mg/dL — ABNORMAL HIGH (ref 70–99)

## 2020-05-16 LAB — BASIC METABOLIC PANEL
Anion gap: 10 (ref 5–15)
BUN: 44 mg/dL — ABNORMAL HIGH (ref 8–23)
CO2: 19 mmol/L — ABNORMAL LOW (ref 22–32)
Calcium: 8.7 mg/dL — ABNORMAL LOW (ref 8.9–10.3)
Chloride: 108 mmol/L (ref 98–111)
Creatinine, Ser: 2.88 mg/dL — ABNORMAL HIGH (ref 0.44–1.00)
GFR, Estimated: 17 mL/min — ABNORMAL LOW (ref >60)
Glucose, Bld: 94 mg/dL (ref 70–99)
Potassium: 3.8 mmol/L (ref 3.5–5.1)
Sodium: 137 mmol/L (ref 135–145)

## 2020-05-16 NOTE — Progress Notes (Signed)
PROGRESS NOTE    Maria Parrish  HCW:[id number] DOB: 10-25-1945 DOA: 05/12/2020 PCP: Vidal Schwalbe, MD   Brief Narrative: Maria Parrish is a 74 y.o.  female with medical history significant of difficulty ambulating, history of generalized weakness, confusion, history of dementia, type 2 diabetes, history of hypercalcemia, hyperparathyroidism, hypertension, hyperlipidemia, peripheral neuropathy. Patient presented secondary to a fall and found to have concern for a heart failure exacerbation. Given lasix. Transthoracic Echocardiogram ordered and pending.   Assessment & Plan:   Principal Problem:   Acute on chronic diastolic congestive heart failure (HCC) Active Problems:   Uncontrolled type 2 diabetes mellitus (HCC)   Hypokalemia   Essential hypertension, benign   Mixed hyperlipidemia   Dementia (HCC)   Left hemiparesis (HCC)   Elevated serum creatinine   Acute on chronic diastolic (congestive) heart failure (HCC)   Acute on chronic diastolic heart failure Acute systolic heart failure Last Transthoracic Echocardiogram from 2017. Significantly elevated BNP. Given Lasix 20 mg IV x1. Currently on room air with no current concerns about dyspnea. Transthoracic Echocardiogram significant for reduced EF of 40-45% and grade 2 diastolic dysfunction. Weight is up 4 kg from admission. Inaccurate in/out record -Cardiology consulted -Lasix 40 mg IV daily -Daily weights (standing) and strict in/out  AKI on CKD stage IIIb Assumed AKI. Last creatinine from 10 months ago was around 1.5. Creatinien of 2.62 on admission presumably in setting of fluid overload. Repeat BMP probably erroneously indicated a creatinine bump up to 8.64 but subsequent creatinine was 2.89. Creatinine trended down slightly. Patient appears hypervolemic. Per son, patient's PCP was referring patient to nephrologist -Diuresis as above   Diabetes mellitus, type 2 Patient is on Tresiba 22 units daily as an outpatient. Metformin  listed but apparently no longer taking.  Patient was not given Lantus on 10/28 for unknown reason -Continue Lantus 22 units daily and SSI  Essential hypertension Uncontrolled -Continue amlodipine, Imdur, hydralazine and metoprolol  Hyperlipidemia -Continue Lipitor and Zetia  Anemia Unknown chronicity. Stable while inpatient. Iron panel unremarkable. -CBC   Neuropathy -Continue gabapentin  Hypokalemia In setting of diuresis. Magnesium repleted. Resolved with repletion.  Hyperparathyroidism Noted on chart. Calcium normal.  Dementia Not fully oriented but appears to be stable.   DVT prophylaxis: Lovenox Code Status:   Code Status: Full Code Family Communication: None at bedside. Son on telephone (2 minutes) Disposition Plan: Discharge to home in 1-2 days pending continued diuresis, improvement of weight and cardiology recommendations   Consultants:   Cardiology  Procedures:   TRANSTHORACIC ECHOCARDIOGRAM (10/28) IMPRESSIONS    1. Left ventricular ejection fraction, by estimation, is 40 to 45%. The  left ventricle has mildly decreased function. The left ventricle  demonstrates global hypokinesis. The left ventricular internal cavity size  was mildly dilated. There is mild left  ventricular hypertrophy. Left ventricular diastolic parameters are  consistent with Grade II diastolic dysfunction (pseudonormalization).  Elevated left atrial pressure.  2. Right ventricular systolic function is mildly reduced. The right  ventricular size is normal. There is moderately elevated pulmonary artery  systolic pressure. The estimated right ventricular systolic pressure is  17.6 mmHg.  3. The mitral valve is grossly normal. Mild to moderate mitral valve  regurgitation.  4. The aortic valve is tricuspid. Aortic valve regurgitation is not  visualized. Mild aortic valve sclerosis is present, with no evidence of  aortic valve stenosis.  5. The inferior vena cava is dilated in  size with >50% respiratory  variability, suggesting right atrial pressure of  8 mmHg.   Antimicrobials:  None    Subjective: Feeling of legs stinging.  Objective: Vitals:   05/15/20 2145 05/16/20 0055 05/16/20 0500 05/16/20 0611  BP: (!) 157/89 (!) 155/84  (!) 165/79  Pulse: 71 68  66  Resp: 18   18  Temp: 98 F (36.7 C)   97.8 F (36.6 C)  TempSrc: Oral   Oral  SpO2: 100%   99%  Weight:   74.3 kg   Height:        Intake/Output Summary (Last 24 hours) at 05/16/2020 0738 Last data filed at 05/16/2020 0000 Gross per 24 hour  Intake 240 ml  Output 300 ml  Net -60 ml   Filed Weights   05/12/20 1612 05/15/20 0515 05/16/20 0500  Weight: 70.3 kg 73.8 kg 74.3 kg    Examination:  General exam: Appears calm and comfortable  Respiratory system: Clear to auscultation. Respiratory effort normal. Cardiovascular system: S1 & S2 heard, RRR. No murmurs, rubs, gallops or clicks. Gastrointestinal system: Abdomen is nondistended, soft and nontender. No organomegaly or masses felt. Normal bowel sounds heard. Central nervous system: Alert and oriented to person and place. No focal neurological deficits. Musculoskeletal: 2+ BLE edema. No calf tenderness Skin: No cyanosis. No rashes Psychiatry: Judgement and insight appear normal. Mood & affect appropriate.    Data Reviewed: I have personally reviewed following labs and imaging studies  CBC Lab Results  Component Value Date   WBC 7.4 05/15/2020   RBC 3.13 (L) 05/15/2020   HGB 9.3 (L) 05/15/2020   HCT 29.0 (L) 05/15/2020   MCV 92.7 05/15/2020   MCH 29.7 05/15/2020   PLT 364 05/15/2020   MCHC 32.1 05/15/2020   RDW 16.1 (H) 05/15/2020   LYMPHSABS 2.0 05/01/2017   MONOABS 0.5 05/01/2017   EOSABS 0.3 05/01/2017   BASOSABS 0.0 16/04/9603     Last metabolic panel Lab Results  Component Value Date   NA 138 05/15/2020   K 3.8 05/15/2020   CL 107 05/15/2020   CO2 19 (L) 05/15/2020   BUN 45 (H) 05/15/2020   CREATININE 2.84  (H) 05/15/2020   GLUCOSE 135 (H) 05/15/2020   GFRNONAA 17 (L) 05/15/2020   GFRAA 40 (L) 07/16/2019   CALCIUM 8.8 (L) 05/15/2020   PHOS 3.2 05/21/2017   PROT 7.4 05/13/2020   ALBUMIN 3.2 (L) 05/13/2020   BILITOT 0.9 05/13/2020   ALKPHOS 71 05/13/2020   AST 34 05/13/2020   ALT 50 (H) 05/13/2020   ANIONGAP 12 05/15/2020    CBG (last 3)  Recent Labs    05/15/20 1622 05/15/20 2145 05/16/20 0735  GLUCAP 132* 164* 95     GFR: Estimated Creatinine Clearance: 17.4 mL/min (A) (by C-G formula based on SCr of 2.84 mg/dL (H)).  Coagulation Profile: No results for input(s): INR, PROTIME in the last 168 hours.  Recent Results (from the past 240 hour(s))  Respiratory Panel by RT PCR (Flu A&B, Covid) - Nasopharyngeal Swab     Status: None   Collection Time: 05/12/20  7:33 PM   Specimen: Nasopharyngeal Swab  Result Value Ref Range Status   SARS Coronavirus 2 by RT PCR NEGATIVE NEGATIVE Final    Comment: (NOTE) SARS-CoV-2 target nucleic acids are NOT DETECTED.  The SARS-CoV-2 RNA is generally detectable in upper respiratoy specimens during the acute phase of infection. The lowest concentration of SARS-CoV-2 viral copies this assay can detect is 131 copies/mL. A negative result does not preclude SARS-Cov-2 infection and should not be used as  the sole basis for treatment or other patient management decisions. A negative result may occur with  improper specimen collection/handling, submission of specimen other than nasopharyngeal swab, presence of viral mutation(s) within the areas targeted by this assay, and inadequate number of viral copies (<131 copies/mL). A negative result must be combined with clinical observations, patient history, and epidemiological information. The expected result is Negative.  Fact Sheet for Patients:  PinkCheek.be  Fact Sheet for Healthcare Providers:  GravelBags.it  This test is no t yet  approved or cleared by the [place] FDA and  has been authorized for detection and/or diagnosis of SARS-CoV-2 by FDA under an Emergency Use Authorization (EUA). This EUA will remain  in effect (meaning this test can be used) for the duration of the COVID-19 declaration under Section 564(b)(1) of the Act, 21 U.S.C. section 360bbb-3(b)(1), unless the authorization is terminated or revoked sooner.     Influenza A by PCR NEGATIVE NEGATIVE Final   Influenza B by PCR NEGATIVE NEGATIVE Final    Comment: (NOTE) The Xpert Xpress SARS-CoV-2/FLU/RSV assay is intended as an aid in  the diagnosis of influenza from Nasopharyngeal swab specimens and  should not be used as a sole basis for treatment. Nasal washings and  aspirates are unacceptable for Xpert Xpress SARS-CoV-2/FLU/RSV  testing.  Fact Sheet for Patients: PinkCheek.be  Fact Sheet for Healthcare Providers: GravelBags.it  This test is not yet approved or cleared by the [place] FDA and  has been authorized for detection and/or diagnosis of SARS-CoV-2 by  FDA under an Emergency Use Authorization (EUA). This EUA will remain  in effect (meaning this test can be used) for the duration of the  Covid-19 declaration under Section 564(b)(1) of the Act, 21  U.S.C. section 360bbb-3(b)(1), unless the authorization is  terminated or revoked. Performed at [facility], [address] [place], [place] [address]   Urine culture     Status: Abnormal   Collection Time: 05/12/20  7:57 PM   Specimen: Urine, Random  Result Value Ref Range Status   Specimen Description   Final    URINE, RANDOM Performed at [facility], [address] [place], [place] [address]    Special Requests   Final    NONE Performed at [facility], [address] [place], [place] [address]    Culture MULTIPLE SPECIES PRESENT, SUGGEST RECOLLECTION (A)  Final   Report Status 05/14/2020 FINAL  Final         Radiology Studies: No results found.      Scheduled Meds: . amLODipine  5 mg Oral Daily  . aspirin EC  81 mg Oral Daily  . atorvastatin  40 mg Oral Daily  . enoxaparin (LOVENOX) injection  30 mg Subcutaneous Q24H  . ezetimibe  10 mg Oral Daily  . ferrous sulfate  325 mg Oral BID  . furosemide  40 mg Intravenous Daily  . gabapentin  300 mg Oral BID  . hydrALAZINE  25 mg Oral Q6H  . insulin aspart  0-9 Units Subcutaneous TID WC  . insulin glargine  22 Units Subcutaneous Daily  . isosorbide mononitrate  30 mg Oral Daily  . metoprolol tartrate  25 mg Oral BID   Continuous Infusions:   LOS: 3 days     Cordelia Poche, MD [facility] 05/16/2020, 7:38 AM  If 7PM-7AM, please contact night-coverage [email or web address]

## 2020-05-17 ENCOUNTER — Inpatient Hospital Stay (HOSPITAL_COMMUNITY): Payer: Medicare HMO

## 2020-05-17 DIAGNOSIS — I5041 Acute combined systolic (congestive) and diastolic (congestive) heart failure: Secondary | ICD-10-CM | POA: Diagnosis not present

## 2020-05-17 DIAGNOSIS — N1832 Chronic kidney disease, stage 3b: Secondary | ICD-10-CM | POA: Diagnosis not present

## 2020-05-17 DIAGNOSIS — I10 Essential (primary) hypertension: Secondary | ICD-10-CM | POA: Diagnosis not present

## 2020-05-17 DIAGNOSIS — E119 Type 2 diabetes mellitus without complications: Secondary | ICD-10-CM

## 2020-05-17 DIAGNOSIS — F0390 Unspecified dementia without behavioral disturbance: Secondary | ICD-10-CM

## 2020-05-17 LAB — GLUCOSE, CAPILLARY
Glucose-Capillary: 80 mg/dL (ref 70–99)
Glucose-Capillary: 127 mg/dL — ABNORMAL HIGH (ref 70–99)
Glucose-Capillary: 79 mg/dL (ref 70–99)
Glucose-Capillary: 112 mg/dL — ABNORMAL HIGH (ref 70–99)
Glucose-Capillary: 120 mg/dL — ABNORMAL HIGH (ref 70–99)

## 2020-05-17 LAB — PROTEIN / CREATININE RATIO, URINE
Creatinine, Urine: 42.65 mg/dL
Protein Creatinine Ratio: 2.93 mg/mg{Cre} — ABNORMAL HIGH (ref 0.00–0.15)
Total Protein, Urine: 125 mg/dL

## 2020-05-17 LAB — BASIC METABOLIC PANEL
Anion gap: 8 (ref 5–15)
BUN: 45 mg/dL — ABNORMAL HIGH (ref 8–23)
CO2: 22 mmol/L (ref 22–32)
Calcium: 9.3 mg/dL (ref 8.9–10.3)
Chloride: 107 mmol/L (ref 98–111)
Creatinine, Ser: 2.83 mg/dL — ABNORMAL HIGH (ref 0.44–1.00)
GFR, Estimated: 17 mL/min — ABNORMAL LOW (ref >60)
Glucose, Bld: 73 mg/dL (ref 70–99)
Potassium: 3.8 mmol/L (ref 3.5–5.1)
Sodium: 137 mmol/L (ref 135–145)

## 2020-05-17 MED ORDER — FUROSEMIDE 10 MG/ML IJ SOLN
40.0000 mg | Freq: Once | INTRAMUSCULAR | Status: AC
  Administered 2020-05-17: 40 mg via INTRAVENOUS
  Filled 2020-05-17: qty 4

## 2020-05-17 MED ORDER — METOPROLOL SUCCINATE ER 25 MG PO TB24
25.0000 mg | ORAL_TABLET | Freq: Every day | ORAL | Status: DC
  Administered 2020-05-18 – 2020-05-21 (×4): 25 mg via ORAL
  Filled 2020-05-17 (×4): qty 1

## 2020-05-17 NOTE — TOC Progression Note (Signed)
Transition of Care [facility]) - Progression Note   Patient Details  Name: Maria Parrish MRN: [id number] Date of Birth: [birth year]  Transition of Care [facility]) CM/SW [place], LCSW Phone Number: 05/17/2020, 11:58 AM  Clinical Narrative: CSW spoke with patient's son, Lavera Vandermeer, regarding PCP appointment. Son requested afternoons. CSW called patient's PCP's office, Dr. Vidal Schwalbe, to schedule follow up appointment. Appointment scheduled for 05/31/20 at 3:15pm. Discharge summary to be faxed to [phone]. TOC to follow.  Expected Discharge Plan: [place] Barriers to Discharge: Continued Medical Work up  Expected Discharge Plan and Services Expected Discharge Plan: [place] In-house Referral: Clinical Social Work Post Acute Care Choice: [place] arrangements for the past 2 months: [place]: PT [place]: [place] ([facility])  Readmission Risk Interventions No flowsheet data found.

## 2020-05-17 NOTE — Progress Notes (Signed)
PROGRESS NOTE    Maria Parrish  LZJ:[id number] DOB: 1946-06-23 DOA: 05/12/2020 PCP: Vidal Schwalbe, MD   Brief Narrative: Maria Parrish is a 74 y.o.  female with medical history significant of difficulty ambulating, history of generalized weakness, confusion, history of dementia, type 2 diabetes, history of hypercalcemia, hyperparathyroidism, hypertension, hyperlipidemia, peripheral neuropathy. Patient presented secondary to a fall and found to have concern for a heart failure exacerbation. Given lasix. Transthoracic Echocardiogram ordered and pending.   Assessment & Plan:   Principal Problem:   Acute on chronic diastolic congestive heart failure (HCC) Active Problems:   Uncontrolled type 2 diabetes mellitus (HCC)   Hypokalemia   Essential hypertension, benign   Mixed hyperlipidemia   Dementia (HCC)   Left hemiparesis (HCC)   Elevated serum creatinine   Acute on chronic diastolic (congestive) heart failure (HCC)   Acute on chronic diastolic heart failure Acute systolic heart failure Last Transthoracic Echocardiogram from 2017. Significantly elevated BNP. Given Lasix 20 mg IV x1. Currently on room air with no current concerns about dyspnea. Transthoracic Echocardiogram significant for reduced EF of 40-45% and grade 2 diastolic dysfunction. Weight is up 2.6 kg from admission. Inaccurate in/out record -Cardiology recommendations: increased to Lasix 40 mg IV BID, recommending nephrology consult -Daily weights (standing) and strict in/out  AKI on CKD stage IIIb Assumed AKI. Last creatinine from 10 months ago was around 1.5. Creatinine of 2.62 on admission presumably in setting of fluid overload. Repeat BMP probably erroneously indicated a creatinine bump up to 8.64 but subsequent creatinine was 2.89. Creatinine trended down slightly but stabilized. Patient appears hypervolemic. Per son, patient's PCP was referring patient to nephrologist -Diuresis as above -Verify patient's baseline  creatinine with PCP (EHR is down today, 11/1)   Diabetes mellitus, type 2 Patient is on Tresiba 22 units daily as an outpatient. Metformin listed but apparently no longer taking.  Patient was not given Lantus on 10/28 for unknown reason -Continue Lantus 22 units daily and SSI  Essential hypertension Uncontrolled -Continue amlodipine, Imdur, hydralazine and metoprolol  Hyperlipidemia -Continue Lipitor and Zetia  Anemia Unknown chronicity. Stable while inpatient. Iron panel unremarkable. -CBC   Neuropathy -Continue gabapentin  Hypokalemia In setting of diuresis. Magnesium repleted. Resolved with repletion.  Hyperparathyroidism Noted on chart. Calcium normal.  Dementia Not fully oriented but appears to be stable.   DVT prophylaxis: Lovenox Code Status:   Code Status: Full Code Family Communication: None at bedside. Disposition Plan: Discharge to home in 1-2 days pending continued diuresis, improvement of weight and cardiology recommendations   Consultants:   Cardiology  Nephrology  Procedures:   TRANSTHORACIC ECHOCARDIOGRAM (10/28) IMPRESSIONS    1. Left ventricular ejection fraction, by estimation, is 40 to 45%. The  left ventricle has mildly decreased function. The left ventricle  demonstrates global hypokinesis. The left ventricular internal cavity size  was mildly dilated. There is mild left  ventricular hypertrophy. Left ventricular diastolic parameters are  consistent with Grade II diastolic dysfunction (pseudonormalization).  Elevated left atrial pressure.  2. Right ventricular systolic function is mildly reduced. The right  ventricular size is normal. There is moderately elevated pulmonary artery  systolic pressure. The estimated right ventricular systolic pressure is  02.4 mmHg.  3. The mitral valve is grossly normal. Mild to moderate mitral valve  regurgitation.  4. The aortic valve is tricuspid. Aortic valve regurgitation is not  visualized.  Mild aortic valve sclerosis is present, with no evidence of  aortic valve stenosis.  5. The inferior vena  cava is dilated in size with >50% respiratory  variability, suggesting right atrial pressure of 8 mmHg.   Antimicrobials:  None    Subjective: No dyspnea or chest pain.  Objective: Vitals:   05/16/20 2053 05/17/20 0013 05/17/20 0515 05/17/20 1200  BP: (!) 154/79 (!) 143/80 (!) 149/70 (!) 149/90  Pulse: 67 64 65   Resp: 18 16 16    Temp: 97.6 F (36.4 C) 98.3 F (36.8 C) 98.3 F (36.8 C)   TempSrc: Oral Oral Oral   SpO2: 99% 100% 100%   Weight:   72.6 kg   Height:        Intake/Output Summary (Last 24 hours) at 05/17/2020 1221 Last data filed at 05/17/2020 0600 Gross per 24 hour  Intake 360 ml  Output 1000 ml  Net -640 ml   Filed Weights   05/16/20 0500 05/16/20 1459 05/17/20 0515  Weight: 74.3 kg 71.8 kg 72.6 kg    Examination:  General exam: Appears calm and comfortable Respiratory system: Clear to auscultation. Respiratory effort normal. Cardiovascular system: S1 & S2 heard, RRR. No murmurs, rubs, gallops or clicks. Gastrointestinal system: Abdomen is nondistended, soft and nontender. No organomegaly or masses felt. Normal bowel sounds heard. Central nervous system: Alert and oriented to person and place. No focal neurological deficits. Musculoskeletal: 2+ BLE edema. No calf tenderness Skin: No cyanosis. No rashes Psychiatry: Judgement and insight appear normal. Mood & affect appropriate.    Data Reviewed: I have personally reviewed following labs and imaging studies  CBC Lab Results  Component Value Date   WBC 7.4 05/15/2020   RBC 3.13 (L) 05/15/2020   HGB 9.3 (L) 05/15/2020   HCT 29.0 (L) 05/15/2020   MCV 92.7 05/15/2020   MCH 29.7 05/15/2020   PLT 364 05/15/2020   MCHC 32.1 05/15/2020   RDW 16.1 (H) 05/15/2020   LYMPHSABS 2.0 05/01/2017   MONOABS 0.5 05/01/2017   EOSABS 0.3 05/01/2017   BASOSABS 0.0 78/29/5621     Last metabolic  panel Lab Results  Component Value Date   NA 137 05/17/2020   K 3.8 05/17/2020   CL 107 05/17/2020   CO2 22 05/17/2020   BUN 45 (H) 05/17/2020   CREATININE 2.83 (H) 05/17/2020   GLUCOSE 73 05/17/2020   GFRNONAA 17 (L) 05/17/2020   GFRAA 40 (L) 07/16/2019   CALCIUM 9.3 05/17/2020   PHOS 3.2 05/21/2017   PROT 7.4 05/13/2020   ALBUMIN 3.2 (L) 05/13/2020   BILITOT 0.9 05/13/2020   ALKPHOS 71 05/13/2020   AST 34 05/13/2020   ALT 50 (H) 05/13/2020   ANIONGAP 8 05/17/2020    CBG (last 3)  Recent Labs    05/16/20 2054 05/17/20 0734 05/17/20 1114  GLUCAP 155* 80 127*     GFR: Estimated Creatinine Clearance: 17.3 mL/min (A) (by C-G formula based on SCr of 2.83 mg/dL (H)).  Coagulation Profile: No results for input(s): INR, PROTIME in the last 168 hours.  Recent Results (from the past 240 hour(s))  Respiratory Panel by RT PCR (Flu A&B, Covid) - Nasopharyngeal Swab     Status: None   Collection Time: 05/12/20  7:33 PM   Specimen: Nasopharyngeal Swab  Result Value Ref Range Status   SARS Coronavirus 2 by RT PCR NEGATIVE NEGATIVE Final    Comment: (NOTE) SARS-CoV-2 target nucleic acids are NOT DETECTED.  The SARS-CoV-2 RNA is generally detectable in upper respiratoy specimens during the acute phase of infection. The lowest concentration of SARS-CoV-2 viral copies this assay can detect is  131 copies/mL. A negative result does not preclude SARS-Cov-2 infection and should not be used as the sole basis for treatment or other patient management decisions. A negative result may occur with  improper specimen collection/handling, submission of specimen other than nasopharyngeal swab, presence of viral mutation(s) within the areas targeted by this assay, and inadequate number of viral copies (<131 copies/mL). A negative result must be combined with clinical observations, patient history, and epidemiological information. The expected result is Negative.  Fact Sheet for Patients:   PinkCheek.be  Fact Sheet for Healthcare Providers:  GravelBags.it  This test is no t yet approved or cleared by the [place] FDA and  has been authorized for detection and/or diagnosis of SARS-CoV-2 by FDA under an Emergency Use Authorization (EUA). This EUA will remain  in effect (meaning this test can be used) for the duration of the COVID-19 declaration under Section 564(b)(1) of the Act, 21 U.S.C. section 360bbb-3(b)(1), unless the authorization is terminated or revoked sooner.     Influenza A by PCR NEGATIVE NEGATIVE Final   Influenza B by PCR NEGATIVE NEGATIVE Final    Comment: (NOTE) The Xpert Xpress SARS-CoV-2/FLU/RSV assay is intended as an aid in  the diagnosis of influenza from Nasopharyngeal swab specimens and  should not be used as a sole basis for treatment. Nasal washings and  aspirates are unacceptable for Xpert Xpress SARS-CoV-2/FLU/RSV  testing.  Fact Sheet for Patients: PinkCheek.be  Fact Sheet for Healthcare Providers: GravelBags.it  This test is not yet approved or cleared by the [place] FDA and  has been authorized for detection and/or diagnosis of SARS-CoV-2 by  FDA under an Emergency Use Authorization (EUA). This EUA will remain  in effect (meaning this test can be used) for the duration of the  Covid-19 declaration under Section 564(b)(1) of the Act, 21  U.S.C. section 360bbb-3(b)(1), unless the authorization is  terminated or revoked. Performed at [facility], [address], [place], [place] [address]   Urine culture     Status: Abnormal   Collection Time: 05/12/20  7:57 PM   Specimen: Urine, Random  Result Value Ref Range Status   Specimen Description   Final    URINE, RANDOM Performed at [facility], [address] [place], [place] [address]    Special Requests   Final    NONE Performed at [facility], [address] [place], [place] [address]    Culture MULTIPLE SPECIES PRESENT, SUGGEST RECOLLECTION (A)  Final   Report Status 05/14/2020 FINAL  Final        Radiology Studies: No results found.      Scheduled Meds: . amLODipine  5 mg Oral Daily  . aspirin EC  81 mg Oral Daily  . atorvastatin  40 mg Oral Daily  . enoxaparin (LOVENOX) injection  30 mg Subcutaneous Q24H  . ezetimibe  10 mg Oral Daily  . ferrous sulfate  325 mg Oral BID  . furosemide  40 mg Intravenous Daily  . furosemide  40 mg Intravenous Once  . gabapentin  300 mg Oral BID  . hydrALAZINE  25 mg Oral Q6H  . insulin aspart  0-9 Units Subcutaneous TID WC  . insulin glargine  22 Units Subcutaneous Daily  . isosorbide mononitrate  30 mg Oral Daily  . [START ON 05/18/2020] metoprolol succinate  25 mg Oral Daily  . metoprolol tartrate  25 mg Oral BID   Continuous Infusions:   LOS: 4 days     Cordelia Poche, MD  [facility] 05/17/2020, 12:21 PM  If 7PM-7AM, please contact night-coverage [email or web address]

## 2020-05-17 NOTE — Consult Note (Addendum)
Cardiology Consultation:   Patient ID: Maria Parrish MRN: [id number]; DOB: 11/23/1945  Admit date: 05/12/2020 Date of Consult: 05/17/2020  Primary Care Provider: Vidal Schwalbe, MD [facility] Cardiologist: No primary care provider on file. new [facility] Electrophysiologist:  None     Patient Profile:   Maria Parrish is a 74 y.o. female with a hx of HTN, DM2, diastolic CHF, HLD who is being seen today for the evaluation of new systolic CHF at the request of Dr. Lonny Prude.  History of Present Illness:   Maria Parrish is a 73 yo female with hx of HTN, HLD, DM2, dementia, mod protein calorie malnutrition, anemia,diastolic CHF who was admitted with acute systolic and diastolic CHF. Echo 05/13/20 new LVD EF 40-45% with grade 2 DD, AKI on CKD. Weight up 4 kg since admission, I/O's inaccurate on lasix 40 mg IV daily. Crt 2.83 today.  Patient says she thinks she's here because she had a stroke and her right hand hurt. She says she had a stoke in 2016. On further questioning she admits to leg swelling for several weeks and some dyspnea on exertion. She denies any chest pain, palpitations, dizziness or presyncope. Lives with her son, does eat frozen dinners and canned foods. Very inactive, doesn't drive. Walks around stores but sometimes gets tired and has to sit down. Says she passed out last year because of diabetes. Says her brother is 64 with CHF.   Past Medical History:  Diagnosis Date  . Assistance needed for mobility    walks with walker  . Confusion   . Dementia ([place])   . Diabetes mellitus without complication ([place])   . Generalized weakness   . Hypercalcemia   . Hyperlipidemia   . Hyperparathyroidism ([place])   . Hypertension   . Hypertension   . Moderate protein-calorie malnutrition ([place])   . Peripheral neuropathy   . UTI (lower urinary tract infection)     Past Surgical History:  Procedure Laterality Date  . COLONOSCOPY N/A 08/12/2015   Procedure: COLONOSCOPY;  Surgeon: Daneil Dolin, MD;  [facility]: [facility];  Service: Endoscopy;  Laterality: N/A;  1245pm  . ESOPHAGOGASTRODUODENOSCOPY N/A 08/12/2015   Procedure: ESOPHAGOGASTRODUODENOSCOPY (EGD);  Surgeon: Daneil Dolin, MD;  Location: [facility];  Service: Endoscopy;  Laterality: N/A;  . none       Home Medications:  Prior to Admission medications   Medication Sig Start Date End Date Taking? Authorizing Provider  amLODipine (NORVASC) 5 MG tablet Take by mouth.   Yes [provider]  aspirin EC 81 MG tablet Take 81 mg by mouth daily.   Yes [provider]  atorvastatin (LIPITOR) 40 MG tablet Take 40 mg by mouth daily.   Yes [provider]  ezetimibe (ZETIA) 10 MG tablet daily. 01/28/18  Yes [provider]  FEROSUL 325 (65 Fe) MG tablet Take 325 mg by mouth 2 (two) times daily. 02/08/20  Yes [provider]  gabapentin (NEURONTIN) 300 MG capsule Take 300 mg by mouth 2 (two) times daily. 08/19/18  Yes [provider]  insulin degludec (TRESIBA FLEXTOUCH) 100 UNIT/ML SOPN FlexTouch Pen Inject 0.22 mLs (22 Units total) into the skin daily. 07/14/19  Yes Nida, Marella Chimes, MD  metoprolol tartrate (LOPRESSOR) 25 MG tablet Take 25 mg by mouth 2 (two) times daily. 05/07/20  Yes [provider]  olmesartan-hydrochlorothiazide (BENICAR HCT) 40-25 MG tablet daily. 05/03/18  Yes [provider]  polyethylene glycol (MIRALAX / GLYCOLAX) packet Take 17  g by mouth daily as needed for mild constipation. 09/19/15  Yes Nita Sells, MD  potassium chloride (KLOR-CON) 10 MEQ tablet Take by mouth.   Yes [provider]  spironolactone (ALDACTONE) 25 MG tablet Take by mouth. 01/08/20  Yes [provider]  Vitamin D, Ergocalciferol, (DRISDOL) 50000 units CAPS capsule Take 1 capsule (50,000 Units total) by mouth every 14 (fourteen) days. 02/26/17  Yes Nida, Marella Chimes, MD  Blood Glucose Monitoring Suppl (ACCU-CHEK GUIDE ME) w/Device  KIT 1 Piece by Does not apply route as directed. 09/02/18   Cassandria Anger, MD  Cyanocobalamin (VITAMIN B-12 PO) Take 1 tablet by mouth daily.    [provider]  folic acid (FOLVITE) 1 MG tablet Take 1 tablet (1 mg total) by mouth daily. Patient not taking: Reported on 05/12/2020 09/19/15   Nita Sells, MD  glucose blood (ACCU-CHEK GUIDE) test strip Use as instructed 4 x daily. E11.65 09/03/18   Cassandria Anger, MD  Insulin Pen Needle (B-D ULTRAFINE III SHORT PEN) 31G X 8 MM MISC 1 each by Does not apply route as directed. 09/02/18   Cassandria Anger, MD  metFORMIN (GLUCOPHAGE) 500 MG tablet Take 1 tablet (500 mg total) by mouth 2 (two) times daily with a meal. Patient not taking: Reported on 05/12/2020 09/02/18   Cassandria Anger, MD    Inpatient Medications: Scheduled Meds: . amLODipine  5 mg Oral Daily  . aspirin EC  81 mg Oral Daily  . atorvastatin  40 mg Oral Daily  . enoxaparin (LOVENOX) injection  30 mg Subcutaneous Q24H  . ezetimibe  10 mg Oral Daily  . ferrous sulfate  325 mg Oral BID  . furosemide  40 mg Intravenous Daily  . gabapentin  300 mg Oral BID  . hydrALAZINE  25 mg Oral Q6H  . insulin aspart  0-9 Units Subcutaneous TID WC  . insulin glargine  22 Units Subcutaneous Daily  . isosorbide mononitrate  30 mg Oral Daily  . metoprolol tartrate  25 mg Oral BID   Continuous Infusions:  PRN Meds: acetaminophen **OR** acetaminophen, ondansetron **OR** ondansetron (ZOFRAN) IV, polyethylene glycol  Allergies:    Allergies  Allergen Reactions  . Penicillins Other (See Comments)    Makes patient sore.  Has patient had a PCN reaction causing immediate rash, facial/tongue/throat swelling, SOB or lightheadedness with hypotension: No Has patient had a PCN reaction causing severe rash involving mucus membranes or skin necrosis: No Has patient had a PCN reaction that required hospitalization No Has patient had a PCN reaction occurring within  the last 10 years: No If all of the above answers are "NO", then may proceed with Cephalosporin use.   . Statins Other (See Comments)    Makes wrists and fingers ache    Social History:   Social History   Socioeconomic History  . Marital status: Widowed    Spouse name: Not on file  . Number of children: 2  . Years of education: Not on file  . Highest education level: Not on file  Occupational History  . Occupation: Nutritional therapist for [employer] Use  . Smoking status: Never Smoker  . Smokeless tobacco: Never Used  Vaping Use  . Vaping Use: Never used  Substance and Sexual Activity  . Alcohol use: No  . Drug use: No  . Sexual activity: Not Currently  Other Topics Concern  . Not on file  Social History Narrative  . Not on file   Social Determinants of  Health   Financial Resource Strain:   . Difficulty of Paying Living Expenses: Not on file  Food Insecurity:   . Worried About Charity fundraiser in the Last Year: Not on file  . Ran Out of Food in the Last Year: Not on file  Transportation Needs:   . Lack of Transportation (Medical): Not on file  . Lack of Transportation (Non-Medical): Not on file  Physical Activity:   . Days of Exercise per Week: Not on file  . Minutes of Exercise per Session: Not on file  Stress:   . Feeling of Stress : Not on file  Social Connections:   . Frequency of Communication with Friends and Family: Not on file  . Frequency of Social Gatherings with Friends and Family: Not on file  . Attends Religious Services: Not on file  . Active Member of Clubs or Organizations: Not on file  . Attends Archivist Meetings: Not on file  . Marital Status: Not on file  Intimate Partner Violence:   . Fear of Current or Ex-Partner: Not on file  . Emotionally Abused: Not on file  . Physically Abused: Not on file  . Sexually Abused: Not on file    Family History:     Family History  Problem Relation Age of Onset  . Hypertension Brother   .  Hypertension Brother   . Diabetes Brother   . Diabetes Father   . Heart attack Mother   . Colon cancer Neg Hx      ROS:  Please see the history of present illness.  Review of Systems  Unable to perform ROS: dementia  Constitutional: Positive for malaise/fatigue.  HENT: Negative.   Eyes: Negative.   Cardiovascular: Positive for dyspnea on exertion and leg swelling.  Respiratory: Negative.   Hematologic/Lymphatic: Negative.   Musculoskeletal: Negative.  Negative for joint pain.  Gastrointestinal: Negative.   Genitourinary: Negative.   Neurological: Positive for weakness.    All other ROS reviewed and negative.     Physical Exam/Data:   Vitals:   05/16/20 1648 05/16/20 2053 05/17/20 0013 05/17/20 0515  BP: (!) 151/73 (!) 154/79 (!) 143/80 (!) 149/70  Pulse: 62 67 64 65  Resp:  '18 16 16  ' Temp:  97.6 F (36.4 C) 98.3 F (36.8 C) 98.3 F (36.8 C)  TempSrc:  Oral Oral Oral  SpO2:  99% 100% 100%  Weight:    72.6 kg  Height:        Intake/Output Summary (Last 24 hours) at 05/17/2020 0855 Last data filed at 05/17/2020 0600 Gross per 24 hour  Intake 600 ml  Output 1000 ml  Net -400 ml   Last 3 Weights 05/17/2020 05/16/2020 05/16/2020  Weight (lbs) 160 lb 158 lb 6.4 oz 163 lb 12.8 oz  Weight (kg) 72.576 kg 71.85 kg 74.3 kg     Body mass index is 27.46 kg/m.  General:  Well nourished, well developed, in no acute distress  HEENT: normal Lymph: no adenopathy Neck: no JVD Endocrine:  No thryomegaly Vascular: No carotid bruits; FA pulses 2+ bilaterally without bruits  Cardiac:  normal S1, S2; RRR; 9-3/2 systolic murmur apex Lungs:  Decreased breath sounds with crackles at bases  Abd: soft, nontender, no hepatomegaly  Ext: plus 1 edema Musculoskeletal:  No deformities, BUE and BLE strength normal and equal Skin: warm and dry  Neuro:  Confused at times-dementia Psych:  Normal affect   EKG:  The EKG was personally reviewed and demonstrates:  NSR  with PVC, PAC's, sinus  arrhythmia, poor R wave progression ant new from 2018 Telemetry:  Telemetry was personally reviewed and demonstrates:  SB-NSR with sinus arrhythmia, ? Run of mobitz type 1 AV block vs afib?  Relevant CV Studies:  TRANSTHORACIC ECHOCARDIOGRAM (10/28) IMPRESSIONS     1. Left ventricular ejection fraction, by estimation, is 40 to 45%. The  left ventricle has mildly decreased function. The left ventricle  demonstrates global hypokinesis. The left ventricular internal cavity size  was mildly dilated. There is mild left  ventricular hypertrophy. Left ventricular diastolic parameters are  consistent with Grade II diastolic dysfunction (pseudonormalization).  Elevated left atrial pressure.   2. Right ventricular systolic function is mildly reduced. The right  ventricular size is normal. There is moderately elevated pulmonary artery  systolic pressure. The estimated right ventricular systolic pressure is  61.9 mmHg.   3. The mitral valve is grossly normal. Mild to moderate mitral valve  regurgitation.   4. The aortic valve is tricuspid. Aortic valve regurgitation is not  visualized. Mild aortic valve sclerosis is present, with no evidence of  aortic valve stenosis.   5. The inferior vena cava is dilated in size with >50% respiratory  variability, suggesting right atrial pressure of 8 mmHg.      Laboratory Data:  High Sensitivity Troponin:   Recent Labs  Lab 05/12/20 1652 05/13/20 0028  TROPONINIHS 12 13     Chemistry Recent Labs  Lab 05/15/20 0550 05/16/20 0546 05/17/20 0424  NA 138 137 137  K 3.8 3.8 3.8  CL 107 108 107  CO2 19* 19* 22  GLUCOSE 135* 94 73  BUN 45* 44* 45*  CREATININE 2.84* 2.88* 2.83*  CALCIUM 8.8* 8.7* 9.3  GFRNONAA 17* 17* 17*  ANIONGAP '12 10 8    ' Recent Labs  Lab 05/13/20 0028  PROT 7.4  ALBUMIN 3.2*  AST 34  ALT 50*  ALKPHOS 71  BILITOT 0.9   Hematology Recent Labs  Lab 05/12/20 1652 05/13/20 0028 05/15/20 0550  WBC 8.6 8.1 7.4    RBC 3.37* 3.29* 3.13*  HGB 9.7* 9.5* 9.3*  HCT 29.3* 29.6* 29.0*  MCV 86.9 90.0 92.7  MCH 28.8 28.9 29.7  MCHC 33.1 32.1 32.1  RDW 15.2 15.3 16.1*  PLT 375 371 364   BNP Recent Labs  Lab 05/12/20 1652  BNP 1,352.0*    DDimer No results for input(s): DDIMER in the last 168 hours.   Radiology/Studies:  ECHOCARDIOGRAM COMPLETE  Result Date: 05/13/2020    ECHOCARDIOGRAM REPORT   Patient Name:   MARIGRACE MCCOLE Date of Exam: 05/13/2020 Medical Rec #:  [id number]    Height:       64.0 in Accession #:    [id number]   Weight:       155.0 lb Date of Birth:  1946/03/18    BSA:          1.756 m Patient Age:    89 years     BP:           197/91 mmHg Patient Gender: F            HR:           68 bpm. Exam Location:  [facility] Procedure: 2D Echo Indications:    CHF-Acute Diastolic 250.53 / Z76.73  History:        Patient has prior history of Echocardiogram examinations, most  recent 09/23/2015. Risk Factors:Hypertension, Diabetes,                 Dyslipidemia and Non-Smoker. Dementia.  Sonographer:    Leavy Cella RDCS (AE) Referring Phys: [id number] DAVID MANUEL [place]  1. Left ventricular ejection fraction, by estimation, is 40 to 45%. The left ventricle has mildly decreased function. The left ventricle demonstrates global hypokinesis. The left ventricular internal cavity size was mildly dilated. There is mild left ventricular hypertrophy. Left ventricular diastolic parameters are consistent with Grade II diastolic dysfunction (pseudonormalization). Elevated left atrial pressure.  2. Right ventricular systolic function is mildly reduced. The right ventricular size is normal. There is moderately elevated pulmonary artery systolic pressure. The estimated right ventricular systolic pressure is 75.9 mmHg.  3. The mitral valve is grossly normal. Mild to moderate mitral valve regurgitation.  4. The aortic valve is tricuspid. Aortic valve regurgitation is not visualized. Mild aortic valve  sclerosis is present, with no evidence of aortic valve stenosis.  5. The inferior vena cava is dilated in size with >50% respiratory variability, suggesting right atrial pressure of 8 mmHg. FINDINGS  Left Ventricle: Left ventricular ejection fraction, by estimation, is 40 to 45%. The left ventricle has mildly decreased function. The left ventricle demonstrates global hypokinesis. The left ventricular internal cavity size was mildly dilated. There is  mild left ventricular hypertrophy. Left ventricular diastolic parameters are consistent with Grade II diastolic dysfunction (pseudonormalization). Elevated left atrial pressure. Right Ventricle: The right ventricular size is normal. No increase in right ventricular wall thickness. Right ventricular systolic function is mildly reduced. There is moderately elevated pulmonary artery systolic pressure. The tricuspid regurgitant velocity is 3.24 m/s, and with an assumed right atrial pressure of 8 mmHg, the estimated right ventricular systolic pressure is 16.3 mmHg. Left Atrium: Left atrial size was normal in size. Right Atrium: Right atrial size was normal in size. Pericardium: There is no evidence of pericardial effusion. Mitral Valve: The mitral valve is grossly normal. There is mild thickening of the mitral valve leaflet(s). Mild to moderate mitral valve regurgitation. Tricuspid Valve: The tricuspid valve is grossly normal. Tricuspid valve regurgitation is trivial. Aortic Valve: The aortic valve is tricuspid. There is mild to moderate aortic valve annular calcification. Aortic valve regurgitation is not visualized. Mild aortic valve sclerosis is present, with no evidence of aortic valve stenosis. Pulmonic Valve: The pulmonic valve was grossly normal. Pulmonic valve regurgitation is trivial. Aorta: The aortic root is normal in size and structure. Venous: The inferior vena cava is dilated in size with greater than 50% respiratory variability, suggesting right atrial  pressure of 8 mmHg. IAS/Shunts: No atrial level shunt detected by color flow Doppler.  LEFT VENTRICLE PLAX 2D LVIDd:         5.23 cm  Diastology LVIDs:         4.10 cm  LV e' medial:    4.46 cm/s LV PW:         1.55 cm  LV E/e' medial:  19.5 LV IVS:        1.26 cm  LV e' lateral:   5.11 cm/s LVOT diam:     1.80 cm  LV E/e' lateral: 17.0 LVOT Area:     2.54 cm  RIGHT VENTRICLE RV S prime:     9.14 cm/s TAPSE (M-mode): 2.0 cm LEFT ATRIUM             Index       RIGHT ATRIUM  Index LA diam:        4.20 cm 2.39 cm/m  RA Area:     13.20 cm LA Vol (A2C):   59.4 ml 33.84 ml/m RA Volume:   32.70 ml  18.63 ml/m LA Vol (A4C):   44.3 ml 25.23 ml/m LA Biplane Vol: 53.5 ml 30.48 ml/m   AORTA Ao Root diam: 2.40 cm MITRAL VALVE               TRICUSPID VALVE MV Area (PHT): 4.83 cm    TR Peak grad:   42.0 mmHg MV Decel Time: 157 msec    TR Vmax:        324.00 cm/s MR Peak grad: 111.5 mmHg MR Mean grad: 76.0 mmHg    SHUNTS MR Vmax:      528.00 cm/s  Systemic Diam: 1.80 cm MR Vmean:     411.0 cm/s MV E velocity: 87.00 cm/s MV A velocity: 63.40 cm/s MV E/A ratio:  1.37 Rozann Lesches MD Electronically signed by Rozann Lesches MD Signature Date/Time: 05/13/2020/4:17:50 PM    Final      Assessment and Plan:   1. Acute combined systolic or diastolic CHF-new systolic dysfunction-echo 05/13/20 LVEF 40-45% global HK with grade 2 DD, mild-mod MR-I/O's inaccurate, weight up ? Accuracy on Lasix 40 mg IV daily. Limited b/c of AKI. With new LVD would try to treat medically and not pursue stress test or cath with current renal function and dementia. Will discuss pushing lasix with MD 2. AKI on CKD stage 3b-Crt 2.83-was on benicar and spironolactone PTA and both on hold. Was to see nephrologist as OP-would ask to see as IP.  3. HTN BP running high on Norvasc 10 mg(increased dose), Hydralazine 25 mg daily, Imdur 30 mg daily, metoprolol 25 mg bid 4. HLD on Zetia 10 mg daily 5. DM type 2 6. Anemia 7. Dementia         New York Heart Association (NYHA) Functional Class NYHA Class III        For questions or updates, please contact [facility] Please consult [email or web address] for contact info under    Signed, Ermalinda Barrios, PA-C  05/17/2020 8:55 AM   Attending note Patient seen and discussed with PA Bonnell Public, I agree with her documentation. 74 yo female history of dementia, DM2, HTN, HL, CKD admitted with fall and altered mental status. Found to have evidence of volume overload and heart failure, cardiology consulted   Admit labs K 2.6 Cr 2.62 BUN 37 WBC 8.6 Hgb 9.7 Plt 375 BNP 1352 Mg 1.6  hstrop 12-->13 COVID neg CXR pulm congestion EKG SR, PAC, PVC. Nonspecific ST/T changes 04/2020 echo LVEF 40-45%, grade II DDx, mild RV dysfunction, PASP 50, mild to mod MR,   Admitted with clinical signs of heart failure, echo shows mild LV systolic dysfunction, mild RV dysfunction, grade II diastolic dysfunction. I/Os are incomplete, she is on IV lasix 91m daily.  Baseline Cr around 1.5, 2.6 on admission. Suspect lab error as on 10/28 Cr reported at 8.6, later that day was 2.89, has held stable around 2.8. Weights appear inaccurate. Would recommend a nephrology evaluation for AKI on CKD. I agree with echo read on mild LV systolic dysfunction, grade II diastolic dysfunction, I would read RV function as normal. We do not see the typical cardiac findings for such an acute renal insult such as severe LV or RV dysfunction.   Remains volume overload, try IV lasix 412mbid today and follow renal function closely. Convert  lopressor to oral toprol 87m daily, continue hydral/nitrates, no ACE/ARB/ARNI due to renal dysfunction   JCarlyle DollyMD

## 2020-05-18 DIAGNOSIS — N1832 Chronic kidney disease, stage 3b: Secondary | ICD-10-CM | POA: Diagnosis not present

## 2020-05-18 DIAGNOSIS — E119 Type 2 diabetes mellitus without complications: Secondary | ICD-10-CM | POA: Diagnosis not present

## 2020-05-18 DIAGNOSIS — I10 Essential (primary) hypertension: Secondary | ICD-10-CM | POA: Diagnosis not present

## 2020-05-18 DIAGNOSIS — I5041 Acute combined systolic (congestive) and diastolic (congestive) heart failure: Secondary | ICD-10-CM | POA: Diagnosis not present

## 2020-05-18 DIAGNOSIS — I340 Nonrheumatic mitral (valve) insufficiency: Secondary | ICD-10-CM

## 2020-05-18 LAB — GLUCOSE, CAPILLARY
Glucose-Capillary: 95 mg/dL (ref 70–99)
Glucose-Capillary: 159 mg/dL — ABNORMAL HIGH (ref 70–99)
Glucose-Capillary: 114 mg/dL — ABNORMAL HIGH (ref 70–99)
Glucose-Capillary: 168 mg/dL — ABNORMAL HIGH (ref 70–99)

## 2020-05-18 LAB — IRON AND TIBC
Iron: 31 ug/dL (ref 28–170)
Saturation Ratios: 13 % (ref 10.4–31.8)
TIBC: 235 ug/dL — ABNORMAL LOW (ref 250–450)
UIBC: 204 ug/dL

## 2020-05-18 LAB — BASIC METABOLIC PANEL
Anion gap: 9 (ref 5–15)
BUN: 49 mg/dL — ABNORMAL HIGH (ref 8–23)
CO2: 23 mmol/L (ref 22–32)
Calcium: 9.2 mg/dL (ref 8.9–10.3)
Chloride: 106 mmol/L (ref 98–111)
Creatinine, Ser: 3.05 mg/dL — ABNORMAL HIGH (ref 0.44–1.00)
GFR, Estimated: 16 mL/min — ABNORMAL LOW (ref >60)
Glucose, Bld: 89 mg/dL (ref 70–99)
Potassium: 3.8 mmol/L (ref 3.5–5.1)
Sodium: 138 mmol/L (ref 135–145)

## 2020-05-18 LAB — FERRITIN: Ferritin: 83 ng/mL (ref 11–307)

## 2020-05-18 MED ORDER — FUROSEMIDE 10 MG/ML IJ SOLN
40.0000 mg | Freq: Two times a day (BID) | INTRAMUSCULAR | Status: DC
  Administered 2020-05-18 – 2020-05-19 (×3): 40 mg via INTRAVENOUS
  Filled 2020-05-18 (×3): qty 4

## 2020-05-18 MED ORDER — HYDRALAZINE HCL 25 MG PO TABS
50.0000 mg | ORAL_TABLET | Freq: Three times a day (TID) | ORAL | Status: DC
  Administered 2020-05-18 – 2020-05-21 (×9): 50 mg via ORAL
  Filled 2020-05-18 (×10): qty 2

## 2020-05-18 MED ORDER — POTASSIUM CHLORIDE CRYS ER 20 MEQ PO TBCR
20.0000 meq | EXTENDED_RELEASE_TABLET | Freq: Two times a day (BID) | ORAL | Status: DC
  Administered 2020-05-18 – 2020-05-19 (×4): 20 meq via ORAL
  Filled 2020-05-18 (×5): qty 1

## 2020-05-18 MED ORDER — DARBEPOETIN ALFA 100 MCG/0.5ML IJ SOSY
100.0000 ug | PREFILLED_SYRINGE | INTRAMUSCULAR | Status: DC
  Administered 2020-05-18: 100 ug via SUBCUTANEOUS
  Filled 2020-05-18: qty 0.5

## 2020-05-18 NOTE — Progress Notes (Signed)
Occupational Therapy Treatment Patient Details Name: Maria Parrish MRN: [id number] DOB: May 03, 1946 Today's Date: 05/18/2020    History of present illness Rochel KARALYNE NUSSER is a 74 y.o. female with medical history significant of difficulty ambulating, history of generalized weakness, confusion, history of dementia, type 2 diabetes, history of hypercalcemia, hyperparathyroidism, hypertension, hyperlipidemia, peripheral neuropathy, history of UTI, history of moderate protein calorie malnutrition who is coming to the emergency department after having a fall at home.  She is disoriented to time, date, situation and place.   OT comments  Pt agreeable to OT session this am. Pt using RW due to unsteadiness this am, pt performing grooming tasks at sink maintaining standing for approximately 4 minutes before requesting to sit due to BLE weakness. Pt requiring UE support for standing at sink, using left dominant UE for tasks and RUE for support. Pt requiring min guard/min assist for mobility tasks. Continue to recommend SNF on discharge. Per chart review family requesting [place] services, if pt discharges with [facility] will need [place] OT.    Follow Up Recommendations  SNF    Equipment Recommendations  None recommended by OT       Precautions / Restrictions Precautions Precautions: Fall       Mobility Bed Mobility Overal bed mobility: Needs Assistance Bed Mobility: Supine to Sit     Supine to sit: Supervision        Transfers Overall transfer level: Needs assistance Equipment used: Rolling walker (2 wheeled) Transfers: Sit to/from [facility] Sit to Stand: Min assist Stand pivot transfers: Min guard                ADL either performed or assessed with clinical judgement   ADL Overall ADL's : Needs assistance/impaired     Grooming: Wash/dry hands;Wash/dry face;Minimal assistance;Standing Grooming Details (indicate cue type and reason): Pt standing at sink for grooming tasks, min  guard to min assist for maintaining standing due to BLE weakness. Pt requiring on UE for holding onto RW and using LUE for washing face. Unable to maintain balance without UE support                 Toilet Transfer: Min guard;Ambulation;Grab bars;Regular Toilet           Functional mobility during ADLs: Min guard;Rolling walker                 Cognition Arousal/Alertness: Awake/alert Behavior During Therapy: WFL for tasks assessed/performed Overall Cognitive Status: Within Functional Limits for tasks assessed                                                     Pertinent Vitals/ Pain       Pain Assessment: No/denies pain         Frequency  Min 2X/week        Progress Toward Goals  OT Goals(current goals can now be found in the care plan section)  Progress towards OT goals: Progressing toward goals  Acute Rehab OT Goals Patient Stated Goal: To get stronger and go home OT Goal Formulation: With patient Time For Goal Achievement: 05/27/20 Potential to Achieve Goals: Good ADL Goals Pt Will Perform Grooming: with supervision;standing Pt Will Perform Upper Body Dressing: with supervision;sitting Pt Will Perform Lower Body Dressing: with supervision;sitting/lateral leans;sit to/from stand Pt Will Transfer to Toilet:  with supervision;ambulating;regular height toilet Pt Will Perform Toileting - Clothing Manipulation and hygiene: with supervision;sitting/lateral leans;sit to/from stand Pt/caregiver will Perform Home Exercise Program: Increased strength;Both right and left upper extremity;Independently;With written HEP provided  Plan Discharge plan remains appropriate          End of Session Equipment Utilized During Treatment: Gait belt;Rolling walker  OT Visit Diagnosis: Repeated falls (R29.6);Muscle weakness (generalized) (M62.81)   Activity Tolerance Patient tolerated treatment well   Patient Left in chair;with call bell/phone within  reach;with chair alarm set   Nurse Communication          Time: [phone] OT Time Calculation (min): 21 min  Charges: OT General Charges $OT Visit: 1 Visit OT Treatments $Self Care/Home Management : 8-22 mins    Guadelupe Sabin, OTR/L  [phone] 05/18/2020, 8:44 AM

## 2020-05-18 NOTE — Consult Note (Signed)
[facility] Renal Consultation Note  Requesting MD: nettey Indication for Consultation: A on CRF vs progressive CKD  HPI:  Maria Parrish is a 74 y.o. female with past medical history of type 2 DM, HTN, hypercalcemia, dementia and poor mobility who was living at home- last crt in the system was 1.5 in December of 2020.  She was brought to the ER on 10/27 following a fall at home.  Initial labs remarkable for crt of 2.6, hgb of 9.6, potassium low.  CXR was c/w pulmonary edema.  She was admitted and started on diuretics- not a significant amount of UOP and weight appears to be stable.  Never had overt low BP but BP was corrected from very high to more normal.  crt rising slowly through the course of hosp and that is the reason for consult.   Was 2.8 for a few days, is 3 today.  U/A greater than 3 grams of protein-  Prot to crt ratio 2.9- no cells.  U/S shows 9.3-10.2 cm kidneys with preserved echotexture no hydro-  She is conversive, doesn't know year but did know halloween and president.  Thinks her PCP had mentioned something about her kidneys.... [facility] in care everywhere but no data - she is still volume overloaded- UOP not well recorded - weights variable  Creat  Date/Time Value Ref Range Status  07/16/2019 11:11 AM 1.51 (H) 0.60 - 0.93 mg/dL Final    Comment:    For patients >33 years of age, the reference limit for Creatinine is approximately 13% higher for people identified as African-American. Marland Kitchen   02/14/2019 10:56 AM 1.40 (H) 0.60 - 0.93 mg/dL Final    Comment:    For patients >55 years of age, the reference limit for Creatinine is approximately 13% higher for people identified as African-American. .   11/06/2018 11:51 AM 1.13 (H) 0.60 - 0.93 mg/dL Final    Comment:    For patients >41 years of age, the reference limit for Creatinine is approximately 13% higher for people identified as African-American. Marland Kitchen   08/19/2018 02:12 PM 1.21 (H) 0.60 - 0.93 mg/dL  Final    Comment:    For patients >41 years of age, the reference limit for Creatinine is approximately 13% higher for people identified as African-American. .   04/26/2018 04:21 PM 1.36 (H) 0.60 - 0.93 mg/dL Final    Comment:    For patients >77 years of age, the reference limit for Creatinine is approximately 13% higher for people identified as African-American. Marland Kitchen   12/06/2017 11:01 AM 1.01 (H) 0.60 - 0.93 mg/dL Final    Comment:    For patients >65 years of age, the reference limit for Creatinine is approximately 13% higher for people identified as African-American. .   05/21/2017 04:09 PM 1.08 (H) 0.60 - 0.93 mg/dL Final    Comment:    For patients >80 years of age, the reference limit for Creatinine is approximately 13% higher for people identified as African-American. .   01/29/2017 10:54 AM 0.78 0.60 - 0.93 mg/dL Final    Comment:      For patients > or = 73 years of age: The upper reference limit for Creatinine is approximately 13% higher for people identified as African-American.     07/11/2016 03:36 PM 0.94 0.50 - 0.99 mg/dL Final    Comment:      For patients > or = 73 years of age: The upper reference limit for Creatinine is approximately 13% higher  for people identified as African-American.     12/02/2015 09:46 AM 0.57 0.50 - 0.99 mg/dL Final  08/10/2015 10:38 AM 0.67 0.50 - 0.99 mg/dL Final   Creatinine, Ser  Date/Time Value Ref Range Status  05/18/2020 06:09 AM 3.05 (H) 0.44 - 1.00 mg/dL Final  05/17/2020 04:24 AM 2.83 (H) 0.44 - 1.00 mg/dL Final  05/16/2020 05:46 AM 2.88 (H) 0.44 - 1.00 mg/dL Final  05/15/2020 05:50 AM 2.84 (H) 0.44 - 1.00 mg/dL Final  05/14/2020 05:00 AM 2.80 (H) 0.44 - 1.00 mg/dL Final  05/13/2020 11:37 PM 2.89 (H) 0.44 - 1.00 mg/dL Final    Comment:    DELTA CHECK NOTED  05/13/2020 04:08 PM 8.64 (H) 0.44 - 1.00 mg/dL Final    Comment:    DELTA CHECK NOTED  05/12/2020 04:52 PM 2.62 (H) 0.44 - 1.00 mg/dL Final  05/01/2017  11:34 AM 1.11 (H) 0.44 - 1.00 mg/dL Final  09/23/2015 05:37 AM 0.60 0.44 - 1.00 mg/dL Final  09/22/2015 01:17 PM 0.85 0.44 - 1.00 mg/dL Final  09/19/2015 06:21 AM 0.41 (L) 0.44 - 1.00 mg/dL Final  09/14/2015 07:48 PM 0.64 0.44 - 1.00 mg/dL Final  03/27/2015 06:33 AM 0.54 0.44 - 1.00 mg/dL Final  03/25/2015 08:40 PM 0.82 0.44 - 1.00 mg/dL Final     PMHx:   Past Medical History:  Diagnosis Date  . Assistance needed for mobility    walks with walker  . Confusion   . Dementia ([place])   . Diabetes mellitus without complication ([place])   . Generalized weakness   . Hypercalcemia   . Hyperlipidemia   . Hyperparathyroidism ([place])   . Hypertension   . Hypertension   . Moderate protein-calorie malnutrition ([place])   . Peripheral neuropathy   . UTI (lower urinary tract infection)     Past Surgical History:  Procedure Laterality Date  . COLONOSCOPY N/A 08/12/2015   Procedure: COLONOSCOPY;  Surgeon: Daneil Dolin, MD;  Location: [facility];  Service: Endoscopy;  Laterality: N/A;  1245pm  . ESOPHAGOGASTRODUODENOSCOPY N/A 08/12/2015   Procedure: ESOPHAGOGASTRODUODENOSCOPY (EGD);  Surgeon: Daneil Dolin, MD;  Location: [facility];  Service: Endoscopy;  Laterality: N/A;  . none      Family Hx:  Family History  Problem Relation Age of Onset  . Hypertension Brother   . Hypertension Brother   . Diabetes Brother   . Diabetes Father   . Heart attack Mother   . Colon cancer Neg Hx     Social History:  reports that she has never smoked. She has never used smokeless tobacco. She reports that she does not drink alcohol and does not use drugs.  Allergies:  Allergies  Allergen Reactions  . Penicillins Other (See Comments)    Makes patient sore.  Has patient had a PCN reaction causing immediate rash, facial/tongue/throat swelling, SOB or lightheadedness with hypotension: No Has patient had a PCN reaction causing severe rash involving mucus membranes or skin necrosis: No Has patient had a  PCN reaction that required hospitalization No Has patient had a PCN reaction occurring within the last 10 years: No If all of the above answers are "NO", then may proceed with Cephalosporin use.   . Statins Other (See Comments)    Makes wrists and fingers ache    Medications: Prior to Admission medications   Medication Sig Start Date End Date Taking? Authorizing Provider  amLODipine (NORVASC) 5 MG tablet Take by mouth.   Yes [provider]  aspirin EC 81 MG  tablet Take 81 mg by mouth daily.   Yes [provider]  atorvastatin (LIPITOR) 40 MG tablet Take 40 mg by mouth daily.   Yes [provider]  ezetimibe (ZETIA) 10 MG tablet daily. 01/28/18  Yes [provider]  FEROSUL 325 (65 Fe) MG tablet Take 325 mg by mouth 2 (two) times daily. 02/08/20  Yes [provider]  gabapentin (NEURONTIN) 300 MG capsule Take 300 mg by mouth 2 (two) times daily. 08/19/18  Yes [provider]  insulin degludec (TRESIBA FLEXTOUCH) 100 UNIT/ML SOPN FlexTouch Pen Inject 0.22 mLs (22 Units total) into the skin daily. 07/14/19  Yes Nida, Marella Chimes, MD  metoprolol tartrate (LOPRESSOR) 25 MG tablet Take 25 mg by mouth 2 (two) times daily. 05/07/20  Yes [provider]  olmesartan-hydrochlorothiazide (BENICAR HCT) 40-25 MG tablet daily. 05/03/18  Yes [provider]  polyethylene glycol (MIRALAX / GLYCOLAX) packet Take 17 g by mouth daily as needed for mild constipation. 09/19/15  Yes Nita Sells, MD  potassium chloride (KLOR-CON) 10 MEQ tablet Take by mouth.   Yes [provider]  spironolactone (ALDACTONE) 25 MG tablet Take by mouth. 01/08/20  Yes [provider]  Vitamin D, Ergocalciferol, (DRISDOL) 50000 units CAPS capsule Take 1 capsule (50,000 Units total) by mouth every 14 (fourteen) days. 02/26/17  Yes Nida, Marella Chimes, MD  Blood Glucose Monitoring Suppl (ACCU-CHEK GUIDE ME) w/Device KIT 1 Piece by Does not  apply route as directed. 09/02/18   Cassandria Anger, MD  Cyanocobalamin (VITAMIN B-12 PO) Take 1 tablet by mouth daily.    [provider]  folic acid (FOLVITE) 1 MG tablet Take 1 tablet (1 mg total) by mouth daily. Patient not taking: Reported on 05/12/2020 09/19/15   Nita Sells, MD  glucose blood (ACCU-CHEK GUIDE) test strip Use as instructed 4 x daily. E11.65 09/03/18   Cassandria Anger, MD  Insulin Pen Needle (B-D ULTRAFINE III SHORT PEN) 31G X 8 MM MISC 1 each by Does not apply route as directed. 09/02/18   Cassandria Anger, MD  metFORMIN (GLUCOPHAGE) 500 MG tablet Take 1 tablet (500 mg total) by mouth 2 (two) times daily with a meal. Patient not taking: Reported on 05/12/2020 09/02/18   Cassandria Anger, MD    I have reviewed the patient's current medications.  Labs:  Results for orders placed or performed during the hospital encounter of 05/12/20 (from the past 48 hour(s))  Glucose, capillary     Status: Abnormal   Collection Time: 05/16/20 11:17 AM  Result Value Ref Range   Glucose-Capillary 153 (H) 70 - 99 mg/dL    Comment: Glucose reference range applies only to samples taken after fasting for at least 8 hours.  Glucose, capillary     Status: None   Collection Time: 05/16/20  4:24 PM  Result Value Ref Range   Glucose-Capillary 90 70 - 99 mg/dL    Comment: Glucose reference range applies only to samples taken after fasting for at least 8 hours.  Glucose, capillary     Status: Abnormal   Collection Time: 05/16/20  8:54 PM  Result Value Ref Range   Glucose-Capillary 155 (H) 70 - 99 mg/dL    Comment: Glucose reference range applies only to samples taken after fasting for at least 8 hours.   Comment 1 Notify RN    Comment 2 Document in Chart   Basic metabolic panel     Status: Abnormal   Collection Time: 05/17/20  4:24  AM  Result Value Ref Range   Sodium 137 135 - 145 mmol/L   Potassium 3.8 3.5 - 5.1 mmol/L   Chloride 107 98 - 111 mmol/L    CO2 22 22 - 32 mmol/L   Glucose, Bld 73 70 - 99 mg/dL    Comment: Glucose reference range applies only to samples taken after fasting for at least 8 hours.   BUN 45 (H) 8 - 23 mg/dL   Creatinine, Ser 2.83 (H) 0.44 - 1.00 mg/dL   Calcium 9.3 8.9 - 10.3 mg/dL   GFR, Estimated 17 (L) >60 mL/min    Comment: (NOTE) Calculated using the CKD-EPI Creatinine Equation (2021)    Anion gap 8 5 - 15    Comment: Performed at [facility], [address], [place], [place] [address]  Glucose, capillary     Status: None   Collection Time: 05/17/20  7:34 AM  Result Value Ref Range   Glucose-Capillary 80 70 - 99 mg/dL    Comment: Glucose reference range applies only to samples taken after fasting for at least 8 hours.   Comment 1 Notify RN   Glucose, capillary     Status: Abnormal   Collection Time: 05/17/20 11:14 AM  Result Value Ref Range   Glucose-Capillary 127 (H) 70 - 99 mg/dL    Comment: Glucose reference range applies only to samples taken after fasting for at least 8 hours.   Comment 1 Document in Chart   Protein / creatinine ratio, urine     Status: Abnormal   Collection Time: 05/17/20 12:28 PM  Result Value Ref Range   Creatinine, Urine 42.65 mg/dL   Total Protein, Urine 125 mg/dL    Comment: NO NORMAL RANGE ESTABLISHED FOR THIS TEST   Protein Creatinine Ratio 2.93 (H) 0.00 - 0.15 mg/mg[Cre]    Comment: Performed at [facility], [address] [place], [place] [address]  Glucose, capillary     Status: None   Collection Time: 05/17/20  4:17 PM  Result Value Ref Range   Glucose-Capillary 79 70 - 99 mg/dL    Comment: Glucose reference range applies only to samples taken after fasting for at least 8 hours.  Glucose, capillary     Status: Abnormal   Collection Time: 05/17/20  4:51 PM  Result Value Ref Range   Glucose-Capillary 112 (H) 70 - 99 mg/dL    Comment: Glucose reference range applies only to samples taken after fasting for at least 8 hours.   Comment 1 Notify RN   Glucose,  capillary     Status: Abnormal   Collection Time: 05/17/20  8:58 PM  Result Value Ref Range   Glucose-Capillary 120 (H) 70 - 99 mg/dL    Comment: Glucose reference range applies only to samples taken after fasting for at least 8 hours.   Comment 1 Notify RN    Comment 2 Document in Chart   Basic metabolic panel     Status: Abnormal   Collection Time: 05/18/20  6:09 AM  Result Value Ref Range   Sodium 138 135 - 145 mmol/L   Potassium 3.8 3.5 - 5.1 mmol/L   Chloride 106 98 - 111 mmol/L   CO2 23 22 - 32 mmol/L   Glucose, Bld 89 70 - 99 mg/dL    Comment: Glucose reference range applies only to samples taken after fasting for at least 8 hours.   BUN 49 (H) 8 - 23 mg/dL   Creatinine, Ser 3.05 (H) 0.44 - 1.00 mg/dL  Calcium 9.2 8.9 - 10.3 mg/dL   GFR, Estimated 16 (L) >60 mL/min    Comment: (NOTE) Calculated using the CKD-EPI Creatinine Equation (2021)    Anion gap 9 5 - 15    Comment: Performed at [facility], [address], [place], [place] [address]  Iron and TIBC     Status: Abnormal   Collection Time: 05/18/20  6:09 AM  Result Value Ref Range   Iron 31 28 - 170 ug/dL   TIBC 235 (L) 250 - 450 ug/dL   Saturation Ratios 13 10.4 - 31.8 %   UIBC 204 ug/dL    Comment: Performed at [facility], [address], [place], [place] [address]  Ferritin     Status: None   Collection Time: 05/18/20  6:09 AM  Result Value Ref Range   Ferritin 83 11 - 307 ng/mL    Comment: Performed at [facility], [address], [place], [place] [address]  Glucose, capillary     Status: None   Collection Time: 05/18/20  7:46 AM  Result Value Ref Range   Glucose-Capillary 95 70 - 99 mg/dL    Comment: Glucose reference range applies only to samples taken after fasting for at least 8 hours.     ROS:  A comprehensive review of systems was negative.  Physical Exam: Vitals:   05/17/20 2206 05/18/20 0445  BP: (!) 153/72 (!) 140/56  Pulse: 64 66  Resp: 18 16  Temp: 97.8 F (36.6 C) 98.6 F (37 C)   SpO2: 99% 100%     General: alert, possibly some confused HEENT: PERRLA, EOMI Neck: some JVD Heart: RRR Lungs: dec bs at bases Abdomen: soft, non tender Extremities: pitting edema  Skin: warm and dry Neuro: alert, some mild confusion  Assessment/Plan: 74 year old BF with DM, HTN - possibly poorly controlled- with crt of 1.5 11 mos ago-  Now presenting after a fall but found to be volume overloaded with crt in the mid to high 2's 1.Renal- I suspect a lot of this is chronic given longstanding HTN and DM and 3 grams of proteinuria as well as volume overload and anemia likely as a result of her CKD.  No uremic sxms and no indication for dialysis.  Will need nephrology follow up as OP  2. Hypertension/volume  - volume overloaded - I agree with iv lasix- is fine increase to BID as is still overloaded.  Need to watch to make sure BP does not get too low, hasnt happened yet 3. Anemia  - will check iron stores and start ESA 4. Hypokalemia- will replete potassium    Louis Meckel 05/18/2020, 9:32 AM

## 2020-05-18 NOTE — Progress Notes (Signed)
Physical Therapy Treatment Patient Details Name: Maria Parrish MRN: [id number] DOB: 08-19-1945 Today's Date: 05/18/2020    History of Present Illness Maria Parrish is a 74 y.o. female with medical history significant of difficulty ambulating, history of generalized weakness, confusion, history of dementia, type 2 diabetes, history of hypercalcemia, hyperparathyroidism, hypertension, hyperlipidemia, peripheral neuropathy, history of UTI, history of moderate protein calorie malnutrition who is coming to the emergency department after having a fall at home.  She is disoriented to time, date, situation and place.    PT Comments    Pt up in chair upon arrival, agreeable to exercise and gait training. Pt tolerates therapeutic exercise, requiring cues for motor control and attention to task due to forgetting exercise with conversation. Pt uses rocking momentum to power up from sitting, cues to return hands before return to sit to assist with controlled descent. Initially pt with posterior weight and appears anxious to ambulate with SPC, so gave RW and able to take steps. Pt takes small, short, rigid steps, with uneven step lengths that change randomly, cued for increasing bil step length and equal step length. Pt requires min A with ambulation due to unsteadiness and to maneuver RW safely around obstacles and with turns. Pt tolerates remaining up in chair with all needs in reach. Pt will benefit from continued physical therapy in hospital and recommendations below to increase strength, balance, endurance for safe ADLs and gait.    Follow Up Recommendations  SNF;Supervision for mobility/OOB;Supervision/Assistance - 24 hour     Equipment Recommendations  None recommended by PT    Recommendations for Other Services       Precautions / Restrictions Precautions Precautions: Fall Restrictions Weight Bearing Restrictions: No    Mobility  Bed Mobility  General bed mobility comments: in chair upon  arrival  Transfers Overall transfer level: Needs assistance Equipment used: Rolling walker (2 wheeled) Transfers: Sit to/from Stand Sit to Stand: Min assist    General transfer comment: rocking momentum to rise, cues for hand placement on seated surface to rise, min A for controlled lowering with cues to reach back to assist with controlled descent  Ambulation/Gait Ambulation/Gait assistance: Min assist Gait Distance (Feet): 40 Feet Assistive device: Rolling walker (2 wheeled) Gait Pattern/deviations: Step-to pattern;Decreased stride length;Shuffle Gait velocity: decreased   General Gait Details: very short, rigid, shuffling steps, uneven step length, cues for increasing step length, min A to maneuver RW safely around obstacles and with turns, fatigues with ambulation limiting distance   Stairs             Wheelchair Mobility    Modified Rankin (Stroke Patients Only)       Balance Overall balance assessment: Needs assistance  Standing balance support: During functional activity;Bilateral upper extremity supported Standing balance-Leahy Scale: Poor Standing balance comment: reliant on UE support           Cognition Arousal/Alertness: Awake/alert Behavior During Therapy: WFL for tasks assessed/performed Overall Cognitive Status: Within Functional Limits for tasks assessed           Exercises General Exercises - Lower Extremity Ankle Circles/Pumps: AROM;Strengthening;Seated;Both;20 reps Quad Sets: AROM;Strengthening;Seated;Both;10 reps Long Arc Quad: AROM;Strengthening;Seated;Both;15 reps Heel Slides: AROM;Strengthening;Seated;Both;10 reps Hip Flexion/Marching: AROM;Strengthening;Seated;Both;15 reps    General Comments        Pertinent Vitals/Pain Pain Assessment: No/denies pain    Home Living                      Prior Function  PT Goals (current goals can now be found in the care plan section) Acute Rehab PT Goals Patient  Stated Goal: To get stronger and go home Time For Goal Achievement: 05/27/20 Potential to Achieve Goals: Good Progress towards PT goals: Progressing toward goals    Frequency    Min 3X/week      PT Plan Current plan remains appropriate    Co-evaluation              AM-PAC PT "6 Clicks" Mobility   Outcome Measure  Help needed turning from your back to your side while in a flat bed without using bedrails?: None Help needed moving from lying on your back to sitting on the side of a flat bed without using bedrails?: A Little Help needed moving to and from a bed to a chair (including a wheelchair)?: A Little Help needed standing up from a chair using your arms (e.g., wheelchair or bedside chair)?: A Little Help needed to walk in hospital room?: A Little Help needed climbing 3-5 steps with a railing? : A Lot 6 Click Score: 18    End of Session   Activity Tolerance: Patient tolerated treatment well;Patient limited by fatigue Patient left: in chair;with call bell/phone within reach;with chair alarm set Nurse Communication: Mobility status PT Visit Diagnosis: Unsteadiness on feet (R26.81);Other abnormalities of gait and mobility (R26.89);Muscle weakness (generalized) (M62.81)     Time: 4585-9292 PT Time Calculation (min) (ACUTE ONLY): 28 min  Charges:  $Gait Training: 8-22 mins $Therapeutic Exercise: 8-22 mins                      Tori Jansen Sciuto PT, DPT 05/18/20, 12:24 PM [phone]

## 2020-05-18 NOTE — Progress Notes (Addendum)
Progress Note  Patient Name: Maria Parrish Date of Encounter: 05/18/2020  Primary Cardiologist: Gilmer Mor, MD   Subjective   Initially says her breathing was not an issue prior to admission but later admits her son would comment on how short of breath she was with walking around the home. No chest pain or palpitations.   Inpatient Medications    Scheduled Meds: . amLODipine  5 mg Oral Daily  . aspirin EC  81 mg Oral Daily  . atorvastatin  40 mg Oral Daily  . enoxaparin (LOVENOX) injection  30 mg Subcutaneous Q24H  . ezetimibe  10 mg Oral Daily  . ferrous sulfate  325 mg Oral BID  . furosemide  40 mg Intravenous Daily  . gabapentin  300 mg Oral BID  . hydrALAZINE  25 mg Oral Q6H  . insulin aspart  0-9 Units Subcutaneous TID WC  . insulin glargine  22 Units Subcutaneous Daily  . isosorbide mononitrate  30 mg Oral Daily  . metoprolol succinate  25 mg Oral Daily   Continuous Infusions:  PRN Meds: acetaminophen **OR** acetaminophen, ondansetron **OR** ondansetron (ZOFRAN) IV, polyethylene glycol   Vital Signs    Vitals:   05/17/20 1600 05/17/20 2206 05/18/20 0445 05/18/20 0600  BP: (!) 142/76 (!) 153/72 (!) 140/56   Pulse: 60 64 66   Resp: 20 18 16    Temp: 98.4 F (36.9 C) 97.8 F (36.6 C) 98.6 F (37 C)   TempSrc:  Oral    SpO2: 100% 99% 100%   Weight:    71.4 kg  Height:        Intake/Output Summary (Last 24 hours) at 05/18/2020 0928 Last data filed at 05/18/2020 0438 Gross per 24 hour  Intake 840 ml  Output 900 ml  Net -60 ml    Last 3 Weights 05/18/2020 05/17/2020 05/16/2020  Weight (lbs) 157 lb 6.4 oz 160 lb 158 lb 6.4 oz  Weight (kg) 71.396 kg 72.576 kg 71.85 kg      Telemetry    NSR, HR in 60's to 70's. Occasional PVC's, sometimes occurring as a couplet.   - Personally Reviewed  ECG    No new tracings.   Physical Exam   General: Well developed, elderly female appearing in no acute distress. Head: Normocephalic,  atraumatic.  Neck: Supple without bruits, JVD. Lungs:  Resp regular and unlabored, mild rales bilaterally . Heart: RRR, S1, S2, no S3, S4, 2/6 systolic murmur along Apex.  Abdomen: Soft, non-tender, non-distended with normoactive bowel sounds. No hepatomegaly. No rebound/guarding. No obvious abdominal masses. Extremities: No clubbing or cyanosis, 1+ pitting edema bilaterally. Distal pedal pulses are 2+ bilaterally. Neuro: Alert and oriented X 2 (person, place). Moves all extremities spontaneously. Psych: Normal affect.  Labs    Chemistry Recent Labs  Lab 05/13/20 0028 05/13/20 1608 05/16/20 0546 05/17/20 0424 05/18/20 0609  NA  --    < > 137 137 138  K  --    < > 3.8 3.8 3.8  CL  --    < > 108 107 106  CO2  --    < > 19* 22 23  GLUCOSE  --    < > 94 73 89  BUN  --    < > 44* 45* 49*  CREATININE  --    < > 2.88* 2.83* 3.05*  CALCIUM  --    < > 8.7* 9.3 9.2  PROT 7.4  --   --   --   --  ALBUMIN 3.2*  --   --   --   --   AST 34  --   --   --   --   ALT 50*  --   --   --   --   ALKPHOS 71  --   --   --   --   BILITOT 0.9  --   --   --   --   GFRNONAA  --    < > 17* 17* 16*  ANIONGAP  --    < > 10 8 9    < > = values in this interval not displayed.     Hematology Recent Labs  Lab 05/12/20 1652 05/13/20 0028 05/15/20 0550  WBC 8.6 8.1 7.4  RBC 3.37* 3.29* 3.13*  HGB 9.7* 9.5* 9.3*  HCT 29.3* 29.6* 29.0*  MCV 86.9 90.0 92.7  MCH 28.8 28.9 29.7  MCHC 33.1 32.1 32.1  RDW 15.2 15.3 16.1*  PLT 375 371 364    Cardiac EnzymesNo results for input(s): TROPONINI in the last 168 hours. No results for input(s): TROPIPOC in the last 168 hours.   BNP Recent Labs  Lab 05/12/20 1652  BNP 1,352.0*     DDimer No results for input(s): DDIMER in the last 168 hours.   Radiology    US RENAL  Result Date: 05/17/2020 CLINICAL DATA:  74 year old female with stage 4 chronic kidney disease. EXAM: RENAL / URINARY TRACT ULTRASOUND COMPLETE COMPARISON:  Renal ultrasound 07/21/2015.  FINDINGS: Right Kidney: Renal measurements: 10.2 x 4.8 x 5.4 cm = volume: 138 mL. Preserved cortical echogenicity and cortical thickness (image 4). No right hydronephrosis or renal lesion. Left Kidney: Renal measurements: 9.3 x 4.7 x 5.3 cm = volume: 121 mL. Less well visualized than the right kidney but cortical echogenicity and thickness appear preserved on image 24. No left hydronephrosis or renal lesion. Bladder: Appears normal for degree of bladder distention. Both ureteral jets detected with Doppler. No urinary debris is evident. Other: None. IMPRESSION: Negative ultrasound appearance of both kidneys and the urinary bladder. Electronically Signed   By: Genevie Ann M.D.   On: 05/17/2020 13:56    Cardiac Studies   Echocardiogram: 05/13/2020 IMPRESSIONS    1. Left ventricular ejection fraction, by estimation, is 40 to 45%. The  left ventricle has mildly decreased function. The left ventricle  demonstrates global hypokinesis. The left ventricular internal cavity size  was mildly dilated. There is mild left  ventricular hypertrophy. Left ventricular diastolic parameters are  consistent with Grade II diastolic dysfunction (pseudonormalization).  Elevated left atrial pressure.  2. Right ventricular systolic function is mildly reduced. The right  ventricular size is normal. There is moderately elevated pulmonary artery  systolic pressure. The estimated right ventricular systolic pressure is  42.6 mmHg.  3. The mitral valve is grossly normal. Mild to moderate mitral valve  regurgitation.  4. The aortic valve is tricuspid. Aortic valve regurgitation is not  visualized. Mild aortic valve sclerosis is present, with no evidence of  aortic valve stenosis.  5. The inferior vena cava is dilated in size with >50% respiratory  variability, suggesting right atrial pressure of 8 mmHg.   Patient Profile     74 y.o. female w/ PMH of chronic diastolic CHF, HTN, HLD, Type 2 DM, Stage 3 CKD and dementia  who is currently admitted with an acute CHF exacerbation. Repeat echo shows EF now reduced at 40-45%.   Assessment & Plan    1. Acute on Chronic Combined  Systolic and Diastolic CHF - Presented with worsening dyspnea and lower extremity edema, found to have an acute CHF exacerbation with BNP elevated to 1352. Repeat echo shows her EF is now reduced at 40-45% with Grade 2 DD and global HK. RV function mildly reduced but reviewed by Dr. Harl Bowie and RV function felt to be within normal limits. Plan is for medical management of her cardiomyopathy in the setting of her CKD and dementia.  - She has been receiving IV Lasix 40mg  daily and received two doses on 11/1. I&O's not fully recorded but weight listed as having declined by 3 lbs since yesterday. Would continue with once daily dosing of IV Lasix for now given trend in renal function.  - Lopressor was switched to Toprol-XL 25mg  daily and she has been started on Hydralazine 25mg  Q6H and Imdur 30mg  daily. Will transition Hydrazine to TID dosing for improved compliance and titrate to 50mg  TID. PTA Spironolactone and ARB have been discontinued due to her AKI.  2. HTN - BP has been elevated at 140/56 - 153/90 within the past 24 hours. Will increase Hydralazine but adjust dosing from QID to TID for improved compliance (overall dose change of going from 100mg  daily to 150mg  daily). Continue Amlodipine, Toprol-XL and Imdur.   3. HLD - No recent FLP on file. Remains on Atorvastatin 40mg  daily and Zetia 10mg  daily.   4. Mitral Regurgitation - Mild to moderate by echo this admission.  5. Acute on Chronic Stage 3 CKD - Baseline creatinine 1.2 - 1.4 in 2020. At 2.62 this admission (erroneous creatinine of 8.64 reported the next day but repeat at 2.89). Continuing to trend upwards to 3.05 today. Nephrology consult has been requested.    For questions or updates, please contact [place] Please consult [email or web address] for contact info under  Cardiology/STEMI.   Arna Medici , PA-C 9:28 AM 05/18/2020 Pager: [phone]  Patient seen and discussed with PA Ahmed Prima, I agree with her documentation. Admitted with acute on chronic HF. Echo shows LVEF 40-45%, grade II diastolic dysfunction. I/Os are incomplete, from documentation only 913mL of uop after receiving IV lasix 40mg  x 2  Yesterday. Further uptrend in Cr with attempted diuresis, baselien 1.5 she is up to 3 today. She has mild LV systolic dysfunction, grade II diastolic dysfunction, and normal RV function by my read and thus cardiac function alone would not explain her worsning renal function with attempts at diuresis, recommend nephrology evaluation. From cardiology note in care everywhere Cr 1.65 on 10/2019, admission Cr 2.8 with uptrend with attempts at diuresis.  Continue IV lasix today convervative dosing 40mg  daily.   Carlyle Dolly MD

## 2020-05-18 NOTE — Progress Notes (Signed)
PROGRESS NOTE    Maria Parrish  WCH:[id number] DOB: 1946/03/31 DOA: 05/12/2020 PCP: Vidal Schwalbe, MD   Brief Narrative: Maria Parrish is a 74 y.o.  female with medical history significant of difficulty ambulating, history of generalized weakness, confusion, history of dementia, type 2 diabetes, history of hypercalcemia, hyperparathyroidism, hypertension, hyperlipidemia, peripheral neuropathy. Patient presented secondary to a fall and found to have concern for a heart failure exacerbation. Given lasix. Transthoracic Echocardiogram with reduced EF.   Assessment & Plan:   Principal Problem:   Acute on chronic diastolic congestive heart failure (HCC) Active Problems:   Uncontrolled type 2 diabetes mellitus (HCC)   Hypokalemia   Essential hypertension, benign   Mixed hyperlipidemia   Dementia (HCC)   Left hemiparesis (HCC)   Elevated serum creatinine   Acute on chronic diastolic (congestive) heart failure (HCC)   Acute on chronic diastolic heart failure Acute systolic heart failure Last Transthoracic Echocardiogram from 2017. Significantly elevated BNP. Given Lasix 20 mg IV x1 initially. Currently on room air with no current concerns about dyspnea. Transthoracic Echocardiogram significant for reduced EF of 40-45% and grade 2 diastolic dysfunction. Weight is up 1.1 kg from admission and down 1.2 kg from 11/1. Inaccurate in/out record -Cardiology recommendations: Decreased back to Lasix 40 mg IV daily; no ischemic workup -Daily weights (standing) and strict in/out  AKI on CKD stage IIIb Assumed AKI. Last creatinine from 10 months ago was around 1.5. Creatinine of 2.62 on admission presumably in setting of fluid overload. Repeat BMP probably erroneously indicated a creatinine bump up to 8.64 but subsequent creatinine was 2.89. Creatinine trended down slightly but stabilized. Patient appears hypervolemic. Per son, patient's PCP was referring patient to nephrologist. Called PCP to verify  creatinine but EHR was down. -Diuresis as above -Nephrologist recommendations: likely CKD secondary to hypertension.   Diabetes mellitus, type 2 Patient is on Tresiba 22 units daily as an outpatient. Metformin listed but apparently no longer taking.  Patient was not given Lantus on 10/28 for unknown reason -Continue Lantus 22 units daily and SSI  Essential hypertension Uncontrolled -Continue amlodipine, Imdur, hydralazine (increased dose and decreased frequency) and metoprolol (switched to Toprol XL)  Hyperlipidemia -Continue Lipitor and Zetia  Anemia Unknown chronicity. Stable while inpatient. Iron panel unremarkable. -CBC in AM   Neuropathy -Continue gabapentin  Hypokalemia In setting of diuresis. Magnesium repleted. Resolved with repletion.  Hyperparathyroidism Noted on chart. Calcium normal.  Dementia Not fully oriented but appears to be stable.   DVT prophylaxis: Lovenox Code Status:   Code Status: Full Code Family Communication: None at bedside. Disposition Plan: Discharge to home in 1-2 days pending continued IV diuresis per cardiology recommendations   Consultants:   Cardiology  Nephrology  Procedures:   TRANSTHORACIC ECHOCARDIOGRAM (10/28) IMPRESSIONS    1. Left ventricular ejection fraction, by estimation, is 40 to 45%. The  left ventricle has mildly decreased function. The left ventricle  demonstrates global hypokinesis. The left ventricular internal cavity size  was mildly dilated. There is mild left  ventricular hypertrophy. Left ventricular diastolic parameters are  consistent with Grade II diastolic dysfunction (pseudonormalization).  Elevated left atrial pressure.  2. Right ventricular systolic function is mildly reduced. The right  ventricular size is normal. There is moderately elevated pulmonary artery  systolic pressure. The estimated right ventricular systolic pressure is  35.3 mmHg.  3. The mitral valve is grossly normal. Mild to  moderate mitral valve  regurgitation.  4. The aortic valve is tricuspid. Aortic valve regurgitation is  not  visualized. Mild aortic valve sclerosis is present, with no evidence of  aortic valve stenosis.  5. The inferior vena cava is dilated in size with >50% respiratory  variability, suggesting right atrial pressure of 8 mmHg.   Antimicrobials:  None    Subjective: No issues.  Objective: Vitals:   05/17/20 1600 05/17/20 2206 05/18/20 0445 05/18/20 0600  BP: (!) 142/76 (!) 153/72 (!) 140/56   Pulse: 60 64 66   Resp: 20 18 16    Temp: 98.4 F (36.9 C) 97.8 F (36.6 C) 98.6 F (37 C)   TempSrc:  Oral    SpO2: 100% 99% 100%   Weight:    71.4 kg  Height:        Intake/Output Summary (Last 24 hours) at 05/18/2020 1111 Last data filed at 05/18/2020 0438 Gross per 24 hour  Intake 480 ml  Output 600 ml  Net -120 ml   Filed Weights   05/16/20 1459 05/17/20 0515 05/18/20 0600  Weight: 71.8 kg 72.6 kg 71.4 kg    Examination:  General exam: Appears calm and comfortable Respiratory system: Clear to auscultation. Respiratory effort normal. Cardiovascular system: S1 & S2 heard, RRR. No murmurs, rubs, gallops or clicks. Gastrointestinal system: Abdomen is nondistended, soft and nontender. No organomegaly or masses felt. Normal bowel sounds heard. Central nervous system: Alert and oriented. No focal neurological deficits. Musculoskeletal: 1+ BLE edema. No calf tenderness Skin: No cyanosis. No rashes Psychiatry: Judgement and insight appear normal. Mood & affect appropriate.    Data Reviewed: I have personally reviewed following labs and imaging studies  CBC Lab Results  Component Value Date   WBC 7.4 05/15/2020   RBC 3.13 (L) 05/15/2020   HGB 9.3 (L) 05/15/2020   HCT 29.0 (L) 05/15/2020   MCV 92.7 05/15/2020   MCH 29.7 05/15/2020   PLT 364 05/15/2020   MCHC 32.1 05/15/2020   RDW 16.1 (H) 05/15/2020   LYMPHSABS 2.0 05/01/2017   MONOABS 0.5 05/01/2017   EOSABS 0.3  05/01/2017   BASOSABS 0.0 23/55/7322     Last metabolic panel Lab Results  Component Value Date   NA 138 05/18/2020   K 3.8 05/18/2020   CL 106 05/18/2020   CO2 23 05/18/2020   BUN 49 (H) 05/18/2020   CREATININE 3.05 (H) 05/18/2020   GLUCOSE 89 05/18/2020   GFRNONAA 16 (L) 05/18/2020   GFRAA 40 (L) 07/16/2019   CALCIUM 9.2 05/18/2020   PHOS 3.2 05/21/2017   PROT 7.4 05/13/2020   ALBUMIN 3.2 (L) 05/13/2020   BILITOT 0.9 05/13/2020   ALKPHOS 71 05/13/2020   AST 34 05/13/2020   ALT 50 (H) 05/13/2020   ANIONGAP 9 05/18/2020    CBG (last 3)  Recent Labs    05/17/20 2058 05/18/20 0746 05/18/20 1106  GLUCAP 120* 95 159*     GFR: Estimated Creatinine Clearance: 15.9 mL/min (A) (by C-G formula based on SCr of 3.05 mg/dL (H)).  Coagulation Profile: No results for input(s): INR, PROTIME in the last 168 hours.  Recent Results (from the past 240 hour(s))  Respiratory Panel by RT PCR (Flu A&B, Covid) - Nasopharyngeal Swab     Status: None   Collection Time: 05/12/20  7:33 PM   Specimen: Nasopharyngeal Swab  Result Value Ref Range Status   SARS Coronavirus 2 by RT PCR NEGATIVE NEGATIVE Final    Comment: (NOTE) SARS-CoV-2 target nucleic acids are NOT DETECTED.  The SARS-CoV-2 RNA is generally detectable in upper respiratoy specimens during the acute phase  of infection. The lowest concentration of SARS-CoV-2 viral copies this assay can detect is 131 copies/mL. A negative result does not preclude SARS-Cov-2 infection and should not be used as the sole basis for treatment or other patient management decisions. A negative result may occur with  improper specimen collection/handling, submission of specimen other than nasopharyngeal swab, presence of viral mutation(s) within the areas targeted by this assay, and inadequate number of viral copies (<131 copies/mL). A negative result must be combined with clinical observations, patient history, and epidemiological information.  The expected result is Negative.  Fact Sheet for Patients:  PinkCheek.be  Fact Sheet for Healthcare Providers:  GravelBags.it  This test is no t yet approved or cleared by the [place] FDA and  has been authorized for detection and/or diagnosis of SARS-CoV-2 by FDA under an Emergency Use Authorization (EUA). This EUA will remain  in effect (meaning this test can be used) for the duration of the COVID-19 declaration under Section 564(b)(1) of the Act, 21 U.S.C. section 360bbb-3(b)(1), unless the authorization is terminated or revoked sooner.     Influenza A by PCR NEGATIVE NEGATIVE Final   Influenza B by PCR NEGATIVE NEGATIVE Final    Comment: (NOTE) The Xpert Xpress SARS-CoV-2/FLU/RSV assay is intended as an aid in  the diagnosis of influenza from Nasopharyngeal swab specimens and  should not be used as a sole basis for treatment. Nasal washings and  aspirates are unacceptable for Xpert Xpress SARS-CoV-2/FLU/RSV  testing.  Fact Sheet for Patients: PinkCheek.be  Fact Sheet for Healthcare Providers: GravelBags.it  This test is not yet approved or cleared by the [place] FDA and  has been authorized for detection and/or diagnosis of SARS-CoV-2 by  FDA under an Emergency Use Authorization (EUA). This EUA will remain  in effect (meaning this test can be used) for the duration of the  Covid-19 declaration under Section 564(b)(1) of the Act, 21  U.S.C. section 360bbb-3(b)(1), unless the authorization is  terminated or revoked. Performed at [facility], [address], [place], [place] [address]   Urine culture     Status: Abnormal   Collection Time: 05/12/20  7:57 PM   Specimen: Urine, Random  Result Value Ref Range Status   Specimen Description   Final    URINE, RANDOM Performed at [facility], [address] [place], [place] [address]    Special  Requests   Final    NONE Performed at [facility], [address] [place],  [address]    Culture MULTIPLE SPECIES PRESENT, SUGGEST RECOLLECTION (A)  Final   Report Status 05/14/2020 FINAL  Final        Radiology Studies: US RENAL  Result Date: 05/17/2020 CLINICAL DATA:  74 year old female with stage 4 chronic kidney disease. EXAM: RENAL / URINARY TRACT ULTRASOUND COMPLETE COMPARISON:  Renal ultrasound 07/21/2015. FINDINGS: Right Kidney: Renal measurements: 10.2 x 4.8 x 5.4 cm = volume: 138 mL. Preserved cortical echogenicity and cortical thickness (image 4). No right hydronephrosis or renal lesion. Left Kidney: Renal measurements: 9.3 x 4.7 x 5.3 cm = volume: 121 mL. Less well visualized than the right kidney but cortical echogenicity and thickness appear preserved on image 24. No left hydronephrosis or renal lesion. Bladder: Appears normal for degree of bladder distention. Both ureteral jets detected with Doppler. No urinary debris is evident. Other: None. IMPRESSION: Negative ultrasound appearance of both kidneys and the urinary bladder. Electronically Signed   By: Genevie Ann M.D.   On: 05/17/2020 13:56  Scheduled Meds: . amLODipine  5 mg Oral Daily  . aspirin EC  81 mg Oral Daily  . atorvastatin  40 mg Oral Daily  . darbepoetin (ARANESP) injection - NON-DIALYSIS  100 mcg Subcutaneous Q Tue-1800  . enoxaparin (LOVENOX) injection  30 mg Subcutaneous Q24H  . ezetimibe  10 mg Oral Daily  . ferrous sulfate  325 mg Oral BID  . furosemide  40 mg Intravenous Q12H  . gabapentin  300 mg Oral BID  . hydrALAZINE  50 mg Oral Q8H  . insulin aspart  0-9 Units Subcutaneous TID WC  . insulin glargine  22 Units Subcutaneous Daily  . isosorbide mononitrate  30 mg Oral Daily  . metoprolol succinate  25 mg Oral Daily  . potassium chloride  20 mEq Oral BID   Continuous Infusions:   LOS: 5 days     Cordelia Poche, MD [facility] 05/18/2020, 11:11 AM  If 7PM-7AM, please  contact night-coverage [email or web address]

## 2020-05-19 DIAGNOSIS — I5043 Acute on chronic combined systolic (congestive) and diastolic (congestive) heart failure: Secondary | ICD-10-CM

## 2020-05-19 LAB — RENAL FUNCTION PANEL
Albumin: 2.9 g/dL — ABNORMAL LOW (ref 3.5–5.0)
Anion gap: 8 (ref 5–15)
BUN: 47 mg/dL — ABNORMAL HIGH (ref 8–23)
CO2: 24 mmol/L (ref 22–32)
Calcium: 9.4 mg/dL (ref 8.9–10.3)
Chloride: 104 mmol/L (ref 98–111)
Creatinine, Ser: 2.91 mg/dL — ABNORMAL HIGH (ref 0.44–1.00)
GFR, Estimated: 17 mL/min — ABNORMAL LOW (ref >60)
Glucose, Bld: 155 mg/dL — ABNORMAL HIGH (ref 70–99)
Phosphorus: 4.1 mg/dL (ref 2.5–4.6)
Potassium: 4.8 mmol/L (ref 3.5–5.1)
Sodium: 136 mmol/L (ref 135–145)

## 2020-05-19 LAB — CBC
HCT: 26.5 % — ABNORMAL LOW (ref 36.0–46.0)
Hemoglobin: 8.4 g/dL — ABNORMAL LOW (ref 12.0–15.0)
MCH: 28.7 pg (ref 26.0–34.0)
MCHC: 31.7 g/dL (ref 30.0–36.0)
MCV: 90.4 fL (ref 80.0–100.0)
Platelets: 343 10*3/uL (ref 150–400)
RBC: 2.93 MIL/uL — ABNORMAL LOW (ref 3.87–5.11)
RDW: 15.8 % — ABNORMAL HIGH (ref 11.5–15.5)
WBC: 5.9 10*3/uL (ref 4.0–10.5)
nRBC: 0 % (ref 0.0–0.2)

## 2020-05-19 LAB — PROTEIN ELECTROPHORESIS, SERUM
A/G Ratio: 0.8 (ref 0.7–1.7)
Albumin ELP: 2.5 g/dL — ABNORMAL LOW (ref 2.9–4.4)
Alpha-1-Globulin: 0.3 g/dL (ref 0.0–0.4)
Alpha-2-Globulin: 0.8 g/dL (ref 0.4–1.0)
Beta Globulin: 0.9 g/dL (ref 0.7–1.3)
Gamma Globulin: 1.1 g/dL (ref 0.4–1.8)
Globulin, Total: 3 g/dL (ref 2.2–3.9)
Total Protein ELP: 5.5 g/dL — ABNORMAL LOW (ref 6.0–8.5)

## 2020-05-19 LAB — GLUCOSE, CAPILLARY
Glucose-Capillary: 103 mg/dL — ABNORMAL HIGH (ref 70–99)
Glucose-Capillary: 163 mg/dL — ABNORMAL HIGH (ref 70–99)
Glucose-Capillary: 82 mg/dL (ref 70–99)
Glucose-Capillary: 124 mg/dL — ABNORMAL HIGH (ref 70–99)

## 2020-05-19 LAB — IRON AND TIBC
Iron: 45 ug/dL (ref 28–170)
Saturation Ratios: 19 % (ref 10.4–31.8)
TIBC: 242 ug/dL — ABNORMAL LOW (ref 250–450)
UIBC: 197 ug/dL

## 2020-05-19 LAB — FERRITIN: Ferritin: 87 ng/mL (ref 11–307)

## 2020-05-19 MED ORDER — SODIUM CHLORIDE 0.9 % IV SOLN
510.0000 mg | Freq: Once | INTRAVENOUS | Status: AC
  Administered 2020-05-19: 510 mg via INTRAVENOUS
  Filled 2020-05-19: qty 510

## 2020-05-19 NOTE — Progress Notes (Signed)
[facility] Progress Note    Assessment/ Plan:   Assessment/Plan: 74 year old BF with DM, HTN - possibly poorly controlled- with crt of 1.5 11 mos ago-  Now presenting after a fall but found to be volume overloaded with crt in the mid to high 2's  1.Renal- I suspect a lot of this is chronic given longstanding HTN and DM and 3 grams of proteinuria as well as volume overload and anemia likely as a result of her CKD.  No uremic sxms and no indication for dialysis.  On IV lasix 40 BID, hopefully can transition to orals either this evening or tomorrow AM.  Labs are pending. 2. Hypertension/volume- BP adequate, Lasix as above.   3. Anemia  - will check iron stores and start ESA, will give IV iron today 4. Hypokalemia- will replete potassium  Subjective:    Sitting in bed eating breakfast.  1700 mL UOP.  Labs pending this AM.     Objective:   BP (!) 141/69 (BP Location: Right Arm)   Pulse 63   Temp 97.9 F (36.6 C) (Oral)   Resp 18   Ht 5\' 4"  (1.626 m)   Wt 71.4 kg   SpO2 99%   BMI 27.02 kg/m   Intake/Output Summary (Last 24 hours) at 05/19/2020 0955 Last data filed at 05/19/2020 0500 Gross per 24 hour  Intake 240 ml  Output 1700 ml  Net -1460 ml   Weight change:   Physical Exam: Gen: NAD, sitting in bed, eating breakfast, no O2 CVS: RRR Resp: clear bilaterally Abd: soft, nontender NABS Ext: 1+ LE edema  Imaging: US RENAL  Result Date: 05/17/2020 CLINICAL DATA:  74 year old female with stage 4 chronic kidney disease. EXAM: RENAL / URINARY TRACT ULTRASOUND COMPLETE COMPARISON:  Renal ultrasound 07/21/2015. FINDINGS: Right Kidney: Renal measurements: 10.2 x 4.8 x 5.4 cm = volume: 138 mL. Preserved cortical echogenicity and cortical thickness (image 4). No right hydronephrosis or renal lesion. Left Kidney: Renal measurements: 9.3 x 4.7 x 5.3 cm = volume: 121 mL. Less well visualized than the right kidney but cortical echogenicity and thickness appear preserved on  image 24. No left hydronephrosis or renal lesion. Bladder: Appears normal for degree of bladder distention. Both ureteral jets detected with Doppler. No urinary debris is evident. Other: None. IMPRESSION: Negative ultrasound appearance of both kidneys and the urinary bladder. Electronically Signed   By: Genevie Ann M.D.   On: 05/17/2020 13:56    Labs: BMET Recent Labs  Lab 05/13/20 1608 05/13/20 2337 05/14/20 0500 05/15/20 0550 05/16/20 0546 05/17/20 0424 05/18/20 0609  NA 134* 138 139 138 137 137 138  K 3.9 3.1* 3.1* 3.8 3.8 3.8 3.8  CL 95* 109 108 107 108 107 106  CO2 25 19* 20* 19* 19* 22 23  GLUCOSE 100* 268* 218* 135* 94 73 89  BUN 22 46* 46* 45* 44* 45* 49*  CREATININE 8.64* 2.89* 2.80* 2.84* 2.88* 2.83* 3.05*  CALCIUM 9.0 8.6* 8.8* 8.8* 8.7* 9.3 9.2   CBC Recent Labs  Lab 05/12/20 1652 05/13/20 0028 05/15/20 0550 05/19/20 0337  WBC 8.6 8.1 7.4 5.9  HGB 9.7* 9.5* 9.3* 8.4*  HCT 29.3* 29.6* 29.0* 26.5*  MCV 86.9 90.0 92.7 90.4  PLT 375 371 364 343    Medications:    . amLODipine  5 mg Oral Daily  . aspirin EC  81 mg Oral Daily  . atorvastatin  40 mg Oral Daily  . darbepoetin (ARANESP) injection - NON-DIALYSIS  100 mcg Subcutaneous Q Tue-1800  . enoxaparin (LOVENOX) injection  30 mg Subcutaneous Q24H  . ezetimibe  10 mg Oral Daily  . ferrous sulfate  325 mg Oral BID  . furosemide  40 mg Intravenous Q12H  . gabapentin  300 mg Oral BID  . hydrALAZINE  50 mg Oral Q8H  . insulin aspart  0-9 Units Subcutaneous TID WC  . insulin glargine  22 Units Subcutaneous Daily  . isosorbide mononitrate  30 mg Oral Daily  . metoprolol succinate  25 mg Oral Daily  . potassium chloride  20 mEq Oral BID      Madelon Lips MD 05/19/2020, 9:55 AM

## 2020-05-19 NOTE — Progress Notes (Signed)
Progress Note  Patient Name: Maria Parrish Date of Encounter: 05/19/2020  Primary Cardiologist: Carlyle Dolly, MD  Subjective   No shortness of breath at rest today.  Leg swelling improved.  No chest pain.  Inpatient Medications    Scheduled Meds: . amLODipine  5 mg Oral Daily  . aspirin EC  81 mg Oral Daily  . atorvastatin  40 mg Oral Daily  . darbepoetin (ARANESP) injection - NON-DIALYSIS  100 mcg Subcutaneous Q Tue-1800  . enoxaparin (LOVENOX) injection  30 mg Subcutaneous Q24H  . ezetimibe  10 mg Oral Daily  . ferrous sulfate  325 mg Oral BID  . furosemide  40 mg Intravenous Q12H  . gabapentin  300 mg Oral BID  . hydrALAZINE  50 mg Oral Q8H  . insulin aspart  0-9 Units Subcutaneous TID WC  . insulin glargine  22 Units Subcutaneous Daily  . isosorbide mononitrate  30 mg Oral Daily  . metoprolol succinate  25 mg Oral Daily  . potassium chloride  20 mEq Oral BID    PRN Meds: acetaminophen **OR** acetaminophen, ondansetron **OR** ondansetron (ZOFRAN) IV, polyethylene glycol   Vital Signs    Vitals:   05/18/20 1228 05/18/20 1405 05/18/20 2117 05/19/20 0408  BP: (!) 150/67 122/64 (!) 150/62 (!) 141/69  Pulse: 67 66 66 63  Resp: 18 20 18 18   Temp: 97.6 F (36.4 C) 98.5 F (36.9 C) 98.2 F (36.8 C) 97.9 F (36.6 C)  TempSrc: Oral Oral  Oral  SpO2: 100% 100% 99% 99%  Weight:      Height:        Intake/Output Summary (Last 24 hours) at 05/19/2020 0918 Last data filed at 05/19/2020 0500 Gross per 24 hour  Intake 240 ml  Output 1700 ml  Net -1460 ml   Filed Weights   05/16/20 1459 05/17/20 0515 05/18/20 0600  Weight: 71.8 kg 72.6 kg 71.4 kg    Telemetry    Sinus rhythm.  Personally reviewed.  ECG    An ECG dated 05/13/2020 was personally reviewed today and demonstrated:  Sinus rhythm with LVH, PACs and PVC, poor R wave progression.  Physical Exam   GEN: No acute distress.   Neck: No JVD. Cardiac: RRR, 2/6 systolic murmur, rub, or gallop.    Respiratory: Nonlabored. Clear to auscultation bilaterally. GI: Soft, nontender, bowel sounds present. MS: No edema; No deformity.  Labs    Chemistry Recent Labs  Lab 05/13/20 0028 05/13/20 1608 05/16/20 0546 05/17/20 0424 05/18/20 0609  NA  --    < > 137 137 138  K  --    < > 3.8 3.8 3.8  CL  --    < > 108 107 106  CO2  --    < > 19* 22 23  GLUCOSE  --    < > 94 73 89  BUN  --    < > 44* 45* 49*  CREATININE  --    < > 2.88* 2.83* 3.05*  CALCIUM  --    < > 8.7* 9.3 9.2  PROT 7.4  --   --   --   --   ALBUMIN 3.2*  --   --   --   --   AST 34  --   --   --   --   ALT 50*  --   --   --   --   ALKPHOS 71  --   --   --   --  BILITOT 0.9  --   --   --   --   GFRNONAA  --    < > 17* 17* 16*  ANIONGAP  --    < > 10 8 9    < > = values in this interval not displayed.     Hematology Recent Labs  Lab 05/13/20 0028 05/15/20 0550 05/19/20 0337  WBC 8.1 7.4 5.9  RBC 3.29* 3.13* 2.93*  HGB 9.5* 9.3* 8.4*  HCT 29.6* 29.0* 26.5*  MCV 90.0 92.7 90.4  MCH 28.9 29.7 28.7  MCHC 32.1 32.1 31.7  RDW 15.3 16.1* 15.8*  PLT 371 364 343    Cardiac Enzymes Recent Labs  Lab 05/12/20 1652 05/13/20 0028  TROPONINIHS 12 13    BNP Recent Labs  Lab 05/12/20 1652  BNP 1,352.0*     Radiology    US RENAL  Result Date: 05/17/2020 CLINICAL DATA:  74 year old female with stage 4 chronic kidney disease. EXAM: RENAL / URINARY TRACT ULTRASOUND COMPLETE COMPARISON:  Renal ultrasound 07/21/2015. FINDINGS: Right Kidney: Renal measurements: 10.2 x 4.8 x 5.4 cm = volume: 138 mL. Preserved cortical echogenicity and cortical thickness (image 4). No right hydronephrosis or renal lesion. Left Kidney: Renal measurements: 9.3 x 4.7 x 5.3 cm = volume: 121 mL. Less well visualized than the right kidney but cortical echogenicity and thickness appear preserved on image 24. No left hydronephrosis or renal lesion. Bladder: Appears normal for degree of bladder distention. Both ureteral jets detected with  Doppler. No urinary debris is evident. Other: None. IMPRESSION: Negative ultrasound appearance of both kidneys and the urinary bladder. Electronically Signed   By: Genevie Ann M.D.   On: 05/17/2020 13:56    Cardiac Studies   Echocardiogram 05/13/2020: 1. Left ventricular ejection fraction, by estimation, is 40 to 45%. The  left ventricle has mildly decreased function. The left ventricle  demonstrates global hypokinesis. The left ventricular internal cavity size  was mildly dilated. There is mild left  ventricular hypertrophy. Left ventricular diastolic parameters are  consistent with Grade II diastolic dysfunction (pseudonormalization).  Elevated left atrial pressure.  2. Right ventricular systolic function is mildly reduced. The right  ventricular size is normal. There is moderately elevated pulmonary artery  systolic pressure. The estimated right ventricular systolic pressure is  56.2 mmHg.  3. The mitral valve is grossly normal. Mild to moderate mitral valve  regurgitation.  4. The aortic valve is tricuspid. Aortic valve regurgitation is not  visualized. Mild aortic valve sclerosis is present, with no evidence of  aortic valve stenosis.  5. The inferior vena cava is dilated in size with >50% respiratory  variability, suggesting right atrial pressure of 8 mmHg.   Patient Profile     74 y.o. female with PMH of chronic diastolic CHF, HTN, HLD, Type 2 DM, Stage 3 CKD and dementia who is currently admitted with an acute CHF exacerbation. Repeat echo shows EF now reduced at 40-45%.   Assessment & Plan    1.  Acute on chronic combined heart failure with LVEF 40 to 45%, mildly reduced RV contraction (based on review of all of the images and in the setting of moderate pulmonary hypertension, TAPSE not reflecting mid to apical RV contraction).  She has been on IV Lasix 40 mg twice daily with approximately 1500 cc out more than in last 24 hours, recorded weight down 1 kg.   2.  Acute on  chronic renal failure with CKD stage IIIb at baseline.  Creatinine 3.05 yesterday with  follow-up results pending.  She has been seen by nephrology, no change made in diuretics as yet.  3.  Mixed hyperlipidemia, on Lipitor and Zetia.  4.  Essential hypertension, currently on Norvasc, hydralazine, Imdur, and Toprol-XL.  Appreciate input from nephrology, will keep IV Lasix at current dose but needs follow-up BMET today.  She has shown clinical improvement however and I would suspect she could be switched to Lasix 40 mg daily starting tomorrow with potassium supplement.  Otherwise continue combination of hydralazine/nitrates, Norvasc, and Toprol-XL.  Signed, Rozann Lesches, MD  05/19/2020, 9:18 AM

## 2020-05-19 NOTE — Progress Notes (Signed)
PROGRESS NOTE    Maria Parrish  PZW:[id number] DOB: Jun 30, 1946 DOA: 05/12/2020 PCP: Vidal Schwalbe, MD    Chief Complaint  Patient presents with  . Fall    Brief Narrative:  74 year old lady with prior history of dementia, type 2 diabetes, hypercalcemia, hyperparathyroidism, hypertension, hyperlipidemia, peripheral neuropathy presents with a fall and she was found to be eating acute on chronic diastolic heart failure.  Repeat echocardiogram ordered showed left ventricular ejection fraction of 40 to 45% with grade 2 diastolic dysfunction.  Cardiology consulted.   Assessment & Plan:   Principal Problem:   Acute on chronic diastolic congestive heart failure (HCC) Active Problems:   Uncontrolled type 2 diabetes mellitus (HCC)   Hypokalemia   Essential hypertension, benign   Mixed hyperlipidemia   Dementia (HCC)   Left hemiparesis (HCC)   Elevated serum creatinine   Acute on chronic diastolic (congestive) heart failure (HCC)   Acute on chronic diastolic heart failure, acute systolic heart failure Last echocardiogram significant for reduced left ventricular ejection fraction and grade 2 diastolic dysfunction. Start the patient on IV Lasix. Strict intake and output and daily weights. Monitor renal parameters while on Lasix. Appreciate cardiology and nephrology input.   we were able to diurese around 1.5 L since admission.     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  No signs of uremia or indication of dialysis. On diuresis with IV Lasix 40 twice daily     Essential hypertension Blood pressure parameters are optimal.   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 Transfuse to keep hemoglobin greater than 7 .   Mild dementia No behavioral abnormalities at this time.   DVT prophylaxis: (Heparin Code Status: (Full code  Family Communication: None at bedside Disposition:   Status is:  Inpatient  Remains inpatient appropriate because:IV treatments appropriate due to intensity of illness or inability to take PO   Dispo: The patient is from: Home              Anticipated d/c is to: Pending              Anticipated d/c date is: 2 days              Patient currently is not medically stable to d/c.       Consultants:   Cardiology  Procedures: None Antimicrobials: None  Subjective: Reports feeling better no new complaints  Objective: Vitals:   05/18/20 1405 05/18/20 2117 05/19/20 0408 05/19/20 1300  BP: 122/64 (!) 150/62 (!) 141/69 (!) 140/58  Pulse: 66 66 63 68  Resp: 20 18 18 18   Temp: 98.5 F (36.9 C) 98.2 F (36.8 C) 97.9 F (36.6 C) 98.3 F (36.8 C)  TempSrc: Oral  Oral Oral  SpO2: 100% 99% 99% 99%  Weight:      Height:        Intake/Output Summary (Last 24 hours) at 05/19/2020 1720 Last data filed at 05/19/2020 1500 Gross per 24 hour  Intake 834.86 ml  Output 1200 ml  Net -365.14 ml   Filed Weights   05/16/20 1459 05/17/20 0515 05/18/20 0600  Weight: 71.8 kg 72.6 kg 71.4 kg    Examination:  General exam: Appears calm and comfortable  Respiratory system: Clear to auscultation. Respiratory effort normal. Cardiovascular system: S1 & S2 heard, RRR. No JVD,  No pedal edema. Gastrointestinal system: Abdomen is nondistended, soft and nontender.   bowel sounds heard. Central nervous system: Alert  and oriented. No focal neurological deficits. Extremities: Symmetric 5 x 5 power. Skin: No rashes, lesions or ulcers Psychiatry:  Mood & affect appropriate.     Data Reviewed: I have personally reviewed following labs and imaging studies  CBC: Recent Labs  Lab 05/13/20 0028 05/15/20 0550 05/19/20 0337  WBC 8.1 7.4 5.9  HGB 9.5* 9.3* 8.4*  HCT 29.6* 29.0* 26.5*  MCV 90.0 92.7 90.4  PLT 371 364 182    Basic Metabolic Panel: Recent Labs  Lab 05/14/20 0500 05/14/20 0724 05/15/20 0550 05/16/20 0546 05/17/20 0424 05/18/20 0609  05/19/20 0936  NA   < >  --  138 137 137 138 136  K   < >  --  3.8 3.8 3.8 3.8 4.8  CL   < >  --  107 108 107 106 104  CO2   < >  --  19* 19* 22 23 24   GLUCOSE   < >  --  135* 94 73 89 155*  BUN   < >  --  45* 44* 45* 49* 47*  CREATININE   < >  --  2.84* 2.88* 2.83* 3.05* 2.91*  CALCIUM   < >  --  8.8* 8.7* 9.3 9.2 9.4  MG  --  2.2  --   --   --   --   --   PHOS  --   --   --   --   --   --  4.1   < > = values in this interval not displayed.    GFR: Estimated Creatinine Clearance: 16.7 mL/min (A) (by C-G formula based on SCr of 2.91 mg/dL (H)).  Liver Function Tests: Recent Labs  Lab 05/13/20 0028 05/19/20 0936  AST 34  --   ALT 50*  --   ALKPHOS 71  --   BILITOT 0.9  --   PROT 7.4  --   ALBUMIN 3.2* 2.9*    CBG: Recent Labs  Lab 05/18/20 1552 05/18/20 2121 05/19/20 0753 05/19/20 1134 05/19/20 1607  GLUCAP 114* 168* 103* 163* 82     Recent Results (from the past 240 hour(s))  Respiratory Panel by RT PCR (Flu A&B, Covid) - Nasopharyngeal Swab     Status: None   Collection Time: 05/12/20  7:33 PM   Specimen: Nasopharyngeal Swab  Result Value Ref Range Status   SARS Coronavirus 2 by RT PCR NEGATIVE NEGATIVE Final    Comment: (NOTE) SARS-CoV-2 target nucleic acids are NOT DETECTED.  The SARS-CoV-2 RNA is generally detectable in upper respiratoy specimens during the acute phase of infection. The lowest concentration of SARS-CoV-2 viral copies this assay can detect is 131 copies/mL. A negative result does not preclude SARS-Cov-2 infection and should not be used as the sole basis for treatment or other patient management decisions. A negative result may occur with  improper specimen collection/handling, submission of specimen other than nasopharyngeal swab, presence of viral mutation(s) within the areas targeted by this assay, and inadequate number of viral copies (<131 copies/mL). A negative result must be combined with clinical observations, patient history,  and epidemiological information. The expected result is Negative.  Fact Sheet for Patients:  PinkCheek.be  Fact Sheet for Healthcare Providers:  GravelBags.it  This test is no t yet approved or cleared by the [place] FDA and  has been authorized for detection and/or diagnosis of SARS-CoV-2 by FDA under an Emergency Use Authorization (EUA). This EUA will remain  in effect (meaning this test can be  used) for the duration of the COVID-19 declaration under Section 564(b)(1) of the Act, 21 U.S.C. section 360bbb-3(b)(1), unless the authorization is terminated or revoked sooner.     Influenza A by PCR NEGATIVE NEGATIVE Final   Influenza B by PCR NEGATIVE NEGATIVE Final    Comment: (NOTE) The Xpert Xpress SARS-CoV-2/FLU/RSV assay is intended as an aid in  the diagnosis of influenza from Nasopharyngeal swab specimens and  should not be used as a sole basis for treatment. Nasal washings and  aspirates are unacceptable for Xpert Xpress SARS-CoV-2/FLU/RSV  testing.  Fact Sheet for Patients: PinkCheek.be  Fact Sheet for Healthcare Providers: GravelBags.it  This test is not yet approved or cleared by the [place] FDA and  has been authorized for detection and/or diagnosis of SARS-CoV-2 by  FDA under an Emergency Use Authorization (EUA). This EUA will remain  in effect (meaning this test can be used) for the duration of the  Covid-19 declaration under Section 564(b)(1) of the Act, 21  U.S.C. section 360bbb-3(b)(1), unless the authorization is  terminated or revoked. Performed at [facility], [address] [place], [place] [address]   Urine culture     Status: Abnormal   Collection Time: 05/12/20  7:57 PM   Specimen: Urine, Random  Result Value Ref Range Status   Specimen Description   Final    URINE, RANDOM Performed at [facility], [address]  [place], [place] [address]    Special Requests   Final    NONE Performed at [facility], [address], [place], [place] [address]    Culture MULTIPLE SPECIES PRESENT, SUGGEST RECOLLECTION (A)  Final   Report Status 05/14/2020 FINAL  Final         Radiology Studies: No results found.      Scheduled Meds: . amLODipine  5 mg Oral Daily  . aspirin EC  81 mg Oral Daily  . atorvastatin  40 mg Oral Daily  . darbepoetin (ARANESP) injection - NON-DIALYSIS  100 mcg Subcutaneous Q Tue-1800  . enoxaparin (LOVENOX) injection  30 mg Subcutaneous Q24H  . ezetimibe  10 mg Oral Daily  . ferrous sulfate  325 mg Oral BID  . furosemide  40 mg Intravenous Q12H  . gabapentin  300 mg Oral BID  . hydrALAZINE  50 mg Oral Q8H  . insulin aspart  0-9 Units Subcutaneous TID WC  . insulin glargine  22 Units Subcutaneous Daily  . isosorbide mononitrate  30 mg Oral Daily  . metoprolol succinate  25 mg Oral Daily  . potassium chloride  20 mEq Oral BID   Continuous Infusions:   LOS: 6 days       Hosie Poisson, MD [facility]   To contact the attending provider between 7A-7P or the covering provider during after hours 7P-7A, please log into the web site [email or web address] and access using universal  password for that web site. If you do not have the password, please call the hospital operator.  05/19/2020, 5:20 PM

## 2020-05-20 DIAGNOSIS — I5041 Acute combined systolic (congestive) and diastolic (congestive) heart failure: Secondary | ICD-10-CM | POA: Diagnosis not present

## 2020-05-20 DIAGNOSIS — N1832 Chronic kidney disease, stage 3b: Secondary | ICD-10-CM | POA: Diagnosis not present

## 2020-05-20 LAB — BASIC METABOLIC PANEL
Anion gap: 9 (ref 5–15)
BUN: 52 mg/dL — ABNORMAL HIGH (ref 8–23)
CO2: 25 mmol/L (ref 22–32)
Calcium: 9.5 mg/dL (ref 8.9–10.3)
Chloride: 103 mmol/L (ref 98–111)
Creatinine, Ser: 3.27 mg/dL — ABNORMAL HIGH (ref 0.44–1.00)
GFR, Estimated: 14 mL/min — ABNORMAL LOW (ref >60)
Glucose, Bld: 96 mg/dL (ref 70–99)
Potassium: 4.9 mmol/L (ref 3.5–5.1)
Sodium: 137 mmol/L (ref 135–145)

## 2020-05-20 LAB — GLUCOSE, CAPILLARY
Glucose-Capillary: 99 mg/dL (ref 70–99)
Glucose-Capillary: 194 mg/dL — ABNORMAL HIGH (ref 70–99)
Glucose-Capillary: 191 mg/dL — ABNORMAL HIGH (ref 70–99)
Glucose-Capillary: 166 mg/dL — ABNORMAL HIGH (ref 70–99)

## 2020-05-20 MED ORDER — INSULIN GLARGINE 100 UNIT/ML ~~LOC~~ SOLN
18.0000 [IU] | Freq: Every day | SUBCUTANEOUS | Status: DC
  Administered 2020-05-20 – 2020-05-21 (×2): 18 [IU] via SUBCUTANEOUS
  Filled 2020-05-20 (×5): qty 0.18

## 2020-05-20 MED ORDER — POTASSIUM CHLORIDE CRYS ER 20 MEQ PO TBCR
20.0000 meq | EXTENDED_RELEASE_TABLET | Freq: Every day | ORAL | Status: DC
  Administered 2020-05-20 – 2020-05-21 (×2): 20 meq via ORAL
  Filled 2020-05-20: qty 1

## 2020-05-20 MED ORDER — FUROSEMIDE 40 MG PO TABS
40.0000 mg | ORAL_TABLET | Freq: Two times a day (BID) | ORAL | Status: DC
  Administered 2020-05-20 – 2020-05-21 (×3): 40 mg via ORAL
  Filled 2020-05-20 (×3): qty 1

## 2020-05-20 MED ORDER — INSULIN GLARGINE 100 UNIT/ML ~~LOC~~ SOLN
18.0000 [IU] | Freq: Every day | SUBCUTANEOUS | Status: DC
  Filled 2020-05-20: qty 0.18

## 2020-05-20 NOTE — Progress Notes (Signed)
PROGRESS NOTE    Maria Parrish  UDJ:[id number] DOB: [birth year] DOA: 05/12/2020 PCP: Vidal Schwalbe, MD    Chief Complaint  Patient presents with  . Fall    Brief Narrative:  74 year old lady with prior history of dementia, type 2 diabetes, hypercalcemia, hyperparathyroidism, hypertension, hyperlipidemia, peripheral neuropathy presents with a fall and she was found to be eating acute on chronic diastolic heart failure.  Repeat echocardiogram ordered showed left ventricular ejection fraction of 40 to 45% with grade 2 diastolic dysfunction.  Cardiology / nephrology on board. She appears comfortable today , appropriately diuresed. Her IV lasix has been changed to oral lasix 40 mg bid, plan to watch her overnight and monitor renal parameters in the morning and possible d/c in am .    Assessment & Plan:   Principal Problem:   Acute on chronic diastolic congestive heart failure (HCC) Active Problems:   Uncontrolled type 2 diabetes mellitus (HCC)   Hypokalemia   Essential hypertension, benign   Mixed hyperlipidemia   Dementia (HCC)   Left hemiparesis (HCC)   Elevated serum creatinine   Acute on chronic diastolic (congestive) heart failure (HCC)   Acute on chronic diastolic heart failure, acute systolic heart failure Last echocardiogram significant for reduced left ventricular ejection fraction and grade 2 diastolic dysfunction. She is appropriately diuresed with IV Lasix transition to oral Lasix 40 mg twice daily.  Her weights are inaccurate at this time.  If creatinine remains okay on oral Lasix possible discharge tomorrow.  Strict intake and output and daily weights. Appreciate cardiology and nephrology input.        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  No signs of uremia or indication of dialysis. On diuresis with IV Lasix 40 twice daily consideration to oral Lasix twice daily. Appreciate nephrology recommendations and if creatinine remains okay  possible discharge tomorrow 8.27 today.   Essential hypertension Blood pressure parameters are optimal.  Continue with Norvasc 5 mg, hydralazine 50 mg 3 times daily, metoprolol 25 mg daily and Imdur 30 mg daily..   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 Transfuse to keep hemoglobin greater than 7 .  Hyperlipidemia continue with Lipitor and Zetia.   Mild dementia No behavioral abnormalities at this time.   Insulin-dependent diabetes mellitus CBG (last 3)  Recent Labs    05/19/20 1607 05/19/20 2245 05/20/20 0733  GLUCAP 82 124* 99   Hemoglobin A1c 7.9, decrease Lantus to 18 units continue with sliding scale insulin.   DVT prophylaxis: (Heparin Code Status: (Full code  Family Communication: None at bedside Disposition:   Status is: Inpatient  Remains inpatient appropriate because:Ongoing diagnostic testing needed not appropriate for outpatient work up and Inpatient level of care appropriate due to severity of illness to monitor renal parameters and possible discharge tomorrow   Dispo: The patient is from: [facility]              Anticipated d/c is to: [facility]              Anticipated d/c date is: 1 day              Patient currently is not medically stable to d/c.       Consultants:   Cardiology  Nephrology  Procedures: None Antimicrobials: None  Subjective: Feels okay no new complaints today  Objective: Vitals:   05/19/20 2043 05/20/20 0447 05/20/20 0448 05/20/20 0820  BP: (!) 156/79 (!) 162/74  (!) 157/57  Pulse: 67 66  70  Resp: 20 19  19   Temp: 98.5 F (36.9 C) 97.6 F (36.4 C)  98.3 F (36.8 C)  TempSrc: Oral Oral  Oral  SpO2: 99% 98%  98%  Weight:   71 kg   Height:        Intake/Output Summary (Last 24 hours) at 05/20/2020 1121 Last data filed at 05/20/2020 0851 Gross per 24 hour  Intake 714.86 ml  Output 2150 ml  Net -1435.14  ml   Filed Weights   05/17/20 0515 05/18/20 0600 05/20/20 0448  Weight: 72.6 kg 71.4 kg 71 kg    Examination:  General exam: Calm and comfortable Respiratory system: Diminished at bases no wheezing or rhonchi Cardiovascular system: No JVD trace pedal edema Gastrointestinal system: Abdomen is soft, nontender bowel sounds normal Central nervous system: Alert and answering questions appropriately grossly nonfocal Extremities: Trace leg edema Skin: No ulcers seen Psychiatry: Mood is appropriate   Data Reviewed: I have personally reviewed following labs and imaging studies  CBC: Recent Labs  Lab 05/15/20 0550 05/19/20 0337  WBC 7.4 5.9  HGB 9.3* 8.4*  HCT 29.0* 26.5*  MCV 92.7 90.4  PLT 364 536    Basic Metabolic Panel: Recent Labs  Lab 05/14/20 0724 05/15/20 0550 05/16/20 0546 05/17/20 0424 05/18/20 0609 05/19/20 0936 05/20/20 0407  NA  --    < > 137 137 138 136 137  K  --    < > 3.8 3.8 3.8 4.8 4.9  CL  --    < > 108 107 106 104 103  CO2  --    < > 19* 22 23 24 25   GLUCOSE  --    < > 94 73 89 155* 96  BUN  --    < > 44* 45* 49* 47* 52*  CREATININE  --    < > 2.88* 2.83* 3.05* 2.91* 3.27*  CALCIUM  --    < > 8.7* 9.3 9.2 9.4 9.5  MG 2.2  --   --   --   --   --   --   PHOS  --   --   --   --   --  4.1  --    < > = values in this interval not displayed.    GFR: Estimated Creatinine Clearance: 14.8 mL/min (A) (by C-G formula based on SCr of 3.27 mg/dL (H)).  Liver Function Tests: Recent Labs  Lab 05/19/20 0936  ALBUMIN 2.9*    CBG: Recent Labs  Lab 05/19/20 0753 05/19/20 1134 05/19/20 1607 05/19/20 2245 05/20/20 0733  GLUCAP 103* 163* 82 124* 99     Recent Results (from the past 240 hour(s))  Respiratory Panel by RT PCR (Flu A&B, Covid) - Nasopharyngeal Swab     Status: None   Collection Time: 05/12/20  7:33 PM   Specimen: Nasopharyngeal Swab  Result Value Ref Range Status   SARS Coronavirus 2 by RT PCR NEGATIVE NEGATIVE Final    Comment:  (NOTE) SARS-CoV-2 target nucleic acids are NOT DETECTED.  The SARS-CoV-2 RNA is generally detectable in upper respiratoy specimens during the acute phase of infection. The lowest concentration of SARS-CoV-2 viral copies this assay can detect is 131 copies/mL. A negative result does not preclude SARS-Cov-2 infection and should not be used as the sole basis for treatment or other patient management decisions. A negative result may occur with  improper specimen collection/handling, submission of  specimen other than nasopharyngeal swab, presence of viral mutation(s) within the areas targeted by this assay, and inadequate number of viral copies (<131 copies/mL). A negative result must be combined with clinical observations, patient history, and epidemiological information. The expected result is Negative.  Fact Sheet for Patients:  PinkCheek.be  Fact Sheet for Healthcare Providers:  GravelBags.it  This test is no t yet approved or cleared by the [place] FDA and  has been authorized for detection and/or diagnosis of SARS-CoV-2 by FDA under an Emergency Use Authorization (EUA). This EUA will remain  in effect (meaning this test can be used) for the duration of the COVID-19 declaration under Section 564(b)(1) of the Act, 21 U.S.C. section 360bbb-3(b)(1), unless the authorization is terminated or revoked sooner.     Influenza A by PCR NEGATIVE NEGATIVE Final   Influenza B by PCR NEGATIVE NEGATIVE Final    Comment: (NOTE) The Xpert Xpress SARS-CoV-2/FLU/RSV assay is intended as an aid in  the diagnosis of influenza from Nasopharyngeal swab specimens and  should not be used as a sole basis for treatment. Nasal washings and  aspirates are unacceptable for Xpert Xpress SARS-CoV-2/FLU/RSV  testing.  Fact Sheet for Patients: PinkCheek.be  Fact Sheet for Healthcare  Providers: GravelBags.it  This test is not yet approved or cleared by the [place] FDA and  has been authorized for detection and/or diagnosis of SARS-CoV-2 by  FDA under an Emergency Use Authorization (EUA). This EUA will remain  in effect (meaning this test can be used) for the duration of the  Covid-19 declaration under Section 564(b)(1) of the Act, 21  U.S.C. section 360bbb-3(b)(1), unless the authorization is  terminated or revoked. Performed at [facility], [address] [place], [place] [address]   Urine culture     Status: Abnormal   Collection Time: 05/12/20  7:57 PM   Specimen: Urine, Random  Result Value Ref Range Status   Specimen Description   Final    URINE, RANDOM Performed at [facility], [address], [place], [place] [address]    Special Requests   Final    NONE Performed at [facility], [address] [place], [place] [address]    Culture MULTIPLE SPECIES PRESENT, SUGGEST RECOLLECTION (A)  Final   Report Status 05/14/2020 FINAL  Final         Radiology Studies: No results found.      Scheduled Meds: . amLODipine  5 mg Oral Daily  . aspirin EC  81 mg Oral Daily  . atorvastatin  40 mg Oral Daily  . darbepoetin (ARANESP) injection - NON-DIALYSIS  100 mcg Subcutaneous Q Tue-1800  . enoxaparin (LOVENOX) injection  30 mg Subcutaneous Q24H  . ezetimibe  10 mg Oral Daily  . ferrous sulfate  325 mg Oral BID  . furosemide  40 mg Oral BID  . gabapentin  300 mg Oral BID  . hydrALAZINE  50 mg Oral Q8H  . insulin aspart  0-9 Units Subcutaneous TID WC  . insulin glargine  22 Units Subcutaneous Daily  . isosorbide mononitrate  30 mg Oral Daily  . metoprolol succinate  25 mg Oral Daily  . potassium chloride  20 mEq Oral Daily   Continuous Infusions:   LOS: 7 days       Hosie Poisson, MD [facility]   To contact the attending provider between 7A-7P or the covering provider during after hours 7P-7A, please  log into the web site [email or web address] and access using universal  password  for that web site. If you do not have the password, please call the hospital operator.  05/20/2020, 11:21 AM

## 2020-05-20 NOTE — Care Management Important Message (Signed)
Important Message  Patient Details  Name: Maria Parrish MRN: [id number] Date of Birth: [birth year]   Medicare Important Message Given:  Yes     Tommy Medal 05/20/2020, 12:05 PM

## 2020-05-20 NOTE — Progress Notes (Signed)
[facility] Progress Note    Assessment/ Plan:   Assessment/Plan: 74 year old BF with DM, HTN - possibly poorly controlled- with crt of 1.5 11 mos ago-  Now presenting after a fall but found to be volume overloaded with crt in the mid to high 2's  1.AKI on CKD vs Progressive CKD- I suspect a lot of this is chronic given longstanding HTN and DM and 3 grams of proteinuria as well as volume overload and anemia likely as a result of her CKD.  No uremic sxms and no indication for dialysis.  Have transitioned to Lasix PO.  Having good UOP and doesn't appear volume overloaded, ? Accuracy of weights.      2. Hypertension/volume- BP adequate, Lasix as above.    3. Anemia  - iron deficient s/p IV iron and Darbe 100 q Tuesday  4. Hypokalemia- decrease supps to daily and follow.    5. Dispo: if Cr/ weights OK on PO Lasix could d/c tomorrow if cardiology in agreement as well  Subjective:    Cr fluctuating a little, Stable yesterday but up this AM.  Transitioned to PO Lasix for today. Comfortable, sitting up in bed, no complaints.     Objective:   BP (!) 157/57 (BP Location: Left Arm)   Pulse 70   Temp 97.6 F (36.4 C) (Oral)   Resp 19   Ht 5\' 4"  (1.626 m)   Wt 71 kg   SpO2 98%   BMI 26.87 kg/m   Intake/Output Summary (Last 24 hours) at 05/20/2020 0900 Last data filed at 05/20/2020 0849 Gross per 24 hour  Intake 594.86 ml  Output 2150 ml  Net -1555.14 ml   Weight change:   Physical Exam: Gen: NAD, sitting in bed, no O2 CVS: RRR Resp: clear bilaterally Abd: soft, nontender NABS Ext: trace LE edema  Imaging: No results found.  Labs: BMET Recent Labs  Lab 05/14/20 0500 05/15/20 0550 05/16/20 0546 05/17/20 0424 05/18/20 0609 05/19/20 0936 05/20/20 0407  NA 139 138 137 137 138 136 137  K 3.1* 3.8 3.8 3.8 3.8 4.8 4.9  CL 108 107 108 107 106 104 103  CO2 20* 19* 19* 22 23 24 25   GLUCOSE 218* 135* 94 73 89 155* 96  BUN 46* 45* 44* 45* 49* 47* 52*   CREATININE 2.80* 2.84* 2.88* 2.83* 3.05* 2.91* 3.27*  CALCIUM 8.8* 8.8* 8.7* 9.3 9.2 9.4 9.5  PHOS  --   --   --   --   --  4.1  --    CBC Recent Labs  Lab 05/15/20 0550 05/19/20 0337  WBC 7.4 5.9  HGB 9.3* 8.4*  HCT 29.0* 26.5*  MCV 92.7 90.4  PLT 364 343    Medications:    . amLODipine  5 mg Oral Daily  . aspirin EC  81 mg Oral Daily  . atorvastatin  40 mg Oral Daily  . darbepoetin (ARANESP) injection - NON-DIALYSIS  100 mcg Subcutaneous Q Tue-1800  . enoxaparin (LOVENOX) injection  30 mg Subcutaneous Q24H  . ezetimibe  10 mg Oral Daily  . ferrous sulfate  325 mg Oral BID  . furosemide  40 mg Oral BID  . gabapentin  300 mg Oral BID  . hydrALAZINE  50 mg Oral Q8H  . insulin aspart  0-9 Units Subcutaneous TID WC  . insulin glargine  22 Units Subcutaneous Daily  . isosorbide mononitrate  30 mg Oral Daily  . metoprolol succinate  25 mg Oral Daily  .  potassium chloride  20 mEq Oral BID      Madelon Lips MD 05/20/2020, 9:00 AM

## 2020-05-20 NOTE — Progress Notes (Signed)
Progress Note  Patient Name: Maria Parrish Date of Encounter: 05/20/2020  [facility] Cardiologist: Carlyle Dolly, MD   Subjective   SOB has resolved  Inpatient Medications    Scheduled Meds: . amLODipine  5 mg Oral Daily  . aspirin EC  81 mg Oral Daily  . atorvastatin  40 mg Oral Daily  . darbepoetin (ARANESP) injection - NON-DIALYSIS  100 mcg Subcutaneous Q Tue-1800  . enoxaparin (LOVENOX) injection  30 mg Subcutaneous Q24H  . ezetimibe  10 mg Oral Daily  . ferrous sulfate  325 mg Oral BID  . furosemide  40 mg Oral BID  . gabapentin  300 mg Oral BID  . hydrALAZINE  50 mg Oral Q8H  . insulin aspart  0-9 Units Subcutaneous TID WC  . insulin glargine  22 Units Subcutaneous Daily  . isosorbide mononitrate  30 mg Oral Daily  . metoprolol succinate  25 mg Oral Daily  . potassium chloride  20 mEq Oral Daily   Continuous Infusions:  PRN Meds: acetaminophen **OR** acetaminophen, ondansetron **OR** ondansetron (ZOFRAN) IV, polyethylene glycol   Vital Signs    Vitals:   05/19/20 2043 05/20/20 0447 05/20/20 0448 05/20/20 0820  BP: (!) 156/79 (!) 162/74  (!) 157/57  Pulse: 67 66  70  Resp: 20 19  19   Temp: 98.5 F (36.9 C) 97.6 F (36.4 C)  98.3 F (36.8 C)  TempSrc: Oral Oral  Oral  SpO2: 99% 98%  98%  Weight:   71 kg   Height:        Intake/Output Summary (Last 24 hours) at 05/20/2020 0944 Last data filed at 05/20/2020 0851 Gross per 24 hour  Intake 714.86 ml  Output 2150 ml  Net -1435.14 ml   Last 3 Weights 05/20/2020 05/18/2020 05/17/2020  Weight (lbs) 156 lb 8.4 oz 157 lb 6.4 oz 160 lb  Weight (kg) 71 kg 71.396 kg 72.576 kg      Telemetry    SR- Personally Reviewed  ECG    n/a - Personally Reviewed  Physical Exam   GEN: No acute distress.   Neck: No JVD Cardiac: RRR, no murmurs, rubs, or gallops.  Respiratory: faint crackles bilateral bases GI: Soft, nontender, non-distended  MS: trace bilateral edema; No deformity. Neuro:  Nonfocal    Psych: Normal affect   Labs    High Sensitivity Troponin:   Recent Labs  Lab 05/12/20 1652 05/13/20 0028  TROPONINIHS 12 13      Chemistry Recent Labs  Lab 05/18/20 0609 05/19/20 0936 05/20/20 0407  NA 138 136 137  K 3.8 4.8 4.9  CL 106 104 103  CO2 23 24 25   GLUCOSE 89 155* 96  BUN 49* 47* 52*  CREATININE 3.05* 2.91* 3.27*  CALCIUM 9.2 9.4 9.5  ALBUMIN  --  2.9*  --   GFRNONAA 16* 17* 14*  ANIONGAP 9 8 9      Hematology Recent Labs  Lab 05/15/20 0550 05/19/20 0337  WBC 7.4 5.9  RBC 3.13* 2.93*  HGB 9.3* 8.4*  HCT 29.0* 26.5*  MCV 92.7 90.4  MCH 29.7 28.7  MCHC 32.1 31.7  RDW 16.1* 15.8*  PLT 364 343    BNPNo results for input(s): BNP, PROBNP in the last 168 hours.   DDimer No results for input(s): DDIMER in the last 168 hours.   Radiology    No results found.  Cardiac Studies     Patient Profile     74 y.o. female w/ PMH of chronic  diastolic CHF, HTN, HLD, Type 2 DM, Stage 3 CKD and dementia who is currently admitted with an acute CHF exacerbation. Repeat echo shows EF now reduced at 40-45%.   Assessment & Plan    1. Acute on chronic combined systolic/diastolic HF  - Presented with worsening dyspnea and lower extremity edema, found to have an acute CHF exacerbation with BNP elevated to 1352. Repeat echo shows her EF is now reduced at 40-45% with Grade 2 DD and global HK.  - continue toprol 25mg  daily. No ACE/ARB/ARNI/aldactone due to renal dysfunction - we have deferred diuretic dosing to nephrology, agree with transition to oral 40mg  bid today.   2. AKI on CKD - followed by neprhology   No further cardiology recs, we will defer final dosing of diuretic to nephrology. Would be ok with discharge from our standpoint tomorrow. We will sign off inpatient care and arrange outpatient f/u   Carlyle Dolly MD   For questions or updates, please contact [place] Please consult [email or web address] for contact info under         Signed, Carlyle Dolly, MD  05/20/2020, 9:44 AM

## 2020-05-21 LAB — BASIC METABOLIC PANEL
Anion gap: 10 (ref 5–15)
BUN: 52 mg/dL — ABNORMAL HIGH (ref 8–23)
CO2: 26 mmol/L (ref 22–32)
Calcium: 9.9 mg/dL (ref 8.9–10.3)
Chloride: 101 mmol/L (ref 98–111)
Creatinine, Ser: 3.15 mg/dL — ABNORMAL HIGH (ref 0.44–1.00)
GFR, Estimated: 15 mL/min — ABNORMAL LOW (ref >60)
Glucose, Bld: 100 mg/dL — ABNORMAL HIGH (ref 70–99)
Potassium: 4.8 mmol/L (ref 3.5–5.1)
Sodium: 137 mmol/L (ref 135–145)

## 2020-05-21 LAB — GLUCOSE, CAPILLARY
Glucose-Capillary: 99 mg/dL (ref 70–99)
Glucose-Capillary: 144 mg/dL — ABNORMAL HIGH (ref 70–99)

## 2020-05-21 MED ORDER — METOPROLOL SUCCINATE ER 25 MG PO TB24
25.0000 mg | ORAL_TABLET | Freq: Every day | ORAL | 1 refills | Status: DC
Start: 2020-05-22 — End: 2020-09-15

## 2020-05-21 MED ORDER — ISOSORBIDE MONONITRATE ER 30 MG PO TB24
30.0000 mg | ORAL_TABLET | Freq: Every day | ORAL | 1 refills | Status: DC
Start: 2020-05-22 — End: 2020-12-29

## 2020-05-21 MED ORDER — POTASSIUM CHLORIDE CRYS ER 20 MEQ PO TBCR
20.0000 meq | EXTENDED_RELEASE_TABLET | Freq: Every day | ORAL | 1 refills | Status: DC
Start: 2020-05-22 — End: 2020-09-15

## 2020-05-21 MED ORDER — HYDRALAZINE HCL 50 MG PO TABS
50.0000 mg | ORAL_TABLET | Freq: Three times a day (TID) | ORAL | 1 refills | Status: DC
Start: 2020-05-21 — End: 2020-09-15

## 2020-05-21 MED ORDER — TRESIBA FLEXTOUCH 100 UNIT/ML ~~LOC~~ SOPN: 18.0000 [IU] | PEN_INJECTOR | Freq: Every day | SUBCUTANEOUS | 2 refills | Status: DC

## 2020-05-21 MED ORDER — DARBEPOETIN ALFA 100 MCG/0.5ML IJ SOSY: 100.0000 ug | PREFILLED_SYRINGE | INTRAMUSCULAR | Status: DC

## 2020-05-21 MED ORDER — FUROSEMIDE 40 MG PO TABS
40.0000 mg | ORAL_TABLET | Freq: Two times a day (BID) | ORAL | 1 refills | Status: DC
Start: 2020-05-21 — End: 2020-08-20

## 2020-05-21 NOTE — TOC Transition Note (Signed)
Transition of Care [facility]) - CM/SW Discharge Note  Patient Details  Name: Maria Parrish MRN: [id number] Date of Birth: Nov 27, 1945  [facility] CM/SW Contact:  Sherie Don, LCSW Phone Number: 05/21/2020, 12:29 PM  Clinical Narrative: CSW notified Tim with [facility] patient is discharging. Orders have been placed. PCP appointment and HH are on AVS. Discharge summary faxed to PCP per request. TOC signing off.  Final next level of care: [place] Barriers to Discharge: Barriers Resolved  Patient Goals and CMS Choice Patient states their goals for this hospitalization and ongoing recovery are:: Discharge home with [facility] CMS Medicare.gov Compare Post Acute Care list provided to:: Patient Represenative (must comment) Maria Parrish (son)) Choice offered to / list presented to : Adult Children  Discharge Plan and Services In-house Referral: Clinical Social Work Post Acute Care Choice: Home Health          DME Arranged: N/A DME Agency: NA HH Arranged: PT, Nurse's Aide [place] Agency: [facility] [employer] [facility] [employer] Date [place]: 05/21/20 Time [place]: 1224 Representative spoke with at [place]: Tim  Readmission Risk Interventions No flowsheet data found.

## 2020-05-21 NOTE — Discharge Summary (Signed)
Physician Discharge Summary  Maria Parrish:[id number] DOB: August 10, 1945 DOA: 05/12/2020  PCP: Vidal Schwalbe, MD  Admit date: 05/12/2020 Discharge date: 05/21/2020  Admitted From: home  Disposition:  Home with HOme health.   Recommendations for Outpatient Follow-up:  1. Follow up with PCP in 1-2 weeks 2. Please obtain BMP/CBC in one week 3. Please follow up with nephrology in one week.  4. Please follow up with cardiology in one week.   Home Health: yes.   Discharge Condition: Guarded.  CODE STATUS: FULL CODE.  Diet recommendation: Heart Healthy / Carb Modified   Brief/Interim Summary: 74 year old lady with prior history of dementia, type 2 diabetes, hypercalcemia, hyperparathyroidism, hypertension, hyperlipidemia, peripheral neuropathy presents with a fall and she was found to be eating acute on chronic diastolic heart failure.  Repeat echocardiogram ordered showed left ventricular ejection fraction of 40 to 45% with grade 2 diastolic dysfunction.  Cardiology / nephrology on board. She appears comfortable today , appropriately diuresed. Her IV lasix has been changed to oral lasix 40 mg bid, RENAL parameters have remained stable and she is being discharged home today.    Discharge Diagnoses:  Principal Problem:   Acute on chronic diastolic congestive heart failure (HCC) Active Problems:   Uncontrolled type 2 diabetes mellitus (HCC)   Hypokalemia   Essential hypertension, benign   Mixed hyperlipidemia   Dementia (HCC)   Left hemiparesis (HCC)   Elevated serum creatinine   Acute on chronic diastolic (congestive) heart failure (HCC)  Acute on chronic diastolic heart failure, acute systolic heart failure Last echocardiogram significant for reduced left ventricular ejection fraction and grade 2 diastolic dysfunction. She is appropriately diuresed with IV Lasix transition to oral Lasix 40 mg twice daily.  Her weights are inaccurate at this time.  creatinine remained stable on oral  lasix. D/c today.  Appreciate cardiology and nephrology recommendations.      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  No signs of uremia or indication of dialysis. On diuresis with IV Lasix 40 twice daily consideration to oral Lasix twice daily. Appreciate nephrology recommendations     Essential hypertension Blood pressure parameters are optimal.  Continue with Norvasc 5 mg, hydralazine 50 mg 3 times daily, metoprolol 25 mg daily and Imdur 30 mg daily..   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 Transfuse to keep hemoglobin greater than 7 .  Hyperlipidemia continue with Lipitor and Zetia.   Mild dementia No behavioral abnormalities at this time.   Insulin-dependent diabetes mellitus CBG (last 3)  Recent Labs    05/20/20 2142 05/21/20 0737 05/21/20 1125  GLUCAP 166* 99 144*   Decreased the dose of lantus to 18 units on discharge.    Discharge Instructions  Discharge Instructions    Diet - low sodium heart healthy   Complete by: As directed    Increase activity slowly   Complete by: As directed      Allergies as of 05/21/2020      Reactions   Penicillins Other (See Comments)   Makes patient sore. Has patient had a PCN reaction causing immediate rash, facial/tongue/throat swelling, SOB or lightheadedness with hypotension: No Has patient had a PCN reaction causing severe rash involving mucus membranes or skin necrosis: No Has patient had a PCN reaction that required hospitalization No Has patient had a PCN reaction occurring within the last 10 years: No If all of the above  answers are "NO", then may proceed with Cephalosporin use.   Statins Other (See Comments)   Makes wrists and fingers ache      Medication List    STOP taking these medications   folic acid 1 MG tablet Commonly known as: FOLVITE    metFORMIN 500 MG tablet Commonly known as: GLUCOPHAGE   metoprolol tartrate 25 MG tablet Commonly known as: LOPRESSOR   olmesartan-hydrochlorothiazide 40-25 MG tablet Commonly known as: BENICAR HCT   potassium chloride 10 MEQ tablet Commonly known as: KLOR-CON   spironolactone 25 MG tablet Commonly known as: ALDACTONE     TAKE these medications   Accu-Chek Guide Me w/Device Kit 1 Piece by Does not apply route as directed.   amLODipine 5 MG tablet Commonly known as: NORVASC Take by mouth.   aspirin EC 81 MG tablet Take 81 mg by mouth daily.   atorvastatin 40 MG tablet Commonly known as: LIPITOR Take 40 mg by mouth daily.   Darbepoetin Alfa 100 MCG/0.5ML Sosy injection Commonly known as: ARANESP Inject 0.5 mLs (100 mcg total) into the skin every Tuesday at 6 PM. Start taking on: May 25, 2020   ezetimibe 10 MG tablet Commonly known as: ZETIA daily.   FeroSul 325 (65 FE) MG tablet Generic drug: ferrous sulfate Take 325 mg by mouth 2 (two) times daily.   furosemide 40 MG tablet Commonly known as: LASIX Take 1 tablet (40 mg total) by mouth 2 (two) times daily.   gabapentin 300 MG capsule Commonly known as: NEURONTIN Take 300 mg by mouth 2 (two) times daily.   glucose blood test strip Commonly known as: Accu-Chek Guide Use as instructed 4 x daily. E11.65   hydrALAZINE 50 MG tablet Commonly known as: APRESOLINE Take 1 tablet (50 mg total) by mouth every 8 (eight) hours.   Insulin Pen Needle 31G X 8 MM Misc Commonly known as: B-D ULTRAFINE III SHORT PEN 1 each by Does not apply route as directed.   isosorbide mononitrate 30 MG 24 hr tablet Commonly known as: IMDUR Take 1 tablet (30 mg total) by mouth daily. Start taking on: May 22, 2020   metoprolol succinate 25 MG 24 hr tablet Commonly known as: TOPROL-XL Take 1 tablet (25 mg total) by mouth daily. Start taking on: May 22, 2020   polyethylene glycol 17 g packet Commonly known as:  MIRALAX / GLYCOLAX Take 17 g by mouth daily as needed for mild constipation.   potassium chloride SA 20 MEQ tablet Commonly known as: KLOR-CON Take 1 tablet (20 mEq total) by mouth daily. Start taking on: May 22, 2020   Tyler Aas FlexTouch 100 UNIT/ML FlexTouch Pen Generic drug: insulin degludec Inject 18 Units into the skin daily. What changed: how much to take   VITAMIN B-12 PO Take 1 tablet by mouth daily.   Vitamin D (Ergocalciferol) 1.25 MG (50000 UNIT) Caps capsule Commonly known as: DRISDOL Take 1 capsule (50,000 Units total) by mouth every 14 (fourteen) days.       Follow-up Information    [facility] Follow up.   Specialty: Home Health Services Why: HHPT and aide Contact information: [place] [address] [phone]        Vidal Schwalbe, MD Follow up on 05/31/2020.   Specialty: Family Medicine Why: Appointment is at 3:15pm Contact information: [address] [place] [address] [place] Follow up on 05/31/2020.   Specialty: Cardiology  Why: [facility] Follow-up on 05/31/2020 at 1:15 PM with Coletta Memos, NP (works with Dr. Harl Bowie) Contact information: [place] [phone]             Allergies  Allergen Reactions  . Penicillins Other (See Comments)    Makes patient sore.  Has patient had a PCN reaction causing immediate rash, facial/tongue/throat swelling, SOB or lightheadedness with hypotension: No Has patient had a PCN reaction causing severe rash involving mucus membranes or skin necrosis: No Has patient had a PCN reaction that required hospitalization No Has patient had a PCN reaction occurring within the last 10 years: No If all of the above answers are "NO", then may proceed with Cephalosporin use.   . Statins Other (See Comments)    Makes wrists and fingers ache    Consultations:  Cardiology  Nephrology.     Procedures/Studies: US RENAL  Result Date: 05/17/2020 CLINICAL DATA:  74 year old female with stage 4 chronic kidney disease. EXAM: RENAL / URINARY TRACT ULTRASOUND COMPLETE COMPARISON:  Renal ultrasound 07/21/2015. FINDINGS: Right Kidney: Renal measurements: 10.2 x 4.8 x 5.4 cm = volume: 138 mL. Preserved cortical echogenicity and cortical thickness (image 4). No right hydronephrosis or renal lesion. Left Kidney: Renal measurements: 9.3 x 4.7 x 5.3 cm = volume: 121 mL. Less well visualized than the right kidney but cortical echogenicity and thickness appear preserved on image 24. No left hydronephrosis or renal lesion. Bladder: Appears normal for degree of bladder distention. Both ureteral jets detected with Doppler. No urinary debris is evident. Other: None. IMPRESSION: Negative ultrasound appearance of both kidneys and the urinary bladder. Electronically Signed   By: Genevie Ann M.D.   On: 05/17/2020 13:56   DG Chest Port 1 View  Result Date: 05/12/2020 CLINICAL DATA:  Shortness of breath EXAM: PORTABLE CHEST 1 VIEW COMPARISON:  May 01, 2017 FINDINGS: The heart size and mediastinal contours are within normal limits. There is prominence of the central pulmonary vasculature. Aortic knob calcifications are seen. No acute osseous abnormality. IMPRESSION: Pulmonary vascular congestion. Electronically Signed   By: Prudencio Pair M.D.   On: 05/12/2020 20:53   ECHOCARDIOGRAM COMPLETE  Result Date: 05/13/2020    ECHOCARDIOGRAM REPORT   Patient Name:   Maria Parrish Date of Exam: 05/13/2020 Medical Rec #:  [id number]    Height:       64.0 in Accession #:    [id number]   Weight:       155.0 lb Date of Birth:  11-07-1945    BSA:          1.756 m Patient Age:    74 years     BP:           197/91 mmHg Patient Gender: F            HR:           68 bpm. Exam Location:  [facility] Procedure: 2D Echo Indications:    CHF-Acute Diastolic 761.95 / K93.26  History:        Patient has prior history of Echocardiogram  examinations, most                 recent 09/23/2015. Risk Factors:Hypertension, Diabetes,                 Dyslipidemia and Non-Smoker. Dementia.  Sonographer:    Leavy Cella RDCS (AE) Referring Phys: [id number] DAVID MANUEL [place]  1. Left ventricular ejection fraction, by estimation,  is 40 to 45%. The left ventricle has mildly decreased function. The left ventricle demonstrates global hypokinesis. The left ventricular internal cavity size was mildly dilated. There is mild left ventricular hypertrophy. Left ventricular diastolic parameters are consistent with Grade II diastolic dysfunction (pseudonormalization). Elevated left atrial pressure.  2. Right ventricular systolic function is mildly reduced. The right ventricular size is normal. There is moderately elevated pulmonary artery systolic pressure. The estimated right ventricular systolic pressure is 39.7 mmHg.  3. The mitral valve is grossly normal. Mild to moderate mitral valve regurgitation.  4. The aortic valve is tricuspid. Aortic valve regurgitation is not visualized. Mild aortic valve sclerosis is present, with no evidence of aortic valve stenosis.  5. The inferior vena cava is dilated in size with >50% respiratory variability, suggesting right atrial pressure of 8 mmHg. FINDINGS  Left Ventricle: Left ventricular ejection fraction, by estimation, is 40 to 45%. The left ventricle has mildly decreased function. The left ventricle demonstrates global hypokinesis. The left ventricular internal cavity size was mildly dilated. There is  mild left ventricular hypertrophy. Left ventricular diastolic parameters are consistent with Grade II diastolic dysfunction (pseudonormalization). Elevated left atrial pressure. Right Ventricle: The right ventricular size is normal. No increase in right ventricular wall thickness. Right ventricular systolic function is mildly reduced. There is moderately elevated pulmonary artery systolic pressure. The tricuspid  regurgitant velocity is 3.24 m/s, and with an assumed right atrial pressure of 8 mmHg, the estimated right ventricular systolic pressure is 67.3 mmHg. Left Atrium: Left atrial size was normal in size. Right Atrium: Right atrial size was normal in size. Pericardium: There is no evidence of pericardial effusion. Mitral Valve: The mitral valve is grossly normal. There is mild thickening of the mitral valve leaflet(s). Mild to moderate mitral valve regurgitation. Tricuspid Valve: The tricuspid valve is grossly normal. Tricuspid valve regurgitation is trivial. Aortic Valve: The aortic valve is tricuspid. There is mild to moderate aortic valve annular calcification. Aortic valve regurgitation is not visualized. Mild aortic valve sclerosis is present, with no evidence of aortic valve stenosis. Pulmonic Valve: The pulmonic valve was grossly normal. Pulmonic valve regurgitation is trivial. Aorta: The aortic root is normal in size and structure. Venous: The inferior vena cava is dilated in size with greater than 50% respiratory variability, suggesting right atrial pressure of 8 mmHg. IAS/Shunts: No atrial level shunt detected by color flow Doppler.  LEFT VENTRICLE PLAX 2D LVIDd:         5.23 cm  Diastology LVIDs:         4.10 cm  LV e' medial:    4.46 cm/s LV PW:         1.55 cm  LV E/e' medial:  19.5 LV IVS:        1.26 cm  LV e' lateral:   5.11 cm/s LVOT diam:     1.80 cm  LV E/e' lateral: 17.0 LVOT Area:     2.54 cm  RIGHT VENTRICLE RV S prime:     9.14 cm/s TAPSE (M-mode): 2.0 cm LEFT ATRIUM             Index       RIGHT ATRIUM           Index LA diam:        4.20 cm 2.39 cm/m  RA Area:     13.20 cm LA Vol (A2C):   59.4 ml 33.84 ml/m RA Volume:   32.70 ml  18.63 ml/m LA Vol (A4C):  44.3 ml 25.23 ml/m LA Biplane Vol: 53.5 ml 30.48 ml/m   AORTA Ao Root diam: 2.40 cm MITRAL VALVE               TRICUSPID VALVE MV Area (PHT): 4.83 cm    TR Peak grad:   42.0 mmHg MV Decel Time: 157 msec    TR Vmax:        324.00 cm/s  MR Peak grad: 111.5 mmHg MR Mean grad: 76.0 mmHg    SHUNTS MR Vmax:      528.00 cm/s  Systemic Diam: 1.80 cm MR Vmean:     411.0 cm/s MV E velocity: 87.00 cm/s MV A velocity: 63.40 cm/s MV E/A ratio:  1.37 Rozann Lesches MD Electronically signed by Rozann Lesches MD Signature Date/Time: 05/13/2020/4:17:50 PM    Final        Subjective: No new complaints.  Discharge Exam: Vitals:   05/21/20 0038 05/21/20 0410  BP: (!) 165/90 (!) 154/71  Pulse: 69 63  Resp: 16 17  Temp: 97.9 F (36.6 C) 97.6 F (36.4 C)  SpO2: 99% 99%   Vitals:   05/20/20 2202 05/21/20 0038 05/21/20 0410 05/21/20 0412  BP: (!) 152/109 (!) 165/90 (!) 154/71   Pulse: 71 69 63   Resp: '18 16 17   ' Temp: 98 F (36.7 C) 97.9 F (36.6 C) 97.6 F (36.4 C)   TempSrc:   Oral   SpO2: 99% 99% 99%   Weight:    69.3 kg  Height:        General: Pt is alert, awake, not in acute distress Cardiovascular: RRR, S1/S2 +, no rubs, no gallops Respiratory: diminished at bases, other wife no adventitious sounds.  Abdominal: Soft, NT, ND, bowel sounds + Extremities: trace edema. no cyanosis    The results of significant diagnostics from this hospitalization (including imaging, microbiology, ancillary and laboratory) are listed below for reference.     Microbiology: Recent Results (from the past 240 hour(s))  Respiratory Panel by RT PCR (Flu A&B, Covid) - Nasopharyngeal Swab     Status: None   Collection Time: 05/12/20  7:33 PM   Specimen: Nasopharyngeal Swab  Result Value Ref Range Status   SARS Coronavirus 2 by RT PCR NEGATIVE NEGATIVE Final    Comment: (NOTE) SARS-CoV-2 target nucleic acids are NOT DETECTED.  The SARS-CoV-2 RNA is generally detectable in upper respiratoy specimens during the acute phase of infection. The lowest concentration of SARS-CoV-2 viral copies this assay can detect is 131 copies/mL. A negative result does not preclude SARS-Cov-2 infection and should not be used as the sole basis for  treatment or other patient management decisions. A negative result may occur with  improper specimen collection/handling, submission of specimen other than nasopharyngeal swab, presence of viral mutation(s) within the areas targeted by this assay, and inadequate number of viral copies (<131 copies/mL). A negative result must be combined with clinical observations, patient history, and epidemiological information. The expected result is Negative.  Fact Sheet for Patients:  PinkCheek.be  Fact Sheet for Healthcare Providers:  GravelBags.it  This test is no t yet approved or cleared by the [place] FDA and  has been authorized for detection and/or diagnosis of SARS-CoV-2 by FDA under an Emergency Use Authorization (EUA). This EUA will remain  in effect (meaning this test can be used) for the duration of the COVID-19 declaration under Section 564(b)(1) of the Act, 21 U.S.C. section 360bbb-3(b)(1), unless the authorization is terminated or revoked sooner.  Influenza A by PCR NEGATIVE NEGATIVE Final   Influenza B by PCR NEGATIVE NEGATIVE Final    Comment: (NOTE) The Xpert Xpress SARS-CoV-2/FLU/RSV assay is intended as an aid in  the diagnosis of influenza from Nasopharyngeal swab specimens and  should not be used as a sole basis for treatment. Nasal washings and  aspirates are unacceptable for Xpert Xpress SARS-CoV-2/FLU/RSV  testing.  Fact Sheet for Patients: PinkCheek.be  Fact Sheet for Healthcare Providers: GravelBags.it  This test is not yet approved or cleared by the [place] FDA and  has been authorized for detection and/or diagnosis of SARS-CoV-2 by  FDA under an Emergency Use Authorization (EUA). This EUA will remain  in effect (meaning this test can be used) for the duration of the  Covid-19 declaration under Section 564(b)(1) of the Act, 21   U.S.C. section 360bbb-3(b)(1), unless the authorization is  terminated or revoked. Performed at [facility], [address], [place], [place] [address]   Urine culture     Status: Abnormal   Collection Time: 05/12/20  7:57 PM   Specimen: Urine, Random  Result Value Ref Range Status   Specimen Description   Final    URINE, RANDOM Performed at [facility], [address], [place], [place] [address]    Special Requests   Final    NONE Performed at [facility], [address], [place], [place] [address]    Culture MULTIPLE SPECIES PRESENT, SUGGEST RECOLLECTION (A)  Final   Report Status 05/14/2020 FINAL  Final     Labs: BNP (last 3 results) Recent Labs    05/12/20 1652  BNP 1,655.3*   Basic Metabolic Panel: Recent Labs  Lab 05/17/20 0424 05/18/20 0609 05/19/20 0936 05/20/20 0407 05/21/20 0929  NA 137 138 136 137 137  K 3.8 3.8 4.8 4.9 4.8  CL 107 106 104 103 101  CO2 '22 23 24 25 26  ' GLUCOSE 73 89 155* 96 100*  BUN 45* 49* 47* 52* 52*  CREATININE 2.83* 3.05* 2.91* 3.27* 3.15*  CALCIUM 9.3 9.2 9.4 9.5 9.9  PHOS  --   --  4.1  --   --    Liver Function Tests: Recent Labs  Lab 05/19/20 0936  ALBUMIN 2.9*   No results for input(s): LIPASE, AMYLASE in the last 168 hours. No results for input(s): AMMONIA in the last 168 hours. CBC: Recent Labs  Lab 05/15/20 0550 05/19/20 0337  WBC 7.4 5.9  HGB 9.3* 8.4*  HCT 29.0* 26.5*  MCV 92.7 90.4  PLT 364 343   Cardiac Enzymes: No results for input(s): CKTOTAL, CKMB, CKMBINDEX, TROPONINI in the last 168 hours. BNP: Invalid input(s): POCBNP CBG: Recent Labs  Lab 05/20/20 1127 05/20/20 1819 05/20/20 2142 05/21/20 0737 05/21/20 1125  GLUCAP 194* 191* 166* 99 144*   D-Dimer No results for input(s): DDIMER in the last 72 hours. Hgb A1c No results for input(s): HGBA1C in the last 72 hours. Lipid Profile No results for input(s): CHOL, HDL, LDLCALC, TRIG, CHOLHDL, LDLDIRECT in the last 72 hours. Thyroid function  studies No results for input(s): TSH, T4TOTAL, T3FREE, THYROIDAB in the last 72 hours.  Invalid input(s): FREET3 Anemia work up Recent Labs    05/19/20 0337  FERRITIN 87  TIBC 242*  IRON 45   Urinalysis    Component Value Date/Time   COLORURINE YELLOW 05/12/2020 [place] 05/12/2020 1614   LABSPEC 1.020 05/12/2020 1614   PHURINE 6.0 05/12/2020 1614   GLUCOSEU >=500 (A) 05/12/2020 1614  HGBUR MODERATE (A) 05/12/2020 1614   BILIRUBINUR NEGATIVE 05/12/2020 1614   KETONESUR 5 (A) 05/12/2020 1614   PROTEINUR >=300 (A) 05/12/2020 1614   UROBILINOGEN 2.0 (H) 03/25/2015 2203   NITRITE NEGATIVE 05/12/2020 1614   LEUKOCYTESUR NEGATIVE 05/12/2020 1614   Sepsis Labs Invalid input(s): PROCALCITONIN,  WBC,  LACTICIDVEN Microbiology Recent Results (from the past 240 hour(s))  Respiratory Panel by RT PCR (Flu A&B, Covid) - Nasopharyngeal Swab     Status: None   Collection Time: 05/12/20  7:33 PM   Specimen: Nasopharyngeal Swab  Result Value Ref Range Status   SARS Coronavirus 2 by RT PCR NEGATIVE NEGATIVE Final    Comment: (NOTE) SARS-CoV-2 target nucleic acids are NOT DETECTED.  The SARS-CoV-2 RNA is generally detectable in upper respiratoy specimens during the acute phase of infection. The lowest concentration of SARS-CoV-2 viral copies this assay can detect is 131 copies/mL. A negative result does not preclude SARS-Cov-2 infection and should not be used as the sole basis for treatment or other patient management decisions. A negative result may occur with  improper specimen collection/handling, submission of specimen other than nasopharyngeal swab, presence of viral mutation(s) within the areas targeted by this assay, and inadequate number of viral copies (<131 copies/mL). A negative result must be combined with clinical observations, patient history, and epidemiological information. The expected result is Negative.  Fact Sheet for Patients:   PinkCheek.be  Fact Sheet for Healthcare Providers:  GravelBags.it  This test is no t yet approved or cleared by the [place] FDA and  has been authorized for detection and/or diagnosis of SARS-CoV-2 by FDA under an Emergency Use Authorization (EUA). This EUA will remain  in effect (meaning this test can be used) for the duration of the COVID-19 declaration under Section 564(b)(1) of the Act, 21 U.S.C. section 360bbb-3(b)(1), unless the authorization is terminated or revoked sooner.     Influenza A by PCR NEGATIVE NEGATIVE Final   Influenza B by PCR NEGATIVE NEGATIVE Final    Comment: (NOTE) The Xpert Xpress SARS-CoV-2/FLU/RSV assay is intended as an aid in  the diagnosis of influenza from Nasopharyngeal swab specimens and  should not be used as a sole basis for treatment. Nasal washings and  aspirates are unacceptable for Xpert Xpress SARS-CoV-2/FLU/RSV  testing.  Fact Sheet for Patients: PinkCheek.be  Fact Sheet for Healthcare Providers: GravelBags.it  This test is not yet approved or cleared by the [place] FDA and  has been authorized for detection and/or diagnosis of SARS-CoV-2 by  FDA under an Emergency Use Authorization (EUA). This EUA will remain  in effect (meaning this test can be used) for the duration of the  Covid-19 declaration under Section 564(b)(1) of the Act, 21  U.S.C. section 360bbb-3(b)(1), unless the authorization is  terminated or revoked. Performed at [facility], [address] [place], [place] [address]   Urine culture     Status: Abnormal   Collection Time: 05/12/20  7:57 PM   Specimen: Urine, Random  Result Value Ref Range Status   Specimen Description   Final    URINE, RANDOM Performed at [facility], [address], [place], [place] [address]    Special Requests   Final    NONE Performed at [facility], [address], [place], [place] [address]    Culture MULTIPLE SPECIES PRESENT, SUGGEST RECOLLECTION (A)  Final   Report Status 05/14/2020 FINAL  Final     Time coordinating discharge: 35 minutes.   SIGNED:   Jeoffrey Massed  Karleen Hampshire, MD  [facility] 05/21/2020, 12:24 PM

## 2020-05-21 NOTE — Progress Notes (Signed)
  [facility] Progress Note    Assessment/ Plan:   Assessment/Plan: 74 year old BF with DM, HTN - possibly poorly controlled- with crt of 1.5 11 mos ago-  Now presenting after a fall but found to be volume overloaded with crt in the mid to high 2's  1.AKI on CKD vs Progressive CKD- I suspect a lot of this is chronic given longstanding HTN and DM and 3 grams of proteinuria as well as volume overload and anemia likely as a result of her CKD.  No uremic sxms and no indication for dialysis.  Continue lasix 40 PO BID.  If labs stable/ improved today could d/c with close followup.        2. Hypertension/volume- BP adequate, Lasix as above.    3. Anemia  - iron deficient s/p IV iron and Darbe 100 q Tuesday  4. Hypokalemia- decrease supps to daily and follow.    5. Dispo: pending labs  Subjective:    Seen in room.  No complaints, sleeping. Labs pending for today.     Objective:   BP (!) 154/71 (BP Location: Left Arm)   Pulse 63   Temp 97.6 F (36.4 C) (Oral)   Resp 17   Ht 5\' 4"  (1.626 m)   Wt 69.3 kg   SpO2 99%   BMI 26.22 kg/m   Intake/Output Summary (Last 24 hours) at 05/21/2020 1009 Last data filed at 05/20/2020 1700 Gross per 24 hour  Intake 240 ml  Output 1000 ml  Net -760 ml   Weight change: -1.7 kg  Physical Exam: Gen: NAD, sleeping no O2 CVS: RRR Resp: clear bilaterally Abd: soft, nontender NABS Ext: trace LE edema  Imaging: No results found.  Labs: BMET Recent Labs  Lab 05/15/20 0550 05/16/20 0546 05/17/20 0424 05/18/20 0609 05/19/20 0936 05/20/20 0407  NA 138 137 137 138 136 137  K 3.8 3.8 3.8 3.8 4.8 4.9  CL 107 108 107 106 104 103  CO2 19* 19* 22 23 24 25   GLUCOSE 135* 94 73 89 155* 96  BUN 45* 44* 45* 49* 47* 52*  CREATININE 2.84* 2.88* 2.83* 3.05* 2.91* 3.27*  CALCIUM 8.8* 8.7* 9.3 9.2 9.4 9.5  PHOS  --   --   --   --  4.1  --    CBC Recent Labs  Lab 05/15/20 0550 05/19/20 0337  WBC 7.4 5.9  HGB 9.3* 8.4*  HCT 29.0*  26.5*  MCV 92.7 90.4  PLT 364 343    Medications:    . amLODipine  5 mg Oral Daily  . aspirin EC  81 mg Oral Daily  . atorvastatin  40 mg Oral Daily  . darbepoetin (ARANESP) injection - NON-DIALYSIS  100 mcg Subcutaneous Q Tue-1800  . enoxaparin (LOVENOX) injection  30 mg Subcutaneous Q24H  . ezetimibe  10 mg Oral Daily  . ferrous sulfate  325 mg Oral BID  . furosemide  40 mg Oral BID  . gabapentin  300 mg Oral BID  . hydrALAZINE  50 mg Oral Q8H  . insulin aspart  0-9 Units Subcutaneous TID WC  . insulin glargine  18 Units Subcutaneous Daily  . isosorbide mononitrate  30 mg Oral Daily  . metoprolol succinate  25 mg Oral Daily  . potassium chloride  20 mEq Oral Daily      Madelon Lips MD 05/21/2020, 10:09 AM

## 2020-05-21 NOTE — Care Management Important Message (Signed)
Important Message  Patient Details  Name: Maria Parrish MRN: [id number] Date of Birth: [birth year]   Medicare Important Message Given:  Yes     Tommy Medal 05/21/2020, 1:25 PM

## 2020-05-27 DIAGNOSIS — N189 Chronic kidney disease, unspecified: Secondary | ICD-10-CM | POA: Insufficient documentation

## 2020-05-30 NOTE — Progress Notes (Signed)
Cardiology Clinic Note   Patient Name: Maria Parrish Date of Encounter: 05/31/2020  Primary Care Provider:  Vidal Schwalbe, MD Primary Cardiologist:  Carlyle Dolly, MD  Patient Profile    Maria Campbell. Parrish 74 year old female presents the clinic today for follow-up evaluation of her essential hypertension, and chronic diastolic CHF.  Past Medical History    Past Medical History:  Diagnosis Date  . Assistance needed for mobility    walks with walker  . Confusion   . Dementia ([place])   . Diabetes mellitus without complication ([place])   . Generalized weakness   . Hypercalcemia   . Hyperlipidemia   . Hyperparathyroidism ([place])   . Hypertension   . Hypertension   . Moderate protein-calorie malnutrition ([place])   . Peripheral neuropathy   . UTI (lower urinary tract infection)    Past Surgical History:  Procedure Laterality Date  . COLONOSCOPY N/A 08/12/2015   Procedure: COLONOSCOPY;  Surgeon: Daneil Dolin, MD;  Location: [facility];  Service: Endoscopy;  Laterality: N/A;  1245pm  . ESOPHAGOGASTRODUODENOSCOPY N/A 08/12/2015   Procedure: ESOPHAGOGASTRODUODENOSCOPY (EGD);  Surgeon: Daneil Dolin, MD;  Location: [facility];  Service: Endoscopy;  Laterality: N/A;  . none      Allergies  Allergies  Allergen Reactions  . Penicillins Other (See Comments)    Makes patient sore.  Has patient had a PCN reaction causing immediate rash, facial/tongue/throat swelling, SOB or lightheadedness with hypotension: No Has patient had a PCN reaction causing severe rash involving mucus membranes or skin necrosis: No Has patient had a PCN reaction that required hospitalization No Has patient had a PCN reaction occurring within the last 10 years: No If all of the above answers are "NO", then may proceed with Cephalosporin use.   . Statins Other (See Comments)    Makes wrists and fingers ache    History of Present Illness    Maria Parrish has a PMH of essential hypertension, chronic diastolic  CHF, type 2 diabetes, dementia, neuropathy, acute encephalopathy, hypokalemia, mixed hyperlipidemia, generalized weakness, and altered mental status.  She recently admitted to the hospital on 05/12/2020 and discharged on 05/21/2020.  She was treated for acute on chronic CHF.  She received IV Lasix and diuresed well.  She was transitioned to p.o. furosemide.  Her creatinine remained stable.  She was continued on amlodipine, hydralazine, metoprolol, and Imdur.  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  She received iron IV iron as well as supplemental p.o. iron.  She was seen by Dr. Harl Bowie on 05/20/2020 and her shortness of breath had resolved.  Her repeat echocardiogram showed an EF of 40-45%.,  G2 DD and global hypokinesis.  No ACE, ARB, Arni or Aldactone due to her renal dysfunction.  Diuretic dosing was deferred to nephrology.  She presents to the clinic today for follow-up evaluation states she feels fairly well.  Her weight is down 5 pounds from her admission at 147.  She states that she does not weigh herself routinely at home.  I will have her weigh daily after she voids in the morning.  She has noticed slight ankle edema and is following with nephrology who is dosing her diuresis.  We discussed the importance of following a low-sodium diet.  She is here with her son today who reviews her medications and states that she sometimes does not wake up until almost noon before she takes him on her medication.  I will give her the salty 6  diet sheet, have her increase her physical activity as tolerated, have her continue to monitor her blood pressure, and follow-up with Dr. Harl Bowie in 3 months.  Today she denies chest pain, shortness of breath, lower extremity edema, fatigue, palpitations, melena, hematuria, hemoptysis, diaphoresis, weakness, presyncope, syncope, orthopnea, and PND.   Home Medications    Prior to Admission medications   Medication Sig Start Date End Date Taking?  Authorizing Provider  amLODipine (NORVASC) 5 MG tablet Take by mouth.    [provider]  aspirin EC 81 MG tablet Take 81 mg by mouth daily.    [provider]  atorvastatin (LIPITOR) 40 MG tablet Take 40 mg by mouth daily.    [provider]  Blood Glucose Monitoring Suppl (ACCU-CHEK GUIDE ME) w/Device KIT 1 Piece by Does not apply route as directed. 09/02/18   Cassandria Anger, MD  Cyanocobalamin (VITAMIN B-12 PO) Take 1 tablet by mouth daily.    [provider]  Darbepoetin Alfa (ARANESP) 100 MCG/0.5ML SOSY injection Inject 0.5 mLs (100 mcg total) into the skin every Tuesday at 6 PM. 05/25/20   Hosie Poisson, MD  ezetimibe (ZETIA) 10 MG tablet daily. 01/28/18   [provider]  FEROSUL 325 (65 Fe) MG tablet Take 325 mg by mouth 2 (two) times daily. 02/08/20   [provider]  furosemide (LASIX) 40 MG tablet Take 1 tablet (40 mg total) by mouth 2 (two) times daily. 05/21/20   Hosie Poisson, MD  gabapentin (NEURONTIN) 300 MG capsule Take 300 mg by mouth 2 (two) times daily. 08/19/18   [provider]  glucose blood (ACCU-CHEK GUIDE) test strip Use as instructed 4 x daily. E11.65 09/03/18   Cassandria Anger, MD  hydrALAZINE (APRESOLINE) 50 MG tablet Take 1 tablet (50 mg total) by mouth every 8 (eight) hours. 05/21/20   Hosie Poisson, MD  insulin degludec (TRESIBA FLEXTOUCH) 100 UNIT/ML FlexTouch Pen Inject 18 Units into the skin daily. 05/21/20   Hosie Poisson, MD  Insulin Pen Needle (B-D ULTRAFINE III SHORT PEN) 31G X 8 MM MISC 1 each by Does not apply route as directed. 09/02/18   Cassandria Anger, MD  isosorbide mononitrate (IMDUR) 30 MG 24 hr tablet Take 1 tablet (30 mg total) by mouth daily. 05/22/20   Hosie Poisson, MD  metoprolol succinate (TOPROL-XL) 25 MG 24 hr tablet Take 1 tablet (25 mg total) by mouth daily. 05/22/20   Hosie Poisson, MD  polyethylene glycol (MIRALAX / GLYCOLAX) packet Take 17 g by mouth daily as needed for  mild constipation. 09/19/15   Nita Sells, MD  potassium chloride SA (KLOR-CON) 20 MEQ tablet Take 1 tablet (20 mEq total) by mouth daily. 05/22/20   Hosie Poisson, MD  Vitamin D, Ergocalciferol, (DRISDOL) 50000 units CAPS capsule Take 1 capsule (50,000 Units total) by mouth every 14 (fourteen) days. 02/26/17   Cassandria Anger, MD    Family History    Family History  Problem Relation Age of Onset  . Hypertension Brother   . Hypertension Brother   . Diabetes Brother   . Diabetes Father   . Heart attack Mother   . Colon cancer Neg Hx    She indicated that the status of her mother is unknown. She indicated that the status of her father is unknown. She indicated that both of her brothers are alive. She indicated that the status of her neg hx is unknown.  Social History    Social History   Socioeconomic History  .  Marital status: Widowed    Spouse name: Not on file  . Number of children: 2  . Years of education: Not on file  . Highest education level: Not on file  Occupational History  . 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 Smoking status: Never Smoker  . Smokeless tobacco: Never Used  Vaping Use  . Vaping Use: Never used  Substance and Sexual Activity  . Alcohol use: No  . Drug use: No  . Sexual activity: Not Currently  Other Topics Concern  . Not on file  Social History Narrative  . Not on file   Social Determinants of Health   Financial Resource Strain:   . Difficulty of Paying Living Expenses: Not on file  Food Insecurity:   . Worried About Charity fundraiser in the Last Year: Not on file  . Ran Out of Food in the Last Year: Not on file  Transportation Needs:   . Lack of Transportation (Medical): Not on file  . Lack of Transportation (Non-Medical): Not on file  Physical Activity:   . Days of Exercise per Week: Not on file  . Minutes of Exercise per Session: Not on file  Stress:   . Feeling of Stress : Not on file  Social Connections:   .  Frequency of Communication with Friends and Family: Not on file  . Frequency of Social Gatherings with Friends and Family: Not on file  . Attends Religious Services: Not on file  . Active Member of Clubs or Organizations: Not on file  . Attends Archivist Meetings: Not on file  . Marital Status: Not on file  Intimate Partner Violence:   . Fear of Current or Ex-Partner: Not on file  . Emotionally Abused: Not on file  . Physically Abused: Not on file  . Sexually Abused: Not on file     Review of Systems    General:  No chills, fever, night sweats or weight changes.  Cardiovascular:  No chest pain, dyspnea on exertion, edema, orthopnea, palpitations, paroxysmal nocturnal dyspnea. Dermatological: No rash, lesions/masses Respiratory: No cough, dyspnea Urologic: No hematuria, dysuria Abdominal:   No nausea, vomiting, diarrhea, bright red blood per rectum, melena, or hematemesis Neurologic:  No visual changes, wkns, changes in mental status. All other systems reviewed and are otherwise negative except as noted above.  Physical Exam    VS:  BP (!) 142/70   Pulse 78   Ht '5\' 4"'  (1.626 m)   Wt 147 lb (66.7 kg)   SpO2 96%   BMI 25.23 kg/m  , BMI Body mass index is 25.23 kg/m. GEN: Well nourished, well developed, in no acute distress. HEENT: normal. Neck: Supple, no JVD, carotid bruits, or masses. Cardiac: RRR, no murmurs, rubs, or gallops. No clubbing, cyanosis, edema.  Radials/DP/PT 2+ and equal bilaterally.  Respiratory:  Respirations regular and unlabored, clear to auscultation bilaterally. GI: Soft, nontender, nondistended, BS + x 4. MS: no deformity or atrophy. Skin: warm and dry, no rash. Neuro:  Strength and sensation are intact. Psych: Normal affect.  Accessory Clinical Findings    Recent Labs: 05/12/2020: B Natriuretic Peptide 1,352.0 05/13/2020: ALT 50 05/14/2020: Magnesium 2.2 05/19/2020: Hemoglobin 8.4; Platelets 343 05/21/2020: BUN 52; Creatinine, Ser  3.15; Potassium 4.8; Sodium 137   Recent Lipid Panel    Component Value Date/Time   CHOL 173 09/23/2015 0537   TRIG 96 09/23/2015 0537   HDL 36 (L) 09/23/2015 0537   CHOLHDL 4.8 09/23/2015 0537  VLDL 19 09/23/2015 0537   LDLCALC 118 (H) 09/23/2015 0537    ECG personally reviewed by me today- none today.  Echocardiogram 05/13/2020 IMPRESSIONS    1. Left ventricular ejection fraction, by estimation, is 40 to 45%. The  left ventricle has mildly decreased function. The left ventricle  demonstrates global hypokinesis. The left ventricular internal cavity size  was mildly dilated. There is mild left  ventricular hypertrophy. Left ventricular diastolic parameters are  consistent with Grade II diastolic dysfunction (pseudonormalization).  Elevated left atrial pressure.  2. Right ventricular systolic function is mildly reduced. The right  ventricular size is normal. There is moderately elevated pulmonary artery  systolic pressure. The estimated right ventricular systolic pressure is  47.6 mmHg.  3. The mitral valve is grossly normal. Mild to moderate mitral valve  regurgitation.  4. The aortic valve is tricuspid. Aortic valve regurgitation is not  visualized. Mild aortic valve sclerosis is present, with no evidence of  aortic valve stenosis.  5. The inferior vena cava is dilated in size with >50% respiratory  variability, suggesting right atrial pressure of 8 mmHg.   Assessment & Plan   1.  Acute on chronic combined systolic and diastolic CHF-euvolemic today.  Weight today 147 lbs.  Weight at discharge 152.78 pounds.  Not a candidate for ACE, ARB, Arni, or spironolactone due to renal failure.  Nephrology dosing diuretic. Continue metoprolol, furosemide Heart healthy low-sodium diet-salty 6 given Increase physical activity as tolerated Daily weights Contact office with weight increase of 3 pounds overnight or 5 pounds in 1 week. Lower extremity support stockings-discussed  the importance of wearing daily and taken off at night.   Blood pressure log given  Acute kidney injury-creatinine 3.15 at discharge.  Baseline around 2.91 Follows with nephrology  Hyperlipidemia-LDL 118 on 09/23/2015. Continue atorvastatin Heart healthy low-sodium high-fiber diet Increase physical activity as tolerated Followed by PCP  Physician: Follow-up with Dr. Harl Bowie in 3 months.  Jossie Ng. Xandrea Clarey NP-C    05/31/2020, 1:27 PM [facility] [address] Office [phone] Fax [phone]  Notice: This dictation was prepared with Dragon dictation along with smaller phrase technology. Any transcriptional errors that result from this process are unintentional and may not be corrected upon review.

## 2020-05-31 ENCOUNTER — Other Ambulatory Visit: Payer: Self-pay

## 2020-05-31 ENCOUNTER — Ambulatory Visit (INDEPENDENT_AMBULATORY_CARE_PROVIDER_SITE_OTHER): Payer: Medicare HMO | Admitting: General Practice

## 2020-05-31 ENCOUNTER — Encounter: Payer: Self-pay | Admitting: General Practice

## 2020-05-31 VITALS — BP 142/70 | HR 78 | Ht 64.0 in | Wt 147.0 lb

## 2020-05-31 DIAGNOSIS — I5043 Acute on chronic combined systolic (congestive) and diastolic (congestive) heart failure: Secondary | ICD-10-CM | POA: Diagnosis not present

## 2020-05-31 DIAGNOSIS — E782 Mixed hyperlipidemia: Secondary | ICD-10-CM | POA: Diagnosis not present

## 2020-05-31 DIAGNOSIS — N179 Acute kidney failure, unspecified: Secondary | ICD-10-CM

## 2020-05-31 NOTE — Patient Instructions (Signed)
Medication Instructions:  Your physician recommends that you continue on your current medications as directed. Please refer to the Current Medication list given to you today.  *If you need a refill on your cardiac medications before your next appointment, please call your pharmacy*   Lab Work: None today If you have labs (blood work) drawn today and your tests are completely normal, you will receive your results only by: Marland Kitchen MyChart Message (if you have MyChart) OR . A paper copy in the mail If you have any lab test that is abnormal or we need to change your treatment, we will call you to review the results.   Testing/Procedures: None today   Follow-Up: At [facility], you and your health needs are our priority.  As part of our continuing mission to provide you with exceptional heart care, we have created designated Provider Care Teams.  These Care Teams include your primary Cardiologist (physician) and Advanced Practice Providers (APPs -  Physician Assistants and Nurse Practitioners) who all work together to provide you with the care you need, when you need it.  We recommend signing up for the patient portal called "MyChart".  Sign up information is provided on this After Visit Summary.  MyChart is used to connect with patients for Virtual Visits (Telemedicine).  Patients are able to view lab/test results, encounter notes, upcoming appointments, etc.  Non-urgent messages can be sent to your provider as well.   To learn more about what you can do with MyChart, go to [email or web address].    Your next appointment:   3 month(s)  The format for your next appointment:   In Person  Provider:   Carlyle Dolly, MD   Other Instructions Weigh self daily.call us if you have 3 lbs or more weight gain in 24 hours, or 5 lbs in 1 week.   [phone]   Follow the salty Six sodium guidelines I have provided for you.

## 2020-06-02 ENCOUNTER — Other Ambulatory Visit: Payer: Self-pay | Admitting: "Endocrinology

## 2020-06-03 ENCOUNTER — Telehealth: Payer: Self-pay

## 2020-06-03 DIAGNOSIS — E1165 Type 2 diabetes mellitus with hyperglycemia: Secondary | ICD-10-CM

## 2020-06-03 DIAGNOSIS — IMO0002 Reserved for concepts with insufficient information to code with codable children: Secondary | ICD-10-CM

## 2020-06-03 MED ORDER — BD PEN NEEDLE SHORT U/F 31G X 8 MM MISC: 1.0000 | 3 refills | Status: DC

## 2020-06-03 NOTE — Telephone Encounter (Signed)
Pt's son stopped by to get a refill on her Insulin Pen Needle (B-D ULTRAFINE III SHORT PEN) 31G X 8 MM MISC. I had him make her an appt for Monday

## 2020-06-03 NOTE — Telephone Encounter (Signed)
Rx sent 

## 2020-06-07 ENCOUNTER — Ambulatory Visit (INDEPENDENT_AMBULATORY_CARE_PROVIDER_SITE_OTHER): Payer: Medicare HMO | Admitting: "Endocrinology

## 2020-06-07 ENCOUNTER — Encounter: Payer: Self-pay | Admitting: "Endocrinology

## 2020-06-07 ENCOUNTER — Other Ambulatory Visit: Payer: Self-pay

## 2020-06-07 VITALS — BP 130/66 | HR 60 | Ht 64.0 in | Wt 146.2 lb

## 2020-06-07 DIAGNOSIS — E1165 Type 2 diabetes mellitus with hyperglycemia: Secondary | ICD-10-CM | POA: Diagnosis not present

## 2020-06-07 DIAGNOSIS — IMO0002 Reserved for concepts with insufficient information to code with codable children: Secondary | ICD-10-CM

## 2020-06-07 DIAGNOSIS — E118 Type 2 diabetes mellitus with unspecified complications: Secondary | ICD-10-CM

## 2020-06-07 DIAGNOSIS — E782 Mixed hyperlipidemia: Secondary | ICD-10-CM | POA: Diagnosis not present

## 2020-06-07 DIAGNOSIS — I10 Essential (primary) hypertension: Secondary | ICD-10-CM | POA: Diagnosis not present

## 2020-06-07 MED ORDER — TRESIBA FLEXTOUCH 100 UNIT/ML ~~LOC~~ SOPN: 22.0000 [IU] | PEN_INJECTOR | Freq: Every day | SUBCUTANEOUS | 1 refills | Status: DC

## 2020-06-07 NOTE — Progress Notes (Signed)
06/07/2020  Endocrinology follow-up note  Subjective:    Patient ID: Maria Parrish, female    DOB: 01-Feb-1946, PCP Maria Schwalbe, MD   Past Medical History:  Diagnosis Date  . Assistance needed for mobility    walks with walker  . Confusion   . Dementia ([place])   . Diabetes mellitus without complication ([place])   . Generalized weakness   . Hypercalcemia   . Hyperlipidemia   . Hyperparathyroidism ([place])   . Hypertension   . Hypertension   . Moderate protein-calorie malnutrition ([place])   . Peripheral neuropathy   . UTI (lower urinary tract infection)    Past Surgical History:  Procedure Laterality Date  . COLONOSCOPY N/A 08/12/2015   Procedure: COLONOSCOPY;  Surgeon: Daneil Dolin, MD;  [facility];  Service: Endoscopy;  Laterality: N/A;  1245pm  . ESOPHAGOGASTRODUODENOSCOPY N/A 08/12/2015   Procedure: ESOPHAGOGASTRODUODENOSCOPY (EGD);  Surgeon: Daneil Dolin, MD;  Location: [facility];  Service: Endoscopy;  Laterality: N/A;  . none     Social History   Socioeconomic History  . Marital status: Widowed    Spouse name: Not on file  . Number of children: 2  . Years of education: Not on file  . Highest education level: Not on file  Occupational History  . 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 Smoking status: Never Smoker  . Smokeless tobacco: Never Used  Vaping Use  . Vaping Use: Never used  Substance and Sexual Activity  . Alcohol use: No  . Drug use: No  . Sexual activity: Not Currently  Other Topics Concern  . Not on file  Social History Narrative  . Not on file   Social Determinants of Health   Financial Resource Strain:   . Difficulty of Paying Living Expenses: Not on file  Food Insecurity:   . Worried About Charity fundraiser in the Last Year: Not on file  . Ran Out of Food in the Last Year: Not on file  Transportation Needs:   . Lack of Transportation (Medical): Not on file  . Lack of Transportation (Non-Medical): Not on file   Physical Activity:   . Days of Exercise per Week: Not on file  . Minutes of Exercise per Session: Not on file  Stress:   . Feeling of Stress : Not on file  Social Connections:   . Frequency of Communication with Friends and Family: Not on file  . Frequency of Social Gatherings with Friends and Family: Not on file  . Attends Religious Services: Not on file  . Active Member of Clubs or Organizations: Not on file  . Attends Archivist Meetings: Not on file  . Marital Status: Not on file   Outpatient Encounter Medications as of 06/07/2020  Medication Sig  . amLODipine (NORVASC) 5 MG tablet Take by mouth.  Marland Kitchen aspirin EC 81 MG tablet Take 81 mg by mouth daily.  . Blood Glucose Monitoring Suppl (ACCU-CHEK GUIDE ME) w/Device KIT 1 Piece by Does not apply route as directed.  . Cholecalciferol 25 MCG (1000 UT) capsule Take by mouth.  . Cyanocobalamin (VITAMIN B-12 PO) Take 1 tablet by mouth daily.  . Darbepoetin Alfa (ARANESP) 100 MCG/0.5ML SOSY injection Inject 0.5 mLs (100 mcg total) into the skin every Tuesday at 6 PM.  . ezetimibe (ZETIA) 10 MG tablet daily.  . FEROSUL 325 (65 Fe) MG tablet Take 325 mg by mouth 2 (two) times daily.  . furosemide (LASIX) 40  MG tablet Take 1 tablet (40 mg total) by mouth 2 (two) times daily.  Marland Kitchen gabapentin (NEURONTIN) 300 MG capsule Take 300 mg by mouth 2 (two) times daily.  Marland Kitchen glucose blood (ACCU-CHEK GUIDE) test strip Use as instructed 4 x daily. E11.65  . hydrALAZINE (APRESOLINE) 50 MG tablet Take 1 tablet (50 mg total) by mouth every 8 (eight) hours.  . insulin degludec (TRESIBA FLEXTOUCH) 100 UNIT/ML FlexTouch Pen Inject 22 Units into the skin at bedtime.  . Insulin Pen Needle (B-D ULTRAFINE III SHORT PEN) 31G X 8 MM MISC 1 each by Does not apply route as directed. Use as directed daily  . isosorbide mononitrate (IMDUR) 30 MG 24 hr tablet Take 1 tablet (30 mg total) by mouth daily.  . metoprolol succinate (TOPROL-XL) 25 MG 24 hr tablet Take 1  tablet (25 mg total) by mouth daily.  . polyethylene glycol (MIRALAX / GLYCOLAX) packet Take 17 g by mouth daily as needed for mild constipation.  . potassium chloride SA (KLOR-CON) 20 MEQ tablet Take 1 tablet (20 mEq total) by mouth daily.  . Vitamin D, Ergocalciferol, (DRISDOL) 50000 units CAPS capsule Take 1 capsule (50,000 Units total) by mouth every 14 (fourteen) days.  . [DISCONTINUED] insulin degludec (TRESIBA FLEXTOUCH) 100 UNIT/ML FlexTouch Pen Inject 18 Units into the skin daily.   No facility-administered encounter medications on file as of 06/07/2020.   ALLERGIES: Allergies  Allergen Reactions  . Penicillins Other (See Comments)    Makes patient sore.  Has patient had a PCN reaction causing immediate rash, facial/tongue/throat swelling, SOB or lightheadedness with hypotension: No Has patient had a PCN reaction causing severe rash involving mucus membranes or skin necrosis: No Has patient had a PCN reaction that required hospitalization No Has patient had a PCN reaction occurring within the last 10 years: No If all of the above answers are "NO", then may proceed with Cephalosporin use.   . Statins Other (See Comments)    Makes wrists and fingers ache   VACCINATION STATUS:  There is no immunization history on file for this patient.  Diabetes   74 year old female patient with medical history as above.  She was originally seen for secondary hyperparathyroidism with elevated PTH as a result of CKD.  More recent PTH is 127 associated with normal calcium of 9.5.  She has CKD stage  3-4, she is only on observation management for this.     -She is being seen in follow-up for management of her currently and chronically  uncontrolled, type 2 diabetes.   After reluctance for several months, she accepted low-dose basal insulin.  During her last visit, she was kept on Tresiba 22 units nightly, decreased it by herself to 18 units nightly.  Her previsit labs show A1c of 7.9%.  Her fasting  blood glucose average is  134-157. Her A1c is improving overall from >  14%.  Due to worsening CKD, she was taken off of Metformin during her recent hospitalization.  She has no new complaints today.  She feels better. - Her thyroid ultrasound shows mild goiter with no discrete nodules. - She has no new complaints.  Review of Systems Limited as above.   Objective:    BP 130/66   Pulse 60   Ht '5\' 4"'  (1.626 m)   Wt 146 lb 3.2 oz (66.3 kg)   BMI 25.10 kg/m   Wt Readings from Last 3 Encounters:  06/07/20 146 lb 3.2 oz (66.3 kg)  05/31/20 147 lb (66.7 kg)  05/21/20 152 lb 12.5 oz (69.3 kg)     CMP Latest Ref Rng & Units 05/21/2020 05/20/2020 05/19/2020  Glucose 70 - 99 mg/dL 100(H) 96 155(H)  BUN 8 - 23 mg/dL 52(H) 52(H) 47(H)  Creatinine 0.44 - 1.00 mg/dL 3.15(H) 3.27(H) 2.91(H)  Sodium 135 - 145 mmol/L 137 137 136  Potassium 3.5 - 5.1 mmol/L 4.8 4.9 4.8  Chloride 98 - 111 mmol/L 101 103 104  CO2 22 - 32 mmol/L '26 25 24  ' Calcium 8.9 - 10.3 mg/dL 9.9 9.5 9.4  Total Protein 6.5 - 8.1 g/dL - - -  Total Bilirubin 0.3 - 1.2 mg/dL - - -  Alkaline Phos 38 - 126 U/L - - -  AST 15 - 41 U/L - - -  ALT 0 - 44 U/L - - -     Diabetic Labs (most recent): Lab Results  Component Value Date   HGBA1C 7.9 (H) 05/12/2020   HGBA1C 8.7 (H) 07/16/2019   HGBA1C 8.3 (H) 02/14/2019   Lipid Panel     Component Value Date/Time   CHOL 173 09/23/2015 0537   TRIG 96 09/23/2015 0537   HDL 36 (L) 09/23/2015 0537   CHOLHDL 4.8 09/23/2015 0537   VLDL 19 09/23/2015 0537   LDLCALC 118 (H) 09/23/2015 0537   Results for GREYSON, RICCARDI (MRN [id number]) as of 06/07/2020 18:48  Ref. Range 08/10/2015 10:38 09/17/2015 06:24  TSH Latest Ref Range: 0.350 - 4.500 uIU/mL 1.547 1.951  T4,Free(Direct) Latest Ref Range: 0.80 - 1.80 ng/dL 0.99     Assessment & Plan:   1. Uncontrolled type 2 diabetes mellitus without complication, without long-term current use of insulin (HCC) -Her previsit A1c shows improvement  to 7.9% from >14%. -She has responded to low-dose basal insulin she reluctantly accepted during her last visit.   Her previsit labs show A1c of 7.9%.  Her fasting blood glucose average is  134-157.   -She is advised to increase her Tresiba to 22 units nightly, associated with monitoring of blood glucose twice a day-daily before breakfast and at bedtime.  She is advised to discontinue Metformin.  She is encouraged to call clinic for blood glucose less than 70 or  greater than  200 mg/dL.   - she  admits there is a room for improvement in her diet and drink choices. -  Suggestion is made for her to avoid simple carbohydrates  from her diet including Cakes, Sweet Desserts / Pastries, Ice Cream, Soda (diet and regular), Sweet Tea, Candies, Chips, Cookies, Sweet Pastries,  Store Bought Juices, Alcohol in Excess of  1-2 drinks a day, Artificial Sweeteners, Coffee Creamer, and "Sugar-free" Products. This will help patient to have stable blood glucose profile and potentially avoid unintended weight gain.   2. Hypercalcemia: Resolved  - Her repeat labs show normal calcium of 9.5, However her PTH remains high at 127, this combination is likely a result of CKD. - She is vitamin D replete now . -She will not need any intervention for at this time. - Her neck/thyroid ultrasound was unremarkable. -She will be considered for 24-hour urine calcium and creatinine measurement on subsequent visits.   3. Essential hypertension -she is advised to home monitor blood pressure and report if > 140/90 on 2 separate readings.  -  She is currently on lisinopril 30 mg p.o. daily at breakfast.  She has not taken her blood pressure medication this morning.   - She is hesitant to add any additional therapy, but willing to discuss  this with her PMD.   4. Vitamin D deficiency: See #1 above. -She is status post therapy with vitamin D, currently at 28 ng/dL.     5. Goiter -Her thyroid ultrasound showed mild goiter with  no discrete nodules. No intervention needed at this point.   - I advised patient to maintain close follow up with Maria Schwalbe, MD for primary care needs.     - Time spent on this patient care encounter:  20 minutes of which 50% was spent in  counseling and the rest reviewing  her current and  previous labs / studies and medications  doses and developing a plan for long term care. Maria Parrish  participated in the discussions, expressed understanding, and voiced agreement with the above plans.  All questions were answered to her satisfaction. she is encouraged to contact clinic should she have any questions or concerns prior to her return visit.    Follow up plan: Return in about 3 months (around 09/07/2020) for F/U with Pre-visit Labs, Meter, Logs, A1c here.Glade Lloyd, MD Phone: [phone]  Fax: [phone]  -  This note was partially dictated with voice recognition software. Similar sounding words can be transcribed inadequately or may not  be corrected upon review.  06/07/2020, 6:47 PM

## 2020-06-07 NOTE — Patient Instructions (Signed)

## 2020-08-10 LAB — COMPREHENSIVE METABOLIC PANEL
ALT: 30 IU/L (ref 0–32)
AST: 23 IU/L (ref 0–40)
Albumin/Globulin Ratio: 1.2 (ref 1.2–2.2)
Albumin: 3.7 g/dL (ref 3.7–4.7)
Alkaline Phosphatase: 110 IU/L (ref 44–121)
BUN/Creatinine Ratio: 20 (ref 12–28)
BUN: 55 mg/dL — ABNORMAL HIGH (ref 8–27)
Bilirubin Total: <0.2 mg/dL (ref 0.0–1.2)
CO2: 18 mmol/L — ABNORMAL LOW (ref 20–29)
Calcium: 9.4 mg/dL (ref 8.7–10.3)
Chloride: 105 mmol/L (ref 96–106)
Creatinine, Ser: 2.69 mg/dL — ABNORMAL HIGH (ref 0.57–1.00)
GFR calc Af Amer: 19 mL/min/{1.73_m2} — ABNORMAL LOW (ref >59)
GFR calc non Af Amer: 17 mL/min/{1.73_m2} — ABNORMAL LOW (ref >59)
Globulin, Total: 3.1 g/dL (ref 1.5–4.5)
Glucose: 159 mg/dL — ABNORMAL HIGH (ref 65–99)
Potassium: 4.6 mmol/L (ref 3.5–5.2)
Sodium: 138 mmol/L (ref 134–144)
Total Protein: 6.8 g/dL (ref 6.0–8.5)

## 2020-08-10 LAB — TSH: TSH: 3.1 u[IU]/mL (ref 0.450–4.500)

## 2020-08-10 LAB — T4, FREE: Free T4: 0.91 ng/dL (ref 0.82–1.77)

## 2020-08-20 ENCOUNTER — Encounter (HOSPITAL_COMMUNITY): Payer: Self-pay | Admitting: Hematology and Oncology

## 2020-08-20 ENCOUNTER — Inpatient Hospital Stay (HOSPITAL_COMMUNITY): Payer: Medicare HMO | Attending: Hematology and Oncology | Admitting: Hematology and Oncology

## 2020-08-20 ENCOUNTER — Other Ambulatory Visit: Payer: Self-pay

## 2020-08-20 VITALS — BP 154/62 | HR 73 | Temp 98.7°F | Resp 17 | Ht 64.0 in | Wt 156.4 lb

## 2020-08-20 DIAGNOSIS — Z794 Long term (current) use of insulin: Secondary | ICD-10-CM | POA: Diagnosis not present

## 2020-08-20 DIAGNOSIS — E1122 Type 2 diabetes mellitus with diabetic chronic kidney disease: Secondary | ICD-10-CM | POA: Diagnosis not present

## 2020-08-20 DIAGNOSIS — Z79899 Other long term (current) drug therapy: Secondary | ICD-10-CM | POA: Diagnosis not present

## 2020-08-20 DIAGNOSIS — D649 Anemia, unspecified: Secondary | ICD-10-CM | POA: Diagnosis not present

## 2020-08-20 DIAGNOSIS — D631 Anemia in chronic kidney disease: Secondary | ICD-10-CM | POA: Insufficient documentation

## 2020-08-20 DIAGNOSIS — I129 Hypertensive chronic kidney disease with stage 1 through stage 4 chronic kidney disease, or unspecified chronic kidney disease: Secondary | ICD-10-CM | POA: Insufficient documentation

## 2020-08-20 NOTE — Progress Notes (Signed)
Maria Parrish NOTE  Patient Care Team: Vidal Schwalbe, MD as PCP - General (Family Medicine) Harl Bowie Alphonse Guild, MD as PCP - Cardiology (Cardiology) Gala Romney Cristopher Estimable, MD as Consulting Physician (Gastroenterology)  CHIEF COMPLAINTS/PURPOSE OF CONSULTATION:  Anemia   ASSESSMENT & PLAN:  No problem-specific Assessment & Plan notes found for this encounter.  No orders of the defined types were placed in this encounter.  This is a very pleasant 75 year old female patient with type 2 diabetes mellitus, hypertension, dyslipidemia referred to hematology for evaluation of anemia.  She denies any symptoms except for some dizziness which she attributes to her high medications.  Physical examination, frail appearing elderly female female adenopathy mild lower extremity edema noted.  I reviewed her labs from outside which showed a hemoglobin of 11.2 on her most recent labs and a creatinine of 3.4, slightly elevated kappa lambda ratio likely related to the issues at 2.5. I do strongly this is likely anemia of chronic controlled hypertension reported in her records which has led to normocytic I do not see a serum protein electrophoresis done on her most recent labs.  I have ordered an SPEP, UPEP.  Patient and son do not want to get their blood work drawn today and wanted to this at their PCPs office.  Requested that they bring the results back with them for evaluation they expressed understanding Again this is less likely multiple myeloma but this needs to be ruled out given chronic kidney disease and ongoing anemia She is on darbopoetin already for CKD and anemia.  I would recommend holding this if Hb greater than 10 given increased risk of vascular events with Hemoglobin greater than 11. She will return to clinic in 2 weeks with the above-mentioned labs for follow-up.  She can follow-up with [place] on nephrology if this is indeed anemia of chronic kidney disease. Thank you for consulting [place]  in the care of this patient.  Please do not hesitate to contact us with any additional questions or concerns.    HISTORY OF PRESENTING ILLNESS:  Maria Parrish 75 y.o. female is here because of anemia.  This is a 75 year old female with history significant for hypertension anemia, peripheral neuropathy who is referred to hematology for evaluation of anemia Maria Parrish arrived to the appointment today with her son. She tells me that she has been feeling quite well, reports some dizziness which she attributes to her blood pressure medication.  She does think that she is taking too much medication.  She denies any chest pain, chest pressure, unusual fatigue, shortness of breath on exertion, hematochezia, melena, diarrhea that would explain the anemia.  She feels quite well and is in good health.  She denies any new bone pains.  Rest of the pertinent 10 point ROS as mentioned below are negative  REVIEW OF SYSTEMS:   Constitutional: Denies fevers, chills or abnormal night sweats Eyes: Denies blurriness of vision, double vision or watery eyes Ears, nose, mouth, throat, and face: Denies mucositis or sore throat Respiratory: Denies cough, dyspnea or wheezes Cardiovascular: Denies palpitation, chest discomfort or lower extremity swelling Gastrointestinal:  Denies nausea, heartburn or change in bowel habits Skin: Denies abnormal skin rashes Lymphatics: Denies new lymphadenopathy or easy bruising Neurological:Denies numbness, tingling or new weaknesses Behavioral/Psych: Mood is stable, no new changes  All other systems were reviewed with the patient and are negative.  MEDICAL HISTORY:  Past Medical History:  Diagnosis Date  . Assistance needed for mobility    walks  with walker  . Confusion   . Dementia ([place])   . Diabetes mellitus without complication ([place])   . Generalized weakness   . Hypercalcemia   . Hyperlipidemia   . Hyperparathyroidism ([place])   . Hypertension   . Hypertension   . Moderate  protein-calorie malnutrition ([place])   . Peripheral neuropathy   . UTI (lower urinary tract infection)     SURGICAL HISTORY: Past Surgical History:  Procedure Laterality Date  . COLONOSCOPY N/A 08/12/2015   Procedure: COLONOSCOPY;  Surgeon: Daneil Dolin, MD;  [facility];  Service: Endoscopy;  Laterality: N/A;  1245pm  . ESOPHAGOGASTRODUODENOSCOPY N/A 08/12/2015   Procedure: ESOPHAGOGASTRODUODENOSCOPY (EGD);  Surgeon: Daneil Dolin, MD;  [facility];  Service: Endoscopy;  Laterality: N/A;  . none      SOCIAL HISTORY: Social History   Socioeconomic History  . Marital status: Widowed    Spouse name: Not on file  . Number of children: 2  . Years of education: Not on file  . Highest education level: Not on file  Occupational History  . Occupation: Nutritional therapist for [employer]  . Smoking status: Never Smoker  . Smokeless tobacco: Never Used  Vaping Use  . Vaping Use: Never used  Substance and Sexual Activity  . Alcohol use: No  . Drug use: No  . Sexual activity: Not Currently  Other Topics Concern  . Not on file  Social History Narrative  . Not on file   Social Determinants of Health   Financial Resource Strain: Not on file  Food Insecurity: Not on file  Transportation Needs: No Transportation Needs  . Lack of Transportation (Medical): No  . Lack of Transportation (Non-Medical): No  Physical Activity: Inactive  . Days of Exercise per Week: 0 days  . Minutes of Exercise per Session: 0 min  Stress: Not on file  Social Connections: Not on file  Intimate Partner Violence: Not At Risk  . Fear of Current or Ex-Partner: No  . Emotionally Abused: No  . Physically Abused: No  . Sexually Abused: No    FAMILY HISTORY: Family History  Problem Relation Age of Onset  . Hypertension Brother   . Hypertension Brother   . Diabetes Brother   . Diabetes Father   . Heart attack Mother   . Colon cancer Neg Hx     ALLERGIES:  is allergic to  penicillins, statins, and statins support [acid blockers support].  MEDICATIONS:  Current Outpatient Medications  Medication Sig Dispense Refill  . Insulin Pen Needle (BD ULTRA-FINE PEN NEEDLES) 29G X 12.7MM MISC See admin instructions.    Marland Kitchen amLODipine (NORVASC) 5 MG tablet Take by mouth.    Marland Kitchen aspirin EC 81 MG tablet Take 81 mg by mouth daily.    . Blood Glucose Monitoring Suppl (ACCU-CHEK GUIDE ME) w/Device KIT 1 Piece by Does not apply route as directed. 1 kit 0  . Cholecalciferol 25 MCG (1000 UT) capsule Take by mouth.    . Cyanocobalamin (VITAMIN B-12 PO) Take 1 tablet by mouth daily.    . Darbepoetin Alfa (ARANESP) 100 MCG/0.5ML SOSY injection Inject 0.5 mLs (100 mcg total) into the skin every Tuesday at 6 PM. 4.2 mL   . ezetimibe (ZETIA) 10 MG tablet daily.  2  . FEROSUL 325 (65 Fe) MG tablet Take 325 mg by mouth 2 (two) times daily.    Marland Kitchen gabapentin (NEURONTIN) 300 MG capsule Take 300 mg by mouth 2 (two) times daily.    Marland Kitchen  glucose blood (ACCU-CHEK GUIDE) test strip Use as instructed 4 x daily. E11.65 150 each 5  . hydrALAZINE (APRESOLINE) 50 MG tablet Take 1 tablet (50 mg total) by mouth every 8 (eight) hours. 90 tablet 1  . insulin degludec (TRESIBA FLEXTOUCH) 100 UNIT/ML FlexTouch Pen Inject 22 Units into the skin at bedtime. 15 mL 1  . Insulin Pen Needle (B-D ULTRAFINE III SHORT PEN) 31G X 8 MM MISC 1 each by Does not apply route as directed. Use as directed daily 100 each 3  . isosorbide mononitrate (IMDUR) 30 MG 24 hr tablet Take 1 tablet (30 mg total) by mouth daily. 30 tablet 1  . metoprolol succinate (TOPROL-XL) 25 MG 24 hr tablet Take 1 tablet (25 mg total) by mouth daily. 30 tablet 1  . polyethylene glycol (MIRALAX / GLYCOLAX) packet Take 17 g by mouth daily as needed for mild constipation. 14 each 0  . Potassium Chloride ER 20 MEQ TBCR 1 tablet with food    . potassium chloride SA (KLOR-CON) 20 MEQ tablet Take 1 tablet (20 mEq total) by mouth daily. 30 tablet 1  . Vitamin D,  Ergocalciferol, (DRISDOL) 50000 units CAPS capsule Take 1 capsule (50,000 Units total) by mouth every 14 (fourteen) days. 16 capsule 0   No current facility-administered medications for this visit.     PHYSICAL EXAMINATION:  ECOG PERFORMANCE STATUS: 0 - Asymptomatic  Vitals:   08/20/20 1027  BP: (!) 154/62  Pulse: 73  Resp: 17  Temp: 98.7 F (37.1 C)  SpO2: 98%   Filed Weights   08/20/20 1027  Weight: 156 lb 7 oz (71 kg)    GENERAL:alert, no distress and comfortable SKIN: skin color, texture, turgor are normal, no rashes or significant lesions EYES: normal, conjunctiva are pink and non-injected, sclera clear OROPHARYNX:no exudate, no erythema and lips, buccal mucosa, and tongue normal  NECK: supple, thyroid normal size, non-tender, without nodularity LYMPH:  no palpable lymphadenopathy in the cervical, axillary  LUNGS: clear to auscultation and percussion with normal breathing effort HEART: regular rate & rhythm and no murmurs and no lower extremity edema ABDOMEN:abdomen soft, non-tender and normal bowel sounds Musculoskeletal:no cyanosis of digits and no clubbing , bilateral lower extremity 1+ edema. PSYCH: alert & oriented x 3 with fluent speech NEURO: no focal motor/sensory deficits  LABORATORY DATA:  I have reviewed the data as listed Lab Results  Component Value Date   WBC 5.9 05/19/2020   HGB 8.4 (L) 05/19/2020   HCT 26.5 (L) 05/19/2020   MCV 90.4 05/19/2020   PLT 343 05/19/2020     Chemistry      Component Value Date/Time   NA 138 08/09/2020 1416   K 4.6 08/09/2020 1416   CL 105 08/09/2020 1416   CO2 18 (L) 08/09/2020 1416   BUN 55 (H) 08/09/2020 1416   CREATININE 2.69 (H) 08/09/2020 1416   CREATININE 1.51 (H) 07/16/2019 1111      Component Value Date/Time   CALCIUM 9.4 08/09/2020 1416   CALCIUM 9.2 09/16/2015 0559   ALKPHOS 110 08/09/2020 1416   AST 23 08/09/2020 1416   ALT 30 08/09/2020 1416   BILITOT <0.2 08/09/2020 1416     I reviewed labs  from outside.  Most recent CBC from January 6 shows a hemoglobin of 11.2, normocytic normochromic without any leukopenia or leukocytosis or thrombocytopenia iron panel, G31 and folic acid levels from November 20 are unremarkable without any evidence of vitamin deficiency she had kappa lambda ratio back in November  which was elevated.  2.5, likely from chronic kidney disease.  Most recent CMP showed a creatinine of 3.4. last labs in November did not show any evidence of hemolysis.  RADIOGRAPHIC STUDIES: I have personally reviewed the radiological images as listed and agreed with the findings in the report. No results found.  All questions were answered. The patient knows to call the clinic with any problems, questions or concerns. I spent 45 minutes in the care of this patient including H and P, review of records, counseling and coordination of care.     Benay Pike, MD 08/20/2020 10:40 AM

## 2020-08-31 ENCOUNTER — Ambulatory Visit: Payer: Medicare HMO | Admitting: Student

## 2020-09-03 ENCOUNTER — Ambulatory Visit (HOSPITAL_COMMUNITY): Payer: Medicare HMO | Admitting: Hematology and Oncology

## 2020-09-07 ENCOUNTER — Encounter: Payer: Self-pay | Admitting: "Endocrinology

## 2020-09-07 ENCOUNTER — Other Ambulatory Visit: Payer: Self-pay

## 2020-09-07 ENCOUNTER — Ambulatory Visit: Payer: Medicare HMO | Admitting: Physician Assistant

## 2020-09-07 ENCOUNTER — Ambulatory Visit (INDEPENDENT_AMBULATORY_CARE_PROVIDER_SITE_OTHER): Payer: Medicare HMO | Admitting: "Endocrinology

## 2020-09-07 VITALS — BP 187/86 | HR 53 | Ht 64.0 in | Wt 151.8 lb

## 2020-09-07 DIAGNOSIS — E1165 Type 2 diabetes mellitus with hyperglycemia: Secondary | ICD-10-CM

## 2020-09-07 DIAGNOSIS — E782 Mixed hyperlipidemia: Secondary | ICD-10-CM | POA: Diagnosis not present

## 2020-09-07 DIAGNOSIS — I739 Peripheral vascular disease, unspecified: Secondary | ICD-10-CM | POA: Insufficient documentation

## 2020-09-07 DIAGNOSIS — I10 Essential (primary) hypertension: Secondary | ICD-10-CM

## 2020-09-07 DIAGNOSIS — IMO0002 Reserved for concepts with insufficient information to code with codable children: Secondary | ICD-10-CM

## 2020-09-07 DIAGNOSIS — E118 Type 2 diabetes mellitus with unspecified complications: Secondary | ICD-10-CM

## 2020-09-07 LAB — POCT GLYCOSYLATED HEMOGLOBIN (HGB A1C): HbA1c, POC (controlled diabetic range): 7.9 % — AB (ref 0.0–7.0)

## 2020-09-07 MED ORDER — TRESIBA FLEXTOUCH 100 UNIT/ML ~~LOC~~ SOPN
25.0000 [IU] | PEN_INJECTOR | Freq: Every day | SUBCUTANEOUS | 1 refills | Status: DC
Start: 2020-09-07 — End: 2021-06-30

## 2020-09-07 NOTE — Progress Notes (Signed)
09/07/2020  Endocrinology follow-up note  Subjective:    Patient ID: Maria Parrish, female    DOB: 1946-05-24, PCP Vidal Schwalbe, MD   Past Medical History:  Diagnosis Date  . Assistance needed for mobility    walks with walker  . Confusion   . Dementia ([place])   . Diabetes mellitus without complication ([place])   . Generalized weakness   . Hypercalcemia   . Hyperlipidemia   . Hyperparathyroidism ([place])   . Hypertension   . Hypertension   . Moderate protein-calorie malnutrition ([place])   . Peripheral neuropathy   . UTI (lower urinary tract infection)    Past Surgical History:  Procedure Laterality Date  . COLONOSCOPY N/A 08/12/2015   Procedure: COLONOSCOPY;  Surgeon: Daneil Dolin, MD;  Location: [facility];  Service: Endoscopy;  Laterality: N/A;  1245pm  . ESOPHAGOGASTRODUODENOSCOPY N/A 08/12/2015   Procedure: ESOPHAGOGASTRODUODENOSCOPY (EGD);  Surgeon: Daneil Dolin, MD;  Location: [facility];  Service: Endoscopy;  Laterality: N/A;  . none     Social History   Socioeconomic History  . Marital status: Widowed    Spouse name: Not on file  . Number of children: 2  . Years of education: Not on file  . Highest education level: Not on file  Occupational History  . Occupation: Nutritional therapist for [employer] Use  . Smoking status: Never Smoker  . Smokeless tobacco: Never Used  Vaping Use  . Vaping Use: Never used  Substance and Sexual Activity  . Alcohol use: No  . Drug use: No  . Sexual activity: Not Currently  Other Topics Concern  . Not on file  Social History Narrative  . Not on file   Social Determinants of Health   Financial Resource Strain: Not on file  Food Insecurity: Not on file  Transportation Needs: No Transportation Needs  . Lack of Transportation (Medical): No  . Lack of Transportation (Non-Medical): No  Physical Activity: Inactive  . Days of Exercise per Week: 0 days  . Minutes of Exercise per Session: 0 min  Stress: Not on file  Social  Connections: Not on file   Outpatient Encounter Medications as of 09/07/2020  Medication Sig  . amLODipine (NORVASC) 5 MG tablet Take by mouth.  Marland Kitchen aspirin EC 81 MG tablet Take 81 mg by mouth daily.  . Blood Glucose Monitoring Suppl (ACCU-CHEK GUIDE ME) w/Device KIT 1 Piece by Does not apply route as directed.  . Cholecalciferol 25 MCG (1000 UT) capsule Take by mouth.  . Cyanocobalamin (VITAMIN B-12 PO) Take 1 tablet by mouth daily.  . Darbepoetin Alfa (ARANESP) 100 MCG/0.5ML SOSY injection Inject 0.5 mLs (100 mcg total) into the skin every Tuesday at 6 PM.  . ezetimibe (ZETIA) 10 MG tablet daily.  . FEROSUL 325 (65 Fe) MG tablet Take 325 mg by mouth 2 (two) times daily.  Marland Kitchen gabapentin (NEURONTIN) 300 MG capsule Take 300 mg by mouth 2 (two) times daily.  Marland Kitchen glucose blood (ACCU-CHEK GUIDE) test strip Use as instructed 4 x daily. E11.65  . hydrALAZINE (APRESOLINE) 50 MG tablet Take 1 tablet (50 mg total) by mouth every 8 (eight) hours.  . insulin degludec (TRESIBA FLEXTOUCH) 100 UNIT/ML FlexTouch Pen Inject 25 Units into the skin at bedtime.  . Insulin Pen Needle (B-D ULTRAFINE III SHORT PEN) 31G X 8 MM MISC 1 each by Does not apply route as directed. Use as directed daily  . Insulin Pen Needle (BD ULTRA-FINE PEN NEEDLES) 29G X 12.7MM MISC  See admin instructions.  . isosorbide mononitrate (IMDUR) 30 MG 24 hr tablet Take 1 tablet (30 mg total) by mouth daily.  . metoprolol succinate (TOPROL-XL) 25 MG 24 hr tablet Take 1 tablet (25 mg total) by mouth daily.  . polyethylene glycol (MIRALAX / GLYCOLAX) packet Take 17 g by mouth daily as needed for mild constipation.  . Potassium Chloride ER 20 MEQ TBCR 1 tablet with food  . potassium chloride SA (KLOR-CON) 20 MEQ tablet Take 1 tablet (20 mEq total) by mouth daily. (Patient not taking: Reported on 09/07/2020)  . Vitamin D, Ergocalciferol, (DRISDOL) 50000 units CAPS capsule Take 1 capsule (50,000 Units total) by mouth every 14 (fourteen) days.  .  [DISCONTINUED] insulin degludec (TRESIBA FLEXTOUCH) 100 UNIT/ML FlexTouch Pen Inject 22 Units into the skin at bedtime.   No facility-administered encounter medications on file as of 09/07/2020.   ALLERGIES: Allergies  Allergen Reactions  . Penicillins Other (See Comments)    Makes patient sore.  Has patient had a PCN reaction causing immediate rash, facial/tongue/throat swelling, SOB or lightheadedness with hypotension: No Has patient had a PCN reaction causing severe rash involving mucus membranes or skin necrosis: No Has patient had a PCN reaction that required hospitalization No Has patient had a PCN reaction occurring within the last 10 years: No If all of the above answers are "NO", then may proceed with Cephalosporin use.   . Statins Other (See Comments)    Makes wrists and fingers ache Other reaction(s): Muscle Pain  . Statins Support [Acid Blockers Support]     Other reaction(s): joint pain   VACCINATION STATUS:  There is no immunization history on file for this patient.  Diabetes   -75 year old female patient with medical history as above.    She was originally seen for secondary hyperparathyroidism with elevated PTH as a result of CKD.  More recent labs show acceptable PTH range between 110-130, with calcium remaining normal at 9.4-10 mg per DL.  She is following with nephrology, for advancing CKD, she is only on observation management for this.     -She is being seen in follow-up for management of her currently and chronically  uncontrolled, type 2 diabetes.   After reluctance for several months, she accepted low-dose basal insulin.  During her last visit, she was kept on Tresiba 22 units nightly, she monitors blood glucose only of every other day averaging between 187-235 fasting, point-of-care A1c is 7.9%, overall improving from greater than 14%.   Due to worsening CKD, she was taken off of Metformin during her recent hospitalization.  She has no new complaints today.   She feels better. - Her thyroid ultrasound shows mild goiter with no discrete nodules. - She has no new complaints.  Review of Systems Limited as above.   Objective:    BP (!) 187/86   Pulse (!) 53   Ht '5\' 4"'  (1.626 m)   Wt 151 lb 12.8 oz (68.9 kg)   BMI 26.06 kg/m   Wt Readings from Last 3 Encounters:  09/07/20 151 lb 12.8 oz (68.9 kg)  08/20/20 156 lb 7 oz (71 kg)  06/07/20 146 lb 3.2 oz (66.3 kg)     CMP Latest Ref Rng & Units 08/09/2020 05/21/2020 05/20/2020  Glucose 65 - 99 mg/dL 159(H) 100(H) 96  BUN 8 - 27 mg/dL 55(H) 52(H) 52(H)  Creatinine 0.57 - 1.00 mg/dL 2.69(H) 3.15(H) 3.27(H)  Sodium 134 - 144 mmol/L 138 137 137  Potassium 3.5 - 5.2 mmol/L 4.6 4.8  4.9  Chloride 96 - 106 mmol/L 105 101 103  CO2 20 - 29 mmol/L 18(L) 26 25  Calcium 8.7 - 10.3 mg/dL 9.4 9.9 9.5  Total Protein 6.0 - 8.5 g/dL 6.8 - -  Total Bilirubin 0.0 - 1.2 mg/dL <0.2 - -  Alkaline Phos 44 - 121 IU/L 110 - -  AST 0 - 40 IU/L 23 - -  ALT 0 - 32 IU/L 30 - -     Diabetic Labs (most recent): Lab Results  Component Value Date   HGBA1C 7.9 (A) 09/07/2020   HGBA1C 7.9 (H) 05/12/2020   HGBA1C 8.7 (H) 07/16/2019   Lipid Panel     Component Value Date/Time   CHOL 173 09/23/2015 0537   TRIG 96 09/23/2015 0537   HDL 36 (L) 09/23/2015 0537   CHOLHDL 4.8 09/23/2015 0537   VLDL 19 09/23/2015 0537   LDLCALC 118 (H) 09/23/2015 0537      Assessment & Plan:   1. Uncontrolled type 2 diabetes mellitus without complication, without long-term current use of insulin (HCC) -Her point-of-care A1c 7.9%, unchanged from last visit although  Improvement overall from>14%. -She has responded to low-dose basal insulin she reluctantly accepted during her last visit.   Her previsit labs show A1c of 7.9%.  Her fasting blood glucose average is  187-235.   -She is advised to increase her Tresiba to 25 units nightly,  associated with monitoring of blood glucose twice a day-daily before breakfast and at bedtime.   She is advised to discontinue Metformin.  She is encouraged to call clinic for blood glucose less than 70 or  greater than  200 mg/dL.   -She does not have a safe oral regimen to manage diabetes. - she acknowledges that there is a room for improvement in her food and drink choices. - Suggestion is made for her to avoid simple carbohydrates  from her diet including Cakes, Sweet Desserts, Ice Cream, Soda (diet and regular), Sweet Tea, Candies, Chips, Cookies, Store Bought Juices, Alcohol in Excess of  1-2 drinks a day, Artificial Sweeteners,  Coffee Creamer, and "Sugar-free" Products, Lemonade. This will help patient to have more stable blood glucose profile and potentially avoid unintended weight gain.   2. Hypercalcemia: Resolved  - Her repeat labs show normal calcium of 9.4, recent PTH remaining stable between 110-127.  this combination is likely a result of CKD. - She is vitamin D replete now . -She will not need any intervention for at this time. - Her neck/thyroid ultrasound was unremarkable. -She will be considered for 24-hour urine calcium and creatinine measurement on subsequent visits. -She is encouraged to stay close to her nephrologist.   3. Essential hypertension  Her blood pressure is not controlled to target. -  She is currently on lisinopril 30 mg p.o. daily at breakfast.  She has not taken her blood pressure medication this morning.   - She is hesitant to add any additional therapy, but willing to discuss this with her PMD.   4. Vitamin D deficiency: See #1 above. -She is status post therapy with vitamin D, currently at 28 ng/dL.     5. Goiter -Her thyroid ultrasound showed mild goiter with no discrete nodules. No intervention needed at this point.   6) peripheral arterial disease on screening ABI -Patient will benefit from more directed assessment, will be referred to vascular surgery.  POCT ABI Results 09/07/20  Her ABI is abnormal today Right ABI: PAD      left  ABI:  PAD Arm systolic / diastolic: 316/74 mmHG -She will be sent to vascular surgery for better assessment.  - I advised patient to maintain close follow up with Vidal Schwalbe, MD for primary care needs.     - Time spent on this patient care encounter:  35 minutes of which 50% was spent in  counseling and the rest reviewing  her current and  previous labs / studies and medications  doses and developing a plan for long term care, and documenting this care. Maria Parrish  participated in the discussions, expressed understanding, and voiced agreement with the above plans.  All questions were answered to her satisfaction. she is encouraged to contact clinic should she have any questions or concerns prior to her return visit.   Follow up plan: Return in about 3 months (around 12/08/2020) for Bring Meter and Logs- A1c in Office.  Glade Lloyd, MD Phone: [phone]  Fax: [phone]  -  This note was partially dictated with voice recognition software. Similar sounding words can be transcribed inadequately or may not  be corrected upon review.  09/07/2020, 3:20 PM

## 2020-09-07 NOTE — Patient Instructions (Signed)

## 2020-09-14 NOTE — Progress Notes (Signed)
Cardiology Office Note:    Date:  09/15/2020   ID:  Maria Parrish, DOB 1946-05-05, MRN [id number]   PCP:  Vidal Schwalbe, [place]  Cardiologist:  Carlyle Dolly, MD   Advanced Practice Provider:  None  Electrophysiologist:  None       Referring MD: Vidal Schwalbe, MD   Chief Complaint:  Follow-up (CHF)    Patient Profile:    Maria Parrish is a 75 y.o. female with:   HFmrEF (heart failure with mildly reduced ejection fraction)    Hypertension   Diabetes mellitus   Hyperlipidemia   Hyperparathyroidism   Dementia   Chronic kidney disease   Anemia of chronic disease   Prior CV studies: Echocardiogram 05/13/20 EF 40-45, global HK, mild LVH, Gr 2 DD, mildly reduced RVSF, RVSP 50, mild to mod MR, AV sclerosis w/o AS  Echocardiogram 09/2015 EF 55-60  History of Present Illness:    [name] was last seen in clinic by Coletta Memos, NP in 05/2020 after a recent admission with decompensated HF.  She returns for f/u.  She is here with her son.  She has overall been doing well without chest pain, significant shortness of breath, syncope, orthopnea, paroxysmal nocturnal dyspnea, leg edema.  Her weights have been stable.  She does get lightheaded sometimes.        Past Medical History:  Diagnosis Date  . Assistance needed for mobility    walks with walker  . Confusion   . Dementia ([place])   . Diabetes mellitus without complication ([place])   . Generalized weakness   . Hypercalcemia   . Hyperlipidemia   . Hyperparathyroidism ([place])   . Hypertension   . Hypertension   . Moderate protein-calorie malnutrition ([place])   . Peripheral neuropathy   . UTI (lower urinary tract infection)     Current Medications: Current Meds  Medication Sig  . amLODipine (NORVASC) 5 MG tablet Take by mouth.  Marland Kitchen aspirin EC 81 MG tablet Take 81 mg by mouth daily.  . Cholecalciferol 25 MCG (1000 UT) capsule Take by mouth.  . Cyanocobalamin (VITAMIN B-12 PO) Take 1  tablet by mouth daily.  Marland Kitchen ezetimibe (ZETIA) 10 MG tablet daily.  . FEROSUL 325 (65 Fe) MG tablet Take 325 mg by mouth 2 (two) times daily.  Marland Kitchen gabapentin (NEURONTIN) 300 MG capsule Take 300 mg by mouth 2 (two) times daily.  . insulin degludec (TRESIBA FLEXTOUCH) 100 UNIT/ML FlexTouch Pen Inject 25 Units into the skin at bedtime.  . isosorbide mononitrate (IMDUR) 30 MG 24 hr tablet Take 1 tablet (30 mg total) by mouth daily.  . metoprolol tartrate (LOPRESSOR) 25 MG tablet Take 0.5 tablets (12.5 mg total) by mouth 2 (two) times daily.  . polyethylene glycol (MIRALAX / GLYCOLAX) packet Take 17 g by mouth daily as needed for mild constipation.  . [DISCONTINUED] Blood Glucose Monitoring Suppl (ACCU-CHEK GUIDE ME) w/Device KIT 1 Piece by Does not apply route as directed.  . [DISCONTINUED] Darbepoetin Alfa (ARANESP) 100 MCG/0.5ML SOSY injection Inject 0.5 mLs (100 mcg total) into the skin every Tuesday at 6 PM.  . [DISCONTINUED] glucose blood (ACCU-CHEK GUIDE) test strip Use as instructed 4 x daily. E11.65  . [DISCONTINUED] hydrALAZINE (APRESOLINE) 50 MG tablet Take 1 tablet (50 mg total) by mouth every 8 (eight) hours.  . [DISCONTINUED] Insulin Pen Needle (B-D ULTRAFINE III SHORT PEN) 31G X 8 MM MISC 1 each by Does not apply route as directed. Use  as directed daily  . [DISCONTINUED] Insulin Pen Needle (BD ULTRA-FINE PEN NEEDLES) 29G X 12.7MM MISC See admin instructions.  . [DISCONTINUED] metoprolol succinate (TOPROL-XL) 25 MG 24 hr tablet Take 1 tablet (25 mg total) by mouth daily.  . [DISCONTINUED] Potassium Chloride ER 20 MEQ TBCR 1 tablet with food  . [DISCONTINUED] potassium chloride SA (KLOR-CON) 20 MEQ tablet Take 1 tablet (20 mEq total) by mouth daily.  . [DISCONTINUED] Vitamin D, Ergocalciferol, (DRISDOL) 50000 units CAPS capsule Take 1 capsule (50,000 Units total) by mouth every 14 (fourteen) days.     Allergies:   Penicillins, Statins, and Statins support [acid blockers support]   Social  History   Tobacco Use  . Smoking status: Never Smoker  . Smokeless tobacco: Never Used  Vaping Use  . Vaping Use: Never used  Substance Use Topics  . Alcohol use: No  . Drug use: No     Family Hx: The patient's family history includes Diabetes in her brother and father; Heart attack in her mother; Hypertension in her brother and brother. There is no history of Colon cancer.  ROS   EKGs/Labs/Other Test Reviewed:    EKG:  EKG is   ordered today.  The ekg ordered today demonstrates sinus brady, HR 49, normal axis, TW inversions V5-6, QTc 408  Recent Labs: 05/12/2020: B Natriuretic Peptide 1,352.0 05/14/2020: Magnesium 2.2 05/19/2020: Hemoglobin 8.4; Platelets 343 08/09/2020: ALT 30; BUN 55; Creatinine, Ser 2.69; Potassium 4.6; Sodium 138; TSH 3.100   Recent Lipid Panel Lab Results  Component Value Date/Time   CHOL 173 09/23/2015 05:37 AM   TRIG 96 09/23/2015 05:37 AM   HDL 36 (L) 09/23/2015 05:37 AM   CHOLHDL 4.8 09/23/2015 05:37 AM   LDLCALC 118 (H) 09/23/2015 05:37 AM      Risk Assessment/Calculations:      Physical Exam:    VS:  BP (!) 158/92   Ht '5\' 4"'  (1.626 m)   Wt 154 lb (69.9 kg)   BMI 26.43 kg/m     Wt Readings from Last 3 Encounters:  09/15/20 154 lb (69.9 kg)  09/07/20 151 lb 12.8 oz (68.9 kg)  08/20/20 156 lb 7 oz (71 kg)     Constitutional:      Appearance: Healthy appearance. Not in distress.  Neck:     Vascular: No JVR.  Pulmonary:     Effort: Pulmonary effort is normal.     Breath sounds: No wheezing. No rales.  Cardiovascular:     Normal rate. Regular rhythm. Normal S1. Normal S2.     Murmurs: There is no murmur.  Edema:    Pretibial: bilateral trace edema of the pretibial area. Abdominal:     Palpations: Abdomen is soft.  Skin:    General: Skin is warm and dry.  Neurological:     Mental Status: Alert and oriented to person, place and time.     Cranial Nerves: Cranial nerves are intact.       ASSESSMENT & PLAN:    1. HFmrEF  (heart failure with mildly reduced EF) EF 40-45 by echocardiogram 10/21.  Overall, her volume status seems to be stable.  NYHA IIb.  She is not a candidate for ACE/ARB/ARNI/MRA secondary to her renal function.  Continue current dose of hydralazine, isosorbide.  She does have significant bradycardia.  I will reduce her metoprolol tartrate to 12.5 mg twice daily.  2. Stage 3b chronic kidney disease ([place]) She is followed by nephrology.  Most recent creatinine 2.69.  3. Essential  hypertension Her blood pressure is elevated today.  She has been out of her hydralazine for a month.  I will refill her hydralazine.  Reduce metoprolol as noted.  Continue current dose of amlodipine, isosorbide.  Follow-up in 4 weeks.  If blood pressure uncontrolled, adjust dose of hydralazine.  4. Bradycardia As noted, metoprolol tartrate will be reduced.  Follow-up in 4 weeks with an EKG.  If she continues to have significant bradycardia, we will need to stop her metoprolol tartrate.     Dispo:  Return in about 4 weeks (around 10/13/2020) for Ermalinda Barrios, [place] or [place], [place].   Medication Adjustments/Labs and Tests Ordered: Current medicines are reviewed at length with the patient today.  Concerns regarding medicines are outlined above.  Tests Ordered: Orders Placed This Encounter  Procedures  . EKG 12-Lead   Medication Changes: Meds ordered this encounter  Medications  . hydrALAZINE (APRESOLINE) 50 MG tablet    Sig: Take 1 tablet (50 mg total) by mouth every 8 (eight) hours.    Dispense:  270 tablet    Refill:  2  . metoprolol tartrate (LOPRESSOR) 25 MG tablet    Sig: Take 0.5 tablets (12.5 mg total) by mouth 2 (two) times daily.    Dispense:  90 tablet    Refill:  3    Signed, Richardson Dopp, PA-C  09/15/2020 5:14 PM    [facility] [place], [place], [place]  [address] Phone: [phone]; Fax: [phone]

## 2020-09-15 ENCOUNTER — Encounter: Payer: Self-pay | Admitting: Physician Assistant

## 2020-09-15 ENCOUNTER — Other Ambulatory Visit: Payer: Self-pay

## 2020-09-15 ENCOUNTER — Ambulatory Visit (INDEPENDENT_AMBULATORY_CARE_PROVIDER_SITE_OTHER): Payer: Medicare HMO | Admitting: Physician Assistant

## 2020-09-15 VITALS — BP 158/92 | Ht 64.0 in | Wt 154.0 lb

## 2020-09-15 DIAGNOSIS — R001 Bradycardia, unspecified: Secondary | ICD-10-CM

## 2020-09-15 DIAGNOSIS — N1832 Chronic kidney disease, stage 3b: Secondary | ICD-10-CM | POA: Diagnosis not present

## 2020-09-15 DIAGNOSIS — I10 Essential (primary) hypertension: Secondary | ICD-10-CM | POA: Diagnosis not present

## 2020-09-15 DIAGNOSIS — E782 Mixed hyperlipidemia: Secondary | ICD-10-CM

## 2020-09-15 DIAGNOSIS — I5020 Unspecified systolic (congestive) heart failure: Secondary | ICD-10-CM

## 2020-09-15 DIAGNOSIS — E1165 Type 2 diabetes mellitus with hyperglycemia: Secondary | ICD-10-CM

## 2020-09-15 MED ORDER — METOPROLOL TARTRATE 25 MG PO TABS: 12.5000 mg | ORAL_TABLET | Freq: Two times a day (BID) | ORAL | 3 refills | Status: DC

## 2020-09-15 MED ORDER — HYDRALAZINE HCL 50 MG PO TABS: 50.0000 mg | ORAL_TABLET | Freq: Three times a day (TID) | ORAL | 2 refills | Status: DC

## 2020-09-15 NOTE — Patient Instructions (Signed)
Medication Instructions:  Your physician has recommended you make the following change in your medication: 1. Decrease metoprolol one half  tablet by mouth ( 12.5 mg) twice daily.  2.  Hydralazine was refilled today # 90 to requested pharmacy.   *If you need a refill on your cardiac medications before your next appointment, please call your pharmacy*   Lab Work: -None  If you have labs (blood work) drawn today and your tests are completely normal, you will receive your results only by: Marland Kitchen MyChart Message (if you have MyChart) OR . A paper copy in the mail If you have any lab test that is abnormal or we need to change your treatment, we will call you to review the results.   Testing/Procedures: -None   Follow-Up: At [facility], you and your health needs are our priority.  As part of our continuing mission to provide you with exceptional heart care, we have created designated Provider Care Teams.  These Care Teams include your primary Cardiologist (physician) and Advanced Practice Providers (APPs -  Physician Assistants and Nurse Practitioners) who all work together to provide you with the care you need, when you need it.  We recommend signing up for the patient portal called "MyChart".  Sign up information is provided on this After Visit Summary.  MyChart is used to connect with patients for Virtual Visits (Telemedicine).  Patients are able to view lab/test results, encounter notes, upcoming appointments, etc.  Non-urgent messages can be sent to your provider as well.   To learn more about what you can do with MyChart, go to [email or web address].    Your next appointment:   4 week(s)  The format for your next appointment:   In Person  Provider:   You may see Carlyle Dolly, MD or one of the following Advanced Practice Providers on your designated Care Team:    Bernerd Pho, PA-C   Ermalinda Barrios, PA-C     Other Instructions -None

## 2020-09-16 ENCOUNTER — Other Ambulatory Visit (HOSPITAL_COMMUNITY)
Admission: RE | Admit: 2020-09-16 | Discharge: 2020-09-16 | Disposition: A | Discharge status: Home / Self Care | Payer: Medicare HMO | Source: Ambulatory Visit | Attending: Nephrology | Admitting: Nephrology

## 2020-09-16 ENCOUNTER — Other Ambulatory Visit: Payer: Self-pay

## 2020-09-16 DIAGNOSIS — D638 Anemia in other chronic diseases classified elsewhere: Secondary | ICD-10-CM | POA: Insufficient documentation

## 2020-09-16 DIAGNOSIS — N185 Chronic kidney disease, stage 5: Secondary | ICD-10-CM | POA: Diagnosis present

## 2020-09-16 DIAGNOSIS — E211 Secondary hyperparathyroidism, not elsewhere classified: Secondary | ICD-10-CM | POA: Diagnosis present

## 2020-09-16 DIAGNOSIS — E1122 Type 2 diabetes mellitus with diabetic chronic kidney disease: Secondary | ICD-10-CM | POA: Insufficient documentation

## 2020-09-16 DIAGNOSIS — R808 Other proteinuria: Secondary | ICD-10-CM | POA: Insufficient documentation

## 2020-09-16 LAB — RENAL FUNCTION PANEL
Albumin: 3.1 g/dL — ABNORMAL LOW (ref 3.5–5.0)
Anion gap: 11 (ref 5–15)
BUN: 63 mg/dL — ABNORMAL HIGH (ref 8–23)
CO2: 26 mmol/L (ref 22–32)
Calcium: 9.3 mg/dL (ref 8.9–10.3)
Chloride: 100 mmol/L (ref 98–111)
Creatinine, Ser: 2.88 mg/dL — ABNORMAL HIGH (ref 0.44–1.00)
GFR, Estimated: 17 mL/min — ABNORMAL LOW (ref >60)
Glucose, Bld: 233 mg/dL — ABNORMAL HIGH (ref 70–99)
Phosphorus: 4.1 mg/dL (ref 2.5–4.6)
Potassium: 3.6 mmol/L (ref 3.5–5.1)
Sodium: 137 mmol/L (ref 135–145)

## 2020-09-16 LAB — PROTEIN / CREATININE RATIO, URINE
Creatinine, Urine: 71.66 mg/dL
Protein Creatinine Ratio: 2.07 mg/mg{Cre} — ABNORMAL HIGH (ref 0.00–0.15)
Total Protein, Urine: 148 mg/dL

## 2020-09-16 LAB — CBC
HCT: 27.7 % — ABNORMAL LOW (ref 36.0–46.0)
Hemoglobin: 9.3 g/dL — ABNORMAL LOW (ref 12.0–15.0)
MCH: 28.9 pg (ref 26.0–34.0)
MCHC: 33.6 g/dL (ref 30.0–36.0)
MCV: 86 fL (ref 80.0–100.0)
Platelets: 300 10*3/uL (ref 150–400)
RBC: 3.22 MIL/uL — ABNORMAL LOW (ref 3.87–5.11)
RDW: 14 % (ref 11.5–15.5)
WBC: 7.6 10*3/uL (ref 4.0–10.5)
nRBC: 0 % (ref 0.0–0.2)

## 2020-09-20 ENCOUNTER — Ambulatory Visit (HOSPITAL_COMMUNITY): Payer: Medicare HMO | Admitting: Hematology

## 2020-10-05 NOTE — Progress Notes (Signed)
Cardiology Office Note    Date:  10/12/2020   ID:  Maria Parrish, DOB 1946-07-13, MRN [id number]   PCP:  Vidal Schwalbe, [place]  Cardiologist:  Carlyle Dolly, MD  Advanced Practice Provider:  No care team member to display Electrophysiologist:  None   [phone]   Chief Complaint  Patient presents with  . Follow-up    History of Present Illness:  Maria Parrish is a 75 y.o. female with history of CHF with mildly reduced EF 40 to 45% with grade 2 DD on echo 05/13/2020, hypertension, HLD, dementia, CKD  Maria Dopp, PA-C 09/15/2020 at which time she was bradycardic and he reduced her metoprolol to 12.5 mg twice daily.  Her blood pressure was elevated but she was out of hydralazine for month which he refilled.  Patient comes in accompanied by her son. She is feeling fine. BP running 135/71 at home but she's not checking it daily. Eating a lot of frozen dinners. Denies dizziness or palpitations or chest pain.  Past Medical History:  Diagnosis Date  . Assistance needed for mobility    walks with walker  . Confusion   . Dementia ([place])   . Diabetes mellitus without complication ([place])   . Generalized weakness   . Hypercalcemia   . Hyperlipidemia   . Hyperparathyroidism ([place])   . Hypertension   . Hypertension   . Moderate protein-calorie malnutrition ([place])   . Peripheral neuropathy   . UTI (lower urinary tract infection)     Past Surgical History:  Procedure Laterality Date  . COLONOSCOPY N/A 08/12/2015   Procedure: COLONOSCOPY;  Surgeon: Daneil Dolin, MD;  Location: [facility];  Service: Endoscopy;  Laterality: N/A;  1245pm  . ESOPHAGOGASTRODUODENOSCOPY N/A 08/12/2015   Procedure: ESOPHAGOGASTRODUODENOSCOPY (EGD);  Surgeon: Daneil Dolin, MD;  Location: [facility];  Service: Endoscopy;  Laterality: N/A;  . none      Current Medications: Current Meds  Medication Sig  . amLODipine (NORVASC) 5 MG tablet Take by mouth daily.  Marland Kitchen  aspirin EC 81 MG tablet Take 81 mg by mouth daily.  . calcitRIOL (ROCALTROL) 0.25 MCG capsule Take 0.25 mcg by mouth 3 (three) times a week.  . Cyanocobalamin (VITAMIN B-12 PO) Take 1 tablet by mouth daily.  Marland Kitchen ezetimibe (ZETIA) 10 MG tablet daily.  . FEROSUL 325 (65 Fe) MG tablet Take 325 mg by mouth 2 (two) times daily.  . furosemide (LASIX) 80 MG tablet Take 80 mg by mouth 2 (two) times daily.  Marland Kitchen gabapentin (NEURONTIN) 300 MG capsule Take 300 mg by mouth 2 (two) times daily.  . hydrALAZINE (APRESOLINE) 50 MG tablet Take 1 tablet (50 mg total) by mouth every 8 (eight) hours.  . insulin degludec (TRESIBA FLEXTOUCH) 100 UNIT/ML FlexTouch Pen Inject 25 Units into the skin at bedtime.  . isosorbide mononitrate (IMDUR) 30 MG 24 hr tablet Take 1 tablet (30 mg total) by mouth daily.  . metoprolol tartrate (LOPRESSOR) 25 MG tablet Take 0.5 tablets (12.5 mg total) by mouth 2 (two) times daily.  . [DISCONTINUED] polyethylene glycol (MIRALAX / GLYCOLAX) packet Take 17 g by mouth daily as needed for mild constipation.     Allergies:   Penicillins, Statins, and Statins support [acid blockers support]   Social History   Socioeconomic History  . Marital status: Widowed    Spouse name: Not on file  . Number of children: 2  . Years of education: Not on file  .  Highest education level: Not on file  Occupational History  . 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 Smoking status: Never Smoker  . Smokeless tobacco: Never Used  Vaping Use  . Vaping Use: Never used  Substance and Sexual Activity  . Alcohol use: No  . Drug use: No  . Sexual activity: Not Currently  Other Topics Concern  . Not on file  Social History Narrative  . Not on file   Social Determinants of Health   Financial Resource Strain: Not on file  Food Insecurity: Not on file  Transportation Needs: No Transportation Needs  . Lack of Transportation (Medical): No  . Lack of Transportation (Non-Medical): No  Physical  Activity: Inactive  . Days of Exercise per Week: 0 days  . Minutes of Exercise per Session: 0 min  Stress: Not on file  Social Connections: Not on file     Family History:  The patient's family history includes Diabetes in her brother and father; Heart attack in her mother; Hypertension in her brother and brother.   ROS:   Please see the history of present illness.    ROS All other systems reviewed and are negative.   PHYSICAL EXAM:   VS:  BP (!) 164/80   Pulse (!) 58   Ht '5\' 4"'$  (1.626 m)   Wt 157 lb (71.2 kg)   SpO2 98%   BMI 26.95 kg/m   Physical Exam  GEN: Well nourished, well developed, in no acute distress  Neck: no JVD, carotid bruits, or masses Cardiac:RRR; 2/6 systolic murmur the left sternal border Respiratory:  clear to auscultation bilaterally, normal work of breathing GI: soft, nontender, nondistended, + BS Ext: without cyanosis, clubbing, or edema, Good distal pulses bilaterally Neuro:  Alert and Oriented x 3 Psych: euthymic mood, full affect  Wt Readings from Last 3 Encounters:  10/12/20 157 lb (71.2 kg)  09/15/20 154 lb (69.9 kg)  09/07/20 151 lb 12.8 oz (68.9 kg)      Studies/Labs Reviewed:   EKG:  EKG is  ordered today.  The ekg ordered today demonstrates sinus bradycardia at 55 bpm poor R wave progression anteriorly, heart rate improved from last office visit  Recent Labs: 05/12/2020: B Natriuretic Peptide 1,352.0 05/14/2020: Magnesium 2.2 08/09/2020: ALT 30; TSH 3.100 09/16/2020: BUN 63; Creatinine, Ser 2.88; Hemoglobin 9.3; Platelets 300; Potassium 3.6; Sodium 137   Lipid Panel    Component Value Date/Time   CHOL 173 09/23/2015 0537   TRIG 96 09/23/2015 0537   HDL 36 (L) 09/23/2015 0537   CHOLHDL 4.8 09/23/2015 0537   VLDL 19 09/23/2015 0537   LDLCALC 118 (H) 09/23/2015 0537    Additional studies/ records that were reviewed today include:  2D echo 05/13/2020  IMPRESSIONS     1. Left ventricular ejection fraction, by estimation, is 40  to 45%. The  left ventricle has mildly decreased function. The left ventricle  demonstrates global hypokinesis. The left ventricular internal cavity size  was mildly dilated. There is mild left  ventricular hypertrophy. Left ventricular diastolic parameters are  consistent with Grade II diastolic dysfunction (pseudonormalization).  Elevated left atrial pressure.   2. Right ventricular systolic function is mildly reduced. The right  ventricular size is normal. There is moderately elevated pulmonary artery  systolic pressure. The estimated right ventricular systolic pressure is  [id number] mmHg.   3. The mitral valve is grossly normal. Mild to moderate mitral valve  regurgitation.   4. The aortic valve is tricuspid. Aortic valve  regurgitation is not  visualized. Mild aortic valve sclerosis is present, with no evidence of  aortic valve stenosis.   5. The inferior vena cava is dilated in size with >50% respiratory  variability, suggesting right atrial pressure of 8 mmHg.     Risk Assessment/Calculations:         ASSESSMENT:    1. Chronic combined systolic and diastolic CHF (congestive heart failure) ([place])   2. Essential hypertension   3. Stage 3b chronic kidney disease ([place])   4. Bradycardia      PLAN:  In order of problems listed above:  Heart failure with mildly reduced EF ejection fraction 40 to 45% on echo 04/2020 not a candidate for ACE/ARB/ARN I/MRA secondary to renal function.  Maintained on hydralazine and isosorbide-no heart failure on exam  Hypertension blood pressure was elevated last office visit but she had been out of her hydralazine for a month.  This was restarted.  Patient's blood pressure is elevated today but was normal yesterday at home.  She is not taking it regularly.  She is eating fast food, canned meats, frozen dinners.  Discussed trying to reduce sodium in her diet and give her 2 g sodium diet to follow.  She will keep track of her blood pressures daily for 2  weeks and call us with results.  She will see [place] back in 3 to 4 weeks.  CKD stage III creatinine 2.88 09/16/2020  Sinus bradycardia metoprolol reduced last office visit heart rate up to 55 today.  Patient is asymptomatic.  We will continue low-dose metoprolol.  Shared Decision Making/Informed Consent        Medication Adjustments/Labs and Tests Ordered: Current medicines are reviewed at length with the patient today.  Concerns regarding medicines are outlined above.  Medication changes, Labs and Tests ordered today are listed in the Patient Instructions below. Patient Instructions   Medication Instructions:  Your physician recommends that you continue on your current medications as directed. Please refer to the Current Medication list given to you today.  *If you need a refill on your cardiac medications before your next appointment, please call your pharmacy*   Lab Work: None today If you have labs (blood work) drawn today and your tests are completely normal, you will receive your results only by: Marland Kitchen MyChart Message (if you have MyChart) OR . A paper copy in the mail If you have any lab test that is abnormal or we need to change your treatment, we will call you to review the results.   Testing/Procedures: None today   Follow-Up: At [facility], you and your health needs are our priority.  As part of our continuing mission to provide you with exceptional heart care, we have created designated Provider Care Teams.  These Care Teams include your primary Cardiologist (physician) and Advanced Practice Providers (APPs -  Physician Assistants and Nurse Practitioners) who all work together to provide you with the care you need, when you need it.  We recommend signing up for the patient portal called "MyChart".  Sign up information is provided on this After Visit Summary.  MyChart is used to connect with patients for Virtual Visits (Telemedicine).  Patients are able to view lab/test  results, encounter notes, upcoming appointments, etc.  Non-urgent messages can be sent to your provider as well.   To learn more about what you can do with MyChart, go to [email or web address].    Your next appointment:   4 week(s)  The format for your next  appointment:   In Person  Provider:   Bernerd Pho, PA-C or Ermalinda Barrios, PA-C   Other Instructions Take blood pressure daily for 2 weeks and then call us with results  Follow 2 Gm sodium diet   Two Gram Sodium Diet 2000 mg  What is Sodium? Sodium is a mineral found naturally in many foods. The most significant source of sodium in the diet is table salt, which is about 40% sodium.  Processed, convenience, and preserved foods also contain a large amount of sodium.  The body needs only 500 mg of sodium daily to function,  A normal diet provides more than enough sodium even if you do not use salt.  Why Limit Sodium? A build up of sodium in the body can cause thirst, increased blood pressure, shortness of breath, and water retention.  Decreasing sodium in the diet can reduce edema and risk of heart attack or stroke associated with high blood pressure.  Keep in mind that there are many other factors involved in these health problems.  Heredity, obesity, lack of exercise, cigarette smoking, stress and what you eat all play a role.  General Guidelines:  Do not add salt at the table or in cooking.  One teaspoon of salt contains over 2 grams of sodium.  Read food labels  Avoid processed and convenience foods  Ask your dietitian before eating any foods not dicussed in the menu planning guidelines  Consult your physician if you wish to use a salt substitute or a sodium containing medication such as antacids.  Limit milk and milk products to 16 oz (2 cups) per day.  Shopping Hints:  READ LABELS!! "Dietetic" does not necessarily mean low sodium.  Salt and other sodium ingredients are often added to foods during  processing.   Menu Planning Guidelines Food Group Choose More Often Avoid  Beverages (see also the milk group All fruit juices, low-sodium, salt-free vegetables juices, low-sodium carbonated beverages Regular vegetable or tomato juices, commercially softened water used for drinking or cooking  Breads and Cereals Enriched white, wheat, rye and pumpernickel bread, hard rolls and dinner rolls; muffins, cornbread and waffles; most dry cereals, cooked cereal without added salt; unsalted crackers and breadsticks; low sodium or homemade bread crumbs Bread, rolls and crackers with salted tops; quick breads; instant hot cereals; pancakes; commercial bread stuffing; self-rising flower and biscuit mixes; regular bread crumbs or cracker crumbs  Desserts and Sweets Desserts and sweets mad with mild should be within allowance Instant pudding mixes and cake mixes  Fats Butter or margarine; vegetable oils; unsalted salad dressings, regular salad dressings limited to 1 Tbs; light, sour and heavy cream Regular salad dressings containing bacon fat, bacon bits, and salt pork; snack dips made with instant soup mixes or processed cheese; salted nuts  Fruits Most fresh, frozen and canned fruits Fruits processed with salt or sodium-containing ingredient (some dried fruits are processed with sodium sulfites        Vegetables Fresh, frozen vegetables and low- sodium canned vegetables Regular canned vegetables, sauerkraut, pickled vegetables, and others prepared in brine; frozen vegetables in sauces; vegetables seasoned with ham, bacon or salt pork  Condiments, Sauces, Miscellaneous  Salt substitute with physician's approval; pepper, herbs, spices; vinegar, lemon or lime juice; hot pepper sauce; garlic powder, onion powder, low sodium soy sauce (1 Tbs.); low sodium condiments (ketchup, chili sauce, mustard) in limited amounts (1 tsp.) fresh ground horseradish; unsalted tortilla chips, pretzels, potato chips, popcorn, salsa  (1/4 cup) Any seasoning made with salt  including garlic salt, celery salt, onion salt, and seasoned salt; sea salt, rock salt, kosher salt; meat tenderizers; monosodium glutamate; mustard, regular soy sauce, barbecue, sauce, chili sauce, teriyaki sauce, steak sauce, Worcestershire sauce, and most flavored vinegars; canned gravy and mixes; regular condiments; salted snack foods, olives, picles, relish, horseradish sauce, catsup   Food preparation: Try these seasonings Meats:    Pork Sage, onion Serve with applesauce  Chicken Poultry seasoning, thyme, parsley Serve with cranberry sauce  Lamb Curry powder, rosemary, garlic, thyme Serve with mint sauce or jelly  Veal Marjoram, basil Serve with current jelly, cranberry sauce  Beef Pepper, bay leaf Serve with dry mustard, unsalted chive butter  Fish [place] leaf, dill Serve with unsalted lemon butter, unsalted parsley butter  Vegetables:    Asparagus Lemon juice   Broccoli Lemon juice   Carrots Mustard dressing parsley, mint, nutmeg, glazed with unsalted butter and sugar   Green beans Marjoram, lemon juice, nutmeg,dill seed   Tomatoes Basil, marjoram, onion   Spice /blend for [employer]" 4 tsp ground thyme 1 tsp ground sage 3 tsp ground rosemary 4 tsp ground marjoram   Test your knowledge 1. A product that says "Salt Free" may still contain sodium. True or False 2. Garlic Powder and Hot Pepper Sauce an be used as alternative seasonings.True or False 3. Processed foods have more sodium than fresh foods.  True or False 4. Canned Vegetables have less sodium than froze True or False  WAYS TO DECREASE YOUR SODIUM INTAKE 1. Avoid the use of added salt in cooking and at the table.  Table salt (and other prepared seasonings which contain salt) is probably one of the greatest sources of sodium in the diet.  Unsalted foods can gain flavor from the sweet, sour, and butter taste sensations of herbs and spices.  Instead of using salt for seasoning, try the  following seasonings with the foods listed.  Remember: how you use them to enhance natural food flavors is limited only by your creativity... Allspice-Meat, fish, eggs, fruit, peas, red and yellow vegetables Almond Extract-Fruit baked goods Anise Seed-Sweet breads, fruit, carrots, beets, cottage cheese, cookies (tastes like licorice) Basil-Meat, fish, eggs, vegetables, rice, vegetables salads, soups, sauces Bay Leaf-Meat, fish, stews, poultry Burnet-Salad, vegetables (cucumber-like flavor) Caraway Seed-Bread, cookies, cottage cheese, meat, vegetables, cheese, rice Cardamon-Baked goods, fruit, soups Celery Powder or seed-Salads, salad dressings, sauces, meatloaf, soup, bread.Do not use  celery salt Chervil-Meats, salads, fish, eggs, vegetables, cottage cheese (parsley-like flavor) Chili Power-Meatloaf, chicken cheese, corn, eggplant, egg dishes Chives-Salads cottage cheese, egg dishes, soups, vegetables, sauces Cilantro-Salsa, casseroles Cinnamon-Baked goods, fruit, pork, lamb, chicken, carrots Cloves-Fruit, baked goods, fish, pot roast, green beans, beets, carrots Coriander-Pastry, cookies, meat, salads, cheese (lemon-orange flavor) Cumin-Meatloaf, fish,cheese, eggs, cabbage,fruit pie (caraway flavor) [employer], fruit, eggs, fish, poultry, cottage cheese, vegetables Dill Seed-Meat, cottage cheese, poultry, vegetables, fish, salads, bread Fennel Seed-Bread, cookies, apples, pork, eggs, fish, beets, cabbage, cheese, Licorice-like flavor Garlic-(buds or powder) Salads, meat, poultry, fish, bread, butter, vegetables, potatoes.Do not  use garlic salt Ginger-Fruit, vegetables, baked goods, meat, fish, poultry Horseradish Root-Meet, vegetables, butter Lemon Juice or Extract-Vegetables, fruit, tea, baked goods, fish salads Mace-Baked goods fruit, vegetables, fish, poultry (taste like nutmeg) Maple Extract-Syrups Marjoram-Meat, chicken, fish, vegetables, breads, green salads (taste like  Sage) Mint-Tea, lamb, sherbet, vegetables, desserts, carrots, cabbage Mustard, Dry or Seed-Cheese, eggs, meats, vegetables, poultry Nutmeg-Baked goods, fruit, chicken, eggs, vegetables, desserts Onion Powder-Meat, fish, poultry, vegetables, cheese, eggs, bread, rice salads (Do not use   Onion  salt) Orange Extract-Desserts, baked goods Oregano-Pasta, eggs, cheese, onions, pork, lamb, fish, chicken, vegetables, green salads Paprika-Meat, fish, poultry, eggs, cheese, vegetables Parsley Flakes-Butter, vegetables, meat fish, poultry, eggs, bread, salads (certain forms may   Contain sodium Pepper-Meat fish, poultry, vegetables, eggs Peppermint Extract-Desserts, baked goods Poppy Seed-Eggs, bread, cheese, fruit dressings, baked goods, noodles, vegetables, cottage  [employer], poultry, meat, fish, cauliflower, turnips,eggs bread Saffron-Rice, bread, veal, chicken, fish, eggs Sage-Meat, fish, poultry, onions, eggplant, tomateos, pork, stews Savory-Eggs, salads, poultry, meat, rice, vegetables, soups, pork Tarragon-Meat, poultry, fish, eggs, butter, vegetables (licorice-like flavor)  Thyme-Meat, poultry, fish, eggs, vegetables, (clover-like flavor), sauces, soups Tumeric-Salads, butter, eggs, fish, rice, vegetables (saffron-like flavor) Vanilla Extract-Baked goods, candy Vinegar-Salads, vegetables, meat marinades Walnut Extract-baked goods, candy  2. Choose your Foods Wisely   The following is a list of foods to avoid which are high in sodium:  Meats-Avoid all smoked, canned, salt cured, dried and kosher meat and fish as well as Anchovies   [employer] meats:Bologna, Liverwurst, Pastrami Canned meat or fish  Marinated herring Caviar    Pepperoni Corned Beef   Pizza Dried chipped beef  Salami Frozen breaded fish or meat Salt pork Frankfurters or hot dogs  Sardines Gefilte fish   Sausage Ham (boiled ham, Proscuitto Smoked butt    spiced  ham)   Spam      TV Dinners Vegetables Canned vegetables (Regular) Relish Canned mushrooms  Sauerkraut Olives    Tomato juice Pickles  Bakery and Dessert Products Canned puddings  Cream pies Cheesecake   Decorated cakes Cookies  Beverages/Juices Tomato juice, regular  Gatorade   V-8 vegetable juice, regular  Breads and Cereals Biscuit mixes   Salted potato chips, corn chips, pretzels Bread stuffing mixes  Salted crackers and rolls Pancake and waffle mixes Self-rising flour  Seasonings Accent    Meat sauces Barbecue sauce  Meat tenderizer Catsup    Monosodium glutamate (MSG) Celery salt   Onion salt Chili sauce   Prepared mustard Garlic salt   Salt, seasoned salt, sea salt Gravy mixes   Soy sauce Horseradish   Steak sauce Ketchup   Tartar sauce Lite salt    Teriyaki sauce Marinade mixes   Worcestershire sauce  Others Baking powder   Cocoa and cocoa mixes Baking soda   Commercial casserole mixes Candy-caramels, chocolate  Dehydrated soups    Bars, fudge,nougats  Instant rice and pasta mixes Canned broth or soup  Maraschino cherries Cheese, aged and processed cheese and cheese spreads  Learning Assessment Quiz  Indicated T (for True) or F (for False) for each of the following statements:  1. _____ Fresh fruits and vegetables and unprocessed grains are generally low in sodium 2. _____ Water may contain a considerable amount of sodium, depending on the source 3. _____ You can always tell if a food is high in sodium by tasting it 4. _____ Certain laxatives my be high in sodium and should be avoided unless prescribed   by a physician or pharmacist 5. _____ Salt substitutes may be used freely by anyone on a sodium restricted diet 6. _____ Sodium is present in table salt, food additives and as a natural component of   most foods 7. _____ Table salt is approximately 90% sodium 8. _____ Limiting sodium intake may help prevent excess fluid accumulation in the  body 9. _____ On a sodium-restricted diet, seasonings such as bouillon soy sauce, and    cooking wine should be used in place of  table salt 10. _____ On an ingredient list, a product which lists monosodium glutamate as the first   ingredient is an appropriate food to include on a low sodium diet  Circle the best answer(s) to the following statements (Hint: there may be more than one correct answer)  11. On a low-sodium diet, some acceptable snack items are:    A. Olives  F. Bean dip   K. Grapefruit juice    B. Salted Pretzels G. Commercial Popcorn   L. Canned peaches    C. Carrot Sticks  H. Bouillon   M. Unsalted nuts   D. [place] fries  I. Peanut butter crackers N. Salami   E. Sweet pickles J. Tomato Juice   O. Pizza  12.  Seasonings that may be used freely on a reduced - sodium diet include   A. Lemon wedges F.Monosodium glutamate K. Celery seed    B.Soysauce   G. Pepper   L. Mustard powder   C. Sea salt  H. Cooking wine  M. Onion flakes   D. Vinegar  E. Prepared horseradish N. Salsa   E. Sage   J. Worcestershire sauce  O. [address]      Sumner Boast, PA-C  10/12/2020 11:35 AM    [facility] [place], [place], [place]  [address] Phone: [phone]; Fax: [phone]

## 2020-10-12 ENCOUNTER — Encounter: Payer: Self-pay | Admitting: Physician Assistant

## 2020-10-12 ENCOUNTER — Ambulatory Visit (INDEPENDENT_AMBULATORY_CARE_PROVIDER_SITE_OTHER): Payer: Medicare HMO | Admitting: Physician Assistant

## 2020-10-12 ENCOUNTER — Other Ambulatory Visit: Payer: Self-pay

## 2020-10-12 VITALS — BP 164/80 | HR 58 | Ht 64.0 in | Wt 157.0 lb

## 2020-10-12 DIAGNOSIS — I10 Essential (primary) hypertension: Secondary | ICD-10-CM | POA: Diagnosis not present

## 2020-10-12 DIAGNOSIS — I5042 Chronic combined systolic (congestive) and diastolic (congestive) heart failure: Secondary | ICD-10-CM | POA: Diagnosis not present

## 2020-10-12 DIAGNOSIS — R001 Bradycardia, unspecified: Secondary | ICD-10-CM | POA: Diagnosis not present

## 2020-10-12 DIAGNOSIS — N1832 Chronic kidney disease, stage 3b: Secondary | ICD-10-CM

## 2020-10-12 NOTE — Patient Instructions (Signed)
Medication Instructions:  Your physician recommends that you continue on your current medications as directed. Please refer to the Current Medication list given to you today.  *If you need a refill on your cardiac medications before your next appointment, please call your pharmacy*   Lab Work: None today If you have labs (blood work) drawn today and your tests are completely normal, you will receive your results only by: Marland Kitchen MyChart Message (if you have MyChart) OR . A paper copy in the mail If you have any lab test that is abnormal or we need to change your treatment, we will call you to review the results.   Testing/Procedures: None today   Follow-Up: At [facility], you and your health needs are our priority.  As part of our continuing mission to provide you with exceptional heart care, we have created designated Provider Care Teams.  These Care Teams include your primary Cardiologist (physician) and Advanced Practice Providers (APPs -  Physician Assistants and Nurse Practitioners) who all work together to provide you with the care you need, when you need it.  We recommend signing up for the patient portal called "MyChart".  Sign up information is provided on this After Visit Summary.  MyChart is used to connect with patients for Virtual Visits (Telemedicine).  Patients are able to view lab/test results, encounter notes, upcoming appointments, etc.  Non-urgent messages can be sent to your provider as well.   To learn more about what you can do with MyChart, go to [email or web address].    Your next appointment:   4 week(s)  The format for your next appointment:   In Person  Provider:   Bernerd Pho, PA-C or Ermalinda Barrios, PA-C   Other Instructions Take blood pressure daily for 2 weeks and then call us with results  Follow 2 Gm sodium diet   Two Gram Sodium Diet 2000 mg  What is Sodium? Sodium is a mineral found naturally in many foods. The most significant source  of sodium in the diet is table salt, which is about 40% sodium.  Processed, convenience, and preserved foods also contain a large amount of sodium.  The body needs only 500 mg of sodium daily to function,  A normal diet provides more than enough sodium even if you do not use salt.  Why Limit Sodium? A build up of sodium in the body can cause thirst, increased blood pressure, shortness of breath, and water retention.  Decreasing sodium in the diet can reduce edema and risk of heart attack or stroke associated with high blood pressure.  Keep in mind that there are many other factors involved in these health problems.  Heredity, obesity, lack of exercise, cigarette smoking, stress and what you eat all play a role.  General Guidelines:  Do not add salt at the table or in cooking.  One teaspoon of salt contains over 2 grams of sodium.  Read food labels  Avoid processed and convenience foods  Ask your dietitian before eating any foods not dicussed in the menu planning guidelines  Consult your physician if you wish to use a salt substitute or a sodium containing medication such as antacids.  Limit milk and milk products to 16 oz (2 cups) per day.  Shopping Hints:  READ LABELS!! "Dietetic" does not necessarily mean low sodium.  Salt and other sodium ingredients are often added to foods during processing.   Menu Planning Guidelines Food Group Choose More Often Avoid  Beverages (see also the milk group All  fruit juices, low-sodium, salt-free vegetables juices, low-sodium carbonated beverages Regular vegetable or tomato juices, commercially softened water used for drinking or cooking  Breads and Cereals Enriched white, wheat, rye and pumpernickel bread, hard rolls and dinner rolls; muffins, cornbread and waffles; most dry cereals, cooked cereal without added salt; unsalted crackers and breadsticks; low sodium or homemade bread crumbs Bread, rolls and crackers with salted tops; quick breads; instant  hot cereals; pancakes; commercial bread stuffing; self-rising flower and biscuit mixes; regular bread crumbs or cracker crumbs  Desserts and Sweets Desserts and sweets mad with mild should be within allowance Instant pudding mixes and cake mixes  Fats Butter or margarine; vegetable oils; unsalted salad dressings, regular salad dressings limited to 1 Tbs; light, sour and heavy cream Regular salad dressings containing bacon fat, bacon bits, and salt pork; snack dips made with instant soup mixes or processed cheese; salted nuts  Fruits Most fresh, frozen and canned fruits Fruits processed with salt or sodium-containing ingredient (some dried fruits are processed with sodium sulfites        Vegetables Fresh, frozen vegetables and low- sodium canned vegetables Regular canned vegetables, sauerkraut, pickled vegetables, and others prepared in brine; frozen vegetables in sauces; vegetables seasoned with ham, bacon or salt pork  Condiments, Sauces, Miscellaneous  Salt substitute with physician's approval; pepper, herbs, spices; vinegar, lemon or lime juice; hot pepper sauce; garlic powder, onion powder, low sodium soy sauce (1 Tbs.); low sodium condiments (ketchup, chili sauce, mustard) in limited amounts (1 tsp.) fresh ground horseradish; unsalted tortilla chips, pretzels, potato chips, popcorn, salsa (1/4 cup) Any seasoning made with salt including garlic salt, celery salt, onion salt, and seasoned salt; sea salt, rock salt, kosher salt; meat tenderizers; monosodium glutamate; mustard, regular soy sauce, barbecue, sauce, chili sauce, teriyaki sauce, steak sauce, Worcestershire sauce, and most flavored vinegars; canned gravy and mixes; regular condiments; salted snack foods, olives, picles, relish, horseradish sauce, catsup   Food preparation: Try these seasonings Meats:    Pork Sage, onion Serve with applesauce  Chicken Poultry seasoning, thyme, parsley Serve with cranberry sauce  Lamb Curry powder,  rosemary, garlic, thyme Serve with mint sauce or jelly  Veal Marjoram, basil Serve with current jelly, cranberry sauce  Beef Pepper, bay leaf Serve with dry mustard, unsalted chive butter  [place] leaf, dill Serve with unsalted lemon butter, unsalted parsley butter  Vegetables:    Asparagus Lemon juice   Broccoli Lemon juice   Carrots Mustard dressing parsley, mint, nutmeg, glazed with unsalted butter and sugar   Green beans Marjoram, lemon juice, nutmeg,dill seed   Tomatoes Basil, marjoram, onion   Spice /blend for [employer]" 4 tsp ground thyme 1 tsp ground sage 3 tsp ground rosemary 4 tsp ground marjoram   Test your knowledge 1. A product that says "Salt Free" may still contain sodium. True or False 2. Garlic Powder and Hot Pepper Sauce an be used as alternative seasonings.True or False 3. Processed foods have more sodium than fresh foods.  True or False 4. Canned Vegetables have less sodium than froze True or False  WAYS TO DECREASE YOUR SODIUM INTAKE 1. Avoid the use of added salt in cooking and at the table.  Table salt (and other prepared seasonings which contain salt) is probably one of the greatest sources of sodium in the diet.  Unsalted foods can gain flavor from the sweet, sour, and butter taste sensations of herbs and spices.  Instead of using salt for seasoning, try the following seasonings with  the foods listed.  Remember: how you use them to enhance natural food flavors is limited only by your creativity... Allspice-Meat, fish, eggs, fruit, peas, red and yellow vegetables Almond Extract-Fruit baked goods Anise Seed-Sweet breads, fruit, carrots, beets, cottage cheese, cookies (tastes like licorice) Basil-Meat, fish, eggs, vegetables, rice, vegetables salads, soups, sauces Bay Leaf-Meat, fish, stews, poultry Burnet-Salad, vegetables (cucumber-like flavor) Caraway Seed-Bread, cookies, cottage cheese, meat, vegetables, cheese, rice Cardamon-Baked goods, fruit,  soups Celery Powder or seed-Salads, salad dressings, sauces, meatloaf, soup, bread.Do not use  celery salt Chervil-Meats, salads, fish, eggs, vegetables, cottage cheese (parsley-like flavor) Chili Power-Meatloaf, chicken cheese, corn, eggplant, egg dishes Chives-Salads cottage cheese, egg dishes, soups, vegetables, sauces Cilantro-Salsa, casseroles Cinnamon-Baked goods, fruit, pork, lamb, chicken, carrots Cloves-Fruit, baked goods, fish, pot roast, green beans, beets, carrots Coriander-Pastry, cookies, meat, salads, cheese (lemon-orange flavor) Cumin-Meatloaf, fish,cheese, eggs, cabbage,fruit pie (caraway flavor) [employer], fruit, eggs, fish, poultry, cottage cheese, vegetables Dill Seed-Meat, cottage cheese, poultry, vegetables, fish, salads, bread Fennel Seed-Bread, cookies, apples, pork, eggs, fish, beets, cabbage, cheese, Licorice-like flavor Garlic-(buds or powder) Salads, meat, poultry, fish, bread, butter, vegetables, potatoes.Do not  use garlic salt Ginger-Fruit, vegetables, baked goods, meat, fish, poultry Horseradish Root-Meet, vegetables, butter Lemon Juice or Extract-Vegetables, fruit, tea, baked goods, fish salads Mace-Baked goods fruit, vegetables, fish, poultry (taste like nutmeg) Maple Extract-Syrups Marjoram-Meat, chicken, fish, vegetables, breads, green salads (taste like Sage) Mint-Tea, lamb, sherbet, vegetables, desserts, carrots, cabbage Mustard, Dry or Seed-Cheese, eggs, meats, vegetables, poultry Nutmeg-Baked goods, fruit, chicken, eggs, vegetables, desserts Onion Powder-Meat, fish, poultry, vegetables, cheese, eggs, bread, rice salads (Do not use   Onion salt) Orange Extract-Desserts, baked goods Oregano-Pasta, eggs, cheese, onions, pork, lamb, fish, chicken, vegetables, green salads Paprika-Meat, fish, poultry, eggs, cheese, vegetables Parsley Flakes-Butter, vegetables, meat fish, poultry, eggs, bread, salads (certain forms may   Contain  sodium Pepper-Meat fish, poultry, vegetables, eggs Peppermint Extract-Desserts, baked goods Poppy Seed-Eggs, bread, cheese, fruit dressings, baked goods, noodles, vegetables, cottage  [employer], poultry, meat, fish, cauliflower, turnips,eggs bread Saffron-Rice, bread, veal, chicken, fish, eggs Sage-Meat, fish, poultry, onions, eggplant, tomateos, pork, stews Savory-Eggs, salads, poultry, meat, rice, vegetables, soups, pork Tarragon-Meat, poultry, fish, eggs, butter, vegetables (licorice-like flavor)  Thyme-Meat, poultry, fish, eggs, vegetables, (clover-like flavor), sauces, soups Tumeric-Salads, butter, eggs, fish, rice, vegetables (saffron-like flavor) Vanilla Extract-Baked goods, candy Vinegar-Salads, vegetables, meat marinades Walnut Extract-baked goods, candy  2. Choose your Foods Wisely   The following is a list of foods to avoid which are high in sodium:  Meats-Avoid all smoked, canned, salt cured, dried and kosher meat and fish as well as Anchovies   [employer] meats:Bologna, Liverwurst, Pastrami Canned meat or fish  Marinated herring Caviar    Pepperoni Corned Beef   Pizza Dried chipped beef  Salami Frozen breaded fish or meat Salt pork Frankfurters or hot dogs  Sardines Gefilte fish   Sausage Ham (boiled ham, Proscuitto Smoked butt    spiced ham)   Spam      TV Dinners Vegetables Canned vegetables (Regular) Relish Canned mushrooms  Sauerkraut Olives    Tomato juice Pickles  Bakery and Dessert Products Canned puddings  Cream pies Cheesecake   Decorated cakes Cookies  Beverages/Juices Tomato juice, regular  Gatorade   V-8 vegetable juice, regular  Breads and Cereals Biscuit mixes   Salted potato chips, corn chips, pretzels Bread stuffing mixes  Salted crackers and rolls Pancake and waffle mixes Self-rising flour  Seasonings Accent    Meat sauces Barbecue sauce  Meat tenderizer Catsup    Monosodium  glutamate (MSG) Celery salt   Onion salt Chili sauce   Prepared mustard Garlic salt   Salt, seasoned salt, sea salt Gravy mixes   Soy sauce Horseradish   Steak sauce Ketchup   Tartar sauce Lite salt    Teriyaki sauce Marinade mixes   Worcestershire sauce  Others Baking powder   Cocoa and cocoa mixes Baking soda   Commercial casserole mixes Candy-caramels, chocolate  Dehydrated soups    Bars, fudge,nougats  Instant rice and pasta mixes Canned broth or soup  Maraschino cherries Cheese, aged and processed cheese and cheese spreads  Learning Assessment Quiz  Indicated T (for True) or F (for False) for each of the following statements:  1. _____ Fresh fruits and vegetables and unprocessed grains are generally low in sodium 2. _____ Water may contain a considerable amount of sodium, depending on the source 3. _____ You can always tell if a food is high in sodium by tasting it 4. _____ Certain laxatives my be high in sodium and should be avoided unless prescribed   by a physician or pharmacist 5. _____ Salt substitutes may be used freely by anyone on a sodium restricted diet 6. _____ Sodium is present in table salt, food additives and as a natural component of   most foods 7. _____ Table salt is approximately 90% sodium 8. _____ Limiting sodium intake may help prevent excess fluid accumulation in the body 9. _____ On a sodium-restricted diet, seasonings such as bouillon soy sauce, and    cooking wine should be used in place of table salt 10. _____ On an ingredient list, a product which lists monosodium glutamate as the first   ingredient is an appropriate food to include on a low sodium diet  Circle the best answer(s) to the following statements (Hint: there may be more than one correct answer)  11. On a low-sodium diet, some acceptable snack items are:    A. Olives  F. Bean dip   K. Grapefruit juice    B. Salted Pretzels G. Commercial Popcorn   L. Canned peaches    C. Carrot  Sticks  H. Bouillon   M. Unsalted nuts   D. [place] fries  I. Peanut butter crackers N. Salami   E. Sweet pickles J. Tomato Juice   O. Pizza  12.  Seasonings that may be used freely on a reduced - sodium diet include   A. Lemon wedges F.Monosodium glutamate K. Celery seed    B.Soysauce   G. Pepper   L. Mustard powder   C. Sea salt  H. Cooking wine  M. Onion flakes   D. Vinegar  E. Prepared horseradish N. Salsa   E. Sage   J. Worcestershire sauce  O. Chutney

## 2020-11-18 ENCOUNTER — Ambulatory Visit (INDEPENDENT_AMBULATORY_CARE_PROVIDER_SITE_OTHER): Payer: Medicare HMO | Admitting: Student

## 2020-11-18 ENCOUNTER — Other Ambulatory Visit: Payer: Self-pay

## 2020-11-18 ENCOUNTER — Encounter: Payer: Self-pay | Admitting: Student

## 2020-11-18 VITALS — BP 152/78 | HR 54 | Ht 64.0 in | Wt 160.4 lb

## 2020-11-18 DIAGNOSIS — I10 Essential (primary) hypertension: Secondary | ICD-10-CM

## 2020-11-18 DIAGNOSIS — E782 Mixed hyperlipidemia: Secondary | ICD-10-CM

## 2020-11-18 DIAGNOSIS — I5020 Unspecified systolic (congestive) heart failure: Secondary | ICD-10-CM | POA: Diagnosis not present

## 2020-11-18 DIAGNOSIS — N184 Chronic kidney disease, stage 4 (severe): Secondary | ICD-10-CM

## 2020-11-18 MED ORDER — HYDRALAZINE HCL 100 MG PO TABS: 100.0000 mg | ORAL_TABLET | Freq: Two times a day (BID) | ORAL | 3 refills | Status: DC

## 2020-11-18 MED ORDER — METOPROLOL SUCCINATE ER 25 MG PO TB24: 25.0000 mg | ORAL_TABLET | Freq: Every day | ORAL | 3 refills | Status: DC

## 2020-11-18 NOTE — Progress Notes (Signed)
Cardiology Office Note    Date:  11/19/2020   ID:  Maria Parrish, DOB 1946/06/25, MRN [id number]  PCP:  Vidal Schwalbe, MD  Cardiologist: Carlyle Dolly, MD    Chief Complaint  Patient presents with  . Follow-up    1 month visit    History of Present Illness:    Maria Parrish is a 75 y.o. 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   She was last examined by MErmalinda Barrios PA-C on 10/12/2020 and reported overall doing well and denied any recent chest pain or dyspnea. BP was elevated but she reported consuming a high-sodium diet. She was encouraged to follow BP at home and report back with results. Was continued on Amlodipine '5mg'$  daily, Lasix '80mg'$  BID, Hydralazine '50mg'$  TID, Imdur '30mg'$  daily and Lopressor 12.'5mg'$  BID (had bradycardia with higher dosing). Was not on ACE-I/ARB/ARNI/Spiro given her advanced CKD.   In talking with the patient and her son today, she reports overall doing well since her last office visit. She lives with her son but stays active around the house and walks her dog outside multiple times a day. She denies any recent chest pain, palpitations, dyspnea on exertion, orthopnea, PND or lower extremity edema. Her son does help manage her medications and reports she only takes Hydralazine twice daily.  Past Medical History:  Diagnosis Date  . Assistance needed for mobility    walks with walker  . Confusion   . Dementia ([place]   . Diabetes mellitus without complication ([place]   . Generalized weakness   . Hypercalcemia   . Hyperlipidemia   . [place]parathyroidism ([place]   . Hypertension   . Hypertension   . Moderate protein-calorie malnutrition ([place]   . Peripheral neuropathy   . UTI (lower urinary tract infection)     Past Surgical History:  Procedure Laterality Date  . COLONOSCOPY N/A 08/12/2015   Procedure: COLONOSCOPY;  Surgeon: RDaneil Dolin MD;  [facility];  Service: Endoscopy;  Laterality: N/A;  1245pm  . ESOPHAGOGASTRODUODENOSCOPY N/A 08/12/2015   Procedure: ESOPHAGOGASTRODUODENOSCOPY (EGD);  Surgeon: RDaneil Dolin MD;  [facility];  Service: Endoscopy;  Laterality: N/A;  . none      Current Medications: Outpatient Medications Prior to Visit  Medication Sig Dispense Refill  . amLODipine (NORVASC) 5 MG tablet Take by mouth daily.    .Marland Kitchenaspirin EC 81 MG tablet Take 81 mg by mouth daily.    . calcitRIOL (ROCALTROL) 0.25 MCG capsule Take 0.25 mcg by mouth 3 (three) times a week.    . Cyanocobalamin (VITAMIN B-12 PO) Take 1 tablet by mouth daily.    .Marland Kitchenezetimibe (ZETIA) 10 MG tablet daily.  2  . FEROSUL 325 (65 Fe) MG tablet Take 325 mg by mouth 2 (two) times daily.    . furosemide (LASIX) 80 MG tablet Take 80 mg by mouth 2 (two) times daily.    .Marland Kitchengabapentin (NEURONTIN) 300 MG capsule Take 300 mg by mouth 2 (two) times daily.    . insulin degludec (TRESIBA FLEXTOUCH) 100 UNIT/ML FlexTouch Pen Inject 25 Units into the skin at bedtime. 15 mL 1  . isosorbide mononitrate (IMDUR) 30 MG 24 hr tablet Take 1 tablet (30 mg total) by mouth daily. 30 tablet 1  . sodium bicarbonate 650 MG tablet See admin instructions.    .Marland Kitchen  hydrALAZINE (APRESOLINE) 50 MG tablet Take 1 tablet (50 mg total) by mouth every 8 (eight) hours. 270 tablet 2  . metoprolol tartrate (LOPRESSOR) 25 MG tablet Take 0.5 tablets (12.5 mg total) by mouth 2 (two) times daily. 90 tablet 3   No facility-administered medications prior to visit.     Allergies:   Penicillins, Statins, and Statins support [acid blockers support]   Social History   Socioeconomic History  . Marital status: Widowed    Spouse name: Not on file  . Number of children: 2  . Years of education: Not on file  . Highest education level: Not on file  Occupational History  . Occupation: Nutritional therapist for [employer]  . Smoking status: Never Smoker  . Smokeless  tobacco: Never Used  Vaping Use  . Vaping Use: Never used  Substance and Sexual Activity  . Alcohol use: No  . Drug use: No  . Sexual activity: Not Currently  Other Topics Concern  . Not on file  Social History Narrative  . Not on file   Social Determinants of Health   Financial Resource Strain: Not on file  Food Insecurity: Not on file  Transportation Needs: No Transportation Needs  . Lack of Transportation (Medical): No  . Lack of Transportation (Non-Medical): No  Physical Activity: Inactive  . Days of Exercise per Week: 0 days  . Minutes of Exercise per Session: 0 min  Stress: Not on file  Social Connections: Not on file     Family History:  The patient's family history includes Diabetes in her brother and father; Heart attack in her mother; Hypertension in her brother and brother.   Review of Systems:    Please see the history of present illness.     All other systems reviewed and are otherwise negative except as noted above.   Physical Exam:    VS:  BP (!) 152/78   Pulse (!) 54   Ht '5\' 4"'$  (1.626 m)   Wt 160 lb 6.4 oz (72.8 kg)   SpO2 99%   BMI 27.53 kg/m    General: Well developed, well nourished,female appearing in no acute distress. Head: Normocephalic, atraumatic. Neck: No carotid bruits. JVD not elevated.  Lungs: Respirations regular and unlabored, without wheezes or rales.  Heart: Regular rate and rhythm. No S3 or S4.  No murmur, no rubs, or gallops appreciated. Abdomen: Appears non-distended. No obvious abdominal masses. Msk:  Strength and tone appear normal for age. No obvious joint deformities or effusions. Extremities: No clubbing or cyanosis. Trace ankle edema bilaterally.  Distal pedal pulses are 2+ bilaterally. Neuro: Alert and oriented X 3. Moves all extremities spontaneously. No focal deficits noted. Psych:  Responds to questions appropriately with a normal affect. Skin: No rashes or lesions noted  Wt Readings from Last 3 Encounters:   11/18/20 160 lb 6.4 oz (72.8 kg)  10/12/20 157 lb (71.2 kg)  09/15/20 154 lb (69.9 kg)     Studies/Labs Reviewed:   EKG:  EKG is not ordered today.    Recent Labs: 05/12/2020: B Natriuretic Peptide 1,352.0 05/14/2020: Magnesium 2.2 08/09/2020: ALT 30; TSH 3.100 09/16/2020: BUN 63; Creatinine, Ser 2.88; Hemoglobin 9.3; Platelets 300; Potassium 3.6; Sodium 137   Lipid Panel    Component Value Date/Time   CHOL 173 09/23/2015 0537   TRIG 96 09/23/2015 0537   HDL 36 (L) 09/23/2015 0537   CHOLHDL 4.8 09/23/2015 0537   VLDL 19 09/23/2015 0537   LDLCALC 118 (H) 09/23/2015 [id number]  Additional studies/ records that were reviewed today include:   Echocardiogram: 04/2020 IMPRESSIONS    1. Left ventricular ejection fraction, by estimation, is 40 to 45%. The  left ventricle has mildly decreased function. The left ventricle  demonstrates global hypokinesis. The left ventricular internal cavity size  was mildly dilated. There is mild left  ventricular hypertrophy. Left ventricular diastolic parameters are  consistent with Grade II diastolic dysfunction (pseudonormalization).  Elevated left atrial pressure.  2. Right ventricular systolic function is mildly reduced. The right  ventricular size is normal. There is moderately elevated pulmonary artery  systolic pressure. The estimated right ventricular systolic pressure is  [id number] mmHg.  3. The mitral valve is grossly normal. Mild to moderate mitral valve  regurgitation.  4. The aortic valve is tricuspid. Aortic valve regurgitation is not  visualized. Mild aortic valve sclerosis is present, with no evidence of  aortic valve stenosis.  5. The inferior vena cava is dilated in size with >50% respiratory  variability, suggesting right atrial pressure of 8 mmHg.   Assessment:    1. 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 Essential hypertension   3. Mixed hyperlipidemia   4. CKD (chronic kidney disease) stage 4, GFR 15-29  ml/min (HCC)      Plan:   In order of problems listed above:  1. HFmrEF - Her EF was at 40-45% by echo in 04/2020 and medical therapy was recommended given her advanced CKD.  - She denies any recent orthopnea, PND or edema. Weight has been stable on her home scales.  - Will consolidate her Lopressor 12.'5mg'$  BID to Toprol-XL '25mg'$  daily in the setting of her cardiomyopathy. She has only been taking Hydralazine as BID dosing, therefore will titrate this to '100mg'$  BID given her BP remains above goal. Continue Imdur '30mg'$  daily. No ACE-I/ARB/ARNi/Spiro given her CKD. Remains on Lasix '80mg'$  BID and will defer diuretic dosing to Nephrology. Will plan to schedule an echo at the time of her next visit for reassessment of her EF.   2. HTN - BP is at 152/78 during today's visit with similar results by recheck and her son reports similar readings when checked at home. Will plan to titrate Hydralazine as outlined above. Continue Amlodipine, Imdur and Toprol-XL.   3. HLD - Followed by her PCP. She has been intolerant to multiple statins in the past and remains on Zetia '10mg'$  daily.   4. Stage 4 CKD - Followed by Dr. Theador Hawthorne. Creatinine was at 3.61 in 10/2020 by review of Care Everywhere.    Medication Adjustments/Labs and Tests Ordered: Current medicines are reviewed at length with the patient today.  Concerns regarding medicines are outlined above.  Medication changes, Labs and Tests ordered today are listed in the Patient Instructions below. Patient Instructions  Medication Instructions:   Take Hydralazine 100 mg twice a day   STOP Lopressor   START Toprol XL 25 mg daily     *If you need a refill on your cardiac medications before your next appointment, please call your pharmacy*   Lab Work: None today If you have labs (blood work) drawn today and your tests are completely normal, you will receive your results only by: Marland Kitchen MyChart Message (if you have MyChart) OR . A paper copy in the  mail If you have any lab test that is abnormal or we need to change your treatment, we will call you to review the results.   Testing/Procedures: None today    Follow-Up: At [facility], you and your health needs are our priority.  As part of our continuing mission to provide you with exceptional heart care, we have created designated Provider Care Teams.  These Care Teams include your primary Cardiologist (physician) and Advanced Practice Providers (APPs -  Physician Assistants and Nurse Practitioners) who all work together to provide you with the care you need, when you need it.  We recommend signing up for the patient portal called "MyChart".  Sign up information is provided on this After Visit Summary.  MyChart is used to connect with patients for Virtual Visits (Telemedicine).  Patients are able to view lab/test results, encounter notes, upcoming appointments, etc.  Non-urgent messages can be sent to your provider as well.   To learn more about what you can do with MyChart, go to [email or web address].    Your next appointment:   3-4  month(s)  The format for your next appointment:   In Person  Provider:   You may see Carlyle Dolly, MD or one of the following Advanced Practice Providers on your designated Care Team:    Bernerd Pho, PA-C       Other Instructions None       Signed, Waynetta Pean  11/19/2020 8:35 AM    [place] [address] [place], [place] [address] Phone: [phone] Fax: [phone]

## 2020-11-18 NOTE — Patient Instructions (Signed)
Medication Instructions:   Take Hydralazine 100 mg twice a day   STOP Lopressor   START Toprol XL 25 mg daily     *If you need a refill on your cardiac medications before your next appointment, please call your pharmacy*   Lab Work: None today If you have labs (blood work) drawn today and your tests are completely normal, you will receive your results only by: Marland Kitchen MyChart Message (if you have MyChart) OR . A paper copy in the mail If you have any lab test that is abnormal or we need to change your treatment, we will call you to review the results.   Testing/Procedures: None today    Follow-Up: At [facility], you and your health needs are our priority.  As part of our continuing mission to provide you with exceptional heart care, we have created designated Provider Care Teams.  These Care Teams include your primary Cardiologist (physician) and Advanced Practice Providers (APPs -  Physician Assistants and Nurse Practitioners) who all work together to provide you with the care you need, when you need it.  We recommend signing up for the patient portal called "MyChart".  Sign up information is provided on this After Visit Summary.  MyChart is used to connect with patients for Virtual Visits (Telemedicine).  Patients are able to view lab/test results, encounter notes, upcoming appointments, etc.  Non-urgent messages can be sent to your provider as well.   To learn more about what you can do with MyChart, go to [email or web address].    Your next appointment:   3-4  month(s)  The format for your next appointment:   In Person  Provider:   You may see Carlyle Dolly, MD or one of the following Advanced Practice Providers on your designated Care Team:    Bernerd Pho, [place]       Other Instructions None

## 2020-11-19 ENCOUNTER — Encounter: Payer: Self-pay | Admitting: Student

## 2020-11-23 ENCOUNTER — Telehealth: Payer: Self-pay | Admitting: "Endocrinology

## 2020-11-23 NOTE — Telephone Encounter (Signed)
Received medical record request from [place], sent request to [facility] and [facility].

## 2020-12-07 ENCOUNTER — Ambulatory Visit: Payer: Medicare HMO | Admitting: "Endocrinology

## 2020-12-15 ENCOUNTER — Other Ambulatory Visit (HOSPITAL_COMMUNITY)
Admission: RE | Admit: 2020-12-15 | Discharge: 2020-12-15 | Disposition: A | Discharge status: Home / Self Care | Payer: Medicare HMO | Source: Ambulatory Visit | Attending: Nephrology | Admitting: Nephrology

## 2020-12-15 ENCOUNTER — Encounter (HOSPITAL_COMMUNITY): Payer: Self-pay

## 2020-12-15 ENCOUNTER — Other Ambulatory Visit: Payer: Self-pay

## 2020-12-15 DIAGNOSIS — D631 Anemia in chronic kidney disease: Secondary | ICD-10-CM | POA: Diagnosis present

## 2020-12-15 DIAGNOSIS — N184 Chronic kidney disease, stage 4 (severe): Secondary | ICD-10-CM | POA: Insufficient documentation

## 2020-12-15 LAB — RENAL FUNCTION PANEL
Albumin: 3.4 g/dL — ABNORMAL LOW (ref 3.5–5.0)
Anion gap: 10 (ref 5–15)
BUN: 59 mg/dL — ABNORMAL HIGH (ref 8–23)
CO2: 23 mmol/L (ref 22–32)
Calcium: 9 mg/dL (ref 8.9–10.3)
Chloride: 104 mmol/L (ref 98–111)
Creatinine, Ser: 3.16 mg/dL — ABNORMAL HIGH (ref 0.44–1.00)
GFR, Estimated: 15 mL/min — ABNORMAL LOW (ref >60)
Glucose, Bld: 107 mg/dL — ABNORMAL HIGH (ref 70–99)
Phosphorus: 3 mg/dL (ref 2.5–4.6)
Potassium: 2.8 mmol/L — ABNORMAL LOW (ref 3.5–5.1)
Sodium: 137 mmol/L (ref 135–145)

## 2020-12-15 LAB — CBC WITH DIFFERENTIAL/PLATELET
Abs Immature Granulocytes: 0.01 10*3/uL (ref 0.00–0.07)
Basophils Absolute: 0 10*3/uL (ref 0.0–0.1)
Basophils Relative: 0 %
Eosinophils Absolute: 0.1 10*3/uL (ref 0.0–0.5)
Eosinophils Relative: 2 %
HCT: 30.3 % — ABNORMAL LOW (ref 36.0–46.0)
Hemoglobin: 10.5 g/dL — ABNORMAL LOW (ref 12.0–15.0)
Immature Granulocytes: 0 %
Lymphocytes Relative: 20 %
Lymphs Abs: 0.9 10*3/uL (ref 0.7–4.0)
MCH: 30.3 pg (ref 26.0–34.0)
MCHC: 34.7 g/dL (ref 30.0–36.0)
MCV: 87.6 fL (ref 80.0–100.0)
Monocytes Absolute: 0.4 10*3/uL (ref 0.1–1.0)
Monocytes Relative: 9 %
Neutro Abs: 3.2 10*3/uL (ref 1.7–7.7)
Neutrophils Relative %: 69 %
Platelets: 290 10*3/uL (ref 150–400)
RBC: 3.46 MIL/uL — ABNORMAL LOW (ref 3.87–5.11)
RDW: 12.1 % (ref 11.5–15.5)
WBC: 4.7 10*3/uL (ref 4.0–10.5)
nRBC: 0 % (ref 0.0–0.2)

## 2020-12-15 LAB — POCT HEMOGLOBIN-HEMACUE: Hemoglobin: 11.2 g/dL — ABNORMAL LOW (ref 12.0–15.0)

## 2020-12-15 MED ORDER — EPOETIN ALFA-EPBX 3000 UNIT/ML IJ SOLN: 3000.0000 [IU] | Freq: Once | INTRAMUSCULAR | Status: DC

## 2020-12-29 ENCOUNTER — Encounter (HOSPITAL_COMMUNITY)
Admission: RE | Admit: 2020-12-29 | Discharge: 2020-12-29 | Disposition: A | Discharge status: Home / Self Care | Payer: Medicare HMO | Source: Ambulatory Visit | Attending: Nephrology | Admitting: Nephrology

## 2020-12-29 ENCOUNTER — Encounter: Payer: Self-pay | Admitting: "Endocrinology

## 2020-12-29 ENCOUNTER — Other Ambulatory Visit: Payer: Self-pay

## 2020-12-29 ENCOUNTER — Ambulatory Visit (INDEPENDENT_AMBULATORY_CARE_PROVIDER_SITE_OTHER): Payer: Medicare HMO | Admitting: "Endocrinology

## 2020-12-29 VITALS — BP 200/93 | HR 65 | Ht 64.0 in | Wt 150.0 lb

## 2020-12-29 DIAGNOSIS — E118 Type 2 diabetes mellitus with unspecified complications: Secondary | ICD-10-CM

## 2020-12-29 DIAGNOSIS — E1165 Type 2 diabetes mellitus with hyperglycemia: Secondary | ICD-10-CM | POA: Diagnosis not present

## 2020-12-29 DIAGNOSIS — IMO0002 Reserved for concepts with insufficient information to code with codable children: Secondary | ICD-10-CM

## 2020-12-29 DIAGNOSIS — E782 Mixed hyperlipidemia: Secondary | ICD-10-CM | POA: Diagnosis not present

## 2020-12-29 DIAGNOSIS — I10 Essential (primary) hypertension: Secondary | ICD-10-CM

## 2020-12-29 DIAGNOSIS — D631 Anemia in chronic kidney disease: Secondary | ICD-10-CM | POA: Diagnosis not present

## 2020-12-29 DIAGNOSIS — N184 Chronic kidney disease, stage 4 (severe): Secondary | ICD-10-CM | POA: Diagnosis present

## 2020-12-29 DIAGNOSIS — I739 Peripheral vascular disease, unspecified: Secondary | ICD-10-CM

## 2020-12-29 LAB — POCT HEMOGLOBIN-HEMACUE: Hemoglobin: 11 g/dL — ABNORMAL LOW (ref 12.0–15.0)

## 2020-12-29 LAB — POCT GLYCOSYLATED HEMOGLOBIN (HGB A1C): HbA1c, POC (controlled diabetic range): 7.8 % — AB (ref 0.0–7.0)

## 2020-12-29 MED ORDER — CALCITRIOL 0.25 MCG PO CAPS: 0.2500 ug | ORAL_CAPSULE | ORAL | 1 refills | Status: DC

## 2020-12-29 MED ORDER — ISOSORBIDE MONONITRATE ER 30 MG PO TB24: 30.0000 mg | ORAL_TABLET | Freq: Every day | ORAL | 1 refills | Status: DC

## 2020-12-29 MED ORDER — EPOETIN ALFA-EPBX 3000 UNIT/ML IJ SOLN: 3000.0000 [IU] | Freq: Once | INTRAMUSCULAR | Status: DC

## 2020-12-29 MED ORDER — CALCITRIOL 0.25 MCG PO CAPS: 0.2500 ug | ORAL_CAPSULE | Freq: Every day | ORAL | 1 refills | Status: DC

## 2020-12-29 NOTE — Patient Instructions (Signed)

## 2020-12-29 NOTE — Progress Notes (Signed)
12/29/2020  Endocrinology follow-up note  Subjective:    Patient ID: Maria Parrish, female    DOB: 17-Apr-1946, PCP Vidal Schwalbe, MD   Past Medical History:  Diagnosis Date   Assistance needed for mobility    walks with walker   Confusion    Dementia ([place])    Diabetes mellitus without complication ([place])    Generalized weakness    Hypercalcemia    Hyperlipidemia    Hyperparathyroidism ([place])    Hypertension    Hypertension    Moderate protein-calorie malnutrition ([place])    Peripheral neuropathy    UTI (lower urinary tract infection)    Past Surgical History:  Procedure Laterality Date   COLONOSCOPY N/A 08/12/2015   Procedure: COLONOSCOPY;  Surgeon: Daneil Dolin, MD;  [facility];  Service: Endoscopy;  Laterality: N/A;  1245pm   ESOPHAGOGASTRODUODENOSCOPY N/A 08/12/2015   Procedure: ESOPHAGOGASTRODUODENOSCOPY (EGD);  Surgeon: Daneil Dolin, MD;  [facility];  Service: Endoscopy;  Laterality: N/A;   none     Social History   Socioeconomic History   Marital status: Widowed    Spouse name: Not on file   Number of children: 2   Years of education: Not on file   Highest education level: Not on file  Occupational History   Occupation: fabricator for [employer]  Tobacco Use   Smoking status: Never   Smokeless tobacco: Never  Vaping Use   Vaping Use: Never used  Substance and Sexual Activity   Alcohol use: No   Drug use: No   Sexual activity: Not Currently  Other Topics Concern   Not on file  Social History Narrative   Not on file   Social Determinants of Health   Financial Resource Strain: Not on file  Food Insecurity: Not on file  Transportation Needs: No Transportation Needs   Lack of Transportation (Medical): No   Lack of Transportation (Non-Medical): No  Physical Activity: Inactive   Days of Exercise per Week: 0 days   Minutes of Exercise per Session: 0 min  Stress: Not on file  Social Connections: Not on file   Outpatient Encounter  Medications as of 12/29/2020  Medication Sig   amLODipine (NORVASC) 5 MG tablet Take by mouth daily.   aspirin EC 81 MG tablet Take 81 mg by mouth daily.   calcitRIOL (ROCALTROL) 0.25 MCG capsule Take 1 capsule (0.25 mcg total) by mouth 3 (three) times a week.   Cyanocobalamin (VITAMIN B-12 PO) Take 1 tablet by mouth daily.   ezetimibe (ZETIA) 10 MG tablet daily.   FEROSUL 325 (65 Fe) MG tablet Take 325 mg by mouth 2 (two) times daily.   furosemide (LASIX) 80 MG tablet Take 80 mg by mouth 2 (two) times daily.   gabapentin (NEURONTIN) 300 MG capsule Take 300 mg by mouth 2 (two) times daily.   hydrALAZINE (APRESOLINE) 100 MG tablet Take 1 tablet (100 mg total) by mouth 2 (two) times daily.   insulin degludec (TRESIBA FLEXTOUCH) 100 UNIT/ML FlexTouch Pen Inject 25 Units into the skin at bedtime.   isosorbide mononitrate (IMDUR) 30 MG 24 hr tablet Take 1 tablet (30 mg total) by mouth daily.   metoprolol succinate (TOPROL XL) 25 MG 24 hr tablet Take 1 tablet (25 mg total) by mouth daily.   sodium bicarbonate 650 MG tablet See admin instructions.   [DISCONTINUED] calcitRIOL (ROCALTROL) 0.25 MCG capsule Take 0.25 mcg by mouth 3 (three) times a week.   [DISCONTINUED] isosorbide mononitrate (IMDUR) 30 MG  24 hr tablet Take 1 tablet (30 mg total) by mouth daily.   No facility-administered encounter medications on file as of 12/29/2020.   ALLERGIES: Allergies  Allergen Reactions   Penicillins Other (See Comments)    Makes patient sore.  Has patient had a PCN reaction causing immediate rash, facial/tongue/throat swelling, SOB or lightheadedness with hypotension: No Has patient had a PCN reaction causing severe rash involving mucus membranes or skin necrosis: No Has patient had a PCN reaction that required hospitalization No Has patient had a PCN reaction occurring within the last 10 years: No If all of the above answers are "NO", then may proceed with Cephalosporin use.    Statins Other (See  Comments)    Makes wrists and fingers ache Other reaction(s): Muscle Pain   Statins Support [Acid Blockers Support]     Other reaction(s): joint pain   VACCINATION STATUS:  There is no immunization history on file for this patient.  Diabetes  -75 year old female patient with medical history as above.    She was originally seen for secondary hyperparathyroidism with elevated PTH as a result of CKD.  She did not require active intervention, more recent labs show acceptable PTH range between 110-130, with calcium remaining normal at 9.4-10 mg per DL.  She is following with nephrology, for advancing CKD, she is only on observation management for this.     -She is being seen in follow-up for management of her currently and chronically  uncontrolled, type 2 diabetes.   After reluctance for several months, she accepted low-dose basal insulin.  During her last visit, she was advised to take Tresiba 25 units nightly.  She insists that she is doing this regimen as ordered.  Her meter shows only 6 readings in the last 30 days.  Her point-of-care A1c is 7.8% unchanged from her last visit A1c was 7. 9%, however overall improving from  >14%.      Due to worsening CKD, she was taken off of Metformin during her recent hospitalization.  She has no new complaints today.  She feels better. - Her thyroid ultrasound shows mild goiter with no discrete nodules. - She has no new complaints.  Review of Systems Limited as above.   Objective:    BP (!) 200/93 Comment: pt has not taken her BP medication today  Pulse 65   Ht '5\' 4"'$  (1.626 m)   Wt 150 lb (68 kg)   BMI 25.75 kg/m   Wt Readings from Last 3 Encounters:  12/29/20 150 lb (68 kg)  12/29/20 160 lb 7.9 oz (72.8 kg)  12/15/20 160 lb 7.9 oz (72.8 kg)     CMP Latest Ref Rng & Units 12/15/2020 09/16/2020 08/09/2020  Glucose 70 - 99 mg/dL 107(H) 233(H) 159(H)  BUN 8 - 23 mg/dL 59(H) 63(H) 55(H)  Creatinine 0.44 - 1.00 mg/dL 3.16(H) 2.88(H) 2.69(H)   Sodium 135 - 145 mmol/L 137 137 138  Potassium 3.5 - 5.1 mmol/L 2.8(L) 3.6 4.6  Chloride 98 - 111 mmol/L 104 100 105  CO2 22 - 32 mmol/L 23 26 18(L)  Calcium 8.9 - 10.3 mg/dL 9.0 9.3 9.4  Total Protein 6.0 - 8.5 g/dL - - 6.8  Total Bilirubin 0.0 - 1.2 mg/dL - - <0.2  Alkaline Phos 44 - 121 IU/L - - 110  AST 0 - 40 IU/L - - 23  ALT 0 - 32 IU/L - - 30     Diabetic Labs (most recent): Lab Results  Component Value Date  HGBA1C 7.8 (A) 12/29/2020   HGBA1C 7.9 (A) 09/07/2020   HGBA1C 7.9 (H) 05/12/2020   Lipid Panel     Component Value Date/Time   CHOL 173 09/23/2015 0537   TRIG 96 09/23/2015 0537   HDL 36 (L) 09/23/2015 0537   CHOLHDL 4.8 09/23/2015 0537   VLDL 19 09/23/2015 0537   LDLCALC 118 (H) 09/23/2015 0537      Assessment & Plan:   1. Uncontrolled type 2 diabetes mellitus without complication, without long-term current use of insulin ([place]) -She presents with a meter showing only rare readings averaging 171.  Her point-of-care A1c is 7.8%, overall improving  from>14%. -She has responded to low-dose basal insulin she reluctantly accepted during her last visit.      -She is advised to continue Tresiba 25 units nightly, associated with monitoring of blood glucose twice a day-daily before breakfast and at bedtime.    She is not a candidate for metformin.  She is encouraged to call clinic for blood glucose less than 70 or  greater than  200 mg/dL.   -She does not have a safe oral regimen to manage diabetes.  - she acknowledges that there is a room for improvement in her food and drink choices. - Suggestion is made for her to avoid simple carbohydrates  from her diet including Cakes, Sweet Desserts, Ice Cream, Soda (diet and regular), Sweet Tea, Candies, Chips, Cookies, Store Bought Juices, Alcohol in Excess of  1-2 drinks a day, Artificial Sweeteners,  Coffee Creamer, and "Sugar-free" Products, Lemonade. This will help patient to have more stable blood glucose profile  and potentially avoid unintended weight gain.   2. Hypercalcemia: Resolved  - Her repeat labs show normal calcium of 9.0,  PTH remaining stable between 110-127.  this combination is likely a result of CKD. - She is vitamin D replete now . -She will not need any intervention for at this time. - Her neck/thyroid ultrasound was unremarkable. -She will be considered for 24-hour urine calcium and creatinine measurement on subsequent visits. -She is encouraged to stay close to her nephrologist.   3. Essential hypertension  -Her blood pressure is not controlled to target.  She reports that she has not taken her medication this morning.  She has lisinopril 30 mg p.o. daily at breakfast, metoprolol 25 mg p.o. daily, isosorbide 30 mg p.o. daily, hydralazine 100 mg p.o. twice daily, amlodipine 5 mg p.o. daily, and Lasix as needed.  She will not need more prescriptions.  She is advised to be consistent with her available medications.  She was given refills on her  isosorbide based on her request.   - She is hesitant to add any additional therapy, but willing to discuss this with her PMD.   4. Vitamin D deficiency: See #1 above. -She is status post therapy with vitamin D, currently at 28 ng/dL.  She is given a refill on her calcitriol 0.5 mg p.o. daily.   5. Goiter -Her thyroid ultrasound showed mild goiter with no discrete nodules. No intervention needed at this point.   6) peripheral arterial disease on screening ABI -Patient will benefit from more directed assessment, will be referred to vascular surgery.  POCT ABI Results 12/29/20  Her ABI is abnormal today Right ABI: PAD      left ABI: PAD Arm systolic / diastolic: 123456 mmHG -She will be sent to vascular surgery for better assessment.  - I advised patient to maintain close follow up with Vidal Schwalbe, MD for primary care  needs.   I spent 32 minutes in the care of the patient today including review of labs from Thyroid Function,  CMP, and other relevant labs ; imaging/biopsy records (current and previous including abstractions from other facilities); face-to-face time discussing  her lab results and symptoms, medications doses, her options of short and long term treatment based on the latest standards of care / guidelines;   and documenting the encounter.  Zikeria M Grega  participated in the discussions, expressed understanding, and voiced agreement with the above plans.  All questions were answered to her satisfaction. she is encouraged to contact clinic should she have any questions or concerns prior to her return visit.   Follow up plan: Return in about 6 months (around 06/30/2021) for F/U with Pre-visit Labs, Meter, Logs, A1c here.Glade Lloyd, MD Phone: [phone]  Fax: [phone]  -  This note was partially dictated with voice recognition software. Similar sounding words can be transcribed inadequately or may not  be corrected upon review.  12/29/2020, 6:16 PM

## 2021-01-12 ENCOUNTER — Other Ambulatory Visit: Payer: Self-pay

## 2021-01-12 ENCOUNTER — Encounter (HOSPITAL_COMMUNITY)
Admission: RE | Admit: 2021-01-12 | Discharge: 2021-01-12 | Disposition: A | Discharge status: Home / Self Care | Payer: Medicare HMO | Source: Ambulatory Visit | Attending: Nephrology | Admitting: Nephrology

## 2021-01-12 DIAGNOSIS — N184 Chronic kidney disease, stage 4 (severe): Secondary | ICD-10-CM | POA: Diagnosis not present

## 2021-01-12 MED ORDER — EPOETIN ALFA-EPBX 3000 UNIT/ML IJ SOLN
3000.0000 [IU] | Freq: Once | INTRAMUSCULAR | Status: AC
  Administered 2021-01-12: 15:00:00 3000 [IU] via SUBCUTANEOUS

## 2021-01-26 ENCOUNTER — Encounter (HOSPITAL_COMMUNITY): Admission: RE | Admit: 2021-01-26 | Payer: Medicare HMO | Source: Ambulatory Visit

## 2021-02-01 ENCOUNTER — Other Ambulatory Visit: Payer: Self-pay

## 2021-02-01 ENCOUNTER — Encounter (HOSPITAL_COMMUNITY)
Admission: RE | Admit: 2021-02-01 | Discharge: 2021-02-01 | Disposition: A | Discharge status: Home / Self Care | Payer: Medicare HMO | Source: Ambulatory Visit | Attending: Nephrology | Admitting: Nephrology

## 2021-02-01 DIAGNOSIS — N184 Chronic kidney disease, stage 4 (severe): Secondary | ICD-10-CM | POA: Insufficient documentation

## 2021-02-01 DIAGNOSIS — D631 Anemia in chronic kidney disease: Secondary | ICD-10-CM | POA: Insufficient documentation

## 2021-02-01 LAB — CBC WITH DIFFERENTIAL/PLATELET
Abs Immature Granulocytes: 0.04 10*3/uL (ref 0.00–0.07)
Basophils Absolute: 0 10*3/uL (ref 0.0–0.1)
Basophils Relative: 1 %
Eosinophils Absolute: 0.2 10*3/uL (ref 0.0–0.5)
Eosinophils Relative: 3 %
HCT: 27.9 % — ABNORMAL LOW (ref 36.0–46.0)
Hemoglobin: 9.6 g/dL — ABNORMAL LOW (ref 12.0–15.0)
Immature Granulocytes: 1 %
Lymphocytes Relative: 21 %
Lymphs Abs: 1.5 10*3/uL (ref 0.7–4.0)
MCH: 30 pg (ref 26.0–34.0)
MCHC: 34.4 g/dL (ref 30.0–36.0)
MCV: 87.2 fL (ref 80.0–100.0)
Monocytes Absolute: 0.4 10*3/uL (ref 0.1–1.0)
Monocytes Relative: 6 %
Neutro Abs: 5 10*3/uL (ref 1.7–7.7)
Neutrophils Relative %: 68 %
Platelets: 360 10*3/uL (ref 150–400)
RBC: 3.2 MIL/uL — ABNORMAL LOW (ref 3.87–5.11)
RDW: 13.9 % (ref 11.5–15.5)
WBC: 7.3 10*3/uL (ref 4.0–10.5)
nRBC: 0 % (ref 0.0–0.2)

## 2021-02-01 LAB — RENAL FUNCTION PANEL
Albumin: 3.5 g/dL (ref 3.5–5.0)
Anion gap: 9 (ref 5–15)
BUN: 35 mg/dL — ABNORMAL HIGH (ref 8–23)
CO2: 23 mmol/L (ref 22–32)
Calcium: 9.2 mg/dL (ref 8.9–10.3)
Chloride: 100 mmol/L (ref 98–111)
Creatinine, Ser: 3.15 mg/dL — ABNORMAL HIGH (ref 0.44–1.00)
GFR, Estimated: 15 mL/min — ABNORMAL LOW (ref >60)
Glucose, Bld: 218 mg/dL — ABNORMAL HIGH (ref 70–99)
Phosphorus: 2.9 mg/dL (ref 2.5–4.6)
Potassium: 3.4 mmol/L — ABNORMAL LOW (ref 3.5–5.1)
Sodium: 132 mmol/L — ABNORMAL LOW (ref 135–145)

## 2021-02-01 LAB — POCT HEMOGLOBIN-HEMACUE: Hemoglobin: 10.6 g/dL — ABNORMAL LOW (ref 12.0–15.0)

## 2021-02-01 MED ORDER — EPOETIN ALFA-EPBX 3000 UNIT/ML IJ SOLN: 3000.0000 [IU] | Freq: Once | INTRAMUSCULAR | Status: DC

## 2021-02-04 LAB — POCT HEMOGLOBIN-HEMACUE: Hemoglobin: 8.9 g/dL — ABNORMAL LOW (ref 12.0–15.0)

## 2021-02-15 ENCOUNTER — Encounter (HOSPITAL_COMMUNITY)
Admission: RE | Admit: 2021-02-15 | Discharge: 2021-02-15 | Disposition: A | Discharge status: Home / Self Care | Payer: Medicare HMO | Source: Ambulatory Visit | Attending: Nephrology | Admitting: Nephrology

## 2021-02-15 ENCOUNTER — Other Ambulatory Visit: Payer: Self-pay

## 2021-02-15 DIAGNOSIS — D631 Anemia in chronic kidney disease: Secondary | ICD-10-CM | POA: Diagnosis present

## 2021-02-15 DIAGNOSIS — N184 Chronic kidney disease, stage 4 (severe): Secondary | ICD-10-CM | POA: Insufficient documentation

## 2021-02-15 MED ORDER — EPOETIN ALFA-EPBX 3000 UNIT/ML IJ SOLN
INTRAMUSCULAR | Status: AC
  Filled 2021-02-15: qty 1

## 2021-02-15 MED ORDER — EPOETIN ALFA-EPBX 3000 UNIT/ML IJ SOLN
3000.0000 [IU] | Freq: Once | INTRAMUSCULAR | Status: AC
  Administered 2021-02-15: 3000 [IU] via SUBCUTANEOUS

## 2021-02-16 ENCOUNTER — Emergency Department (HOSPITAL_COMMUNITY)
Admission: EM | Admit: 2021-02-16 | Discharge: 2021-02-16 | Disposition: A | Discharge status: Home / Self Care | Payer: Medicare HMO | Attending: Emergency Medicine | Admitting: Emergency Medicine

## 2021-02-16 ENCOUNTER — Emergency Department (HOSPITAL_COMMUNITY): Payer: Medicare HMO

## 2021-02-16 ENCOUNTER — Encounter (HOSPITAL_COMMUNITY): Payer: Self-pay | Admitting: *Deleted

## 2021-02-16 DIAGNOSIS — I110 Hypertensive heart disease with heart failure: Secondary | ICD-10-CM | POA: Insufficient documentation

## 2021-02-16 DIAGNOSIS — E119 Type 2 diabetes mellitus without complications: Secondary | ICD-10-CM | POA: Diagnosis not present

## 2021-02-16 DIAGNOSIS — Z794 Long term (current) use of insulin: Secondary | ICD-10-CM | POA: Diagnosis not present

## 2021-02-16 DIAGNOSIS — S42292A Other displaced fracture of upper end of left humerus, initial encounter for closed fracture: Secondary | ICD-10-CM | POA: Diagnosis not present

## 2021-02-16 DIAGNOSIS — W01198A Fall on same level from slipping, tripping and stumbling with subsequent striking against other object, initial encounter: Secondary | ICD-10-CM | POA: Insufficient documentation

## 2021-02-16 DIAGNOSIS — I5033 Acute on chronic diastolic (congestive) heart failure: Secondary | ICD-10-CM | POA: Diagnosis not present

## 2021-02-16 DIAGNOSIS — S42202A Unspecified fracture of upper end of left humerus, initial encounter for closed fracture: Secondary | ICD-10-CM

## 2021-02-16 DIAGNOSIS — Z7982 Long term (current) use of aspirin: Secondary | ICD-10-CM | POA: Insufficient documentation

## 2021-02-16 DIAGNOSIS — Z79899 Other long term (current) drug therapy: Secondary | ICD-10-CM | POA: Diagnosis not present

## 2021-02-16 DIAGNOSIS — S4992XA Unspecified injury of left shoulder and upper arm, initial encounter: Secondary | ICD-10-CM | POA: Diagnosis present

## 2021-02-16 DIAGNOSIS — F0390 Unspecified dementia without behavioral disturbance: Secondary | ICD-10-CM | POA: Insufficient documentation

## 2021-02-16 LAB — POCT HEMOGLOBIN-HEMACUE: Hemoglobin: 9.6 g/dL — ABNORMAL LOW (ref 12.0–15.0)

## 2021-02-16 NOTE — ED Triage Notes (Signed)
Pt with left shoulder pain after bumping against the wall yesterday.  Limited ROM to left arm as well.

## 2021-02-16 NOTE — Discharge Instructions (Addendum)
Your x-ray today shows that you have a broken bone in your left shoulder.  Recommend that you keep the sling in place.  You may apply ice packs on and off if needed.  Take Tylenol every 4 hours if needed for pain.  Call the orthopedic provider listed to arrange follow-up appointment.

## 2021-02-16 NOTE — ED Provider Notes (Signed)
[facility] EMERGENCY DEPARTMENT Provider Note   CSN: [id number] Arrival date & time: 02/16/21  1259     History Chief Complaint  Patient presents with   Shoulder Pain    Maria Parrish is a 75 y.o. female.   Shoulder Pain Associated symptoms: no back pain, no fatigue, no fever and no neck pain        Maria Parrish is a 75 y.o. female with past medical history of dementia, diabetes, hypertension, and peripheral neuropathy who presents to the Emergency Department complaining of left shoulder pain after direct blow that occurred yesterday.  She states that she was feeding her dog and fell up against the edge of a door striking her left shoulder on the door frame.  She complains of pain to the anterior and posterior left shoulder.  Pain is worse with attempted movement.  Pain does not radiate into her elbow or lower arm.  She denies decreased grip strength, head injury, LOC, numbness or weakness.  She has not tried any medications or therapies prior to arrival.    Past Medical History:  Diagnosis Date   Assistance needed for mobility    walks with walker   Confusion    Dementia ([place])    Diabetes mellitus without complication ([place])    Generalized weakness    Hypercalcemia    Hyperlipidemia    Hyperparathyroidism ([place])    Hypertension    Hypertension    Moderate protein-calorie malnutrition ([place])    Peripheral neuropathy    UTI (lower urinary tract infection)     Patient Active Problem List   Diagnosis Date Noted   Peripheral arterial disease ([place]) 09/07/2020   Acute on chronic diastolic (congestive) heart failure ([place]) 05/13/2020   Acute on chronic diastolic congestive heart failure ([place]) 05/12/2020   Elevated serum creatinine 05/12/2020   Personal history of noncompliance with medical treatment, presenting hazards to health 12/17/2017   Hyperparathyroidism ([place]) 10/19/2016   Vitamin D deficiency 01/12/2016   Altered mental status 09/22/2015   Left hemiparesis ([place])  09/22/2015   Acute encephalopathy 09/22/2015   Palliative care encounter    UTI (urinary tract infection) 09/14/2015   Dementia ([place]) 09/14/2015   General weakness 09/14/2015   Subacute confusional state 09/14/2015   Uncontrolled type 2 diabetes mellitus with complication, without long-term current use of insulin ([place]) 09/14/2015   Confusion 09/14/2015   Fall at home    Elevated parathyroid hormone 09/09/2015   Mucosal abnormality of stomach    Diverticulosis of colon without hemorrhage    Hypercalcemia 08/10/2015   Goiter 08/10/2015   Abnormal weight loss 07/28/2015   Uncontrolled type 2 diabetes mellitus ([place]) 03/26/2015   Neuropathy 03/26/2015   Hypokalemia 03/26/2015   Essential hypertension, benign 03/26/2015   Mixed hyperlipidemia 03/26/2015   Malnutrition of moderate degree ([place]) 03/26/2015    Past Surgical History:  Procedure Laterality Date   COLONOSCOPY N/A 08/12/2015   Procedure: COLONOSCOPY;  Surgeon: Daneil Dolin, MD;  Location: [facility];  Service: Endoscopy;  Laterality: N/A;  1245pm   ESOPHAGOGASTRODUODENOSCOPY N/A 08/12/2015   Procedure: ESOPHAGOGASTRODUODENOSCOPY (EGD);  Surgeon: Daneil Dolin, MD;  Location: [facility] ENDO SUITE;  Service: Endoscopy;  Laterality: N/A;   none       OB History   No obstetric history on file.     Family History  Problem Relation Age of Onset   Hypertension Brother    Hypertension Brother    Diabetes Brother    Diabetes Father  Heart attack Mother    Colon cancer Neg Hx     Social History   Tobacco Use   Smoking status: Never   Smokeless tobacco: Never  Vaping Use   Vaping Use: Never used  Substance Use Topics   Alcohol use: No   Drug use: No    Home Medications Prior to Admission medications   Medication Sig Start Date End Date Taking? Authorizing Provider  amLODipine (NORVASC) 5 MG tablet Take by mouth daily.    [provider]  aspirin EC 81 MG tablet Take 81 mg by mouth daily.    [provider]  calcitRIOL (ROCALTROL) 0.25 MCG capsule Take 1 capsule (0.25 mcg total) by mouth daily. 12/29/20   Cassandria Anger, MD  Cyanocobalamin (VITAMIN B-12 PO) Take 1 tablet by mouth daily.    [provider]  ezetimibe (ZETIA) 10 MG tablet daily. 01/28/18   [provider]  FEROSUL 325 (65 Fe) MG tablet Take 325 mg by mouth 2 (two) times daily. 02/08/20   [provider]  furosemide (LASIX) 80 MG tablet Take 80 mg by mouth 2 (two) times daily.    [provider]  gabapentin (NEURONTIN) 300 MG capsule Take 300 mg by mouth 2 (two) times daily. 08/19/18   [provider]  hydrALAZINE (APRESOLINE) 100 MG tablet Take 1 tablet (100 mg total) by mouth 2 (two) times daily. 11/18/20   Strader, Fransisco Hertz, PA-C  insulin degludec (TRESIBA FLEXTOUCH) 100 UNIT/ML FlexTouch Pen Inject 25 Units into the skin at bedtime. 09/07/20   Cassandria Anger, MD  isosorbide mononitrate (IMDUR) 30 MG 24 hr tablet Take 1 tablet (30 mg total) by mouth daily. 12/29/20   Cassandria Anger, MD  metoprolol succinate (TOPROL XL) 25 MG 24 hr tablet Take 1 tablet (25 mg total) by mouth daily. 11/18/20   Strader, Fransisco Hertz, PA-C  sodium bicarbonate 650 MG tablet See admin instructions.    [provider]    Allergies    Penicillins, Statins, and Statins support [acid blockers support]  Review of Systems   Review of Systems  Constitutional:  Negative for chills, fatigue and fever.  Respiratory:  Negative for cough, chest tightness and shortness of breath.   Cardiovascular:  Negative for chest pain.  Gastrointestinal:  Negative for abdominal pain, nausea and vomiting.  Genitourinary:  Negative for dysuria, flank pain and hematuria.  Musculoskeletal:  Positive for arthralgias (left shoulder pain). Negative for back pain, myalgias, neck pain and neck stiffness.  Skin:  Negative for rash and wound.  Neurological:  Negative for dizziness, weakness, numbness and  headaches.  Hematological:  Does not bruise/bleed easily.   Physical Exam Updated Vital Signs BP (!) 141/69 (BP Location: Right Arm)   Pulse 67   Temp 98.3 F (36.8 C) (Oral)   Resp 16   Ht '5\' 4"'$  (1.626 m)   Wt 69.9 kg   SpO2 95%   BMI 26.43 kg/m   Physical Exam Vitals and nursing note reviewed.  Constitutional:      Appearance: Normal appearance. She is not ill-appearing or toxic-appearing.  HENT:     Head: Atraumatic.     Mouth/Throat:     Mouth: Mucous membranes are moist.  Neck:     Thyroid: No thyromegaly.     Meningeal: Kernig's sign absent.  Cardiovascular:     Rate and Rhythm: Normal rate and regular rhythm.     Pulses: Normal pulses.  Pulmonary:     Effort:  Pulmonary effort is normal.     Breath sounds: Normal breath sounds. No wheezing.  Abdominal:     Palpations: Abdomen is soft.     Tenderness: There is no abdominal tenderness. There is no guarding or rebound.  Musculoskeletal:        General: Tenderness and signs of injury present. No swelling.     Left shoulder: Bony tenderness present. No swelling or deformity. Decreased range of motion. Normal strength. Normal pulse.     Cervical back: Full passive range of motion without pain, normal range of motion and neck supple. No tenderness.     Comments: Tenderness palpation of the anterior left shoulder and left trapezius muscle.  Pain with attempted abduction, patient unable to tolerate passive range of motion due to level of pain.  No bony deformity or edema.  Left wrist and elbow nontender.  Skin:    General: Skin is warm.     Capillary Refill: Capillary refill takes less than 2 seconds.     Findings: No rash.  Neurological:     Mental Status: She is alert and oriented to person, place, and time.     Sensory: No sensory deficit.     Motor: No weakness.    ED Results / Procedures / Treatments   Labs (all labs ordered are listed, but only abnormal results are displayed) Labs Reviewed - No data to  display  EKG None  Radiology DG Shoulder Left  Result Date: 02/16/2021 CLINICAL DATA:  Fall. EXAM: LEFT SHOULDER - 2+ VIEW COMPARISON:  Chest x-ray 05/12/2020. FINDINGS: Slightly displaced fracture of the proximal humerus is present. No evidence of dislocation or separation. IMPRESSION: Slightly displaced fracture of the proximal humerus. Electronically Signed   By: Marcello Moores  Register   On: 02/16/2021 14:12    Procedures Procedures   Medications Ordered in ED Medications - No data to display  ED Course  I have reviewed the triage vital signs and the nursing notes.  Pertinent labs & imaging results that were available during my care of the patient were reviewed by me and considered in my medical decision making (see chart for details).    MDM Rules/Calculators/A&P                           And here from home for evaluation of direct blow to the left shoulder.  Pain with attempted range of motion.  Neurovascular intact.  No open wounds or bony deformity on exam. On review of x-ray, patient has a slightly displaced fracture at the proximal humerus.  She will be placed in a sling and she is agreeable to close outpatient follow-up with orthopedics.pt agreeable to tylenol for pain  Final Clinical Impression(s) / ED Diagnoses Final diagnoses:  Closed fracture of proximal end of left humerus, unspecified fracture morphology, initial encounter    Rx / DC Orders ED Discharge Orders     None        Kem Parkinson, PA-C 02/16/21 1653    Elnora Morrison, MD 02/17/21 1555

## 2021-02-17 ENCOUNTER — Telehealth: Payer: Self-pay | Admitting: Orthopedic Surgery

## 2021-02-17 NOTE — Telephone Encounter (Signed)
Patient son called and states his mom fell on Monday and he took her to the ER yesterday and she has a broken bone and for them to call us,   Report says   FINDINGS: Slightly displaced fracture of the proximal humerus is present  Is this something that can wait till you come back from vacation?   Please advise     Maria Parrish patient son is the one that called Patient home number is [phone].

## 2021-02-17 NOTE — Telephone Encounter (Signed)
Noted  

## 2021-02-18 ENCOUNTER — Other Ambulatory Visit: Payer: Self-pay

## 2021-02-18 ENCOUNTER — Encounter: Payer: Self-pay | Admitting: Orthopedic Surgery

## 2021-02-18 ENCOUNTER — Ambulatory Visit (INDEPENDENT_AMBULATORY_CARE_PROVIDER_SITE_OTHER): Payer: Medicare HMO | Admitting: Orthopedic Surgery

## 2021-02-18 VITALS — BP 225/99 | HR 68

## 2021-02-18 DIAGNOSIS — S42292A Other displaced fracture of upper end of left humerus, initial encounter for closed fracture: Secondary | ICD-10-CM | POA: Diagnosis not present

## 2021-02-18 NOTE — Telephone Encounter (Signed)
Patient aware of appointment scheduled Friday, 02/18/21, 8:30am, with Dr Amedeo Kinsman.

## 2021-02-18 NOTE — Progress Notes (Signed)
New Patient Visit  Assessment: Maria Parrish is a 75 y.o. LHD female with the following: Left proximal humerus fracture, humeral impaction; nonoperative management in a sling with close follow up  Plan: Reviewed radiographs with the patient and her son in clinic today.  She has sustained a minimally displaced left proximal humerus fracture.  There is impaction of the humeral shaft into the articular surface.  The joint remains reduced.  We will proceed with nonoperative management, and plan for close follow-up.  She should remain in the sling at all times until the next visit.  Her sling was adjusted in clinic today.  As long as there is not a significant change in the alignment, I do think she will regain most of the function she had prior to the injury.  Follow-up in 2-3 weeks.   Follow-up: Return in about 2 weeks (around 03/04/2021).  Subjective:  Chief Complaint  Patient presents with   New Patient (Initial Visit)   Arm Injury    Fractured humerus    History of Present Illness: Maria Parrish is a 75 y.o. female who presents for evaluation of left shoulder pain.  She was seen in the emergency room 2 days ago, after losing her balance and hitting her left shoulder against a door frame.  She was feeding her dog at home, when she stumbled.  She noted immediate pain.  Her son brought her to the emergency department.  X-rays in the emergency department demonstrated a proximal humerus fracture.  No injuries elsewhere.  Since her visit to the ED, she has been wearing a sling at all times.  She is taking Tylenol for pain.   Review of Systems: No fevers or chills No numbness or tingling No chest pain No shortness of breath No bowel or bladder dysfunction No GI distress No headaches   Medical History:  Past Medical History:  Diagnosis Date   Assistance needed for mobility    walks with walker   Confusion    Dementia ([place])    Diabetes mellitus without complication ([facility])     Generalized weakness    Hypercalcemia    Hyperlipidemia    Hyperparathyroidism ([place])    Hypertension    Hypertension    Moderate protein-calorie malnutrition ([place])    Peripheral neuropathy    UTI (lower urinary tract infection)     Past Surgical History:  Procedure Laterality Date   COLONOSCOPY N/A 08/12/2015   Procedure: COLONOSCOPY;  Surgeon: Daneil Dolin, MD;  [facility];  Service: Endoscopy;  Laterality: N/A;  1245pm   ESOPHAGOGASTRODUODENOSCOPY N/A 08/12/2015   Procedure: ESOPHAGOGASTRODUODENOSCOPY (EGD);  Surgeon: Daneil Dolin, MD;  [facility];  Service: Endoscopy;  Laterality: N/A;   none      Family History  Problem Relation Age of Onset   Hypertension Brother    Hypertension Brother    Diabetes Brother    Diabetes Father    Heart attack Mother    Colon cancer Neg Hx    Social History   Tobacco Use   Smoking status: Never   Smokeless tobacco: Never  Vaping Use   Vaping Use: Never used  Substance Use Topics   Alcohol use: No   Drug use: No    Allergies  Allergen Reactions   Penicillins Other (See Comments)    Makes patient sore.  Has patient had a PCN reaction causing immediate rash, facial/tongue/throat swelling, SOB or lightheadedness with hypotension: No Has patient had a PCN reaction  causing severe rash involving mucus membranes or skin necrosis: No Has patient had a PCN reaction that required hospitalization No Has patient had a PCN reaction occurring within the last 10 years: No If all of the above answers are "NO", then may proceed with Cephalosporin use.    Statins Other (See Comments)    Makes wrists and fingers ache Other reaction(s): Muscle Pain   Statins Support [Acid Blockers Support]     Other reaction(s): joint pain    Current Meds  Medication Sig   amLODipine (NORVASC) 5 MG tablet Take by mouth daily.   aspirin EC 81 MG tablet Take 81 mg by mouth daily.   calcitRIOL (ROCALTROL) 0.25 MCG capsule Take 1  capsule (0.25 mcg total) by mouth daily.   Cyanocobalamin (VITAMIN B-12 PO) Take 1 tablet by mouth daily.   ezetimibe (ZETIA) 10 MG tablet daily.   FEROSUL 325 (65 Fe) MG tablet Take 325 mg by mouth 2 (two) times daily.   furosemide (LASIX) 80 MG tablet Take 80 mg by mouth 2 (two) times daily.   gabapentin (NEURONTIN) 300 MG capsule Take 300 mg by mouth 2 (two) times daily.   hydrALAZINE (APRESOLINE) 100 MG tablet Take 1 tablet (100 mg total) by mouth 2 (two) times daily.   insulin degludec (TRESIBA FLEXTOUCH) 100 UNIT/ML FlexTouch Pen Inject 25 Units into the skin at bedtime.   isosorbide mononitrate (IMDUR) 30 MG 24 hr tablet Take 1 tablet (30 mg total) by mouth daily.   metoprolol succinate (TOPROL XL) 25 MG 24 hr tablet Take 1 tablet (25 mg total) by mouth daily.   potassium chloride (KLOR-CON) 10 MEQ tablet Take 10 mEq by mouth daily.   sodium bicarbonate 650 MG tablet See admin instructions.    Objective: BP (!) 225/99   Pulse 68   Physical Exam:  General: Elderly female., Alert and oriented., and No acute distress. Gait: Ambulates with the assistance of a cane.  Left arm is in a sling.  Size seems appropriate, but needs an adjustment.  Bruising in the upper arm.  Sensation intact in the axillary patch.  Active motion intact in the AIN/PIN/U nerve distribution.  Sensation intact throughout the left hand.  Tenderness to palpation about the shoulder.  IMAGING: I personally reviewed images previously obtained from the ED  X-rays of the left shoulder demonstrate a reduced glenohumeral joint.  There is a left proximal humerus fracture, with impaction of the shaft into the humeral head.  Overall alignment remains good.  No significant displacement.   New Medications:  No orders of the defined types were placed in this encounter.     Mordecai Rasmussen, MD  02/18/2021 6:56 PM

## 2021-03-01 ENCOUNTER — Other Ambulatory Visit: Payer: Self-pay

## 2021-03-01 ENCOUNTER — Encounter (HOSPITAL_COMMUNITY)
Admission: RE | Admit: 2021-03-01 | Discharge: 2021-03-01 | Disposition: A | Discharge status: Home / Self Care | Payer: Medicare HMO | Source: Ambulatory Visit | Attending: Nephrology | Admitting: Nephrology

## 2021-03-01 DIAGNOSIS — N184 Chronic kidney disease, stage 4 (severe): Secondary | ICD-10-CM | POA: Diagnosis not present

## 2021-03-01 LAB — POCT HEMOGLOBIN-HEMACUE: Hemoglobin: 10.3 g/dL — ABNORMAL LOW (ref 12.0–15.0)

## 2021-03-01 MED ORDER — EPOETIN ALFA-EPBX 3000 UNIT/ML IJ SOLN: 3000.0000 [IU] | Freq: Once | INTRAMUSCULAR | Status: DC

## 2021-03-04 ENCOUNTER — Encounter: Payer: Medicare HMO | Admitting: Orthopedic Surgery

## 2021-03-04 ENCOUNTER — Ambulatory Visit: Payer: Medicare HMO

## 2021-03-04 ENCOUNTER — Other Ambulatory Visit: Payer: Self-pay

## 2021-03-04 ENCOUNTER — Encounter: Payer: Self-pay | Admitting: Orthopedic Surgery

## 2021-03-04 ENCOUNTER — Ambulatory Visit (INDEPENDENT_AMBULATORY_CARE_PROVIDER_SITE_OTHER): Payer: Medicare HMO | Admitting: Orthopedic Surgery

## 2021-03-04 DIAGNOSIS — S42292D Other displaced fracture of upper end of left humerus, subsequent encounter for fracture with routine healing: Secondary | ICD-10-CM

## 2021-03-04 DIAGNOSIS — S42292A Other displaced fracture of upper end of left humerus, initial encounter for closed fracture: Secondary | ICD-10-CM

## 2021-03-04 NOTE — Progress Notes (Signed)
Orthopaedic Clinic Return  Assessment: Maria Parrish is a 75 y.o. female with the following: Minimally displaced left proximal humerus fracture  Plan: Repeat radiographs obtained in clinic today demonstrates no interval displacement.  I think that she is healing well.  Injury was sustained less than 3 weeks ago.  We will keep her in a sling for at least 2-3 more weeks.  We will see her back at that time, repeat x-rays, and discuss further immobilization versus initiating some gentle range of motion.  Pain is well controlled.  Follow-up in a couple of weeks.   Follow-up: Return for 2-3 weeks.   Subjective:  Chief Complaint  Patient presents with   Shoulder Injury    Left humeral head fracture 02/16/21    History of Present Illness: Maria Parrish is a 75 y.o. female who returns to clinic for repeat evaluation of her left shoulder.  She sustained a fall 2-3 weeks ago.  She has previously been seen in clinic for left proximal humerus fracture.  She is remained in a sling.  Her pain is controlled with Tylenol.  She does note some difficulty sleeping, or if she moves her shoulder the wrong way.  Otherwise, she is improving.  Review of Systems: No fevers or chills No numbness or tingling No chest pain No shortness of breath No bowel or bladder dysfunction No GI distress No headaches   Objective: There were no vitals taken for this visit.  Physical Exam:  Elderly female.  No acute distress.  Alert and oriented.  Left arm remains in the sling.  Fingers warm and well-perfused.  Minimal bruising over the anterior shoulder and arm.  Sensation is intact in the axillary patch.  Sensation intact in the superficial radial, ulnar and median nerve distribution.  IMAGING: I personally ordered and reviewed the following images:  X-rays of the left shoulder were obtained in clinic, and compared to previous x-rays.  There is no interval displacement of the minimally displaced proximal humerus  fracture.  Humeral joint remains reduced.  Impression: Healing left proximal humerus fracture, minimally displaced  Mordecai Rasmussen, MD 03/04/2021 1:39 PM

## 2021-03-15 ENCOUNTER — Other Ambulatory Visit: Payer: Self-pay

## 2021-03-15 ENCOUNTER — Encounter (HOSPITAL_COMMUNITY)
Admission: RE | Admit: 2021-03-15 | Discharge: 2021-03-15 | Disposition: A | Discharge status: Home / Self Care | Payer: Medicare HMO | Source: Ambulatory Visit | Attending: Nephrology | Admitting: Nephrology

## 2021-03-15 DIAGNOSIS — N184 Chronic kidney disease, stage 4 (severe): Secondary | ICD-10-CM | POA: Diagnosis not present

## 2021-03-15 LAB — CBC
HCT: 28.4 % — ABNORMAL LOW (ref 36.0–46.0)
Hemoglobin: 9.1 g/dL — ABNORMAL LOW (ref 12.0–15.0)
MCH: 30.6 pg (ref 26.0–34.0)
MCHC: 32 g/dL (ref 30.0–36.0)
MCV: 95.6 fL (ref 80.0–100.0)
Platelets: 308 10*3/uL (ref 150–400)
RBC: 2.97 MIL/uL — ABNORMAL LOW (ref 3.87–5.11)
RDW: 14 % (ref 11.5–15.5)
WBC: 7.9 10*3/uL (ref 4.0–10.5)
nRBC: 0 % (ref 0.0–0.2)

## 2021-03-15 LAB — RENAL FUNCTION PANEL
Albumin: 3.4 g/dL — ABNORMAL LOW (ref 3.5–5.0)
Anion gap: 9 (ref 5–15)
BUN: 57 mg/dL — ABNORMAL HIGH (ref 8–23)
CO2: 21 mmol/L — ABNORMAL LOW (ref 22–32)
Calcium: 8.9 mg/dL (ref 8.9–10.3)
Chloride: 108 mmol/L (ref 98–111)
Creatinine, Ser: 3.05 mg/dL — ABNORMAL HIGH (ref 0.44–1.00)
GFR, Estimated: 16 mL/min — ABNORMAL LOW (ref >60)
Glucose, Bld: 148 mg/dL — ABNORMAL HIGH (ref 70–99)
Phosphorus: 4.2 mg/dL (ref 2.5–4.6)
Potassium: 3.7 mmol/L (ref 3.5–5.1)
Sodium: 138 mmol/L (ref 135–145)

## 2021-03-15 LAB — POCT HEMOGLOBIN-HEMACUE: Hemoglobin: 9.2 g/dL — ABNORMAL LOW (ref 12.0–15.0)

## 2021-03-15 MED ORDER — EPOETIN ALFA-EPBX 3000 UNIT/ML IJ SOLN
INTRAMUSCULAR | Status: AC
  Filled 2021-03-15: qty 1

## 2021-03-15 MED ORDER — EPOETIN ALFA-EPBX 3000 UNIT/ML IJ SOLN
3000.0000 [IU] | Freq: Once | INTRAMUSCULAR | Status: AC
  Administered 2021-03-15: 3000 [IU] via SUBCUTANEOUS

## 2021-03-18 ENCOUNTER — Ambulatory Visit: Payer: Medicare HMO | Admitting: Cardiology

## 2021-03-23 ENCOUNTER — Encounter: Payer: Self-pay | Admitting: Orthopedic Surgery

## 2021-03-23 ENCOUNTER — Ambulatory Visit: Payer: Medicare HMO

## 2021-03-23 ENCOUNTER — Other Ambulatory Visit: Payer: Self-pay

## 2021-03-23 ENCOUNTER — Ambulatory Visit (INDEPENDENT_AMBULATORY_CARE_PROVIDER_SITE_OTHER): Payer: Medicare HMO | Admitting: Orthopedic Surgery

## 2021-03-23 VITALS — BP 165/84 | HR 72 | Ht 64.0 in | Wt 150.0 lb

## 2021-03-23 DIAGNOSIS — S42292D Other displaced fracture of upper end of left humerus, subsequent encounter for fracture with routine healing: Secondary | ICD-10-CM

## 2021-03-23 NOTE — Progress Notes (Signed)
Orthopaedic Clinic Return  Assessment: Maria Parrish is a 75 y.o. female with the following: Minimally displaced left proximal humerus fracture  Plan: Radiographs were obtained today which demonstrates stable alignment.  There is been no interval displacement.  Injury was sustained a month ago.  I have encouraged her to start coming out of the sling when at home, and she can work on elbow, wrist and finger range of motion.  She can also start working on pendulum exercises.  Limited shoulder motion at this time.  We will see her back in 2-3 weeks for repeat evaluation.  At that point, anticipate that we will be able to increase her passive range of motion of the left shoulder.  All questions were answered and she is amenable to this plan   Follow-up: Return for 2-3 weeks.   Subjective:  Chief Complaint  Patient presents with   Follow-up    Recheck on left shoulder fracture    History of Present Illness: Maria Parrish is a 75 y.o. female who returns to clinic for repeat evaluation of her left shoulder.  She sustained an injury approximately 4-5 weeks ago.  Her left shoulder pain continues to improve.  She tolerates more gentle range of motion.  It is painful if she bumps it, but otherwise she is doing well.  She has started coming out of the sling a little bit.  She takes Tylenol as needed.  Review of Systems: No fevers or chills No numbness or tingling No chest pain No shortness of breath No bowel or bladder dysfunction No GI distress No headaches   Objective: BP (!) 165/84   Pulse 72   Ht '5\' 4"'$  (1.626 m)   Wt 150 lb (68 kg)   BMI 25.75 kg/m   Physical Exam:  Elderly female.  No acute distress.  Alert and oriented.  Left arm remains in the sling.  Fingers warm and well-perfused.  Sensation is intact in the axillary patch.  Sensation intact in the superficial radial, ulnar and median nerve distribution.  IMAGING: I personally ordered and reviewed the following  images:  X-rays of the left shoulder were obtained in clinic today and compared to previous x-rays.  She has a minimally displaced fracture of the surgical neck of the proximal humerus.  This is stable compared to previous x-rays.  There has been no interval displacement.  The shoulder remains reduced.  Impression: Left proximal humerus fracture in stable alignment.  Mordecai Rasmussen, MD 03/23/2021 10:57 PM

## 2021-03-23 NOTE — Patient Instructions (Signed)
Ok to come out of sling when sitting, at home  Recommend sling at all times when walking or going out of the house  Ok to start working on elbow, wrist and hand ROM  Ok to start with some pendulum exercises

## 2021-03-29 ENCOUNTER — Other Ambulatory Visit: Payer: Self-pay

## 2021-03-29 ENCOUNTER — Encounter (HOSPITAL_COMMUNITY)
Admission: RE | Admit: 2021-03-29 | Discharge: 2021-03-29 | Disposition: A | Discharge status: Home / Self Care | Payer: Medicare HMO | Source: Ambulatory Visit | Attending: Nephrology | Admitting: Nephrology

## 2021-03-29 DIAGNOSIS — N184 Chronic kidney disease, stage 4 (severe): Secondary | ICD-10-CM | POA: Diagnosis not present

## 2021-03-29 DIAGNOSIS — D631 Anemia in chronic kidney disease: Secondary | ICD-10-CM | POA: Diagnosis not present

## 2021-03-29 MED ORDER — EPOETIN ALFA-EPBX 3000 UNIT/ML IJ SOLN: 3000.0000 [IU] | Freq: Once | INTRAMUSCULAR | Status: DC

## 2021-03-30 LAB — POCT HEMOGLOBIN-HEMACUE: Hemoglobin: 11.1 g/dL — ABNORMAL LOW (ref 12.0–15.0)

## 2021-04-13 ENCOUNTER — Ambulatory Visit (INDEPENDENT_AMBULATORY_CARE_PROVIDER_SITE_OTHER): Payer: Medicare HMO

## 2021-04-13 ENCOUNTER — Encounter (HOSPITAL_COMMUNITY): Payer: Self-pay

## 2021-04-13 ENCOUNTER — Ambulatory Visit (INDEPENDENT_AMBULATORY_CARE_PROVIDER_SITE_OTHER): Payer: Medicare HMO | Admitting: Orthopedic Surgery

## 2021-04-13 ENCOUNTER — Encounter: Payer: Self-pay | Admitting: Orthopedic Surgery

## 2021-04-13 ENCOUNTER — Encounter (HOSPITAL_COMMUNITY)
Admission: RE | Admit: 2021-04-13 | Discharge: 2021-04-13 | Disposition: A | Discharge status: Home / Self Care | Payer: Medicare HMO | Source: Ambulatory Visit | Attending: Nephrology | Admitting: Nephrology

## 2021-04-13 ENCOUNTER — Other Ambulatory Visit: Payer: Self-pay

## 2021-04-13 DIAGNOSIS — S42292D Other displaced fracture of upper end of left humerus, subsequent encounter for fracture with routine healing: Secondary | ICD-10-CM

## 2021-04-13 DIAGNOSIS — N184 Chronic kidney disease, stage 4 (severe): Secondary | ICD-10-CM | POA: Diagnosis not present

## 2021-04-13 LAB — PROTEIN / CREATININE RATIO, URINE
Creatinine, Urine: 73.6 mg/dL
Protein Creatinine Ratio: 2.13 mg/mg{Cre} — ABNORMAL HIGH (ref 0.00–0.15)
Total Protein, Urine: 157 mg/dL

## 2021-04-13 LAB — RENAL FUNCTION PANEL
Albumin: 3.8 g/dL (ref 3.5–5.0)
Anion gap: 8 (ref 5–15)
BUN: 73 mg/dL — ABNORMAL HIGH (ref 8–23)
CO2: 25 mmol/L (ref 22–32)
Calcium: 9.9 mg/dL (ref 8.9–10.3)
Chloride: 105 mmol/L (ref 98–111)
Creatinine, Ser: 3.69 mg/dL — ABNORMAL HIGH (ref 0.44–1.00)
GFR, Estimated: 12 mL/min — ABNORMAL LOW (ref >60)
Glucose, Bld: 149 mg/dL — ABNORMAL HIGH (ref 70–99)
Phosphorus: 4 mg/dL (ref 2.5–4.6)
Potassium: 4.1 mmol/L (ref 3.5–5.1)
Sodium: 138 mmol/L (ref 135–145)

## 2021-04-13 LAB — IRON AND TIBC
Iron: 51 ug/dL (ref 28–170)
Saturation Ratios: 21 % (ref 10.4–31.8)
TIBC: 241 ug/dL — ABNORMAL LOW (ref 250–450)
UIBC: 190 ug/dL

## 2021-04-13 LAB — CBC WITH DIFFERENTIAL/PLATELET
Abs Immature Granulocytes: 0.05 10*3/uL (ref 0.00–0.07)
Basophils Absolute: 0 10*3/uL (ref 0.0–0.1)
Basophils Relative: 0 %
Eosinophils Absolute: 0.3 10*3/uL (ref 0.0–0.5)
Eosinophils Relative: 3 %
HCT: 31.3 % — ABNORMAL LOW (ref 36.0–46.0)
Hemoglobin: 10 g/dL — ABNORMAL LOW (ref 12.0–15.0)
Immature Granulocytes: 1 %
Lymphocytes Relative: 15 %
Lymphs Abs: 1.4 10*3/uL (ref 0.7–4.0)
MCH: 29.2 pg (ref 26.0–34.0)
MCHC: 31.9 g/dL (ref 30.0–36.0)
MCV: 91.5 fL (ref 80.0–100.0)
Monocytes Absolute: 0.5 10*3/uL (ref 0.1–1.0)
Monocytes Relative: 5 %
Neutro Abs: 7.3 10*3/uL (ref 1.7–7.7)
Neutrophils Relative %: 76 %
Platelets: 320 10*3/uL (ref 150–400)
RBC: 3.42 MIL/uL — ABNORMAL LOW (ref 3.87–5.11)
RDW: 13.4 % (ref 11.5–15.5)
WBC: 9.6 10*3/uL (ref 4.0–10.5)
nRBC: 0 % (ref 0.0–0.2)

## 2021-04-13 LAB — POCT HEMOGLOBIN-HEMACUE: Hemoglobin: 10.3 g/dL — ABNORMAL LOW (ref 12.0–15.0)

## 2021-04-13 LAB — FERRITIN: Ferritin: 261 ng/mL (ref 11–307)

## 2021-04-13 MED ORDER — EPOETIN ALFA-EPBX 3000 UNIT/ML IJ SOLN: 3000.0000 [IU] | Freq: Once | INTRAMUSCULAR | Status: DC

## 2021-04-13 NOTE — Progress Notes (Signed)
Orthopaedic Clinic Return  Assessment: Maria Parrish is a 75 y.o. female with the following: Minimally displaced left proximal humerus fracture  Plan: Radiographs stable.  Patient denies pain.  She already has active motion of the left shoulder above her head.  She can continue to advance her ROM and strengthening as needed.  She is comfortable following up only as needed.    Follow-up: Return if symptoms worsen or fail to improve.   Subjective:  Chief Complaint  Patient presents with   Fracture    DOI 02/16/21 - Minimally displaced left proximal humerus fracture- here for f/u.  Doing well, removes sling occasionally and moves arm. No meds.     History of Present Illness: Maria Parrish is a 75 y.o. female who returns to clinic for repeat evaluation of her left shoulder.  She sustained the injury approximately 2 months ago.  No pain in her shoulder.  She is coming out of the sling frequently.  She lift her arm above her head.  No concerns at this time.   Review of Systems: No fevers or chills No numbness or tingling No chest pain No shortness of breath No bowel or bladder dysfunction No GI distress No headaches   Objective: There were no vitals taken for this visit.  Physical Exam:  Elderly female.  No acute distress.  Alert and oriented.  Active FF to 100.  IR to lumbar spine.  Abduction to 90.  Fingers warm and well-perfused.  Sensation is intact in the axillary patch.  Sensation intact in the superficial radial, ulnar and median nerve distribution.  IMAGING: I personally ordered and reviewed the following images:  X-rays of the left shoulder were obtained in clinic today and compared to previous x-rays.  Minimally displaced fracture of the surgical neck of the proximal humerus.  There has been no interval displacement.  The shoulder remains reduced.  Impression: Left proximal humerus fracture in stable alignment.  Unchanged from prior XR  Mordecai Rasmussen,  MD 04/13/2021 11:20 PM

## 2021-04-27 ENCOUNTER — Encounter (HOSPITAL_COMMUNITY): Payer: Self-pay

## 2021-04-27 ENCOUNTER — Encounter (HOSPITAL_COMMUNITY)
Admission: RE | Admit: 2021-04-27 | Discharge: 2021-04-27 | Disposition: A | Discharge status: Home / Self Care | Payer: Medicare HMO | Source: Ambulatory Visit | Attending: Nephrology | Admitting: Nephrology

## 2021-04-27 DIAGNOSIS — N184 Chronic kidney disease, stage 4 (severe): Secondary | ICD-10-CM | POA: Insufficient documentation

## 2021-04-27 DIAGNOSIS — D631 Anemia in chronic kidney disease: Secondary | ICD-10-CM | POA: Insufficient documentation

## 2021-04-27 LAB — POCT HEMOGLOBIN-HEMACUE: Hemoglobin: 10.1 g/dL — ABNORMAL LOW (ref 12.0–15.0)

## 2021-04-27 MED ORDER — EPOETIN ALFA-EPBX 3000 UNIT/ML IJ SOLN: 3000.0000 [IU] | Freq: Once | INTRAMUSCULAR | Status: DC

## 2021-05-10 NOTE — Progress Notes (Signed)
Cardiology Office Note    Date:  05/11/2021   ID:  AYSEL GILCHREST, DOB 08/10/1945, MRN [id number]  PCP:  Vidal Schwalbe, MD  Cardiologist: Carlyle Dolly, MD    Chief Complaint  Patient presents with   Follow-up    4 month visit    History of Present Illness:    Maria Parrish is a 75 y.o. 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   She was examined by myself in 11/2020 and was living with her son but reported remaining active at baseline and was walking her dog multiple times per day without any anginal symptoms. Lopressor was transitioned to Toprol-XL 25 mg daily due to her cardiomyopathy and she had been unable to take Hydralazine as TID dosing, therefore this was transitioned to 100 mg twice daily along with her remaining on Imdur 30 mg daily and Lasix 80mg  BID per Nephrology.   In talking with the patient and her son today, she reports overall doing well since her last visit. She is mostly at home but does go to the grocery store frequently and uses her cane. She denies any recent chest pain or dyspnea on exertion. No recent orthopnea, PND or pitting edema. Nephrology did reduce her Lasix from 80mg  BID to 40mg  BID in the interim. She does check her BP at home and it has been well-controlled.    Past Medical History:  Diagnosis Date   Assistance needed for mobility    walks with walker   Confusion    Dementia ([place])    Diabetes mellitus without complication (HCC)    Generalized weakness    Hypercalcemia    Hyperlipidemia    Hyperparathyroidism ([place])    Hypertension    Hypertension    Moderate protein-calorie malnutrition ([place])    Peripheral neuropathy    UTI (lower urinary tract infection)     Past Surgical History:  Procedure Laterality Date   COLONOSCOPY N/A 08/12/2015   Procedure: COLONOSCOPY;  Surgeon: Daneil Dolin, MD;  Location: [facility];  Service: Endoscopy;  Laterality: N/A;  1245pm   ESOPHAGOGASTRODUODENOSCOPY N/A 08/12/2015   Procedure: ESOPHAGOGASTRODUODENOSCOPY (EGD);  Surgeon: Daneil Dolin, MD;  Location: [facility];  Service: Endoscopy;  Laterality: N/A;   none      Current Medications: Outpatient Medications Prior to Visit  Medication Sig Dispense Refill   amLODipine (NORVASC) 5 MG tablet Take by mouth daily.     aspirin EC 81 MG tablet Take 81 mg by mouth daily.     calcitRIOL (ROCALTROL) 0.25 MCG capsule Take 1 capsule (0.25 mcg total) by mouth daily. 90 capsule 1   Cyanocobalamin (VITAMIN B-12 PO) Take 1 tablet by mouth daily.     ezetimibe (ZETIA) 10 MG tablet daily.  2   FEROSUL 325 (65 Fe) MG tablet Take 325 mg by mouth 2 (two) times daily.     furosemide (LASIX) 80 MG tablet Take 40 mg by mouth 2 (two) times daily.     hydrALAZINE (APRESOLINE) 100 MG tablet Take 1 tablet (100 mg total) by mouth 2 (two) times daily. 180 tablet 3   insulin degludec (TRESIBA FLEXTOUCH) 100 UNIT/ML FlexTouch Pen Inject 25 Units into the skin at bedtime. 15 mL 1   isosorbide mononitrate (IMDUR) 30 MG 24 hr tablet Take 1 tablet (30 mg total) by mouth daily. [place]  tablet 1   metoprolol succinate (TOPROL XL) 25 MG 24 hr tablet Take 1 tablet (25 mg total) by mouth daily. 90 tablet 3   potassium chloride (KLOR-CON) 10 MEQ tablet Take 10 mEq by mouth daily.     sodium bicarbonate 650 MG tablet See admin instructions.     gabapentin (NEURONTIN) 300 MG capsule Take 300 mg by mouth 2 (two) times daily. (Patient not taking: Reported on 05/11/2021)     furosemide (LASIX) 80 MG tablet Take 80 mg by mouth 2 (two) times daily.     Facility-Administered Medications Prior to Visit  Medication Dose Route Frequency Provider Last Rate Last Admin   epoetin alfa-epbx (RETACRIT) 3000 UNIT/ML injection              Allergies:   Penicillins, Statins, and Statins support [acid blockers support]   Social History   Socioeconomic History    Marital status: Widowed    Spouse name: Not on file   Number of children: 2   Years of education: Not on file   Highest education level: Not on file  Occupational History   Occupation: fabricator for [employer]  Tobacco Use   Smoking status: Never   Smokeless tobacco: Never  Vaping Use   Vaping Use: Never used  Substance and Sexual Activity   Alcohol use: No   Drug use: No   Sexual activity: Not Currently  Other Topics Concern   Not on file  Social History Narrative   Not on file   Social Determinants of Health   Financial Resource Strain: Not on file  Food Insecurity: Not on file  Transportation Needs: No Transportation Needs   Lack of Transportation (Medical): No   Lack of Transportation (Non-Medical): No  Physical Activity: Inactive   Days of Exercise per Week: 0 days   Minutes of Exercise per Session: 0 min  Stress: Not on file  Social Connections: Not on file     Family History:  The patient's family history includes Diabetes in her brother and father; Heart attack in her mother; Hypertension in her brother and brother.   Review of Systems:    Please see the history of present illness.     All other systems reviewed and are otherwise negative except as noted above.   Physical Exam:    VS:  BP 138/78   Pulse 72   Ht 5\' 4"  (1.626 m)   Wt 153 lb 3.2 oz (69.5 kg)   SpO2 97%   BMI 26.30 kg/m    General: Well developed, well nourished,female appearing in no acute distress. Head: Normocephalic, atraumatic. Neck: No carotid bruits. JVD not elevated.  Lungs: Respirations regular and unlabored, without wheezes or rales.  Heart: Regular rate and rhythm. No S3 or S4.  No murmur, no rubs, or gallops appreciated. Abdomen: Appears non-distended. No obvious abdominal masses. Msk:  Strength and tone appear normal for age. No obvious joint deformities or effusions. Extremities: No clubbing or cyanosis. No pitting edema.  Distal pedal pulses are 2+ bilaterally. Neuro: Alert and  oriented X 3. Moves all extremities spontaneously. No focal deficits noted. Psych:  Responds to questions appropriately with a normal affect. Skin: No rashes or lesions noted  Wt Readings from Last 3 Encounters:  05/11/21 153 lb 3.2 oz (69.5 kg)  04/27/21 150 lb (68 kg)  04/13/21 150 lb (68 kg)     Studies/Labs Reviewed:   EKG:  EKG is not ordered today.    Recent Labs: 05/12/2020: B Natriuretic  Peptide 1,352.0 05/14/2020: Magnesium 2.2 08/09/2020: ALT 30; TSH 3.100 05/11/2021: BUN 58; Creatinine, Ser 3.38; Hemoglobin 9.7; Platelets 329; Potassium 3.8; Sodium 136   Lipid Panel    Component Value Date/Time   CHOL 173 09/23/2015 0537   TRIG 96 09/23/2015 0537   HDL 36 (L) 09/23/2015 0537   CHOLHDL 4.8 09/23/2015 0537   VLDL 19 09/23/2015 0537   LDLCALC 118 (H) 09/23/2015 0537    Additional studies/ records that were reviewed today include:   Echocardiogram: 04/2020 IMPRESSIONS     1. Left ventricular ejection fraction, by estimation, is 40 to 45%. The  left ventricle has mildly decreased function. The left ventricle  demonstrates global hypokinesis. The left ventricular internal cavity size  was mildly dilated. There is mild left  ventricular hypertrophy. Left ventricular diastolic parameters are  consistent with Grade II diastolic dysfunction (pseudonormalization).  Elevated left atrial pressure.   2. Right ventricular systolic function is mildly reduced. The right  ventricular size is normal. There is moderately elevated pulmonary artery  systolic pressure. The estimated right ventricular systolic pressure is  53.6 mmHg.   3. The mitral valve is grossly normal. Mild to moderate mitral valve  regurgitation.   4. The aortic valve is tricuspid. Aortic valve regurgitation is not  visualized. Mild aortic valve sclerosis is present, with no evidence of  aortic valve stenosis.   5. The inferior vena cava is dilated in size with >50% respiratory  variability, suggesting  right atrial pressure of 8 mmHg.   Assessment:    1. 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 Cardiomyopathy, unspecified type ([place])   3. Essential hypertension   4. Mixed hyperlipidemia   5. CKD (chronic kidney disease) stage 4, GFR 15-29 ml/min (HCC)      Plan:   In order of problems listed above:  1. HFmrEF - Her volume status has been stable and she appears euvolemic on examination today. Given the time-frame since her last echocardiogram, will order a repeat echo for reassessment as her EF was at 40-45% in 04/2020. - Continue Hydralazine 100mg  BID, Imdur 30mg  daily and Toprol-XL 25mg  daily. Will defer diuretic dosing to Nephrology and she currently remains on Lasix 40mg  BID. Not a candidate for ACE-I/ARB/ARNI/Spiro/SGLT2 inhibitor given her CKD.   2. HTN - Her BP is well-controlled at 138/78 during today's visit. Continue current medication regimen for now with Amlodipine, Imdur, Toprol-XL and Hydralazine.   3. HLD - She has been intolerant to multiple statins in the past and remains on Zetia 10mg  daily. Followed by her PCP.   4. Stage 4 CKD - Her creatinine was at 3.69 when checked in 03/2021. Followed by Dr. Theador Hawthorne.    Medication Adjustments/Labs and Tests Ordered: Current medicines are reviewed at length with the patient today.  Concerns regarding medicines are outlined above.  Medication changes, Labs and Tests ordered today are listed in the Patient Instructions below. Patient Instructions  Medication Instructions:  Your physician recommends that you continue on your current medications as directed. Please refer to the Current Medication list given to you today.  *If you need a refill on your cardiac medications before your next appointment, please call your pharmacy*   Lab Work: NONE   If you have labs (blood work) drawn today and your tests are completely normal, you will receive your results only by: [place] (if you have MyChart) OR A paper  copy in the mail If you have any lab test that is abnormal or  we need to change your treatment, we will call you to review the results.   Testing/Procedures: Your physician has requested that you have an echocardiogram. Echocardiography is a painless test that uses sound waves to create images of your heart. It provides your doctor with information about the size and shape of your heart and how well your heart's chambers and valves are working. This procedure takes approximately one hour. There are no restrictions for this procedure.    Follow-Up: At [facility], you and your health needs are our priority.  As part of our continuing mission to provide you with exceptional heart care, we have created designated Provider Care Teams.  These Care Teams include your primary Cardiologist (physician) and Advanced Practice Providers (APPs -  Physician Assistants and Nurse Practitioners) who all work together to provide you with the care you need, when you need it.  We recommend signing up for the patient portal called "MyChart".  Sign up information is provided on this After Visit Summary.  MyChart is used to connect with patients for Virtual Visits (Telemedicine).  Patients are able to view lab/test results, encounter notes, upcoming appointments, etc.  Non-urgent messages can be sent to your provider as well.   To learn more about what you can do with MyChart, go to [email or web address].    Your next appointment:   4-5 month(s)  The format for your next appointment:   In Person  Provider:   Carlyle Dolly, MD   Other Instructions Thank you for choosing [facility]!     Signed, Erma Heritage, PA-C  05/11/2021 7:21 PM    [place] [address] [place],  [place] [address] Phone: [phone] Fax: [phone]

## 2021-05-11 ENCOUNTER — Other Ambulatory Visit: Payer: Self-pay

## 2021-05-11 ENCOUNTER — Encounter: Payer: Self-pay | Admitting: Student

## 2021-05-11 ENCOUNTER — Encounter (HOSPITAL_COMMUNITY)
Admission: RE | Admit: 2021-05-11 | Discharge: 2021-05-11 | Disposition: A | Discharge status: Home / Self Care | Payer: Medicare HMO | Source: Ambulatory Visit | Attending: Nephrology | Admitting: Nephrology

## 2021-05-11 ENCOUNTER — Ambulatory Visit (INDEPENDENT_AMBULATORY_CARE_PROVIDER_SITE_OTHER): Payer: Medicare HMO | Admitting: Student

## 2021-05-11 ENCOUNTER — Ambulatory Visit: Payer: Medicare HMO | Admitting: Cardiology

## 2021-05-11 VITALS — BP 138/78 | HR 72 | Ht 64.0 in | Wt 153.2 lb

## 2021-05-11 DIAGNOSIS — I429 Cardiomyopathy, unspecified: Secondary | ICD-10-CM

## 2021-05-11 DIAGNOSIS — N184 Chronic kidney disease, stage 4 (severe): Secondary | ICD-10-CM

## 2021-05-11 DIAGNOSIS — I5020 Unspecified systolic (congestive) heart failure: Secondary | ICD-10-CM

## 2021-05-11 DIAGNOSIS — I10 Essential (primary) hypertension: Secondary | ICD-10-CM

## 2021-05-11 DIAGNOSIS — E782 Mixed hyperlipidemia: Secondary | ICD-10-CM | POA: Diagnosis not present

## 2021-05-11 LAB — CBC WITH DIFFERENTIAL/PLATELET
Abs Immature Granulocytes: 0.02 10*3/uL (ref 0.00–0.07)
Basophils Absolute: 0 10*3/uL (ref 0.0–0.1)
Basophils Relative: 1 %
Eosinophils Absolute: 0.2 10*3/uL (ref 0.0–0.5)
Eosinophils Relative: 3 %
HCT: 28.5 % — ABNORMAL LOW (ref 36.0–46.0)
Hemoglobin: 9.7 g/dL — ABNORMAL LOW (ref 12.0–15.0)
Immature Granulocytes: 0 %
Lymphocytes Relative: 18 %
Lymphs Abs: 1.4 10*3/uL (ref 0.7–4.0)
MCH: 30.2 pg (ref 26.0–34.0)
MCHC: 34 g/dL (ref 30.0–36.0)
MCV: 88.8 fL (ref 80.0–100.0)
Monocytes Absolute: 0.4 10*3/uL (ref 0.1–1.0)
Monocytes Relative: 4 %
Neutro Abs: 6 10*3/uL (ref 1.7–7.7)
Neutrophils Relative %: 74 %
Platelets: 329 10*3/uL (ref 150–400)
RBC: 3.21 MIL/uL — ABNORMAL LOW (ref 3.87–5.11)
RDW: 13.2 % (ref 11.5–15.5)
WBC: 8.1 10*3/uL (ref 4.0–10.5)
nRBC: 0 % (ref 0.0–0.2)

## 2021-05-11 LAB — RENAL FUNCTION PANEL
Albumin: 3.5 g/dL (ref 3.5–5.0)
Anion gap: 9 (ref 5–15)
BUN: 58 mg/dL — ABNORMAL HIGH (ref 8–23)
CO2: 23 mmol/L (ref 22–32)
Calcium: 9.6 mg/dL (ref 8.9–10.3)
Chloride: 104 mmol/L (ref 98–111)
Creatinine, Ser: 3.38 mg/dL — ABNORMAL HIGH (ref 0.44–1.00)
GFR, Estimated: 14 mL/min — ABNORMAL LOW (ref >60)
Glucose, Bld: 275 mg/dL — ABNORMAL HIGH (ref 70–99)
Phosphorus: 3.3 mg/dL (ref 2.5–4.6)
Potassium: 3.8 mmol/L (ref 3.5–5.1)
Sodium: 136 mmol/L (ref 135–145)

## 2021-05-11 LAB — POCT HEMOGLOBIN-HEMACUE: Hemoglobin: 9.7 g/dL — ABNORMAL LOW (ref 12.0–15.0)

## 2021-05-11 MED ORDER — EPOETIN ALFA-EPBX 3000 UNIT/ML IJ SOLN
INTRAMUSCULAR | Status: AC
  Filled 2021-05-11: qty 1

## 2021-05-11 MED ORDER — EPOETIN ALFA-EPBX 3000 UNIT/ML IJ SOLN
3000.0000 [IU] | Freq: Once | INTRAMUSCULAR | Status: AC
  Administered 2021-05-11: 3000 [IU] via SUBCUTANEOUS

## 2021-05-11 NOTE — Patient Instructions (Signed)
Medication Instructions:  Your physician recommends that you continue on your current medications as directed. Please refer to the Current Medication list given to you today.  *If you need a refill on your cardiac medications before your next appointment, please call your pharmacy*   Lab Work: NONE   If you have labs (blood work) drawn today and your tests are completely normal, you will receive your results only by: [place] (if you have MyChart) OR A paper copy in the mail If you have any lab test that is abnormal or we need to change your treatment, we will call you to review the results.   Testing/Procedures: Your physician has requested that you have an echocardiogram. Echocardiography is a painless test that uses sound waves to create images of your heart. It provides your doctor with information about the size and shape of your heart and how well your heart's chambers and valves are working. This procedure takes approximately one hour. There are no restrictions for this procedure.    Follow-Up: At [facility], you and your health needs are our priority.  As part of our continuing mission to provide you with exceptional heart care, we have created designated Provider Care Teams.  These Care Teams include your primary Cardiologist (physician) and Advanced Practice Providers (APPs -  Physician Assistants and Nurse Practitioners) who all work together to provide you with the care you need, when you need it.  We recommend signing up for the patient portal called "MyChart".  Sign up information is provided on this After Visit Summary.  MyChart is used to connect with patients for Virtual Visits (Telemedicine).  Patients are able to view lab/test results, encounter notes, upcoming appointments, etc.  Non-urgent messages can be sent to your provider as well.   To learn more about what you can do with MyChart, go to [email or web address].    Your next appointment:   4-5  month(s)  The format for your next appointment:   In Person  Provider:   Carlyle Dolly, MD   Other Instructions Thank you for choosing [facility]!

## 2021-05-25 ENCOUNTER — Encounter (HOSPITAL_COMMUNITY)
Admission: RE | Admit: 2021-05-25 | Discharge: 2021-05-25 | Disposition: A | Discharge status: Home / Self Care | Payer: Medicare HMO | Source: Ambulatory Visit | Attending: Nephrology | Admitting: Nephrology

## 2021-05-25 ENCOUNTER — Other Ambulatory Visit: Payer: Self-pay

## 2021-05-25 DIAGNOSIS — N184 Chronic kidney disease, stage 4 (severe): Secondary | ICD-10-CM | POA: Diagnosis not present

## 2021-05-25 DIAGNOSIS — D631 Anemia in chronic kidney disease: Secondary | ICD-10-CM | POA: Diagnosis not present

## 2021-05-25 LAB — POCT HEMOGLOBIN-HEMACUE: Hemoglobin: 9.6 g/dL — ABNORMAL LOW (ref 12.0–15.0)

## 2021-05-25 MED ORDER — EPOETIN ALFA-EPBX 3000 UNIT/ML IJ SOLN
3000.0000 [IU] | Freq: Once | INTRAMUSCULAR | Status: AC
  Administered 2021-05-25: 3000 [IU] via SUBCUTANEOUS
  Filled 2021-05-25: qty 1

## 2021-06-07 ENCOUNTER — Other Ambulatory Visit: Payer: Self-pay

## 2021-06-07 ENCOUNTER — Encounter (HOSPITAL_COMMUNITY)
Admission: RE | Admit: 2021-06-07 | Discharge: 2021-06-07 | Disposition: A | Discharge status: Home / Self Care | Payer: Medicare HMO | Source: Ambulatory Visit | Attending: Nephrology | Admitting: Nephrology

## 2021-06-07 VITALS — BP 132/78 | HR 74 | Temp 98.2°F | Resp 16

## 2021-06-07 DIAGNOSIS — N184 Chronic kidney disease, stage 4 (severe): Secondary | ICD-10-CM

## 2021-06-07 DIAGNOSIS — R7989 Other specified abnormal findings of blood chemistry: Secondary | ICD-10-CM

## 2021-06-07 LAB — RENAL FUNCTION PANEL
Albumin: 3.8 g/dL (ref 3.5–5.0)
Anion gap: 9 (ref 5–15)
BUN: 68 mg/dL — ABNORMAL HIGH (ref 8–23)
CO2: 25 mmol/L (ref 22–32)
Calcium: 9.7 mg/dL (ref 8.9–10.3)
Chloride: 98 mmol/L (ref 98–111)
Creatinine, Ser: 3.61 mg/dL — ABNORMAL HIGH (ref 0.44–1.00)
GFR, Estimated: 13 mL/min — ABNORMAL LOW (ref >60)
Glucose, Bld: 206 mg/dL — ABNORMAL HIGH (ref 70–99)
Phosphorus: 3.6 mg/dL (ref 2.5–4.6)
Potassium: 4 mmol/L (ref 3.5–5.1)
Sodium: 132 mmol/L — ABNORMAL LOW (ref 135–145)

## 2021-06-07 LAB — CBC
HCT: 30.6 % — ABNORMAL LOW (ref 36.0–46.0)
Hemoglobin: 10.4 g/dL — ABNORMAL LOW (ref 12.0–15.0)
MCH: 30.2 pg (ref 26.0–34.0)
MCHC: 34 g/dL (ref 30.0–36.0)
MCV: 89 fL (ref 80.0–100.0)
Platelets: 351 10*3/uL (ref 150–400)
RBC: 3.44 MIL/uL — ABNORMAL LOW (ref 3.87–5.11)
RDW: 13.3 % (ref 11.5–15.5)
WBC: 10.4 10*3/uL (ref 4.0–10.5)
nRBC: 0 % (ref 0.0–0.2)

## 2021-06-07 LAB — POCT HEMOGLOBIN-HEMACUE: Hemoglobin: 9.9 g/dL — ABNORMAL LOW (ref 12.0–15.0)

## 2021-06-07 MED ORDER — EPOETIN ALFA-EPBX 3000 UNIT/ML IJ SOLN
3000.0000 [IU] | Freq: Once | INTRAMUSCULAR | Status: AC
  Administered 2021-06-07: 3000 [IU] via SUBCUTANEOUS

## 2021-06-07 MED ORDER — EPOETIN ALFA-EPBX 3000 UNIT/ML IJ SOLN
INTRAMUSCULAR | Status: AC
  Filled 2021-06-07: qty 1

## 2021-06-08 ENCOUNTER — Encounter (HOSPITAL_COMMUNITY): Payer: Medicare HMO

## 2021-06-21 ENCOUNTER — Other Ambulatory Visit: Payer: Self-pay

## 2021-06-21 ENCOUNTER — Ambulatory Visit (HOSPITAL_COMMUNITY)
Admission: RE | Admit: 2021-06-21 | Discharge: 2021-06-21 | Disposition: A | Discharge status: Home / Self Care | Payer: Medicare HMO | Source: Ambulatory Visit | Attending: Cardiology | Admitting: Cardiology

## 2021-06-21 ENCOUNTER — Encounter (HOSPITAL_COMMUNITY)
Admission: RE | Admit: 2021-06-21 | Discharge: 2021-06-21 | Disposition: A | Discharge status: Home / Self Care | Payer: Medicare HMO | Source: Ambulatory Visit | Attending: Nephrology | Admitting: Nephrology

## 2021-06-21 DIAGNOSIS — F0390 Unspecified dementia without behavioral disturbance: Secondary | ICD-10-CM | POA: Insufficient documentation

## 2021-06-21 DIAGNOSIS — E119 Type 2 diabetes mellitus without complications: Secondary | ICD-10-CM | POA: Diagnosis not present

## 2021-06-21 DIAGNOSIS — I119 Hypertensive heart disease without heart failure: Secondary | ICD-10-CM | POA: Diagnosis not present

## 2021-06-21 DIAGNOSIS — I429 Cardiomyopathy, unspecified: Secondary | ICD-10-CM

## 2021-06-21 DIAGNOSIS — E785 Hyperlipidemia, unspecified: Secondary | ICD-10-CM | POA: Insufficient documentation

## 2021-06-21 DIAGNOSIS — I428 Other cardiomyopathies: Secondary | ICD-10-CM | POA: Diagnosis not present

## 2021-06-21 LAB — ECHOCARDIOGRAM COMPLETE
AR max vel: 1.65 cm2
AV Area VTI: 2.18 cm2
AV Area mean vel: 1.57 cm2
AV Mean grad: 3.3 mmHg
AV Peak grad: 6.4 mmHg
Ao pk vel: 1.27 m/s
Area-P 1/2: 2.45 cm2
S' Lateral: 3.4 cm

## 2021-06-21 LAB — POCT HEMOGLOBIN-HEMACUE: Hemoglobin: 9.9 g/dL — ABNORMAL LOW (ref 12.0–15.0)

## 2021-06-21 MED ORDER — EPOETIN ALFA-EPBX 3000 UNIT/ML IJ SOLN
3000.0000 [IU] | Freq: Once | INTRAMUSCULAR | Status: AC
Start: 2021-06-21 — End: 2021-06-21
  Administered 2021-06-21: 3000 [IU] via SUBCUTANEOUS
  Filled 2021-06-21: qty 1

## 2021-06-21 NOTE — Progress Notes (Signed)
*  PRELIMINARY RESULTS* Echocardiogram 2D Echocardiogram has been performed.  Maria Parrish 06/21/2021, 2:15 PM

## 2021-06-22 ENCOUNTER — Encounter (HOSPITAL_COMMUNITY): Admission: RE | Admit: 2021-06-22 | Payer: Medicare HMO | Source: Ambulatory Visit

## 2021-06-25 LAB — COMPREHENSIVE METABOLIC PANEL
ALT: 12 IU/L (ref 0–32)
AST: 17 IU/L (ref 0–40)
Albumin/Globulin Ratio: 1.1 — ABNORMAL LOW (ref 1.2–2.2)
Albumin: 4 g/dL (ref 3.7–4.7)
Alkaline Phosphatase: 77 IU/L (ref 44–121)
BUN/Creatinine Ratio: 17 (ref 12–28)
BUN: 65 mg/dL — ABNORMAL HIGH (ref 8–27)
Bilirubin Total: 0.3 mg/dL (ref 0.0–1.2)
CO2: 19 mmol/L — ABNORMAL LOW (ref 20–29)
Calcium: 10.1 mg/dL (ref 8.7–10.3)
Chloride: 96 mmol/L (ref 96–106)
Creatinine, Ser: 3.81 mg/dL — ABNORMAL HIGH (ref 0.57–1.00)
Globulin, Total: 3.5 g/dL (ref 1.5–4.5)
Glucose: 293 mg/dL — ABNORMAL HIGH (ref 70–99)
Potassium: 5.2 mmol/L (ref 3.5–5.2)
Sodium: 135 mmol/L (ref 134–144)
Total Protein: 7.5 g/dL (ref 6.0–8.5)
eGFR: 12 mL/min/{1.73_m2} — ABNORMAL LOW (ref >59)

## 2021-06-30 ENCOUNTER — Encounter: Payer: Self-pay | Admitting: "Endocrinology

## 2021-06-30 ENCOUNTER — Ambulatory Visit: Payer: Medicare HMO | Admitting: "Endocrinology

## 2021-06-30 ENCOUNTER — Other Ambulatory Visit: Payer: Self-pay

## 2021-06-30 ENCOUNTER — Ambulatory Visit (INDEPENDENT_AMBULATORY_CARE_PROVIDER_SITE_OTHER): Payer: Medicare HMO | Admitting: "Endocrinology

## 2021-06-30 VITALS — BP 158/78 | HR 76 | Ht 64.0 in | Wt 151.8 lb

## 2021-06-30 DIAGNOSIS — E119 Type 2 diabetes mellitus without complications: Secondary | ICD-10-CM | POA: Diagnosis not present

## 2021-06-30 DIAGNOSIS — E782 Mixed hyperlipidemia: Secondary | ICD-10-CM | POA: Diagnosis not present

## 2021-06-30 DIAGNOSIS — I10 Essential (primary) hypertension: Secondary | ICD-10-CM

## 2021-06-30 DIAGNOSIS — Z794 Long term (current) use of insulin: Secondary | ICD-10-CM

## 2021-06-30 LAB — POCT GLYCOSYLATED HEMOGLOBIN (HGB A1C): HbA1c, POC (controlled diabetic range): 8.3 % — AB (ref 0.0–7.0)

## 2021-06-30 MED ORDER — TRESIBA FLEXTOUCH 100 UNIT/ML ~~LOC~~ SOPN: 25.0000 [IU] | PEN_INJECTOR | Freq: Every day | SUBCUTANEOUS | 1 refills | Status: DC

## 2021-06-30 NOTE — Progress Notes (Signed)
06/30/2021  Endocrinology follow-up note  Subjective:    Patient ID: Maria Parrish, female    DOB: Sep 28, 1945, PCP Vidal Schwalbe, MD   Past Medical History:  Diagnosis Date   Assistance needed for mobility    walks with walker   Confusion    Dementia ([place])    Diabetes mellitus without complication ([place])    Generalized weakness    Hypercalcemia    Hyperlipidemia    Hyperparathyroidism ([place])    Hypertension    Hypertension    Moderate protein-calorie malnutrition ([place])    Peripheral neuropathy    UTI (lower urinary tract infection)    Past Surgical History:  Procedure Laterality Date   COLONOSCOPY N/A 08/12/2015   Procedure: COLONOSCOPY;  Surgeon: Daneil Dolin, MD;  [facility];  Service: Endoscopy;  Laterality: N/A;  1245pm   ESOPHAGOGASTRODUODENOSCOPY N/A 08/12/2015   Procedure: ESOPHAGOGASTRODUODENOSCOPY (EGD);  Surgeon: Daneil Dolin, MD;  Location: [facility];  Service: Endoscopy;  Laterality: N/A;   none     Social History   Socioeconomic History   Marital status: Widowed    Spouse name: Not on file   Number of children: 2   Years of education: Not on file   Highest education level: Not on file  Occupational History   Occupation: fabricator for [employer]  Tobacco Use   Smoking status: Never   Smokeless tobacco: Never  Vaping Use   Vaping Use: Never used  Substance and Sexual Activity   Alcohol use: No   Drug use: No   Sexual activity: Not Currently  Other Topics Concern   Not on file  Social History Narrative   Not on file   Social Determinants of Health   Financial Resource Strain: Not on file  Food Insecurity: Not on file  Transportation Needs: No Transportation Needs   Lack of Transportation (Medical): No   Lack of Transportation (Non-Medical): No  Physical Activity: Inactive   Days of Exercise per Week: 0 days   Minutes of Exercise per Session: 0 min  Stress: Not on file  Social Connections: Not on file   Outpatient Encounter  Medications as of 06/30/2021  Medication Sig   amLODipine (NORVASC) 5 MG tablet Take by mouth daily.   aspirin EC 81 MG tablet Take 81 mg by mouth daily.   calcitRIOL (ROCALTROL) 0.25 MCG capsule Take 1 capsule (0.25 mcg total) by mouth daily.   Cyanocobalamin (VITAMIN B-12 PO) Take 1 tablet by mouth daily.   ezetimibe (ZETIA) 10 MG tablet daily.   FEROSUL 325 (65 Fe) MG tablet Take 325 mg by mouth 2 (two) times daily.   furosemide (LASIX) 80 MG tablet Take 40 mg by mouth 2 (two) times daily.   hydrALAZINE (APRESOLINE) 100 MG tablet Take 1 tablet (100 mg total) by mouth 2 (two) times daily.   insulin degludec (TRESIBA FLEXTOUCH) 100 UNIT/ML FlexTouch Pen Inject 25 Units into the skin at bedtime.   isosorbide mononitrate (IMDUR) 30 MG 24 hr tablet Take 1 tablet (30 mg total) by mouth daily.   metoprolol succinate (TOPROL XL) 25 MG 24 hr tablet Take 1 tablet (25 mg total) by mouth daily.   potassium chloride (KLOR-CON) 10 MEQ tablet Take 10 mEq by mouth daily.   sodium bicarbonate 650 MG tablet See admin instructions.   [DISCONTINUED] gabapentin (NEURONTIN) 300 MG capsule Take 300 mg by mouth 2 (two) times daily. (Patient not taking: Reported on 05/11/2021)   [DISCONTINUED] insulin degludec (TRESIBA FLEXTOUCH) 100 UNIT/ML  FlexTouch Pen Inject 25 Units into the skin at bedtime. (Patient taking differently: Inject 20 Units into the skin daily.)   No facility-administered encounter medications on file as of 06/30/2021.   ALLERGIES: Allergies  Allergen Reactions   Penicillins Other (See Comments)    Makes patient sore.  Has patient had a PCN reaction causing immediate rash, facial/tongue/throat swelling, SOB or lightheadedness with hypotension: No Has patient had a PCN reaction causing severe rash involving mucus membranes or skin necrosis: No Has patient had a PCN reaction that required hospitalization No Has patient had a PCN reaction occurring within the last 10 years: No If all of the  above answers are "NO", then may proceed with Cephalosporin use.    Statins Other (See Comments)    Makes wrists and fingers ache Other reaction(s): Muscle Pain   Statins Support [Acid Blockers Support]     Other reaction(s): joint pain   VACCINATION STATUS:  There is no immunization history on file for this patient.  Diabetes  -75 year old female patient with medical history as above.   She was originally seen for secondary hyperparathyroidism with elevated PTH as a result of CKD.  She did not require active intervention, more recent labs show acceptable PTH range between 110-130, with calcium remaining normal at 9.4-10 mg per DL.  She is following with nephrology, for advancing CKD, she is only on observation management for this.     -She is being seen in follow-up for management of her currently and chronically  uncontrolled, type 2 diabetes.   After reluctance for several months, she accepted low-dose basal insulin.  During her last visit, she was advised to take Tresiba 25 units nightly.  She insists that she ony did 20 units nightly and her a1c is higher at 8.3% increasing from 7.8%.   Due to worsening CKD, she was taken off of Metformin during her recent hospitalization.  She has no new complaints today.  She feels better. - Her thyroid ultrasound shows mild goiter with no discrete nodules. - She has no new complaints.  Review of Systems Limited as above.   Objective:    BP (!) 158/78    Pulse 76    Ht 5\' 4"  (1.626 m)    Wt 151 lb 12.8 oz (68.9 kg)    BMI 26.06 kg/m   Wt Readings from Last 3 Encounters:  06/30/21 151 lb 12.8 oz (68.9 kg)  05/11/21 153 lb 3.2 oz (69.5 kg)  04/27/21 150 lb (68 kg)     CMP Latest Ref Rng & Units 06/24/2021 06/07/2021 05/11/2021  Glucose 70 - 99 mg/dL 293(H) 206(H) 275(H)  BUN 8 - 27 mg/dL 65(H) 68(H) 58(H)  Creatinine 0.57 - 1.00 mg/dL 3.81(H) 3.61(H) 3.38(H)  Sodium 134 - 144 mmol/L 135 132(L) 136  Potassium 3.5 - 5.2 mmol/L 5.2 4.0 3.8   Chloride 96 - 106 mmol/L 96 98 104  CO2 20 - 29 mmol/L 19(L) 25 23  Calcium 8.7 - 10.3 mg/dL 10.1 9.7 9.6  Total Protein 6.0 - 8.5 g/dL 7.5 - -  Total Bilirubin 0.0 - 1.2 mg/dL 0.3 - -  Alkaline Phos 44 - 121 IU/L 77 - -  AST 0 - 40 IU/L 17 - -  ALT 0 - 32 IU/L 12 - -     Diabetic Labs (most recent): Lab Results  Component Value Date   HGBA1C 8.3 (A) 06/30/2021   HGBA1C 7.8 (A) 12/29/2020   HGBA1C 7.9 (A) 09/07/2020   Lipid Panel  Component Value Date/Time   CHOL 173 09/23/2015 0537   TRIG 96 09/23/2015 0537   HDL 36 (L) 09/23/2015 0537   CHOLHDL 4.8 09/23/2015 0537   VLDL 19 09/23/2015 0537   LDLCALC 118 (H) 09/23/2015 0537      Assessment & Plan:   1. Uncontrolled type 2 diabetes mellitus without complication, without long-term current use of insulin ([place]) -She presents with a meter showing only rare readings averaging 171- 215 . Her point-of-care A1c is  8.3% increasing from 7.8%, overall improving  from>14%. -She has responded to low-dose basal insulin she reluctantly accepted during her last visit.      -She is advised to increase Tresiba to 25 units nightly, associated with monitoring of blood glucose twice a day-daily before breakfast and at bedtime.    She is not a candidate for metformin.  She is encouraged to call clinic for blood glucose less than 70 or  greater than  200 mg/dL.   -She does not have a safe oral regimen to manage diabetes.  - she acknowledges that there is a room for improvement in her food and drink choices. - Suggestion is made for her to avoid simple carbohydrates  from her diet including Cakes, Sweet Desserts, Ice Cream, Soda (diet and regular), Sweet Tea, Candies, Chips, Cookies, Store Bought Juices, Alcohol in Excess of  1-2 drinks a day, Artificial Sweeteners,  Coffee Creamer, and "Sugar-free" Products, Lemonade. This will help patient to have more stable blood glucose profile and potentially avoid unintended weight gain.   2.  Hypercalcemia: Resolved  - Her repeat labs show normal calcium of 9.0,  PTH remaining stable between 110-127.  this combination is likely a result of CKD. - She is vitamin D replete now . -She will not need any intervention for at this time. - Her neck/thyroid ultrasound was unremarkable. -She will be considered for 24-hour urine calcium and creatinine measurement on subsequent visits. -She is encouraged to stay close to her nephrologist.   3. Essential hypertension  -Her blood pressure is not controlled to target.  She reports that she has not taken her medication this morning.  She has lisinopril 30 mg p.o. daily at breakfast, metoprolol 25 mg p.o. daily, isosorbide 30 mg p.o. daily, hydralazine 100 mg p.o. twice daily, amlodipine 5 mg p.o. daily, and Lasix as needed.  She will not need more prescriptions.  She is advised to be consistent with her available medications.  She was given refills on her  isosorbide based on her request.   - She is hesitant to add any additional therapy, but willing to discuss this with her PMD.   4. Vitamin D deficiency: See #1 above. -She is status post therapy with vitamin D, currently at 28 ng/dL.  She is given a refill on her calcitriol 0.5 mg p.o. daily.   5. Goiter -Her thyroid ultrasound showed mild goiter with no discrete nodules. No intervention needed at this point.   6) peripheral arterial disease on screening ABI -Patient will benefit from more directed assessment, will be referred to vascular surgery.  POCT ABI Results 06/30/21  Her ABI is abnormal today Right ABI: PAD      left ABI: PAD Arm systolic / diastolic: 588/50 mmHG -She will be sent to vascular surgery for better assessment.  - I advised patient to maintain close follow up with Vidal Schwalbe, MD for primary care needs.    I spent  31 minutes in the care of the patient today including review  of labs from [facility], Lipids, Thyroid Function, Hematology (current and previous  including abstractions from other facilities); face-to-face time discussing  her blood glucose readings/logs, discussing hypoglycemia and hyperglycemia episodes and symptoms, medications doses, her options of short and long term treatment based on the latest standards of care / guidelines;  discussion about incorporating lifestyle medicine;  and documenting the encounter.    Please refer to Patient Instructions for Blood Glucose Monitoring and Insulin/Medications Dosing Guide"  in media tab for additional information. Please  also refer to " Patient Self Inventory" in the Media  tab for reviewed elements of pertinent patient history.  [place] M Budreau participated in the discussions, expressed understanding, and voiced agreement with the above plans.  All questions were answered to her satisfaction. she is encouraged to contact clinic should she have any questions or concerns prior to her return visit.   Follow up plan: Return in about 3 months (around 09/28/2021) for Bring Meter and Logs- A1c in Office.  Glade Lloyd, MD Phone: [phone]  Fax: [phone]  -  This note was partially dictated with voice recognition software. Similar sounding words can be transcribed inadequately or may not  be corrected upon review.  06/30/2021, 4:17 PM

## 2021-07-06 ENCOUNTER — Encounter (HOSPITAL_COMMUNITY)
Admission: RE | Admit: 2021-07-06 | Discharge: 2021-07-06 | Disposition: A | Discharge status: Home / Self Care | Payer: Medicare HMO | Source: Ambulatory Visit | Attending: Nephrology | Admitting: Nephrology

## 2021-07-06 ENCOUNTER — Other Ambulatory Visit: Payer: Self-pay

## 2021-07-06 DIAGNOSIS — N184 Chronic kidney disease, stage 4 (severe): Secondary | ICD-10-CM | POA: Insufficient documentation

## 2021-07-06 DIAGNOSIS — D631 Anemia in chronic kidney disease: Secondary | ICD-10-CM | POA: Insufficient documentation

## 2021-07-06 LAB — FERRITIN: Ferritin: 290 ng/mL (ref 11–307)

## 2021-07-06 LAB — CBC WITH DIFFERENTIAL/PLATELET
Abs Immature Granulocytes: 0.02 10*3/uL (ref 0.00–0.07)
Basophils Absolute: 0.1 10*3/uL (ref 0.0–0.1)
Basophils Relative: 1 %
Eosinophils Absolute: 0.2 10*3/uL (ref 0.0–0.5)
Eosinophils Relative: 3 %
HCT: 29.6 % — ABNORMAL LOW (ref 36.0–46.0)
Hemoglobin: 9.7 g/dL — ABNORMAL LOW (ref 12.0–15.0)
Immature Granulocytes: 0 %
Lymphocytes Relative: 18 %
Lymphs Abs: 1.5 10*3/uL (ref 0.7–4.0)
MCH: 29.8 pg (ref 26.0–34.0)
MCHC: 32.8 g/dL (ref 30.0–36.0)
MCV: 91.1 fL (ref 80.0–100.0)
Monocytes Absolute: 0.4 10*3/uL (ref 0.1–1.0)
Monocytes Relative: 4 %
Neutro Abs: 6.2 10*3/uL (ref 1.7–7.7)
Neutrophils Relative %: 74 %
Platelets: 343 10*3/uL (ref 150–400)
RBC: 3.25 MIL/uL — ABNORMAL LOW (ref 3.87–5.11)
RDW: 13.3 % (ref 11.5–15.5)
WBC: 8.3 10*3/uL (ref 4.0–10.5)
nRBC: 0 % (ref 0.0–0.2)

## 2021-07-06 LAB — IRON AND TIBC
Iron: 69 ug/dL (ref 28–170)
Saturation Ratios: 31 % (ref 10.4–31.8)
TIBC: 222 ug/dL — ABNORMAL LOW (ref 250–450)
UIBC: 153 ug/dL

## 2021-07-06 LAB — RENAL FUNCTION PANEL
Albumin: 3.5 g/dL (ref 3.5–5.0)
Anion gap: 9 (ref 5–15)
BUN: 71 mg/dL — ABNORMAL HIGH (ref 8–23)
CO2: 21 mmol/L — ABNORMAL LOW (ref 22–32)
Calcium: 9.2 mg/dL (ref 8.9–10.3)
Chloride: 103 mmol/L (ref 98–111)
Creatinine, Ser: 3.67 mg/dL — ABNORMAL HIGH (ref 0.44–1.00)
GFR, Estimated: 12 mL/min — ABNORMAL LOW (ref >60)
Glucose, Bld: 285 mg/dL — ABNORMAL HIGH (ref 70–99)
Phosphorus: 3.8 mg/dL (ref 2.5–4.6)
Potassium: 3.9 mmol/L (ref 3.5–5.1)
Sodium: 133 mmol/L — ABNORMAL LOW (ref 135–145)

## 2021-07-06 LAB — PROTEIN / CREATININE RATIO, URINE
Creatinine, Urine: 98.55 mg/dL
Protein Creatinine Ratio: 1.18 mg/mg{Cre} — ABNORMAL HIGH (ref 0.00–0.15)
Total Protein, Urine: 116 mg/dL

## 2021-07-06 LAB — POCT HEMOGLOBIN-HEMACUE: Hemoglobin: 9.5 g/dL — ABNORMAL LOW (ref 12.0–15.0)

## 2021-07-06 MED ORDER — EPOETIN ALFA-EPBX 3000 UNIT/ML IJ SOLN
3000.0000 [IU] | Freq: Once | INTRAMUSCULAR | Status: AC
  Administered 2021-07-06: 15:00:00 3000 [IU] via SUBCUTANEOUS
  Filled 2021-07-06: qty 1

## 2021-07-07 LAB — PTH, INTACT AND CALCIUM
Calcium, Total (PTH): 9.2 mg/dL (ref 8.7–10.3)
PTH: 158 pg/mL — ABNORMAL HIGH (ref 15–65)

## 2021-07-07 LAB — VITAMIN D 25 HYDROXY (VIT D DEFICIENCY, FRACTURES): Vit D, 25-Hydroxy: 36.83 ng/mL (ref 30–100)

## 2021-07-20 ENCOUNTER — Encounter (HOSPITAL_COMMUNITY): Payer: Self-pay

## 2021-07-20 ENCOUNTER — Encounter (HOSPITAL_COMMUNITY)
Admission: RE | Admit: 2021-07-20 | Discharge: 2021-07-20 | Disposition: A | Discharge status: Home / Self Care | Payer: Medicare HMO | Source: Ambulatory Visit | Attending: Nephrology | Admitting: Nephrology

## 2021-07-20 DIAGNOSIS — N184 Chronic kidney disease, stage 4 (severe): Secondary | ICD-10-CM | POA: Diagnosis present

## 2021-07-20 DIAGNOSIS — D631 Anemia in chronic kidney disease: Secondary | ICD-10-CM | POA: Insufficient documentation

## 2021-07-20 LAB — POCT HEMOGLOBIN-HEMACUE: Hemoglobin: 9.4 g/dL — ABNORMAL LOW (ref 12.0–15.0)

## 2021-07-20 MED ORDER — EPOETIN ALFA-EPBX 3000 UNIT/ML IJ SOLN
3000.0000 [IU] | Freq: Once | INTRAMUSCULAR | Status: AC
  Administered 2021-07-20: 3000 [IU] via SUBCUTANEOUS
  Filled 2021-07-20: qty 1

## 2021-08-03 ENCOUNTER — Other Ambulatory Visit: Payer: Self-pay

## 2021-08-03 ENCOUNTER — Encounter (HOSPITAL_COMMUNITY)
Admission: RE | Admit: 2021-08-03 | Discharge: 2021-08-03 | Disposition: A | Discharge status: Home / Self Care | Payer: Medicare HMO | Source: Ambulatory Visit | Attending: Nephrology | Admitting: Nephrology

## 2021-08-03 DIAGNOSIS — N184 Chronic kidney disease, stage 4 (severe): Secondary | ICD-10-CM | POA: Diagnosis not present

## 2021-08-03 LAB — POCT HEMOGLOBIN-HEMACUE: Hemoglobin: 9.5 g/dL — ABNORMAL LOW (ref 12.0–15.0)

## 2021-08-03 MED ORDER — EPOETIN ALFA-EPBX 3000 UNIT/ML IJ SOLN
INTRAMUSCULAR | Status: AC
  Filled 2021-08-03: qty 1

## 2021-08-03 MED ORDER — EPOETIN ALFA-EPBX 3000 UNIT/ML IJ SOLN
3000.0000 [IU] | Freq: Once | INTRAMUSCULAR | Status: AC
  Administered 2021-08-03: 3000 [IU] via SUBCUTANEOUS

## 2021-08-17 ENCOUNTER — Encounter (HOSPITAL_COMMUNITY): Payer: Self-pay

## 2021-08-17 ENCOUNTER — Other Ambulatory Visit: Payer: Self-pay

## 2021-08-17 ENCOUNTER — Encounter (HOSPITAL_COMMUNITY)
Admission: RE | Admit: 2021-08-17 | Discharge: 2021-08-17 | Disposition: A | Discharge status: Home / Self Care | Payer: Medicare HMO | Source: Ambulatory Visit | Attending: Nephrology | Admitting: Nephrology

## 2021-08-17 DIAGNOSIS — N184 Chronic kidney disease, stage 4 (severe): Secondary | ICD-10-CM | POA: Insufficient documentation

## 2021-08-17 DIAGNOSIS — D631 Anemia in chronic kidney disease: Secondary | ICD-10-CM | POA: Insufficient documentation

## 2021-08-17 LAB — POCT HEMOGLOBIN-HEMACUE: Hemoglobin: 9.9 g/dL — ABNORMAL LOW (ref 12.0–15.0)

## 2021-08-17 MED ORDER — EPOETIN ALFA-EPBX 3000 UNIT/ML IJ SOLN
3000.0000 [IU] | Freq: Once | INTRAMUSCULAR | Status: AC
  Administered 2021-08-17: 3000 [IU] via SUBCUTANEOUS
  Filled 2021-08-17: qty 1

## 2021-08-31 ENCOUNTER — Encounter (HOSPITAL_COMMUNITY)
Admission: RE | Admit: 2021-08-31 | Discharge: 2021-08-31 | Disposition: A | Discharge status: Home / Self Care | Payer: Medicare HMO | Source: Ambulatory Visit | Attending: Nephrology | Admitting: Nephrology

## 2021-09-23 ENCOUNTER — Encounter: Payer: Self-pay | Admitting: Cardiology

## 2021-09-23 ENCOUNTER — Ambulatory Visit (INDEPENDENT_AMBULATORY_CARE_PROVIDER_SITE_OTHER): Payer: Medicare HMO | Admitting: Cardiology

## 2021-09-23 ENCOUNTER — Other Ambulatory Visit: Payer: Self-pay

## 2021-09-23 VITALS — BP 178/90 | HR 68 | Ht 64.0 in | Wt 149.0 lb

## 2021-09-23 DIAGNOSIS — I10 Essential (primary) hypertension: Secondary | ICD-10-CM

## 2021-09-23 DIAGNOSIS — E782 Mixed hyperlipidemia: Secondary | ICD-10-CM | POA: Diagnosis not present

## 2021-09-23 DIAGNOSIS — I5032 Chronic diastolic (congestive) heart failure: Secondary | ICD-10-CM | POA: Diagnosis not present

## 2021-09-23 MED ORDER — CARVEDILOL 12.5 MG PO TABS: 12.5000 mg | ORAL_TABLET | Freq: Two times a day (BID) | ORAL | 3 refills | Status: DC

## 2021-09-23 MED ORDER — AMLODIPINE BESYLATE 10 MG PO TABS: 10.0000 mg | ORAL_TABLET | Freq: Every day | ORAL | 3 refills | Status: DC

## 2021-09-23 NOTE — Progress Notes (Signed)
? ? ? ?Clinical Summary ?Ms. Tippy is a 76 y.o.female seen today for follow up of the following medical problems.  ? ?1.Chronic combined systolic/diastolic HF ?09/8935 echo LVEF 55-60%, grade I dd ?-05/2020 echo shows her EF is now reduced at 40-45% with Grade 2 DD and global HK ?06/2021 echo LVEF 55-60%, grade I dd  ?- medical therapy has been limited by renal dysfunction ? ?-no SOB/DOE. No recent edema.  ?- compliant with meds ? ?2. CKD IV ?- followed by Dr Theador Hawthorne ? ?3. HTN ?- compliant with meds ? ? ?4. Hyperlipidemia ?- labs followed by pcp ?- she is on zetia 10mg  daily.  ? ? ? ?Past Medical History:  ?Diagnosis Date  ? Assistance needed for mobility   ? walks with walker  ? Confusion   ? Dementia ([place])   ? Diabetes mellitus without complication ([place])   ? Generalized weakness   ? Hypercalcemia   ? Hyperlipidemia   ? Hyperparathyroidism ([place])   ? Hypertension   ? Hypertension   ? Moderate protein-calorie malnutrition ([place])   ? Peripheral neuropathy   ? UTI (lower urinary tract infection)   ? ? ? ?Allergies  ?Allergen Reactions  ? Penicillins Other (See Comments)  ?  Makes patient sore. ? ?Has patient had a PCN reaction causing immediate rash, facial/tongue/throat swelling, SOB or lightheadedness with hypotension: No ?Has patient had a PCN reaction causing severe rash involving mucus membranes or skin necrosis: No ?Has patient had a PCN reaction that required hospitalization No ?Has patient had a PCN reaction occurring within the last 10 years: No ?If all of the above answers are "NO", then may proceed with Cephalosporin use. ?  ? Statins Other (See Comments)  ?  Makes wrists and fingers ache ?Other reaction(s): Muscle Pain  ? Statins Support [Acid Blockers Support]   ?  Other reaction(s): joint pain  ? ? ? ?Current Outpatient Medications  ?Medication Sig Dispense Refill  ? amLODipine (NORVASC) 5 MG tablet Take by mouth daily.    ? aspirin EC 81 MG tablet Take 81 mg by mouth daily.    ? calcitRIOL (ROCALTROL) 0.25  MCG capsule Take 1 capsule (0.25 mcg total) by mouth daily. 90 capsule 1  ? Cyanocobalamin (VITAMIN B-12 PO) Take 1 tablet by mouth daily.    ? ezetimibe (ZETIA) 10 MG tablet daily.  2  ? FEROSUL 325 (65 Fe) MG tablet Take 325 mg by mouth 2 (two) times daily.    ? furosemide (LASIX) 80 MG tablet Take 40 mg by mouth 2 (two) times daily.    ? hydrALAZINE (APRESOLINE) 100 MG tablet Take 1 tablet (100 mg total) by mouth 2 (two) times daily. 180 tablet 3  ? insulin degludec (TRESIBA FLEXTOUCH) 100 UNIT/ML FlexTouch Pen Inject 25 Units into the skin at bedtime. 15 mL 1  ? isosorbide mononitrate (IMDUR) 30 MG 24 hr tablet Take 1 tablet (30 mg total) by mouth daily. 90 tablet 1  ? metoprolol succinate (TOPROL XL) 25 MG 24 hr tablet Take 1 tablet (25 mg total) by mouth daily. 90 tablet 3  ? potassium chloride (KLOR-CON) 10 MEQ tablet Take 10 mEq by mouth daily.    ? sodium bicarbonate 650 MG tablet See admin instructions.    ? ?No current facility-administered medications for this visit.  ? ? ? ?Past Surgical History:  ?Procedure Laterality Date  ? COLONOSCOPY N/A 08/12/2015  ? Procedure: COLONOSCOPY;  Surgeon: Daneil Dolin, MD;  [facility];  Service:  Endoscopy;  Laterality: N/A;  1245pm  ? ESOPHAGOGASTRODUODENOSCOPY N/A 08/12/2015  ? Procedure: ESOPHAGOGASTRODUODENOSCOPY (EGD);  Surgeon: Daneil Dolin, MD;  [facility];  Service: Endoscopy;  Laterality: N/A;  ? none    ? ? ? ?Allergies  ?Allergen Reactions  ? Penicillins Other (See Comments)  ?  Makes patient sore. ? ?Has patient had a PCN reaction causing immediate rash, facial/tongue/throat swelling, SOB or lightheadedness with hypotension: No ?Has patient had a PCN reaction causing severe rash involving mucus membranes or skin necrosis: No ?Has patient had a PCN reaction that required hospitalization No ?Has patient had a PCN reaction occurring within the last 10 years: No ?If all of the above answers are "NO", then may proceed with  Cephalosporin use. ?  ? Statins Other (See Comments)  ?  Makes wrists and fingers ache ?Other reaction(s): Muscle Pain  ? Statins Support [Acid Blockers Support]   ?  Other reaction(s): joint pain  ? ? ? ? ?Family History  ?Problem Relation Age of Onset  ? Hypertension Brother   ? Hypertension Brother   ? Diabetes Brother   ? Diabetes Father   ? Heart attack Mother   ? Colon cancer Neg Hx   ? ? ? ?Social History ?Ms. Hopkin reports that she has never smoked. She has never used smokeless tobacco. ?Ms. Gillentine reports no history of alcohol use. ? ? ?Review of Systems ?CONSTITUTIONAL: No weight loss, fever, chills, weakness or fatigue.  ?HEENT: Eyes: No visual loss, blurred vision, double vision or yellow sclerae.No hearing loss, sneezing, congestion, runny nose or sore throat.  ?SKIN: No rash or itching.  ?CARDIOVASCULAR: per hpi ?RESPIRATORY: No shortness of breath, cough or sputum.  ?GASTROINTESTINAL: No anorexia, nausea, vomiting or diarrhea. No abdominal pain or blood.  ?GENITOURINARY: No burning on urination, no polyuria ?NEUROLOGICAL: No headache, dizziness, syncope, paralysis, ataxia, numbness or tingling in the extremities. No change in bowel or bladder control.  ?MUSCULOSKELETAL: No muscle, back pain, joint pain or stiffness.  ?LYMPHATICS: No enlarged nodes. No history of splenectomy.  ?PSYCHIATRIC: No history of depression or anxiety.  ?ENDOCRINOLOGIC: No reports of sweating, cold or heat intolerance. No polyuria or polydipsia.  ?. ? ? ?Physical Examination ?Today's Vitals  ? 09/23/21 1531 09/23/21 1535  ?BP: (!) 236/92 (!) 228/92  ?Pulse: 68   ?SpO2: 98%   ?Weight: 149 lb (67.6 kg)   ?Height: 5\' 4"  (1.626 m)   ? ?Body mass index is 25.58 kg/m?. ? ?Gen: resting comfortably, no acute distress ?HEENT: no scleral icterus, pupils equal round and reactive, no palptable cervical adenopathy,  ?CV: RRR, no m/r/g no jvd ?Resp: Clear to auscultation bilaterally ?GI: abdomen is soft, non-tender, non-distended, normal bowel  sounds, no hepatosplenomegaly ?MSK: extremities are warm, no edema.  ?Skin: warm, no rash ?Neuro:  no focal deficits ?Psych: appropriate affect ? ? ? ? ?Assessment and Plan  ? ?1.Chronic diastolic HF ?- prior systolic dysfunction that has resovled, ongonig mild diastolic dysfunction ?- no recent symptoms, continue current meds ? ?2. HTN ?- significantly elevated today, she reports medication compliance ?- reports would not be able to take hydral tid, difficult to remember. Increase norvasc to 10mg  daily, change toprol to coreg 12.5mg  bid. In general medication options limited by significant renal dysfunction.  ?-update [place] on bp's in 1 weeks ? ?3. Hyperlipidemia ?- request labs from pcp ? ?EKG shows SR, isolated PVC ? ? ?Arnoldo Lenis, M.D. ?

## 2021-09-23 NOTE — Patient Instructions (Signed)
Medication Instructions:  ?Increase- Norvasc to 10 mg tablets daily ?Stop- Toprol XL ?Start- Coreg 12.5 mg tablets twice daily ? ?Follow-Up: ?Follow up with Dr. Harl Bowie in 6 months.  ? ?Any Other Special Instructions Will Be Listed Below (If Applicable). ? ?Please keep a log of your blood pressures for 1 week and update our office next Friday. ? ? ?If you need a refill on your cardiac medications before your next appointment, please call your pharmacy. ? ?

## 2021-09-28 ENCOUNTER — Encounter: Payer: Self-pay | Admitting: "Endocrinology

## 2021-09-28 ENCOUNTER — Other Ambulatory Visit: Payer: Self-pay

## 2021-09-28 ENCOUNTER — Ambulatory Visit (INDEPENDENT_AMBULATORY_CARE_PROVIDER_SITE_OTHER): Payer: Medicare HMO | Admitting: "Endocrinology

## 2021-09-28 VITALS — BP 140/58 | HR 68 | Ht 64.0 in | Wt 148.2 lb

## 2021-09-28 DIAGNOSIS — E119 Type 2 diabetes mellitus without complications: Secondary | ICD-10-CM

## 2021-09-28 DIAGNOSIS — Z794 Long term (current) use of insulin: Secondary | ICD-10-CM | POA: Diagnosis not present

## 2021-09-28 DIAGNOSIS — E782 Mixed hyperlipidemia: Secondary | ICD-10-CM | POA: Diagnosis not present

## 2021-09-28 DIAGNOSIS — I10 Essential (primary) hypertension: Secondary | ICD-10-CM | POA: Diagnosis not present

## 2021-09-28 LAB — POCT GLYCOSYLATED HEMOGLOBIN (HGB A1C): HbA1c, POC (controlled diabetic range): 8.8 % — AB (ref 0.0–7.0)

## 2021-09-28 NOTE — Patient Instructions (Signed)

## 2021-09-28 NOTE — Progress Notes (Signed)
?09/28/2021 ? ?Endocrinology follow-up note ? ?Subjective:  ? ? Patient ID: Maria Parrish, female    DOB: [birth year], PCP Vidal Schwalbe, MD ? ? ?Past Medical History:  ?Diagnosis Date  ? Assistance needed for mobility   ? walks with walker  ? Confusion   ? Dementia ([place])   ? Diabetes mellitus without complication ([place])   ? Generalized weakness   ? Hypercalcemia   ? Hyperlipidemia   ? Hyperparathyroidism ([place])   ? Hypertension   ? Hypertension   ? Moderate protein-calorie malnutrition ([place])   ? Peripheral neuropathy   ? UTI (lower urinary tract infection)   ? ?Past Surgical History:  ?Procedure Laterality Date  ? COLONOSCOPY N/A 08/12/2015  ? Procedure: COLONOSCOPY;  Surgeon: Daneil Dolin, MD;  [facility];  Service: Endoscopy;  Laterality: N/A;  1245pm  ? ESOPHAGOGASTRODUODENOSCOPY N/A 08/12/2015  ? Procedure: ESOPHAGOGASTRODUODENOSCOPY (EGD);  Surgeon: Daneil Dolin, MD;  [facility];  Service: Endoscopy;  Laterality: N/A;  ? none    ? ?Social History  ? ?Socioeconomic History  ? Marital status: Widowed  ?  Spouse name: Not on file  ? Number of children: 2  ? Years of education: Not on file  ? Highest education level: Not on file  ?Occupational History  ? Occupation: Nutritional therapist for [employer]  ?Tobacco Use  ? Smoking status: Never  ? Smokeless tobacco: Never  ?Vaping Use  ? Vaping Use: Never used  ?Substance and Sexual Activity  ? Alcohol use: No  ? Drug use: No  ? Sexual activity: Not Currently  ?Other Topics Concern  ? Not on file  ?Social History Narrative  ? Not on file  ? ?Social Determinants of Health  ? ?Financial Resource Strain: Not on file  ?Food Insecurity: Not on file  ?Transportation Needs: Not on file  ?Physical Activity: Not on file  ?Stress: Not on file  ?Social Connections: Not on file  ? ?Outpatient Encounter Medications as of 09/28/2021  ?Medication Sig  ? amLODipine (NORVASC) 10 MG tablet Take 1 tablet (10 mg total) by mouth daily.  ? aspirin EC 81 MG tablet Take 81 mg by mouth  daily.  ? Azelastine HCl 137 MCG/SPRAY SOLN Place into both nostrils.  ? calcitRIOL (ROCALTROL) 0.25 MCG capsule Take 1 capsule (0.25 mcg total) by mouth daily.  ? carvedilol (COREG) 12.5 MG tablet Take 1 tablet (12.5 mg total) by mouth 2 (two) times daily.  ? Cyanocobalamin (VITAMIN B-12 PO) Take 1 tablet by mouth daily.  ? ezetimibe (ZETIA) 10 MG tablet daily.  ? FEROSUL 325 (65 Fe) MG tablet Take 325 mg by mouth 2 (two) times daily. (Patient not taking: Reported on 09/23/2021)  ? furosemide (LASIX) 80 MG tablet Take 40 mg by mouth 2 (two) times daily.  ? hydrALAZINE (APRESOLINE) 100 MG tablet Take 1 tablet (100 mg total) by mouth 2 (two) times daily.  ? insulin degludec (TRESIBA FLEXTOUCH) 100 UNIT/ML FlexTouch Pen Inject 25 Units into the skin at bedtime.  ? isosorbide mononitrate (IMDUR) 30 MG 24 hr tablet Take 1 tablet (30 mg total) by mouth daily.  ? potassium chloride (KLOR-CON) 10 MEQ tablet Take 10 mEq by mouth daily.  ? sodium bicarbonate 650 MG tablet See admin instructions.  ? ?No facility-administered encounter medications on file as of 09/28/2021.  ? ?ALLERGIES: ?Allergies  ?Allergen Reactions  ? Penicillins Other (See Comments)  ?  Makes patient sore. ? ?Has patient had a PCN reaction causing immediate rash, facial/tongue/throat  swelling, SOB or lightheadedness with hypotension: No ?Has patient had a PCN reaction causing severe rash involving mucus membranes or skin necrosis: No ?Has patient had a PCN reaction that required hospitalization No ?Has patient had a PCN reaction occurring within the last 10 years: No ?If all of the above answers are "NO", then may proceed with Cephalosporin use. ?  ? Statins Other (See Comments)  ?  Makes wrists and fingers ache ?Other reaction(s): Muscle Pain  ? Statins Support [Acid Blockers Support]   ?  Other reaction(s): joint pain  ? ?VACCINATION STATUS: ? ?There is no immunization history on file for this patient. ? ?Diabetes ? ?-76 year old female patient with  medical history as above.   She was originally seen for secondary hyperparathyroidism with elevated PTH as a result of CKD.  She did not require active intervention, more recent labs show acceptable PTH range between 110-130, with calcium remaining normal at 9.4-10 mg per DL.  She is following with nephrology, for advancing CKD, she is only on observation management for this.    ? ?-She is being seen in follow-up for management of her currently and chronically  uncontrolled, type 2 diabetes.   After reluctance for several months, she accepted low-dose basal insulin.  During her last visit, she was advised to take Tresiba 25 units nightly and advised to monitor BG 2 times a day. She presents with a meter which shows only 2 readings in 2 weeks. ?Her POC a1c is 8.8% , improved from last time. ? ? ? Due to worsening CKD, she was taken off of Metformin during her recent hospitalization. ? She has no new complaints today.  She feels better. ?- Her thyroid ultrasound shows mild goiter with no discrete nodules. ?- She has no new complaints. ? ?Review of Systems ?Limited as above. ? ? ?Objective:  ?  ?BP (!) 140/58   Pulse 68   Ht 5\' 4"  (1.626 m)   Wt 148 lb 3.2 oz (67.2 kg)   BMI 25.44 kg/m?   ?Wt Readings from Last 3 Encounters:  ?09/28/21 148 lb 3.2 oz (67.2 kg)  ?09/23/21 149 lb (67.6 kg)  ?08/17/21 151 lb 14.4 oz (68.9 kg)  ?  ? ?CMP Latest Ref Rng & Units 07/06/2021 07/06/2021 06/24/2021  ?Glucose 70 - 99 mg/dL 285(H) - 293(H)  ?BUN 8 - 23 mg/dL 71(H) - 65(H)  ?Creatinine 0.44 - 1.00 mg/dL 3.67(H) - 3.81(H)  ?Sodium 135 - 145 mmol/L 133(L) - 135  ?Potassium 3.5 - 5.1 mmol/L 3.9 - 5.2  ?Chloride 98 - 111 mmol/L 103 - 96  ?CO2 22 - 32 mmol/L 21(L) - 19(L)  ?Calcium 8.7 - 10.3 mg/dL 9.2 9.2 10.1  ?Total Protein 6.0 - 8.5 g/dL - - 7.5  ?Total Bilirubin 0.0 - 1.2 mg/dL - - 0.3  ?Alkaline Phos 44 - 121 IU/L - - 77  ?AST 0 - 40 IU/L - - 17  ?ALT 0 - 32 IU/L - - 12  ? ? ? ?Diabetic Labs (most recent): ?Lab Results   ?Component Value Date  ? HGBA1C 8.8 (A) 09/28/2021  ? HGBA1C 8.3 (A) 06/30/2021  ? HGBA1C 7.8 (A) 12/29/2020  ? ?Lipid Panel  ?   ?Component Value Date/Time  ? CHOL 173 09/23/2015 0537  ? TRIG 96 09/23/2015 0537  ? HDL 36 (L) 09/23/2015 0537  ? CHOLHDL 4.8 09/23/2015 0537  ? VLDL 19 09/23/2015 0537  ? [place] 118 (H) 09/23/2015 0537  ? ? ? ? ?Assessment & Plan:  ? ?  1. Uncontrolled type 2 diabetes mellitus without complication, without long-term current use of insulin ([place]) ? ?She is not monitoring BG adequately. Her a1c is 8.8%. ?She is at high risk for hypoglycemia. ? ? ?-She is advised to continue  Tresiba  25 units nightly, associated with monitoring of blood glucose twice a day-daily before breakfast and at bedtime.   ? ?She is not a candidate for metformin.  She is encouraged to call clinic for blood glucose less than 70 or  greater than  200 mg/dL.  ? ?-She does not have a safe oral regimen to manage diabetes. ? ?- she acknowledges that there is a room for improvement in her food and drink choices. ?- Suggestion is made for her to avoid simple carbohydrates  from her diet including Cakes, Sweet Desserts, Ice Cream, Soda (diet and regular), Sweet Tea, Candies, Chips, Cookies, Store Bought Juices, Alcohol in Excess of  1-2 drinks a day, Artificial Sweeteners,  Coffee Creamer, and "Sugar-free" Products, Lemonade. This will help patient to have more stable blood glucose profile and potentially avoid unintended weight gain. ? ?2. Hypercalcemia: Resolved ? ?- Her repeat labs show normal calcium of 9.0,  PTH remaining stable between 110-127.  ?this combination is likely a result of CKD. ?- She is vitamin D replete now . ?-She will not need any intervention for at this time. ?- Her neck/thyroid ultrasound was unremarkable. ?-She will be considered for 24-hour urine calcium and creatinine measurement on subsequent visits. ?-She is encouraged to stay close to her nephrologist. ? ? ?3. Essential hypertension ? ?-Her  blood pressure is not controlled to target.  She reports that she has not taken her medication this morning.  She has lisinopril 30 mg p.o. daily at breakfast, metoprolol 25 mg p.o. daily, isosorbide 30 mg p.o. daily, hydra

## 2021-09-29 ENCOUNTER — Encounter (HOSPITAL_COMMUNITY): Payer: Self-pay

## 2021-09-29 ENCOUNTER — Encounter (HOSPITAL_COMMUNITY)
Admission: RE | Admit: 2021-09-29 | Discharge: 2021-09-29 | Disposition: A | Discharge status: Home / Self Care | Payer: Medicare HMO | Source: Ambulatory Visit | Attending: Nephrology | Admitting: Nephrology

## 2021-09-29 DIAGNOSIS — N184 Chronic kidney disease, stage 4 (severe): Secondary | ICD-10-CM | POA: Diagnosis present

## 2021-09-29 DIAGNOSIS — D631 Anemia in chronic kidney disease: Secondary | ICD-10-CM | POA: Diagnosis not present

## 2021-09-29 LAB — POCT HEMOGLOBIN-HEMACUE: Hemoglobin: 9.7 g/dL — ABNORMAL LOW (ref 12.0–15.0)

## 2021-09-29 MED ORDER — EPOETIN ALFA-EPBX 3000 UNIT/ML IJ SOLN
3000.0000 [IU] | Freq: Once | INTRAMUSCULAR | Status: AC
  Administered 2021-09-29: 3000 [IU] via SUBCUTANEOUS

## 2021-09-29 MED ORDER — EPOETIN ALFA-EPBX 3000 UNIT/ML IJ SOLN
INTRAMUSCULAR | Status: AC
  Filled 2021-09-29: qty 1

## 2021-09-30 ENCOUNTER — Telehealth: Payer: Self-pay | Admitting: Cardiology

## 2021-09-30 NOTE — Telephone Encounter (Signed)
I will send FYI to Dr.Branch ?

## 2021-09-30 NOTE — Telephone Encounter (Signed)
Maria Parrish came into the office to report recent BP readings requeted by Dr. Harl Bowie ? ?BP 140/58   09/27/2021 ?BP 154/83   09/28/2021 ?BP 158/78   09/29/2021 ? ? ?Patient's son states that  patient has never taken Toprol XL ? ?[phone]  [name])  ?

## 2021-10-02 NOTE — Telephone Encounter (Signed)
Can they clarify if she started the coreg since last visit and clarify the dosing. Do they have any heart rates from the home checks ? ? ?Carlyle Dolly MD ?

## 2021-10-03 NOTE — Telephone Encounter (Signed)
Left message with son, [name], to return call. ?

## 2021-10-04 NOTE — Telephone Encounter (Signed)
Left message to return call again. ?

## 2021-10-05 NOTE — Telephone Encounter (Signed)
Son,Greg walked into office. ? ?He has not started his mother on Coreg but will start 12.5 mg BID tonight. ? ?He did not record her heart rates but will start.He will call me back Friday (3/24) after work with update ?

## 2021-10-13 ENCOUNTER — Encounter (HOSPITAL_COMMUNITY): Payer: Self-pay

## 2021-10-13 ENCOUNTER — Encounter (HOSPITAL_COMMUNITY)
Admission: RE | Admit: 2021-10-13 | Discharge: 2021-10-13 | Disposition: A | Discharge status: Home / Self Care | Payer: Medicare HMO | Source: Ambulatory Visit | Attending: Nephrology | Admitting: Nephrology

## 2021-10-13 DIAGNOSIS — N184 Chronic kidney disease, stage 4 (severe): Secondary | ICD-10-CM | POA: Diagnosis not present

## 2021-10-13 LAB — CBC WITH DIFFERENTIAL/PLATELET
Abs Immature Granulocytes: 0.02 10*3/uL (ref 0.00–0.07)
Basophils Absolute: 0 10*3/uL (ref 0.0–0.1)
Basophils Relative: 1 %
Eosinophils Absolute: 0.2 10*3/uL (ref 0.0–0.5)
Eosinophils Relative: 4 %
HCT: 26.3 % — ABNORMAL LOW (ref 36.0–46.0)
Hemoglobin: 8.8 g/dL — ABNORMAL LOW (ref 12.0–15.0)
Immature Granulocytes: 0 %
Lymphocytes Relative: 23 %
Lymphs Abs: 1.5 10*3/uL (ref 0.7–4.0)
MCH: 29.6 pg (ref 26.0–34.0)
MCHC: 33.5 g/dL (ref 30.0–36.0)
MCV: 88.6 fL (ref 80.0–100.0)
Monocytes Absolute: 0.4 10*3/uL (ref 0.1–1.0)
Monocytes Relative: 5 %
Neutro Abs: 4.5 10*3/uL (ref 1.7–7.7)
Neutrophils Relative %: 67 %
Platelets: 328 10*3/uL (ref 150–400)
RBC: 2.97 MIL/uL — ABNORMAL LOW (ref 3.87–5.11)
RDW: 14.2 % (ref 11.5–15.5)
WBC: 6.6 10*3/uL (ref 4.0–10.5)
nRBC: 0 % (ref 0.0–0.2)

## 2021-10-13 LAB — RENAL FUNCTION PANEL
Albumin: 3.3 g/dL — ABNORMAL LOW (ref 3.5–5.0)
Anion gap: 12 (ref 5–15)
BUN: 58 mg/dL — ABNORMAL HIGH (ref 8–23)
CO2: 26 mmol/L (ref 22–32)
Calcium: 9.3 mg/dL (ref 8.9–10.3)
Chloride: 98 mmol/L (ref 98–111)
Creatinine, Ser: 3.27 mg/dL — ABNORMAL HIGH (ref 0.44–1.00)
GFR, Estimated: 14 mL/min — ABNORMAL LOW (ref >60)
Glucose, Bld: 296 mg/dL — ABNORMAL HIGH (ref 70–99)
Phosphorus: 3.4 mg/dL (ref 2.5–4.6)
Potassium: 3.3 mmol/L — ABNORMAL LOW (ref 3.5–5.1)
Sodium: 136 mmol/L (ref 135–145)

## 2021-10-13 LAB — POCT HEMOGLOBIN-HEMACUE: Hemoglobin: 8.9 g/dL — ABNORMAL LOW (ref 12.0–15.0)

## 2021-10-13 LAB — PROTEIN / CREATININE RATIO, URINE
Creatinine, Urine: 14.74 mg/dL
Protein Creatinine Ratio: 1.42 mg/mg{Cre} — ABNORMAL HIGH (ref 0.00–0.15)
Total Protein, Urine: 21 mg/dL

## 2021-10-13 MED ORDER — EPOETIN ALFA-EPBX 3000 UNIT/ML IJ SOLN
INTRAMUSCULAR | Status: AC
  Administered 2021-10-13: 3000 [IU] via SUBCUTANEOUS
  Filled 2021-10-13: qty 1

## 2021-10-13 MED ORDER — EPOETIN ALFA-EPBX 3000 UNIT/ML IJ SOLN: 3000.0000 [IU] | Freq: Once | INTRAMUSCULAR | Status: DC

## 2021-10-24 ENCOUNTER — Other Ambulatory Visit: Payer: Self-pay | Admitting: "Endocrinology

## 2021-10-27 ENCOUNTER — Encounter (HOSPITAL_COMMUNITY): Payer: Self-pay

## 2021-10-27 ENCOUNTER — Encounter (HOSPITAL_COMMUNITY)
Admission: RE | Admit: 2021-10-27 | Discharge: 2021-10-27 | Disposition: A | Discharge status: Home / Self Care | Payer: Medicare HMO | Source: Ambulatory Visit | Attending: Nephrology | Admitting: Nephrology

## 2021-10-27 DIAGNOSIS — N184 Chronic kidney disease, stage 4 (severe): Secondary | ICD-10-CM | POA: Diagnosis not present

## 2021-10-27 DIAGNOSIS — D631 Anemia in chronic kidney disease: Secondary | ICD-10-CM | POA: Insufficient documentation

## 2021-10-27 LAB — POCT HEMOGLOBIN-HEMACUE: Hemoglobin: 9.6 g/dL — ABNORMAL LOW (ref 12.0–15.0)

## 2021-10-27 MED ORDER — EPOETIN ALFA-EPBX 3000 UNIT/ML IJ SOLN: 3000.0000 [IU] | Freq: Once | INTRAMUSCULAR | Status: DC

## 2021-10-27 MED ORDER — EPOETIN ALFA-EPBX 3000 UNIT/ML IJ SOLN
INTRAMUSCULAR | Status: AC
  Administered 2021-10-27: 3000 [IU] via SUBCUTANEOUS
  Filled 2021-10-27: qty 1

## 2021-10-28 ENCOUNTER — Other Ambulatory Visit: Payer: Self-pay | Admitting: Physician Assistant

## 2021-10-28 ENCOUNTER — Other Ambulatory Visit: Payer: Self-pay | Admitting: "Endocrinology

## 2021-11-10 ENCOUNTER — Encounter (HOSPITAL_COMMUNITY): Payer: Self-pay

## 2021-11-10 ENCOUNTER — Encounter (HOSPITAL_COMMUNITY)
Admission: RE | Admit: 2021-11-10 | Discharge: 2021-11-10 | Disposition: A | Discharge status: Home / Self Care | Payer: Medicare HMO | Source: Ambulatory Visit | Attending: Nephrology | Admitting: Nephrology

## 2021-11-10 DIAGNOSIS — N184 Chronic kidney disease, stage 4 (severe): Secondary | ICD-10-CM | POA: Diagnosis not present

## 2021-11-10 LAB — POCT HEMOGLOBIN-HEMACUE: Hemoglobin: 10.3 g/dL — ABNORMAL LOW (ref 12.0–15.0)

## 2021-11-10 MED ORDER — EPOETIN ALFA-EPBX 3000 UNIT/ML IJ SOLN: 3000.0000 [IU] | Freq: Once | INTRAMUSCULAR | Status: DC

## 2021-11-24 ENCOUNTER — Encounter (HOSPITAL_COMMUNITY): Payer: Self-pay

## 2021-11-24 ENCOUNTER — Encounter (HOSPITAL_COMMUNITY)
Admission: RE | Admit: 2021-11-24 | Discharge: 2021-11-24 | Disposition: A | Discharge status: Home / Self Care | Payer: Medicare HMO | Source: Ambulatory Visit | Attending: Nephrology | Admitting: Nephrology

## 2021-11-24 DIAGNOSIS — I5042 Chronic combined systolic (congestive) and diastolic (congestive) heart failure: Secondary | ICD-10-CM | POA: Diagnosis not present

## 2021-11-24 DIAGNOSIS — D631 Anemia in chronic kidney disease: Secondary | ICD-10-CM | POA: Insufficient documentation

## 2021-11-24 DIAGNOSIS — E1122 Type 2 diabetes mellitus with diabetic chronic kidney disease: Secondary | ICD-10-CM | POA: Diagnosis not present

## 2021-11-24 DIAGNOSIS — R809 Proteinuria, unspecified: Secondary | ICD-10-CM | POA: Insufficient documentation

## 2021-11-24 DIAGNOSIS — I129 Hypertensive chronic kidney disease with stage 1 through stage 4 chronic kidney disease, or unspecified chronic kidney disease: Secondary | ICD-10-CM | POA: Insufficient documentation

## 2021-11-24 DIAGNOSIS — E1129 Type 2 diabetes mellitus with other diabetic kidney complication: Secondary | ICD-10-CM | POA: Diagnosis not present

## 2021-11-24 DIAGNOSIS — E876 Hypokalemia: Secondary | ICD-10-CM | POA: Diagnosis not present

## 2021-11-24 DIAGNOSIS — E211 Secondary hyperparathyroidism, not elsewhere classified: Secondary | ICD-10-CM | POA: Insufficient documentation

## 2021-11-24 DIAGNOSIS — N185 Chronic kidney disease, stage 5: Secondary | ICD-10-CM | POA: Insufficient documentation

## 2021-11-24 LAB — IRON AND TIBC
Iron: 57 ug/dL (ref 28–170)
Saturation Ratios: 27 % (ref 10.4–31.8)
TIBC: 210 ug/dL — ABNORMAL LOW (ref 250–450)
UIBC: 153 ug/dL

## 2021-11-24 LAB — CBC WITH DIFFERENTIAL/PLATELET
Abs Immature Granulocytes: 0.01 10*3/uL (ref 0.00–0.07)
Basophils Absolute: 0 10*3/uL (ref 0.0–0.1)
Basophils Relative: 0 %
Eosinophils Absolute: 0.2 10*3/uL (ref 0.0–0.5)
Eosinophils Relative: 3 %
HCT: 27.3 % — ABNORMAL LOW (ref 36.0–46.0)
Hemoglobin: 9.1 g/dL — ABNORMAL LOW (ref 12.0–15.0)
Immature Granulocytes: 0 %
Lymphocytes Relative: 20 %
Lymphs Abs: 1.4 10*3/uL (ref 0.7–4.0)
MCH: 29 pg (ref 26.0–34.0)
MCHC: 33.3 g/dL (ref 30.0–36.0)
MCV: 86.9 fL (ref 80.0–100.0)
Monocytes Absolute: 0.4 10*3/uL (ref 0.1–1.0)
Monocytes Relative: 5 %
Neutro Abs: 5.2 10*3/uL (ref 1.7–7.7)
Neutrophils Relative %: 72 %
Platelets: 277 10*3/uL (ref 150–400)
RBC: 3.14 MIL/uL — ABNORMAL LOW (ref 3.87–5.11)
RDW: 13.4 % (ref 11.5–15.5)
WBC: 7.2 10*3/uL (ref 4.0–10.5)
nRBC: 0 % (ref 0.0–0.2)

## 2021-11-24 LAB — RENAL FUNCTION PANEL
Albumin: 3.7 g/dL (ref 3.5–5.0)
Anion gap: 12 (ref 5–15)
BUN: 78 mg/dL — ABNORMAL HIGH (ref 8–23)
CO2: 21 mmol/L — ABNORMAL LOW (ref 22–32)
Calcium: 9.4 mg/dL (ref 8.9–10.3)
Chloride: 102 mmol/L (ref 98–111)
Creatinine, Ser: 3.78 mg/dL — ABNORMAL HIGH (ref 0.44–1.00)
GFR, Estimated: 12 mL/min — ABNORMAL LOW (ref >60)
Glucose, Bld: 253 mg/dL — ABNORMAL HIGH (ref 70–99)
Phosphorus: 4.3 mg/dL (ref 2.5–4.6)
Potassium: 4.1 mmol/L (ref 3.5–5.1)
Sodium: 135 mmol/L (ref 135–145)

## 2021-11-24 LAB — PROTEIN / CREATININE RATIO, URINE
Creatinine, Urine: 65.22 mg/dL
Protein Creatinine Ratio: 1.2 mg/mg{Cre} — ABNORMAL HIGH (ref 0.00–0.15)
Total Protein, Urine: 78 mg/dL

## 2021-11-24 LAB — POCT HEMOGLOBIN-HEMACUE: Hemoglobin: 9.1 g/dL — ABNORMAL LOW (ref 12.0–15.0)

## 2021-11-24 LAB — FERRITIN: Ferritin: 310 ng/mL — ABNORMAL HIGH (ref 11–307)

## 2021-11-24 MED ORDER — EPOETIN ALFA-EPBX 3000 UNIT/ML IJ SOLN
INTRAMUSCULAR | Status: AC
  Filled 2021-11-24: qty 1

## 2021-11-24 MED ORDER — EPOETIN ALFA-EPBX 3000 UNIT/ML IJ SOLN
3000.0000 [IU] | Freq: Once | INTRAMUSCULAR | Status: AC
  Administered 2021-11-24: 3000 [IU] via SUBCUTANEOUS

## 2021-11-25 LAB — PTH, INTACT AND CALCIUM
Calcium, Total (PTH): 9.8 mg/dL (ref 8.7–10.3)
PTH: 160 pg/mL — ABNORMAL HIGH (ref 15–65)

## 2021-12-08 ENCOUNTER — Encounter (HOSPITAL_COMMUNITY)
Admission: RE | Admit: 2021-12-08 | Discharge: 2021-12-08 | Disposition: A | Discharge status: Home / Self Care | Payer: Medicare HMO | Source: Ambulatory Visit | Attending: Nephrology | Admitting: Nephrology

## 2021-12-08 VITALS — BP 142/73 | HR 63 | Temp 98.4°F | Resp 16

## 2021-12-08 DIAGNOSIS — N184 Chronic kidney disease, stage 4 (severe): Secondary | ICD-10-CM

## 2021-12-08 DIAGNOSIS — I129 Hypertensive chronic kidney disease with stage 1 through stage 4 chronic kidney disease, or unspecified chronic kidney disease: Secondary | ICD-10-CM | POA: Diagnosis not present

## 2021-12-08 LAB — POCT HEMOGLOBIN-HEMACUE: Hemoglobin: 10.2 g/dL — ABNORMAL LOW (ref 12.0–15.0)

## 2021-12-08 MED ORDER — EPOETIN ALFA-EPBX 3000 UNIT/ML IJ SOLN: 3000.0000 [IU] | Freq: Once | INTRAMUSCULAR | Status: DC

## 2021-12-22 ENCOUNTER — Encounter (HOSPITAL_COMMUNITY)
Admission: RE | Admit: 2021-12-22 | Discharge: 2021-12-22 | Disposition: A | Discharge status: Home / Self Care | Payer: Medicare HMO | Source: Ambulatory Visit | Attending: Nephrology | Admitting: Nephrology

## 2021-12-22 ENCOUNTER — Encounter (HOSPITAL_COMMUNITY): Payer: Self-pay

## 2021-12-22 VITALS — BP 135/70 | HR 67 | Temp 97.8°F | Resp 16 | Ht 64.0 in | Wt 148.2 lb

## 2021-12-22 DIAGNOSIS — N182 Chronic kidney disease, stage 2 (mild): Secondary | ICD-10-CM | POA: Diagnosis present

## 2021-12-22 DIAGNOSIS — N1831 Chronic kidney disease, stage 3a: Secondary | ICD-10-CM | POA: Insufficient documentation

## 2021-12-22 LAB — POCT HEMOGLOBIN-HEMACUE: Hemoglobin: 9.7 g/dL — ABNORMAL LOW (ref 12.0–15.0)

## 2021-12-22 MED ORDER — EPOETIN ALFA-EPBX 2000 UNIT/ML IJ SOLN
INTRAMUSCULAR | Status: AC
  Administered 2021-12-22: 4000 [IU]
  Filled 2021-12-22: qty 2

## 2021-12-22 MED ORDER — EPOETIN ALFA-EPBX 2000 UNIT/ML IJ SOLN: 4000.0000 [IU] | Freq: Once | INTRAMUSCULAR | Status: DC

## 2021-12-30 LAB — LIPID PANEL
Chol/HDL Ratio: 5.3 ratio — ABNORMAL HIGH (ref 0.0–4.4)
Cholesterol, Total: 212 mg/dL — ABNORMAL HIGH (ref 100–199)
HDL: 40 mg/dL (ref >39)
LDL Chol Calc (NIH): 145 mg/dL — ABNORMAL HIGH (ref 0–99)
Triglycerides: 150 mg/dL — ABNORMAL HIGH (ref 0–149)
VLDL Cholesterol Cal: 27 mg/dL (ref 5–40)

## 2021-12-30 LAB — T4, FREE: Free T4: 1.04 ng/dL (ref 0.82–1.77)

## 2021-12-30 LAB — COMPREHENSIVE METABOLIC PANEL
ALT: 8 IU/L (ref 0–32)
AST: 10 IU/L (ref 0–40)
Albumin/Globulin Ratio: 1.2 (ref 1.2–2.2)
Albumin: 4 g/dL (ref 3.7–4.7)
Alkaline Phosphatase: 95 IU/L (ref 44–121)
BUN/Creatinine Ratio: 18 (ref 12–28)
BUN: 72 mg/dL — ABNORMAL HIGH (ref 8–27)
Bilirubin Total: <0.2 mg/dL (ref 0.0–1.2)
CO2: 20 mmol/L (ref 20–29)
Calcium: 10.1 mg/dL (ref 8.7–10.3)
Chloride: 101 mmol/L (ref 96–106)
Creatinine, Ser: 4.01 mg/dL — ABNORMAL HIGH (ref 0.57–1.00)
Globulin, Total: 3.3 g/dL (ref 1.5–4.5)
Glucose: 207 mg/dL — ABNORMAL HIGH (ref 70–99)
Potassium: 4.6 mmol/L (ref 3.5–5.2)
Sodium: 138 mmol/L (ref 134–144)
Total Protein: 7.3 g/dL (ref 6.0–8.5)
eGFR: 11 mL/min/{1.73_m2} — ABNORMAL LOW (ref >59)

## 2021-12-30 LAB — TSH: TSH: 1.93 u[IU]/mL (ref 0.450–4.500)

## 2022-01-03 ENCOUNTER — Encounter: Payer: Self-pay | Admitting: "Endocrinology

## 2022-01-03 ENCOUNTER — Ambulatory Visit (INDEPENDENT_AMBULATORY_CARE_PROVIDER_SITE_OTHER): Payer: Medicare HMO | Admitting: "Endocrinology

## 2022-01-03 VITALS — BP 126/64 | HR 60 | Ht 64.0 in | Wt 145.6 lb

## 2022-01-03 DIAGNOSIS — Z794 Long term (current) use of insulin: Secondary | ICD-10-CM | POA: Diagnosis not present

## 2022-01-03 DIAGNOSIS — Z789 Other specified health status: Secondary | ICD-10-CM

## 2022-01-03 DIAGNOSIS — E782 Mixed hyperlipidemia: Secondary | ICD-10-CM | POA: Diagnosis not present

## 2022-01-03 DIAGNOSIS — E119 Type 2 diabetes mellitus without complications: Secondary | ICD-10-CM

## 2022-01-03 DIAGNOSIS — I10 Essential (primary) hypertension: Secondary | ICD-10-CM | POA: Diagnosis not present

## 2022-01-03 LAB — POCT GLYCOSYLATED HEMOGLOBIN (HGB A1C): HbA1c, POC (controlled diabetic range): 8.4 % — AB (ref 0.0–7.0)

## 2022-01-03 MED ORDER — TRESIBA FLEXTOUCH 100 UNIT/ML ~~LOC~~ SOPN
30.0000 [IU] | PEN_INJECTOR | Freq: Every day | SUBCUTANEOUS | 1 refills | Status: DC
Start: 2022-01-03 — End: 2022-05-18

## 2022-01-03 NOTE — Progress Notes (Signed)
01/03/2022  Endocrinology follow-up note  Subjective:    Patient ID: Maria Parrish, female    DOB: 08-19-1945, PCP Vidal Schwalbe, MD   Past Medical History:  Diagnosis Date   Assistance needed for mobility    walks with walker   Confusion    Dementia ([place])    Diabetes mellitus without complication ([place])    Generalized weakness    Hypercalcemia    Hyperlipidemia    Hyperparathyroidism ([place])    Hypertension    Hypertension    Moderate protein-calorie malnutrition ([place])    Peripheral neuropathy    UTI (lower urinary tract infection)    Past Surgical History:  Procedure Laterality Date   COLONOSCOPY N/A 08/12/2015   Procedure: COLONOSCOPY;  Surgeon: Daneil Dolin, MD;  [facility];  Service: Endoscopy;  Laterality: N/A;  1245pm   ESOPHAGOGASTRODUODENOSCOPY N/A 08/12/2015   Procedure: ESOPHAGOGASTRODUODENOSCOPY (EGD);  Surgeon: Daneil Dolin, MD;  [facility];  Service: Endoscopy;  Laterality: N/A;   none     Social History   Socioeconomic History   Marital status: Widowed    Spouse name: Not on file   Number of children: 2   Years of education: Not on file   Highest education level: Not on file  Occupational History   Occupation: fabricator for [employer]  Tobacco Use   Smoking status: Never   Smokeless tobacco: Never  Vaping Use   Vaping Use: Never used  Substance and Sexual Activity   Alcohol use: No   Drug use: No   Sexual activity: Not Currently  Other Topics Concern   Not on file  Social History Narrative   Not on file   Social Determinants of Health   Financial Resource Strain: Not on file  Food Insecurity: Not on file  Transportation Needs: No Transportation Needs (08/20/2020)   PRAPARE - Transportation    Lack of Transportation (Medical): No    Lack of Transportation (Non-Medical): No  Physical Activity: Inactive (08/20/2020)   Exercise Vital Sign    Days of Exercise per Week: 0 days    Minutes of Exercise per Session: 0 min   Stress: Not on file  Social Connections: Not on file   Outpatient Encounter Medications as of 01/03/2022  Medication Sig   amLODipine (NORVASC) 10 MG tablet Take 1 tablet (10 mg total) by mouth daily.   aspirin EC 81 MG tablet Take 81 mg by mouth daily.   Azelastine HCl 137 MCG/SPRAY SOLN Place into both nostrils.   B-D ULTRAFINE III SHORT PEN 31G X 8 MM MISC USE 1 PEN NEEDLE ONCE DAILY AS DIRECTED   calcitRIOL (ROCALTROL) 0.25 MCG capsule Take 1 capsule (0.25 mcg total) by mouth daily.   carvedilol (COREG) 12.5 MG tablet Take 1 tablet (12.5 mg total) by mouth 2 (two) times daily.   Cyanocobalamin (VITAMIN B-12 PO) Take 1 tablet by mouth daily.   ezetimibe (ZETIA) 10 MG tablet daily.   FEROSUL 325 (65 Fe) MG tablet Take 325 mg by mouth 2 (two) times daily. (Patient not taking: Reported on 09/23/2021)   furosemide (LASIX) 80 MG tablet Take 40 mg by mouth 2 (two) times daily.   hydrALAZINE (APRESOLINE) 100 MG tablet Take 1 tablet (100 mg total) by mouth 2 (two) times daily.   insulin degludec (TRESIBA FLEXTOUCH) 100 UNIT/ML FlexTouch Pen Inject 30 Units into the skin at bedtime.   isosorbide mononitrate (IMDUR) 30 MG 24 hr tablet Take 1 tablet (30 mg total) by mouth  daily.   potassium chloride (KLOR-CON) 10 MEQ tablet Take 10 mEq by mouth daily.   sodium bicarbonate 650 MG tablet See admin instructions.   [DISCONTINUED] insulin degludec (TRESIBA FLEXTOUCH) 100 UNIT/ML FlexTouch Pen Inject 25 Units into the skin at bedtime.   No facility-administered encounter medications on file as of 01/03/2022.   ALLERGIES: Allergies  Allergen Reactions   Penicillins Other (See Comments)    Makes patient sore.  Has patient had a PCN reaction causing immediate rash, facial/tongue/throat swelling, SOB or lightheadedness with hypotension: No Has patient had a PCN reaction causing severe rash involving mucus membranes or skin necrosis: No Has patient had a PCN reaction that required hospitalization No Has  patient had a PCN reaction occurring within the last 10 years: No If all of the above answers are "NO", then may proceed with Cephalosporin use.    Statins Other (See Comments)    Makes wrists and fingers ache Other reaction(s): Muscle Pain   Statins Support [Acid Blockers Support]     Other reaction(s): joint pain   VACCINATION STATUS:  There is no immunization history on file for this patient.  Diabetes   -76 year old female patient with medical history as above.   She was originally seen for secondary hyperparathyroidism with elevated PTH as a result of CKD.  She did not require active intervention, more recent labs show acceptable PTH range between 110-130, with calcium remaining normal at 9.4-10 mg per DL.  She is following with nephrology, for advancing CKD, she is only on observation management for this.     -She is being seen in follow-up for management of her currently and chronically  uncontrolled, type 2 diabetes.   After reluctance for several months, she accepted low-dose basal insulin.  During her last visit, she was advised to take Tresiba 25 units nightly, presents with blood glucose readings incomplete, however significantly above target.  Her point-of-care A1c is 8.4% . She denies any hypoglycemia.    Due to worsening CKD, she was taken off of Metformin during her recent hospitalization.  She has no new complaints today.  She feels better. - Her thyroid ultrasound shows mild goiter with no discrete nodules. - She has no new complaints.  Review of Systems Limited as above.   Objective:    BP 126/64   Pulse 60   Ht 5\' 4"  (1.626 m)   Wt 145 lb 9.6 oz (66 kg)   BMI 24.99 kg/m   Wt Readings from Last 3 Encounters:  01/03/22 145 lb 9.6 oz (66 kg)  12/22/21 148 lb 3.2 oz (67.2 kg)  11/24/21 148 lb 2.4 oz (67.2 kg)        Latest Ref Rng & Units 12/29/2021    1:03 PM 11/24/2021    3:08 PM 10/13/2021    3:20 PM  CMP  Glucose 70 - 99 mg/dL 207  253  296   BUN 8  - 27 mg/dL 72  78  58   Creatinine 0.57 - 1.00 mg/dL 4.01  3.78  3.27   Sodium 134 - 144 mmol/L 138  135  136   Potassium 3.5 - 5.2 mmol/L 4.6  4.1  3.3   Chloride 96 - 106 mmol/L 101  102  98   CO2 20 - 29 mmol/L 20  21  26    Calcium 8.7 - 10.3 mg/dL 10.1  9.4    9.8  9.3   Total Protein 6.0 - 8.5 g/dL 7.3     Total Bilirubin 0.0 -  1.2 mg/dL <0.2     Alkaline Phos 44 - 121 IU/L 95     AST 0 - 40 IU/L 10     ALT 0 - 32 IU/L 8        Diabetic Labs (most recent): Lab Results  Component Value Date   HGBA1C 8.4 (A) 01/03/2022   HGBA1C 8.8 (A) 09/28/2021   HGBA1C 8.3 (A) 06/30/2021   Lipid Panel     Component Value Date/Time   CHOL 212 (H) 12/29/2021 1303   TRIG 150 (H) 12/29/2021 1303   HDL 40 12/29/2021 1303   CHOLHDL 5.3 (H) 12/29/2021 1303   CHOLHDL 4.8 09/23/2015 0537   VLDL 19 09/23/2015 0537   LDLCALC 145 (H) 12/29/2021 1303   LABVLDL 27 12/29/2021 1303      Assessment & Plan:   1. Uncontrolled type 2 diabetes mellitus without complication, without long-term current use of insulin ([place])  She is not monitoring BG adequately. Her a1c is 8.8%. She is at high risk for hypoglycemia.   -She is advised to increase Tresiba to 30 units nightly, associated with monitoring of blood glucose twice a day-daily before breakfast and at bedtime.    She is not a candidate for metformin.  She is encouraged to call clinic for blood glucose less than 70 or  greater than  200 mg/dL.   -She does not have a safe oral regimen to manage diabetes.   - she acknowledges that there is a room for improvement in her food and drink choices. - Suggestion is made for her to avoid simple carbohydrates  from her diet including Cakes, Sweet Desserts, Ice Cream, Soda (diet and regular), Sweet Tea, Candies, Chips, Cookies, Store Bought Juices, Alcohol in Excess of  1-2 drinks a day, Artificial Sweeteners,  Coffee Creamer, and "Sugar-free" Products, Lemonade. This will help patient to have more stable  blood glucose profile and potentially avoid unintended weight gain.  2. Hypercalcemia: Resolved  - Her repeat labs show normal calcium of 9.0,  PTH remaining stable between 110-127.  this combination is likely a result of CKD. - She is vitamin D replete now . -She will not need any intervention for at this time. - Her neck/thyroid ultrasound was unremarkable. -She will be considered for 24-hour urine calcium and creatinine measurement on subsequent visits. -She is encouraged to stay close to her nephrologist.   3. Essential hypertension  -Her blood pressure is not controlled to target.  She reports that she has not taken her medication this morning.  She has lisinopril 30 mg p.o. daily at breakfast, metoprolol 25 mg p.o. daily, isosorbide 30 mg p.o. daily, hydralazine 100 mg p.o. twice daily, amlodipine 5 mg p.o. daily, and Lasix as needed.  She will not need more prescriptions.  She is advised to be consistent with her available medications.  She was given refills on her  isosorbide based on her request.   - She is hesitant to add any additional therapy, but willing to discuss this with her PMD.   4. Vitamin D deficiency: See #1 above. -She is status post therapy with vitamin D, currently at 28 ng/dL.  She is given a refill on her calcitriol 0.5 mg p.o. daily.   5. Goiter -Her thyroid ultrasound showed mild goiter with no discrete nodules. No intervention needed at this point.   6) peripheral arterial disease on screening ABI -Patient will benefit from more directed assessment, will be referred to vascular surgery.    - I advised patient  to maintain close follow up with Vidal Schwalbe, MD for primary care needs.   I spent 31 minutes in the care of the patient today including review of labs from [place], Lipids, Thyroid Function, Hematology (current and previous including abstractions from other facilities); face-to-face time discussing  her blood glucose readings/logs, discussing  hypoglycemia and hyperglycemia episodes and symptoms, medications doses, her options of short and long term treatment based on the latest standards of care / guidelines;  discussion about incorporating lifestyle medicine;  and documenting the encounter.    Please refer to Patient Instructions for Blood Glucose Monitoring and Insulin/Medications Dosing Guide"  in media tab for additional information. Please  also refer to " Patient Self Inventory" in the Media  tab for reviewed elements of pertinent patient history.  Daniel M Ausmus participated in the discussions, expressed understanding, and voiced agreement with the above plans.  All questions were answered to her satisfaction. she is encouraged to contact clinic should she have any questions or concerns prior to her return visit.   Follow up plan: Return in about 4 months (around 05/05/2022) for Bring Meter/CGM Device/Logs- A1c in Office.  Glade Lloyd, MD Phone: [phone]  Fax: [phone]  -  This note was partially dictated with voice recognition software. Similar sounding words can be transcribed inadequately or may not  be corrected upon review.  01/03/2022, 6:33 PM

## 2022-01-03 NOTE — Patient Instructions (Signed)

## 2022-01-05 ENCOUNTER — Encounter (HOSPITAL_COMMUNITY)
Admission: RE | Admit: 2022-01-05 | Discharge: 2022-01-05 | Disposition: A | Discharge status: Home / Self Care | Payer: Medicare HMO | Source: Ambulatory Visit | Attending: Nephrology | Admitting: Nephrology

## 2022-01-05 ENCOUNTER — Encounter (HOSPITAL_COMMUNITY): Payer: Self-pay

## 2022-01-05 VITALS — BP 144/65 | HR 65 | Temp 97.8°F | Resp 18 | Ht 64.0 in | Wt 145.6 lb

## 2022-01-05 DIAGNOSIS — N182 Chronic kidney disease, stage 2 (mild): Secondary | ICD-10-CM

## 2022-01-05 DIAGNOSIS — N1831 Chronic kidney disease, stage 3a: Secondary | ICD-10-CM | POA: Diagnosis not present

## 2022-01-05 LAB — CBC WITH DIFFERENTIAL/PLATELET
Abs Immature Granulocytes: 0.02 10*3/uL (ref 0.00–0.07)
Basophils Absolute: 0.1 10*3/uL (ref 0.0–0.1)
Basophils Relative: 1 %
Eosinophils Absolute: 0.4 10*3/uL (ref 0.0–0.5)
Eosinophils Relative: 5 %
HCT: 28.9 % — ABNORMAL LOW (ref 36.0–46.0)
Hemoglobin: 9.8 g/dL — ABNORMAL LOW (ref 12.0–15.0)
Immature Granulocytes: 0 %
Lymphocytes Relative: 18 %
Lymphs Abs: 1.4 10*3/uL (ref 0.7–4.0)
MCH: 29.5 pg (ref 26.0–34.0)
MCHC: 33.9 g/dL (ref 30.0–36.0)
MCV: 87 fL (ref 80.0–100.0)
Monocytes Absolute: 0.4 10*3/uL (ref 0.1–1.0)
Monocytes Relative: 5 %
Neutro Abs: 5.4 10*3/uL (ref 1.7–7.7)
Neutrophils Relative %: 71 %
Platelets: 282 10*3/uL (ref 150–400)
RBC: 3.32 MIL/uL — ABNORMAL LOW (ref 3.87–5.11)
RDW: 13.8 % (ref 11.5–15.5)
WBC: 7.6 10*3/uL (ref 4.0–10.5)
nRBC: 0 % (ref 0.0–0.2)

## 2022-01-05 LAB — RENAL FUNCTION PANEL
Albumin: 3.7 g/dL (ref 3.5–5.0)
Anion gap: 8 (ref 5–15)
BUN: 68 mg/dL — ABNORMAL HIGH (ref 8–23)
CO2: 22 mmol/L (ref 22–32)
Calcium: 9.4 mg/dL (ref 8.9–10.3)
Chloride: 102 mmol/L (ref 98–111)
Creatinine, Ser: 3.99 mg/dL — ABNORMAL HIGH (ref 0.44–1.00)
GFR, Estimated: 11 mL/min — ABNORMAL LOW (ref >60)
Glucose, Bld: 347 mg/dL — ABNORMAL HIGH (ref 70–99)
Phosphorus: 3.9 mg/dL (ref 2.5–4.6)
Potassium: 4.2 mmol/L (ref 3.5–5.1)
Sodium: 132 mmol/L — ABNORMAL LOW (ref 135–145)

## 2022-01-05 LAB — POCT HEMOGLOBIN-HEMACUE: Hemoglobin: 9.9 g/dL — ABNORMAL LOW (ref 12.0–15.0)

## 2022-01-05 LAB — PROTEIN / CREATININE RATIO, URINE
Creatinine, Urine: 55.7 mg/dL
Protein Creatinine Ratio: 0.88 mg/mg{Cre} — ABNORMAL HIGH (ref 0.00–0.15)
Total Protein, Urine: 49 mg/dL

## 2022-01-05 MED ORDER — EPOETIN ALFA-EPBX 10000 UNIT/ML IJ SOLN
4000.0000 [IU] | Freq: Once | INTRAMUSCULAR | Status: AC
  Administered 2022-01-05: 4000 [IU] via SUBCUTANEOUS
  Filled 2022-01-05: qty 1

## 2022-01-19 ENCOUNTER — Encounter (HOSPITAL_COMMUNITY)
Admission: RE | Admit: 2022-01-19 | Discharge: 2022-01-19 | Disposition: A | Discharge status: Home / Self Care | Payer: Medicare HMO | Source: Ambulatory Visit | Attending: Nephrology | Admitting: Nephrology

## 2022-01-19 VITALS — BP 125/72 | Resp 16

## 2022-01-19 DIAGNOSIS — D631 Anemia in chronic kidney disease: Secondary | ICD-10-CM | POA: Diagnosis not present

## 2022-01-19 DIAGNOSIS — N185 Chronic kidney disease, stage 5: Secondary | ICD-10-CM | POA: Diagnosis present

## 2022-01-19 LAB — POCT HEMOGLOBIN-HEMACUE: Hemoglobin: 9.9 g/dL — ABNORMAL LOW (ref 12.0–15.0)

## 2022-01-19 MED ORDER — EPOETIN ALFA-EPBX 10000 UNIT/ML IJ SOLN: 4000.0000 [IU] | Freq: Once | INTRAMUSCULAR | Status: DC

## 2022-01-19 MED ORDER — EPOETIN ALFA-EPBX 2000 UNIT/ML IJ SOLN
INTRAMUSCULAR | Status: AC
  Administered 2022-01-19: 4000 [IU]
  Filled 2022-01-19: qty 2

## 2022-02-02 ENCOUNTER — Encounter (HOSPITAL_COMMUNITY)
Admission: RE | Admit: 2022-02-02 | Discharge: 2022-02-02 | Disposition: A | Discharge status: Home / Self Care | Payer: Medicare HMO | Source: Ambulatory Visit | Attending: Emergency Medicine | Admitting: Emergency Medicine

## 2022-02-02 VITALS — BP 129/67 | HR 66 | Temp 97.9°F | Resp 18 | Ht 64.0 in | Wt 145.6 lb

## 2022-02-02 DIAGNOSIS — N185 Chronic kidney disease, stage 5: Secondary | ICD-10-CM | POA: Diagnosis not present

## 2022-02-02 MED ORDER — EPOETIN ALFA 4000 UNIT/ML IJ SOLN
INTRAMUSCULAR | Status: AC
  Administered 2022-02-02: 4000 [IU]
  Filled 2022-02-02: qty 1

## 2022-02-02 MED ORDER — EPOETIN ALFA-EPBX 10000 UNIT/ML IJ SOLN: 4000.0000 [IU] | Freq: Once | INTRAMUSCULAR | Status: DC

## 2022-02-07 LAB — POCT HEMOGLOBIN-HEMACUE: Hemoglobin: 9.5 g/dL — ABNORMAL LOW (ref 12.0–15.0)

## 2022-02-11 ENCOUNTER — Other Ambulatory Visit: Payer: Self-pay | Admitting: Student

## 2022-02-11 ENCOUNTER — Other Ambulatory Visit: Payer: Self-pay | Admitting: "Endocrinology

## 2022-02-16 ENCOUNTER — Encounter (HOSPITAL_COMMUNITY)
Admission: RE | Admit: 2022-02-16 | Discharge: 2022-02-16 | Disposition: A | Discharge status: Home / Self Care | Payer: Medicare HMO | Source: Ambulatory Visit | Attending: Nephrology | Admitting: Nephrology

## 2022-02-16 VITALS — BP 110/55 | HR 69 | Temp 97.6°F | Resp 18

## 2022-02-16 DIAGNOSIS — N185 Chronic kidney disease, stage 5: Secondary | ICD-10-CM | POA: Diagnosis not present

## 2022-02-16 LAB — POCT HEMOGLOBIN-HEMACUE: Hemoglobin: 10.1 g/dL — ABNORMAL LOW (ref 12.0–15.0)

## 2022-02-16 MED ORDER — EPOETIN ALFA-EPBX 10000 UNIT/ML IJ SOLN: 4000.0000 [IU] | Freq: Once | INTRAMUSCULAR | Status: DC

## 2022-03-02 ENCOUNTER — Encounter (HOSPITAL_COMMUNITY)
Admission: RE | Admit: 2022-03-02 | Discharge: 2022-03-02 | Disposition: A | Discharge status: Home / Self Care | Payer: Medicare HMO | Source: Ambulatory Visit | Attending: Emergency Medicine | Admitting: Emergency Medicine

## 2022-03-02 ENCOUNTER — Encounter (HOSPITAL_COMMUNITY): Payer: Self-pay

## 2022-03-02 VITALS — BP 123/70 | HR 66 | Temp 98.0°F | Resp 18 | Ht 64.0 in | Wt 145.6 lb

## 2022-03-02 DIAGNOSIS — N185 Chronic kidney disease, stage 5: Secondary | ICD-10-CM

## 2022-03-02 LAB — CBC WITH DIFFERENTIAL/PLATELET
Abs Immature Granulocytes: 0.04 10*3/uL (ref 0.00–0.07)
Basophils Absolute: 0 10*3/uL (ref 0.0–0.1)
Basophils Relative: 0 %
Eosinophils Absolute: 0.3 10*3/uL (ref 0.0–0.5)
Eosinophils Relative: 3 %
HCT: 26.3 % — ABNORMAL LOW (ref 36.0–46.0)
Hemoglobin: 8.9 g/dL — ABNORMAL LOW (ref 12.0–15.0)
Immature Granulocytes: 0 %
Lymphocytes Relative: 18 %
Lymphs Abs: 1.6 10*3/uL (ref 0.7–4.0)
MCH: 29.6 pg (ref 26.0–34.0)
MCHC: 33.8 g/dL (ref 30.0–36.0)
MCV: 87.4 fL (ref 80.0–100.0)
Monocytes Absolute: 0.5 10*3/uL (ref 0.1–1.0)
Monocytes Relative: 5 %
Neutro Abs: 6.7 10*3/uL (ref 1.7–7.7)
Neutrophils Relative %: 74 %
Platelets: 278 10*3/uL (ref 150–400)
RBC: 3.01 MIL/uL — ABNORMAL LOW (ref 3.87–5.11)
RDW: 13.2 % (ref 11.5–15.5)
WBC: 9.2 10*3/uL (ref 4.0–10.5)
nRBC: 0 % (ref 0.0–0.2)

## 2022-03-02 LAB — PROTEIN / CREATININE RATIO, URINE
Creatinine, Urine: 77.27 mg/dL
Protein Creatinine Ratio: 0.52 mg/mg{Cre} — ABNORMAL HIGH (ref 0.00–0.15)
Total Protein, Urine: 40 mg/dL

## 2022-03-02 LAB — RENAL FUNCTION PANEL
Albumin: 3.4 g/dL — ABNORMAL LOW (ref 3.5–5.0)
Anion gap: 9 (ref 5–15)
BUN: 94 mg/dL — ABNORMAL HIGH (ref 8–23)
CO2: 22 mmol/L (ref 22–32)
Calcium: 9.5 mg/dL (ref 8.9–10.3)
Chloride: 100 mmol/L (ref 98–111)
Creatinine, Ser: 4.48 mg/dL — ABNORMAL HIGH (ref 0.44–1.00)
GFR, Estimated: 10 mL/min — ABNORMAL LOW (ref >60)
Glucose, Bld: 292 mg/dL — ABNORMAL HIGH (ref 70–99)
Phosphorus: 3.6 mg/dL (ref 2.5–4.6)
Potassium: 4.4 mmol/L (ref 3.5–5.1)
Sodium: 131 mmol/L — ABNORMAL LOW (ref 135–145)

## 2022-03-02 LAB — POCT HEMOGLOBIN-HEMACUE: Hemoglobin: 8.7 g/dL — ABNORMAL LOW (ref 12.0–15.0)

## 2022-03-02 MED ORDER — EPOETIN ALFA 10000 UNIT/ML IJ SOLN: 10000.0000 [IU] | Freq: Once | INTRAMUSCULAR | Status: AC

## 2022-03-02 MED ORDER — EPOETIN ALFA 10000 UNIT/ML IJ SOLN
INTRAMUSCULAR | Status: AC
  Administered 2022-03-02: 10000 [IU]
  Filled 2022-03-02: qty 1

## 2022-03-02 MED ORDER — EPOETIN ALFA-EPBX 10000 UNIT/ML IJ SOLN: 4000.0000 [IU] | Freq: Once | INTRAMUSCULAR | Status: DC

## 2022-03-16 ENCOUNTER — Encounter (HOSPITAL_COMMUNITY)
Admission: RE | Admit: 2022-03-16 | Discharge: 2022-03-16 | Disposition: A | Discharge status: Home / Self Care | Payer: Medicare HMO | Source: Ambulatory Visit | Attending: Emergency Medicine | Admitting: Emergency Medicine

## 2022-03-16 VITALS — BP 140/63 | HR 66 | Temp 98.0°F | Resp 18

## 2022-03-16 DIAGNOSIS — N185 Chronic kidney disease, stage 5: Secondary | ICD-10-CM

## 2022-03-16 LAB — POCT HEMOGLOBIN-HEMACUE: Hemoglobin: 11 g/dL — ABNORMAL LOW (ref 12.0–15.0)

## 2022-03-16 MED ORDER — EPOETIN ALFA-EPBX 10000 UNIT/ML IJ SOLN: 4000.0000 [IU] | Freq: Once | INTRAMUSCULAR | Status: DC

## 2022-03-27 ENCOUNTER — Other Ambulatory Visit (HOSPITAL_COMMUNITY): Payer: Self-pay | Admitting: Nephrology

## 2022-03-27 ENCOUNTER — Other Ambulatory Visit: Payer: Self-pay | Admitting: Nephrology

## 2022-03-27 DIAGNOSIS — E1122 Type 2 diabetes mellitus with diabetic chronic kidney disease: Secondary | ICD-10-CM

## 2022-03-27 DIAGNOSIS — D638 Anemia in other chronic diseases classified elsewhere: Secondary | ICD-10-CM

## 2022-03-27 DIAGNOSIS — E1129 Type 2 diabetes mellitus with other diabetic kidney complication: Secondary | ICD-10-CM

## 2022-03-27 DIAGNOSIS — N170 Acute kidney failure with tubular necrosis: Secondary | ICD-10-CM

## 2022-03-27 DIAGNOSIS — I129 Hypertensive chronic kidney disease with stage 1 through stage 4 chronic kidney disease, or unspecified chronic kidney disease: Secondary | ICD-10-CM

## 2022-03-30 ENCOUNTER — Encounter (HOSPITAL_COMMUNITY)
Admission: RE | Admit: 2022-03-30 | Discharge: 2022-03-30 | Disposition: A | Discharge status: Home / Self Care | Payer: Medicare HMO | Source: Ambulatory Visit | Attending: Nephrology | Admitting: Nephrology

## 2022-03-30 ENCOUNTER — Encounter (HOSPITAL_COMMUNITY): Payer: Self-pay

## 2022-03-30 VITALS — BP 142/91 | HR 64 | Temp 98.0°F | Resp 18 | Ht 64.0 in | Wt 145.6 lb

## 2022-03-30 DIAGNOSIS — N184 Chronic kidney disease, stage 4 (severe): Secondary | ICD-10-CM | POA: Diagnosis present

## 2022-03-30 DIAGNOSIS — I5032 Chronic diastolic (congestive) heart failure: Secondary | ICD-10-CM | POA: Insufficient documentation

## 2022-03-30 DIAGNOSIS — I10 Essential (primary) hypertension: Secondary | ICD-10-CM | POA: Insufficient documentation

## 2022-03-30 DIAGNOSIS — E782 Mixed hyperlipidemia: Secondary | ICD-10-CM | POA: Diagnosis present

## 2022-03-30 LAB — POCT HEMOGLOBIN-HEMACUE: Hemoglobin: 10.3 g/dL — ABNORMAL LOW (ref 12.0–15.0)

## 2022-03-30 MED ORDER — EPOETIN ALFA-EPBX 10000 UNIT/ML IJ SOLN: 4000.0000 [IU] | Freq: Once | INTRAMUSCULAR | Status: DC

## 2022-04-03 ENCOUNTER — Ambulatory Visit (HOSPITAL_COMMUNITY)
Admission: RE | Admit: 2022-04-03 | Discharge: 2022-04-03 | Disposition: A | Discharge status: Home / Self Care | Payer: Medicare HMO | Source: Ambulatory Visit | Attending: Nephrology | Admitting: Nephrology

## 2022-04-03 DIAGNOSIS — R809 Proteinuria, unspecified: Secondary | ICD-10-CM | POA: Insufficient documentation

## 2022-04-03 DIAGNOSIS — D638 Anemia in other chronic diseases classified elsewhere: Secondary | ICD-10-CM | POA: Diagnosis present

## 2022-04-03 DIAGNOSIS — I129 Hypertensive chronic kidney disease with stage 1 through stage 4 chronic kidney disease, or unspecified chronic kidney disease: Secondary | ICD-10-CM | POA: Diagnosis present

## 2022-04-03 DIAGNOSIS — I120 Hypertensive chronic kidney disease with stage 5 chronic kidney disease or end stage renal disease: Secondary | ICD-10-CM | POA: Diagnosis present

## 2022-04-03 DIAGNOSIS — N170 Acute kidney failure with tubular necrosis: Secondary | ICD-10-CM | POA: Insufficient documentation

## 2022-04-03 DIAGNOSIS — N185 Chronic kidney disease, stage 5: Secondary | ICD-10-CM | POA: Insufficient documentation

## 2022-04-03 DIAGNOSIS — E1122 Type 2 diabetes mellitus with diabetic chronic kidney disease: Secondary | ICD-10-CM | POA: Diagnosis present

## 2022-04-03 DIAGNOSIS — E1129 Type 2 diabetes mellitus with other diabetic kidney complication: Secondary | ICD-10-CM | POA: Insufficient documentation

## 2022-04-06 ENCOUNTER — Encounter: Payer: Self-pay | Admitting: Cardiology

## 2022-04-06 ENCOUNTER — Encounter (INDEPENDENT_AMBULATORY_CARE_PROVIDER_SITE_OTHER): Payer: Medicare HMO | Admitting: Cardiology

## 2022-04-06 VITALS — BP 140/75 | HR 67 | Ht 61.0 in | Wt 146.2 lb

## 2022-04-06 DIAGNOSIS — E782 Mixed hyperlipidemia: Secondary | ICD-10-CM | POA: Diagnosis not present

## 2022-04-06 DIAGNOSIS — I5032 Chronic diastolic (congestive) heart failure: Secondary | ICD-10-CM

## 2022-04-06 DIAGNOSIS — I10 Essential (primary) hypertension: Secondary | ICD-10-CM | POA: Diagnosis not present

## 2022-04-06 MED ORDER — EZETIMIBE 10 MG PO TABS: 10.0000 mg | ORAL_TABLET | Freq: Every day | ORAL | 3 refills | Status: DC

## 2022-04-06 NOTE — Progress Notes (Signed)
Clinical Summary Ms. Legner is a 76 y.o.female seen today for follow up of the following medical problems.    1.Chronic combined systolic/diastolic HF 08/6376 echo LVEF 55-60%, grade I dd -05/2020 echo shows her EF is now reduced at 40-45% with Grade 2 DD and global HK 06/2021 echo LVEF 55-60%, grade I dd  - medical therapy has been limited by renal dysfunction   - no SOB/DOE, no LE edema - compliant with meds - home weights stable around 146 lbs.    2. CKD IV - followed by Dr Theador Hawthorne   3. HTN - compliant with meds  01/2022 renal 130/60 - 03/25/22 118/60      4. Hyperlipidemia - labs followed by pcp - she is on zetia 10mg  daily. Reports has not been getting from pharmacy.  -intolerant to statins, reports tried multiple statins.  12/2021 TC 212 TG 150 LDL 145 (was off zetia at this time)     Past Medical History:  Diagnosis Date   Assistance needed for mobility    walks with walker   Confusion    Dementia ([place])    Diabetes mellitus without complication (HCC)    Generalized weakness    Hypercalcemia    Hyperlipidemia    Hyperparathyroidism ([place])    Hypertension    Hypertension    Moderate protein-calorie malnutrition ([place])    Peripheral neuropathy    UTI (lower urinary tract infection)      Allergies  Allergen Reactions   Penicillins Other (See Comments)    Makes patient sore.  Has patient had a PCN reaction causing immediate rash, facial/tongue/throat swelling, SOB or lightheadedness with hypotension: No Has patient had a PCN reaction causing severe rash involving mucus membranes or skin necrosis: No Has patient had a PCN reaction that required hospitalization No Has patient had a PCN reaction occurring within the last 10 years: No If all of the above answers are "NO", then may proceed with Cephalosporin use.    Statins Other (See Comments)    Makes wrists and fingers ache Other reaction(s): Muscle Pain   Statins Support [Acid Blockers Support]      Other reaction(s): joint pain     Current Outpatient Medications  Medication Sig Dispense Refill   amLODipine (NORVASC) 10 MG tablet Take 1 tablet (10 mg total) by mouth daily. 180 tablet 3   aspirin EC 81 MG tablet Take 81 mg by mouth daily.     Azelastine HCl 137 MCG/SPRAY SOLN Place into both nostrils.     B-D ULTRAFINE III SHORT PEN 31G X 8 MM MISC USE 1 PEN NEEDLE ONCE DAILY AS DIRECTED 100 each 2   calcitRIOL (ROCALTROL) 0.25 MCG capsule TAKE 1 CAPSULE BY MOUTH 3 TIMES A WEEK 36 capsule 0   carvedilol (COREG) 12.5 MG tablet Take 1 tablet (12.5 mg total) by mouth 2 (two) times daily. 180 tablet 3   Cyanocobalamin (VITAMIN B-12 PO) Take 1 tablet by mouth daily.     ezetimibe (ZETIA) 10 MG tablet daily.  2   FEROSUL 325 (65 Fe) MG tablet Take 325 mg by mouth 2 (two) times daily. (Patient not taking: Reported on 09/23/2021)     furosemide (LASIX) 80 MG tablet Take 40 mg by mouth 2 (two) times daily.     hydrALAZINE (APRESOLINE) 100 MG tablet Take 1 tablet by mouth twice daily 180 tablet 1   insulin degludec (TRESIBA FLEXTOUCH) 100 UNIT/ML FlexTouch Pen Inject 30 Units into the skin at bedtime. 15  mL 1   isosorbide mononitrate (IMDUR) 30 MG 24 hr tablet Take 1 tablet (30 mg total) by mouth daily. 90 tablet 1   sodium bicarbonate 650 MG tablet See admin instructions.     No current facility-administered medications for this visit.     Past Surgical History:  Procedure Laterality Date   COLONOSCOPY N/A 08/12/2015   Procedure: COLONOSCOPY;  Surgeon: Daneil Dolin, MD;  [facility];  Service: Endoscopy;  Laterality: N/A;  1245pm   ESOPHAGOGASTRODUODENOSCOPY N/A 08/12/2015   Procedure: ESOPHAGOGASTRODUODENOSCOPY (EGD);  Surgeon: Daneil Dolin, MD;  Location: [facility];  Service: Endoscopy;  Laterality: N/A;   none       Allergies  Allergen Reactions   Penicillins Other (See Comments)    Makes patient sore.  Has patient had a PCN reaction causing immediate rash,  facial/tongue/throat swelling, SOB or lightheadedness with hypotension: No Has patient had a PCN reaction causing severe rash involving mucus membranes or skin necrosis: No Has patient had a PCN reaction that required hospitalization No Has patient had a PCN reaction occurring within the last 10 years: No If all of the above answers are "NO", then may proceed with Cephalosporin use.    Statins Other (See Comments)    Makes wrists and fingers ache Other reaction(s): Muscle Pain   Statins Support [Acid Blockers Support]     Other reaction(s): joint pain      Family History  Problem Relation Age of Onset   Heart attack Mother    Diabetes Father    Hypertension Brother    Hypertension Brother    Diabetes Brother    Colon cancer Neg Hx      Social History Ms. Kats reports that she has never smoked. She has never used smokeless tobacco. Ms. Smolinski reports no history of alcohol use.   Review of Systems CONSTITUTIONAL: No weight loss, fever, chills, weakness or fatigue.  HEENT: Eyes: No visual loss, blurred vision, double vision or yellow sclerae.No hearing loss, sneezing, congestion, runny nose or sore throat.  SKIN: No rash or itching.  CARDIOVASCULAR: per hpi RESPIRATORY: No shortness of breath, cough or sputum.  GASTROINTESTINAL: No anorexia, nausea, vomiting or diarrhea. No abdominal pain or blood.  GENITOURINARY: No burning on urination, no polyuria NEUROLOGICAL: No headache, dizziness, syncope, paralysis, ataxia, numbness or tingling in the extremities. No change in bowel or bladder control.  MUSCULOSKELETAL: No muscle, back pain, joint pain or stiffness.  LYMPHATICS: No enlarged nodes. No history of splenectomy.  PSYCHIATRIC: No history of depression or anxiety.  ENDOCRINOLOGIC: No reports of sweating, cold or heat intolerance. No polyuria or polydipsia.  Marland Kitchen   Physical Examination Today's Vitals   04/06/22 1515  BP: (!) 140/78  Pulse: 67  SpO2: 98%  Weight: 146 lb  3.2 oz (66.3 kg)  Height: 5\' 1"  (1.549 m)   Body mass index is 27.62 kg/m.  Gen: resting comfortably, no acute distress HEENT: no scleral icterus, pupils equal round and reactive, no palptable cervical adenopathy,  CV: RRR, no m/r/ gno jvd Resp: Clear to auscultation bilaterally GI: abdomen is soft, non-tender, non-distended, normal bowel sounds, no hepatosplenomegaly MSK: extremities are warm, no edema.  Skin: warm, no rash Neuro:  no focal deficits Psych: appropriate affect     Assessment and Plan   1.Chronic diastolic HF - prior systolic dysfunction that has resovled, ongonig mild diastolic dysfunction - euvoelmic without symptoms, continue lasix at 80mg  bid.    2. HTN - elevated here but  at goal last several neprhology visits, limited med options given renal dysfunction - continue current meds   3. Hyperlipidemia - intolerant to statins, ran out of zetia few months ago. We will restart and follow lipids.      Arnoldo Lenis, M.D.

## 2022-04-06 NOTE — Patient Instructions (Signed)
Medication Instructions:  Your physician recommends that you continue on your current medications as directed. Please refer to the Current Medication list given to you today.   Labwork: None  Testing/Procedures: None  Follow-Up: Follow up with Dr. Branch in 6 months.   Any Other Special Instructions Will Be Listed Below (If Applicable).     If you need a refill on your cardiac medications before your next appointment, please call your pharmacy.  

## 2022-04-13 ENCOUNTER — Encounter (HOSPITAL_COMMUNITY)
Admission: RE | Admit: 2022-04-13 | Discharge: 2022-04-13 | Disposition: A | Discharge status: Home / Self Care | Payer: Medicare HMO | Source: Ambulatory Visit | Attending: Nephrology | Admitting: Nephrology

## 2022-04-13 ENCOUNTER — Encounter (HOSPITAL_COMMUNITY): Payer: Self-pay

## 2022-04-13 VITALS — BP 148/86 | HR 64 | Temp 97.9°F | Resp 18 | Ht 61.0 in | Wt 146.2 lb

## 2022-04-13 DIAGNOSIS — N184 Chronic kidney disease, stage 4 (severe): Secondary | ICD-10-CM | POA: Diagnosis not present

## 2022-04-13 LAB — POCT HEMOGLOBIN-HEMACUE: Hemoglobin: 9.2 g/dL — ABNORMAL LOW (ref 12.0–15.0)

## 2022-04-13 MED ORDER — EPOETIN ALFA-EPBX 4000 UNIT/ML IJ SOLN
4000.0000 [IU] | Freq: Once | INTRAMUSCULAR | Status: AC
  Administered 2022-04-13: 4000 [IU] via SUBCUTANEOUS
  Filled 2022-04-13: qty 1

## 2022-04-13 MED ORDER — EPOETIN ALFA 10000 UNIT/ML IJ SOLN
INTRAMUSCULAR | Status: AC
  Filled 2022-04-13: qty 1

## 2022-04-18 ENCOUNTER — Ambulatory Visit (HOSPITAL_COMMUNITY): Payer: Medicare HMO

## 2022-04-27 ENCOUNTER — Encounter (HOSPITAL_COMMUNITY)
Admission: RE | Admit: 2022-04-27 | Discharge: 2022-04-27 | Disposition: A | Discharge status: Home / Self Care | Payer: Medicare HMO | Source: Ambulatory Visit | Attending: Nephrology | Admitting: Nephrology

## 2022-04-27 VITALS — BP 123/62 | HR 64 | Temp 97.7°F | Resp 16 | Ht 61.0 in | Wt 146.2 lb

## 2022-04-27 DIAGNOSIS — D631 Anemia in chronic kidney disease: Secondary | ICD-10-CM | POA: Insufficient documentation

## 2022-04-27 DIAGNOSIS — N185 Chronic kidney disease, stage 5: Secondary | ICD-10-CM | POA: Diagnosis present

## 2022-04-27 LAB — POCT HEMOGLOBIN-HEMACUE: Hemoglobin: 9 g/dL — ABNORMAL LOW (ref 12.0–15.0)

## 2022-04-27 MED ORDER — EPOETIN ALFA-EPBX 2000 UNIT/ML IJ SOLN
INTRAMUSCULAR | Status: AC
  Administered 2022-04-27: 4000 [IU]
  Filled 2022-04-27: qty 2

## 2022-04-27 MED ORDER — EPOETIN ALFA-EPBX 3000 UNIT/ML IJ SOLN: 3000.0000 [IU] | Freq: Once | INTRAMUSCULAR | Status: DC

## 2022-05-09 ENCOUNTER — Ambulatory Visit: Payer: Medicare HMO | Admitting: "Endocrinology

## 2022-05-18 ENCOUNTER — Ambulatory Visit (INDEPENDENT_AMBULATORY_CARE_PROVIDER_SITE_OTHER): Payer: Medicare HMO | Admitting: "Endocrinology

## 2022-05-18 ENCOUNTER — Encounter (HOSPITAL_COMMUNITY)
Admission: RE | Admit: 2022-05-18 | Discharge: 2022-05-18 | Disposition: A | Discharge status: Home / Self Care | Payer: Medicare HMO | Source: Ambulatory Visit | Attending: Nephrology | Admitting: Nephrology

## 2022-05-18 ENCOUNTER — Encounter: Payer: Self-pay | Admitting: "Endocrinology

## 2022-05-18 VITALS — BP 180/68 | HR 58 | Temp 97.9°F | Resp 16

## 2022-05-18 VITALS — BP 140/78 | HR 68 | Ht 61.0 in | Wt 144.0 lb

## 2022-05-18 DIAGNOSIS — Z794 Long term (current) use of insulin: Secondary | ICD-10-CM | POA: Diagnosis not present

## 2022-05-18 DIAGNOSIS — Z91199 Patient's noncompliance with other medical treatment and regimen due to unspecified reason: Secondary | ICD-10-CM

## 2022-05-18 DIAGNOSIS — E119 Type 2 diabetes mellitus without complications: Secondary | ICD-10-CM | POA: Diagnosis not present

## 2022-05-18 DIAGNOSIS — N185 Chronic kidney disease, stage 5: Secondary | ICD-10-CM | POA: Insufficient documentation

## 2022-05-18 DIAGNOSIS — E782 Mixed hyperlipidemia: Secondary | ICD-10-CM

## 2022-05-18 DIAGNOSIS — I10 Essential (primary) hypertension: Secondary | ICD-10-CM | POA: Diagnosis not present

## 2022-05-18 DIAGNOSIS — Z789 Other specified health status: Secondary | ICD-10-CM | POA: Diagnosis not present

## 2022-05-18 DIAGNOSIS — D631 Anemia in chronic kidney disease: Secondary | ICD-10-CM | POA: Diagnosis not present

## 2022-05-18 LAB — CBC WITH DIFFERENTIAL/PLATELET
Abs Immature Granulocytes: 0.02 10*3/uL (ref 0.00–0.07)
Basophils Absolute: 0 10*3/uL (ref 0.0–0.1)
Basophils Relative: 1 %
Eosinophils Absolute: 0.2 10*3/uL (ref 0.0–0.5)
Eosinophils Relative: 2 %
HCT: 29.7 % — ABNORMAL LOW (ref 36.0–46.0)
Hemoglobin: 9.9 g/dL — ABNORMAL LOW (ref 12.0–15.0)
Immature Granulocytes: 0 %
Lymphocytes Relative: 15 %
Lymphs Abs: 1.3 10*3/uL (ref 0.7–4.0)
MCH: 29.2 pg (ref 26.0–34.0)
MCHC: 33.3 g/dL (ref 30.0–36.0)
MCV: 87.6 fL (ref 80.0–100.0)
Monocytes Absolute: 0.5 10*3/uL (ref 0.1–1.0)
Monocytes Relative: 5 %
Neutro Abs: 6.6 10*3/uL (ref 1.7–7.7)
Neutrophils Relative %: 77 %
Platelets: 290 10*3/uL (ref 150–400)
RBC: 3.39 MIL/uL — ABNORMAL LOW (ref 3.87–5.11)
RDW: 13.8 % (ref 11.5–15.5)
WBC: 8.6 10*3/uL (ref 4.0–10.5)
nRBC: 0 % (ref 0.0–0.2)

## 2022-05-18 LAB — POCT GLYCOSYLATED HEMOGLOBIN (HGB A1C): Hemoglobin A1C: 9.5 % — AB (ref 4.0–5.6)

## 2022-05-18 LAB — RENAL FUNCTION PANEL
Albumin: 3.8 g/dL (ref 3.5–5.0)
Anion gap: 13 (ref 5–15)
BUN: 74 mg/dL — ABNORMAL HIGH (ref 8–23)
CO2: 23 mmol/L (ref 22–32)
Calcium: 9.6 mg/dL (ref 8.9–10.3)
Chloride: 98 mmol/L (ref 98–111)
Creatinine, Ser: 3.67 mg/dL — ABNORMAL HIGH (ref 0.44–1.00)
GFR, Estimated: 12 mL/min — ABNORMAL LOW (ref >60)
Glucose, Bld: 357 mg/dL — ABNORMAL HIGH (ref 70–99)
Phosphorus: 4 mg/dL (ref 2.5–4.6)
Potassium: 3.9 mmol/L (ref 3.5–5.1)
Sodium: 134 mmol/L — ABNORMAL LOW (ref 135–145)

## 2022-05-18 LAB — PROTEIN / CREATININE RATIO, URINE
Creatinine, Urine: 63.01 mg/dL
Protein Creatinine Ratio: 0.9 mg/mg{Cre} — ABNORMAL HIGH (ref 0.00–0.15)
Total Protein, Urine: 57 mg/dL

## 2022-05-18 LAB — POCT HEMOGLOBIN-HEMACUE: Hemoglobin: 10.4 g/dL — ABNORMAL LOW (ref 12.0–15.0)

## 2022-05-18 MED ORDER — EPOETIN ALFA-EPBX 10000 UNIT/ML IJ SOLN: 4000.0000 [IU] | Freq: Once | INTRAMUSCULAR | Status: DC

## 2022-05-18 MED ORDER — TRESIBA FLEXTOUCH 100 UNIT/ML ~~LOC~~ SOPN: 25.0000 [IU] | PEN_INJECTOR | Freq: Every day | SUBCUTANEOUS | 1 refills | Status: DC

## 2022-05-18 NOTE — Progress Notes (Signed)
05/18/2022  Endocrinology follow-up note  Subjective:    Patient ID: Maria Parrish, female    DOB: Nov 30, 1945, PCP Maria Schwalbe, MD   Past Medical History:  Diagnosis Date   Assistance needed for mobility    walks with walker   Confusion    Dementia ([place])    Diabetes mellitus without complication ([place])    Generalized weakness    Hypercalcemia    Hyperlipidemia    Hyperparathyroidism ([place])    Hypertension    Hypertension    Moderate protein-calorie malnutrition ([place])    Peripheral neuropathy    UTI (lower urinary tract infection)    Past Surgical History:  Procedure Laterality Date   COLONOSCOPY N/A 08/12/2015   Procedure: COLONOSCOPY;  Surgeon: Daneil Dolin, MD;  [facility];  Service: Endoscopy;  Laterality: N/A;  1245pm   ESOPHAGOGASTRODUODENOSCOPY N/A 08/12/2015   Procedure: ESOPHAGOGASTRODUODENOSCOPY (EGD);  Surgeon: Daneil Dolin, MD;  [facility];  Service: Endoscopy;  Laterality: N/A;   none     Social History   Socioeconomic History   Marital status: Widowed    Spouse name: Not on file   Number of children: 2   Years of education: Not on file   Highest education level: Not on file  Occupational History   Occupation: fabricator for [employer]  Tobacco Use   Smoking status: Never   Smokeless tobacco: Never  Vaping Use   Vaping Use: Never used  Substance and Sexual Activity   Alcohol use: No   Drug use: No   Sexual activity: Not Currently  Other Topics Concern   Not on file  Social History Narrative   Not on file   Social Determinants of Health   Financial Resource Strain: Not on file  Food Insecurity: Not on file  Transportation Needs: No Transportation Needs (08/20/2020)   PRAPARE - Transportation    Lack of Transportation (Medical): No    Lack of Transportation (Non-Medical): No  Physical Activity: Inactive (08/20/2020)   Exercise Vital Sign    Days of Exercise per Week: 0 days    Minutes of Exercise per Session: 0 min   Stress: Not on file  Social Connections: Not on file   Outpatient Encounter Medications as of 05/18/2022  Medication Sig   amLODipine (NORVASC) 10 MG tablet Take 1 tablet (10 mg total) by mouth daily.   aspirin EC 81 MG tablet Take 81 mg by mouth daily.   Azelastine HCl 137 MCG/SPRAY SOLN Place into both nostrils.   B-D ULTRAFINE III SHORT PEN 31G X 8 MM MISC USE 1 PEN NEEDLE ONCE DAILY AS DIRECTED   calcitRIOL (ROCALTROL) 0.25 MCG capsule TAKE 1 CAPSULE BY MOUTH 3 TIMES A WEEK   carvedilol (COREG) 12.5 MG tablet Take 1 tablet (12.5 mg total) by mouth 2 (two) times daily.   Cyanocobalamin (VITAMIN B-12 PO) Take 1 tablet by mouth daily.   ezetimibe (ZETIA) 10 MG tablet Take 1 tablet (10 mg total) by mouth daily.   ferrous sulfate 325 (65 FE) MG tablet Take 325 mg by mouth daily.   furosemide (LASIX) 80 MG tablet Take 80 mg by mouth 2 (two) times daily.   hydrALAZINE (APRESOLINE) 100 MG tablet Take 1 tablet by mouth twice daily   insulin degludec (TRESIBA FLEXTOUCH) 100 UNIT/ML FlexTouch Pen Inject 25 Units into the skin at bedtime.   isosorbide mononitrate (IMDUR) 30 MG 24 hr tablet Take 1 tablet (30 mg total) by mouth daily.   sodium bicarbonate  650 MG tablet Take 650 mg by mouth daily.   [DISCONTINUED] insulin degludec (TRESIBA FLEXTOUCH) 100 UNIT/ML FlexTouch Pen Inject 30 Units into the skin at bedtime. (Patient taking differently: Inject 25 Units into the skin at bedtime.)   No facility-administered encounter medications on file as of 05/18/2022.   ALLERGIES: Allergies  Allergen Reactions   Penicillins Other (See Comments)    Makes patient sore.  Has patient had a PCN reaction causing immediate rash, facial/tongue/throat swelling, SOB or lightheadedness with hypotension: No Has patient had a PCN reaction causing severe rash involving mucus membranes or skin necrosis: No Has patient had a PCN reaction that required hospitalization No Has patient had a PCN reaction occurring within  the last 10 years: No If all of the above answers are "NO", then may proceed with Cephalosporin use.    Statins Other (See Comments)    Makes wrists and fingers ache Other reaction(s): Muscle Pain   Statins Support [Acid Blockers Support]     Other reaction(s): joint pain   VACCINATION STATUS:  There is no immunization history on file for this patient.  Diabetes   -76 year old female patient with medical history as above.   She was originally seen for secondary hyperparathyroidism with elevated PTH as a result of CKD.  She did not require active intervention, more recent labs show acceptable PTH range between 110-130, with calcium remaining normal at 9.4-10 mg per DL.  She is following with nephrology, for advancing CKD, she is only on observation management for this.     -She is being seen in follow-up for management of her currently and chronically  uncontrolled, type 2 diabetes.    She continues to struggle with control of her diabetes care. After reluctance for several months, she accepted low-dose basal insulin.  During her last visit, she was advised to take [place] 30 units nightly, claims to have been using [place] 25 units nightly . She is not monitoring blood glucose optimally, monitor only 3 times in the last 30 days.  Her point-of-care A1c is 9.5%, increasing from 8.4%.  She denies any hypoglycemia.    Due to worsening CKD, she was taken off of Metformin during her recent hospitalization.  She has no new complaints today.  She feels better. - Her thyroid ultrasound shows mild goiter with no discrete nodules. - She has no new complaints.  Review of Systems Limited as above.   Objective:    BP (!) 140/78   Pulse 68   Ht 5\' 1"  (1.549 m)   Wt 144 lb (65.3 kg)   BMI 27.21 kg/m   Wt Readings from Last 3 Encounters:  05/18/22 144 lb (65.3 kg)  04/27/22 146 lb 3.2 oz (66.3 kg)  04/13/22 146 lb 3.2 oz (66.3 kg)        Latest Ref Rng & Units 05/18/2022    2:52 PM  03/02/2022    3:16 PM 01/05/2022    2:45 PM  CMP  Glucose 70 - 99 mg/dL 357  292  347   BUN 8 - 23 mg/dL 74  94  68   Creatinine 0.44 - 1.00 mg/dL 3.67  4.48  3.99   Sodium 135 - 145 mmol/L 134  131  132   Potassium 3.5 - 5.1 mmol/L 3.9  4.4  4.2   Chloride 98 - 111 mmol/L 98  100  102   CO2 22 - 32 mmol/L 23  22  22    Calcium 8.9 - 10.3 mg/dL 9.6  9.5  9.4      Diabetic Labs (most recent): Lab Results  Component Value Date   HGBA1C 9.5 (A) 05/18/2022   HGBA1C 8.4 (A) 01/03/2022   HGBA1C 8.8 (A) 09/28/2021   Lipid Panel     Component Value Date/Time   CHOL 212 (H) 12/29/2021 1303   TRIG 150 (H) 12/29/2021 1303   HDL 40 12/29/2021 1303   CHOLHDL 5.3 (H) 12/29/2021 1303   CHOLHDL 4.8 09/23/2015 0537   VLDL 19 09/23/2015 0537   LDLCALC 145 (H) 12/29/2021 1303   LABVLDL 27 12/29/2021 1303      Assessment & Plan:   1. Uncontrolled type 2 diabetes mellitus without complication, without long-term current use of insulin ([place]) She did not engage optimally.  She does not monitor blood glucose for safe adjustment of insulin. She worries about hypoglycemia.  I encouraged her to continue [place] 25 units nightly, associated with monitoring of blood glucose at least twice a day-before breakfast and at bedtime.  She is encouraged to call clinic for blood glucose less than 70 or  greater than  200 mg/dL.  She is not a candidate for metformin.  -She does not have a safe oral regimen to manage diabetes.   - she acknowledges that there is a room for improvement in her food and drink choices. - Suggestion is made for her to avoid simple carbohydrates  from her diet including Cakes, Sweet Desserts, Ice Cream, Soda (diet and regular), Sweet Tea, Candies, Chips, Cookies, Store Bought Juices, Alcohol in Excess of  1-2 drinks a day, Artificial Sweeteners,  Coffee Creamer, and "Sugar-free" Products, Lemonade. This will help patient to have more stable blood glucose profile and potentially avoid  unintended weight gain.  2. Hypercalcemia: Resolved  - Her repeat labs show normal calcium of 9.0,  PTH remaining stable between 110-127.  this combination is likely a result of CKD. - She is vitamin D replete now . -She will not need any intervention for at this time. - Her neck/thyroid ultrasound was unremarkable. -She will be considered for 24-hour urine calcium and creatinine measurement on subsequent visits. -She is encouraged to stay close to her nephrologist.   3. Essential hypertension  -Her blood pressure is not controlled to target.    She reports that she has not taken her medication this morning.  She has lisinopril 30 mg p.o. daily at breakfast, metoprolol 25 mg p.o. daily, isosorbide 30 mg p.o. daily, hydralazine 100 mg p.o. twice daily, amlodipine 5 mg p.o. daily, and Lasix as needed.  She will not need more prescriptions.  She is advised to be consistent with her available medications.  She was given refills on her  isosorbide based on her request.   - She is hesitant to add any additional therapy, but willing to discuss this with her PMD.   4. Vitamin D deficiency: See #1 above. -She is status post therapy with vitamin D, currently at 28 ng/dL.  She is given a refill on her calcitriol 0.5 mg p.o. daily.   5. Goiter -Her thyroid ultrasound showed mild goiter with no discrete nodules. No intervention needed at this point.   6) peripheral arterial disease on screening ABI -Patient will benefit from more directed assessment, will be referred to vascular surgery.    - I advised patient to maintain close follow up with Maria Schwalbe, MD for primary care needs.    I spent 31 minutes in the care of the patient today including review of labs from Thyroid Function,  CMP, and other relevant labs ; imaging/biopsy records (current and previous including abstractions from other facilities); face-to-face time discussing  her lab results and symptoms, medications doses, her  options of short and long term treatment based on the latest standards of care / guidelines;   and documenting the encounter.  Maria Parrish  participated in the discussions, expressed understanding, and voiced agreement with the above plans.  All questions were answered to her satisfaction. she is encouraged to contact clinic should she have any questions or concerns prior to her return visit.    Follow up plan: Return in about 3 months (around 08/18/2022) for F/U with Pre-visit Labs, Meter/CGM/Logs, A1c here.  Glade Lloyd, MD Phone: [phone]  Fax: [phone]  -  This note was partially dictated with voice recognition software. Similar sounding words can be transcribed inadequately or may not  be corrected upon review.  05/18/2022, 7:51 PM

## 2022-05-19 LAB — PTH, INTACT AND CALCIUM
Calcium, Total (PTH): 9.7 mg/dL (ref 8.7–10.3)
PTH: 226 pg/mL — ABNORMAL HIGH (ref 15–65)

## 2022-05-28 ENCOUNTER — Other Ambulatory Visit: Payer: Self-pay | Admitting: "Endocrinology

## 2022-06-01 ENCOUNTER — Encounter (HOSPITAL_COMMUNITY)
Admission: RE | Admit: 2022-06-01 | Discharge: 2022-06-01 | Disposition: A | Discharge status: Home / Self Care | Payer: Medicare HMO | Source: Ambulatory Visit | Attending: Emergency Medicine | Admitting: Emergency Medicine

## 2022-06-01 VITALS — BP 110/59 | Temp 98.0°F | Resp 16 | Ht 61.0 in | Wt 140.0 lb

## 2022-06-01 DIAGNOSIS — N185 Chronic kidney disease, stage 5: Secondary | ICD-10-CM

## 2022-06-01 LAB — POCT HEMOGLOBIN-HEMACUE: Hemoglobin: 9 g/dL — ABNORMAL LOW (ref 12.0–15.0)

## 2022-06-01 MED ORDER — EPOETIN ALFA-EPBX 10000 UNIT/ML IJ SOLN
INTRAMUSCULAR | Status: AC
  Filled 2022-06-01: qty 1

## 2022-06-01 MED ORDER — EPOETIN ALFA-EPBX 10000 UNIT/ML IJ SOLN
4000.0000 [IU] | Freq: Once | INTRAMUSCULAR | Status: AC
  Administered 2022-06-01: 4000 [IU] via SUBCUTANEOUS

## 2022-06-15 ENCOUNTER — Encounter (HOSPITAL_COMMUNITY)
Admission: RE | Admit: 2022-06-15 | Discharge: 2022-06-15 | Disposition: A | Discharge status: Home / Self Care | Payer: Medicare HMO | Source: Ambulatory Visit | Attending: Nephrology | Admitting: Nephrology

## 2022-06-15 DIAGNOSIS — N185 Chronic kidney disease, stage 5: Secondary | ICD-10-CM | POA: Diagnosis not present

## 2022-06-15 DIAGNOSIS — D631 Anemia in chronic kidney disease: Secondary | ICD-10-CM | POA: Insufficient documentation

## 2022-06-15 LAB — POCT HEMOGLOBIN-HEMACUE: Hemoglobin: 9.1 g/dL — ABNORMAL LOW (ref 12.0–15.0)

## 2022-06-15 MED ORDER — EPOETIN ALFA-EPBX 4000 UNIT/ML IJ SOLN
4000.0000 [IU] | Freq: Once | INTRAMUSCULAR | Status: AC
  Administered 2022-06-15: 4000 [IU] via SUBCUTANEOUS
  Filled 2022-06-15: qty 1

## 2022-06-15 NOTE — Progress Notes (Signed)
Diagnosis: Anemia in Chronic Kidney Disease  Provider:  Manpreet Bhutani MD  Procedure: Injection  Retacrit (epoetin alfa-epbx), Dose: 4000 Units, Site: subcutaneous, Number of injections: 1  Post Care: Patient declined observation  Discharge: Condition: Good, Destination: Home . AVS provided to patient.   Performed by:  Jonelle Sidle, RN

## 2022-06-29 ENCOUNTER — Encounter (HOSPITAL_COMMUNITY)
Admission: RE | Admit: 2022-06-29 | Discharge: 2022-06-29 | Disposition: A | Discharge status: Home / Self Care | Payer: Medicare HMO | Source: Ambulatory Visit | Attending: Nephrology | Admitting: Nephrology

## 2022-06-29 VITALS — BP 149/75 | HR 65 | Temp 98.3°F | Resp 16

## 2022-06-29 DIAGNOSIS — I10 Essential (primary) hypertension: Secondary | ICD-10-CM | POA: Diagnosis not present

## 2022-06-29 DIAGNOSIS — Z789 Other specified health status: Secondary | ICD-10-CM | POA: Insufficient documentation

## 2022-06-29 DIAGNOSIS — D631 Anemia in chronic kidney disease: Secondary | ICD-10-CM | POA: Insufficient documentation

## 2022-06-29 DIAGNOSIS — Z794 Long term (current) use of insulin: Secondary | ICD-10-CM | POA: Diagnosis not present

## 2022-06-29 DIAGNOSIS — N185 Chronic kidney disease, stage 5: Secondary | ICD-10-CM | POA: Diagnosis present

## 2022-06-29 DIAGNOSIS — E119 Type 2 diabetes mellitus without complications: Secondary | ICD-10-CM | POA: Diagnosis not present

## 2022-06-29 MED ORDER — EPOETIN ALFA-EPBX 4000 UNIT/ML IJ SOLN
4000.0000 [IU] | Freq: Once | INTRAMUSCULAR | Status: AC
  Administered 2022-06-29: 4000 [IU] via SUBCUTANEOUS
  Filled 2022-06-29: qty 1

## 2022-06-29 NOTE — Progress Notes (Signed)
Diagnosis: Anemia in Chronic Kidney Disease  Provider:  Manpreet Bhutani MD  Procedure: Injection  Retacrit (epoetin alfa-epbx), Dose: 4000 Units, Site: subcutaneous, Number of injections: 1  HGB 9.0   Post Care: Patient declined observation  Discharge: Condition: Good, Destination: Home . AVS provided to patient.   Performed by:  Baxter Hire, RN

## 2022-07-03 LAB — POCT HEMOGLOBIN-HEMACUE: Hemoglobin: 9 g/dL — ABNORMAL LOW (ref 12.0–15.0)

## 2022-07-13 ENCOUNTER — Encounter (HOSPITAL_COMMUNITY)
Admission: RE | Admit: 2022-07-13 | Discharge: 2022-07-13 | Disposition: A | Discharge status: Home / Self Care | Payer: Medicare HMO | Source: Ambulatory Visit | Attending: Nephrology | Admitting: Nephrology

## 2022-07-13 VITALS — BP 182/85 | HR 64 | Temp 97.9°F | Resp 20

## 2022-07-13 DIAGNOSIS — N185 Chronic kidney disease, stage 5: Secondary | ICD-10-CM | POA: Diagnosis not present

## 2022-07-13 DIAGNOSIS — E119 Type 2 diabetes mellitus without complications: Secondary | ICD-10-CM

## 2022-07-13 DIAGNOSIS — Z789 Other specified health status: Secondary | ICD-10-CM

## 2022-07-13 DIAGNOSIS — D631 Anemia in chronic kidney disease: Secondary | ICD-10-CM

## 2022-07-13 DIAGNOSIS — I10 Essential (primary) hypertension: Secondary | ICD-10-CM

## 2022-07-13 LAB — COMPREHENSIVE METABOLIC PANEL
ALT: 10 U/L (ref 0–44)
AST: 12 U/L — ABNORMAL LOW (ref 15–41)
Albumin: 3.5 g/dL (ref 3.5–5.0)
Alkaline Phosphatase: 72 U/L (ref 38–126)
Anion gap: 7 (ref 5–15)
BUN: 57 mg/dL — ABNORMAL HIGH (ref 8–23)
CO2: 23 mmol/L (ref 22–32)
Calcium: 9 mg/dL (ref 8.9–10.3)
Chloride: 101 mmol/L (ref 98–111)
Creatinine, Ser: 3.23 mg/dL — ABNORMAL HIGH (ref 0.44–1.00)
GFR, Estimated: 14 mL/min — ABNORMAL LOW (ref >60)
Glucose, Bld: 273 mg/dL — ABNORMAL HIGH (ref 70–99)
Potassium: 3.4 mmol/L — ABNORMAL LOW (ref 3.5–5.1)
Sodium: 131 mmol/L — ABNORMAL LOW (ref 135–145)
Total Bilirubin: 0.6 mg/dL (ref 0.3–1.2)
Total Protein: 7.9 g/dL (ref 6.5–8.1)

## 2022-07-13 LAB — CBC WITH DIFFERENTIAL/PLATELET
Abs Immature Granulocytes: 0.02 10*3/uL (ref 0.00–0.07)
Basophils Absolute: 0 10*3/uL (ref 0.0–0.1)
Basophils Relative: 0 %
Eosinophils Absolute: 0.2 10*3/uL (ref 0.0–0.5)
Eosinophils Relative: 3 %
HCT: 28.7 % — ABNORMAL LOW (ref 36.0–46.0)
Hemoglobin: 9.6 g/dL — ABNORMAL LOW (ref 12.0–15.0)
Immature Granulocytes: 0 %
Lymphocytes Relative: 22 %
Lymphs Abs: 1.7 10*3/uL (ref 0.7–4.0)
MCH: 29.1 pg (ref 26.0–34.0)
MCHC: 33.4 g/dL (ref 30.0–36.0)
MCV: 87 fL (ref 80.0–100.0)
Monocytes Absolute: 0.4 10*3/uL (ref 0.1–1.0)
Monocytes Relative: 5 %
Neutro Abs: 5.5 10*3/uL (ref 1.7–7.7)
Neutrophils Relative %: 70 %
Platelets: 328 10*3/uL (ref 150–400)
RBC: 3.3 MIL/uL — ABNORMAL LOW (ref 3.87–5.11)
RDW: 13.4 % (ref 11.5–15.5)
WBC: 8 10*3/uL (ref 4.0–10.5)
nRBC: 0 % (ref 0.0–0.2)

## 2022-07-13 LAB — TSH: TSH: 2.449 u[IU]/mL (ref 0.350–4.500)

## 2022-07-13 LAB — LIPID PANEL
Cholesterol: 243 mg/dL — ABNORMAL HIGH (ref 0–200)
HDL: 38 mg/dL — ABNORMAL LOW (ref >40)
LDL Cholesterol: 168 mg/dL — ABNORMAL HIGH (ref 0–99)
Total CHOL/HDL Ratio: 6.4 RATIO
Triglycerides: 185 mg/dL — ABNORMAL HIGH (ref <150)
VLDL: 37 mg/dL (ref 0–40)

## 2022-07-13 LAB — T4, FREE: Free T4: 0.75 ng/dL (ref 0.61–1.12)

## 2022-07-13 LAB — POCT HEMOGLOBIN-HEMACUE: Hemoglobin: 10 g/dL — ABNORMAL LOW (ref 12.0–15.0)

## 2022-07-13 MED ORDER — EPOETIN ALFA-EPBX 4000 UNIT/ML IJ SOLN: 4000.0000 [IU] | Freq: Once | INTRAMUSCULAR | Status: DC

## 2022-07-13 NOTE — Progress Notes (Signed)
Hgb 10.0 no injection needed - labs obtained for Dr. Theador Hawthorne and Dr. Dorris Fetch

## 2022-07-13 NOTE — Addendum Note (Signed)
Encounter addended by: Ramond Craver, [facility] on: 07/13/2022 4:45 PM  Actions taken: Order list changed

## 2022-07-15 LAB — HEMOGLOBIN A1C
Hgb A1c MFr Bld: 8.9 % — ABNORMAL HIGH (ref 4.8–5.6)
Mean Plasma Glucose: 209 mg/dL

## 2022-07-27 ENCOUNTER — Encounter (HOSPITAL_COMMUNITY)
Admission: RE | Admit: 2022-07-27 | Discharge: 2022-07-27 | Disposition: A | Discharge status: Home / Self Care | Payer: Medicare HMO | Source: Ambulatory Visit | Attending: Nephrology | Admitting: Nephrology

## 2022-07-27 VITALS — BP 213/82 | HR 63 | Temp 98.4°F | Resp 16

## 2022-07-27 DIAGNOSIS — N185 Chronic kidney disease, stage 5: Secondary | ICD-10-CM | POA: Diagnosis present

## 2022-07-27 DIAGNOSIS — D631 Anemia in chronic kidney disease: Secondary | ICD-10-CM | POA: Insufficient documentation

## 2022-07-27 LAB — PROTEIN / CREATININE RATIO, URINE
Creatinine, Urine: 67.44 mg/dL
Protein Creatinine Ratio: 2.28 mg/mg{Cre} — ABNORMAL HIGH (ref 0.00–0.15)
Total Protein, Urine: 154 mg/dL

## 2022-07-27 LAB — POCT HEMOGLOBIN-HEMACUE: Hemoglobin: 10.1 g/dL — ABNORMAL LOW (ref 12.0–15.0)

## 2022-07-27 MED ORDER — EPOETIN ALFA-EPBX 4000 UNIT/ML IJ SOLN: 4000.0000 [IU] | Freq: Once | INTRAMUSCULAR | Status: DC

## 2022-07-27 NOTE — Progress Notes (Signed)
Hgb 10.1, shot not given.  Patient states that she forgot to take her BP medication this morning. She also states that her BP was high when she saw Dr. Theador Hawthorne earlier today and that he has started her on another BP medication. She was advised to continue to monitor her BP and to let Dr. Theador Hawthorne know of any increase or any symptoms. She verbalized understanding and agreement.

## 2022-08-10 ENCOUNTER — Encounter (HOSPITAL_COMMUNITY)
Admission: RE | Admit: 2022-08-10 | Discharge: 2022-08-10 | Disposition: A | Discharge status: Home / Self Care | Payer: Medicare HMO | Source: Ambulatory Visit | Attending: Nephrology | Admitting: Nephrology

## 2022-08-10 VITALS — BP 167/72 | HR 72 | Temp 98.2°F | Resp 16

## 2022-08-10 DIAGNOSIS — N185 Chronic kidney disease, stage 5: Secondary | ICD-10-CM | POA: Diagnosis not present

## 2022-08-10 LAB — CBC WITH DIFFERENTIAL/PLATELET
Abs Immature Granulocytes: 0.02 10*3/uL (ref 0.00–0.07)
Basophils Absolute: 0 10*3/uL (ref 0.0–0.1)
Basophils Relative: 1 %
Eosinophils Absolute: 0.2 10*3/uL (ref 0.0–0.5)
Eosinophils Relative: 3 %
HCT: 28 % — ABNORMAL LOW (ref 36.0–46.0)
Hemoglobin: 9.4 g/dL — ABNORMAL LOW (ref 12.0–15.0)
Immature Granulocytes: 0 %
Lymphocytes Relative: 18 %
Lymphs Abs: 1.6 10*3/uL (ref 0.7–4.0)
MCH: 29.1 pg (ref 26.0–34.0)
MCHC: 33.6 g/dL (ref 30.0–36.0)
MCV: 86.7 fL (ref 80.0–100.0)
Monocytes Absolute: 0.4 10*3/uL (ref 0.1–1.0)
Monocytes Relative: 5 %
Neutro Abs: 6.2 10*3/uL (ref 1.7–7.7)
Neutrophils Relative %: 73 %
Platelets: 315 10*3/uL (ref 150–400)
RBC: 3.23 MIL/uL — ABNORMAL LOW (ref 3.87–5.11)
RDW: 13.5 % (ref 11.5–15.5)
WBC: 8.5 10*3/uL (ref 4.0–10.5)
nRBC: 0 % (ref 0.0–0.2)

## 2022-08-10 LAB — RENAL FUNCTION PANEL
Albumin: 3.4 g/dL — ABNORMAL LOW (ref 3.5–5.0)
Anion gap: 10 (ref 5–15)
BUN: 49 mg/dL — ABNORMAL HIGH (ref 8–23)
CO2: 21 mmol/L — ABNORMAL LOW (ref 22–32)
Calcium: 8.9 mg/dL (ref 8.9–10.3)
Chloride: 101 mmol/L (ref 98–111)
Creatinine, Ser: 3.6 mg/dL — ABNORMAL HIGH (ref 0.44–1.00)
GFR, Estimated: 13 mL/min — ABNORMAL LOW (ref >60)
Glucose, Bld: 260 mg/dL — ABNORMAL HIGH (ref 70–99)
Phosphorus: 3.1 mg/dL (ref 2.5–4.6)
Potassium: 3.1 mmol/L — ABNORMAL LOW (ref 3.5–5.1)
Sodium: 132 mmol/L — ABNORMAL LOW (ref 135–145)

## 2022-08-10 LAB — POCT HEMOGLOBIN-HEMACUE: Hemoglobin: 9.5 g/dL — ABNORMAL LOW (ref 12.0–15.0)

## 2022-08-10 MED ORDER — EPOETIN ALFA-EPBX 4000 UNIT/ML IJ SOLN
4000.0000 [IU] | Freq: Once | INTRAMUSCULAR | Status: AC
  Administered 2022-08-10: 4000 [IU] via SUBCUTANEOUS

## 2022-08-10 NOTE — Progress Notes (Signed)
Diagnosis: Anemia in Chronic Kidney Disease  Provider:  Manpreet Bhutani MD  Procedure: Injection  Retacrit (epoetin alfa-epbx), Dose: 4000 Units, Site: subcutaneous, Number of injections: 1  Hgb 9.5  Post Care: Patient declined observation  Discharge: Condition: Good, Destination: Home . AVS provided to patient.   Performed by:  Baxter Hire, RN

## 2022-08-23 ENCOUNTER — Ambulatory Visit (INDEPENDENT_AMBULATORY_CARE_PROVIDER_SITE_OTHER): Payer: Medicare HMO | Admitting: "Endocrinology

## 2022-08-23 ENCOUNTER — Encounter: Payer: Self-pay | Admitting: "Endocrinology

## 2022-08-23 VITALS — BP 132/86 | HR 52 | Ht 61.0 in | Wt 148.8 lb

## 2022-08-23 DIAGNOSIS — E119 Type 2 diabetes mellitus without complications: Secondary | ICD-10-CM

## 2022-08-23 DIAGNOSIS — Z789 Other specified health status: Secondary | ICD-10-CM | POA: Diagnosis not present

## 2022-08-23 DIAGNOSIS — I10 Essential (primary) hypertension: Secondary | ICD-10-CM

## 2022-08-23 DIAGNOSIS — Z794 Long term (current) use of insulin: Secondary | ICD-10-CM

## 2022-08-23 DIAGNOSIS — E782 Mixed hyperlipidemia: Secondary | ICD-10-CM

## 2022-08-23 MED ORDER — TRESIBA FLEXTOUCH 100 UNIT/ML ~~LOC~~ SOPN: 30.0000 [IU] | PEN_INJECTOR | Freq: Every day | SUBCUTANEOUS | 1 refills | Status: DC

## 2022-08-23 NOTE — Patient Instructions (Signed)

## 2022-08-23 NOTE — Progress Notes (Unsigned)
08/23/2022  Endocrinology follow-up note  Subjective:    Patient ID: Maria Parrish, female    DOB: 19-Jun-1946, PCP Vidal Schwalbe, MD   Past Medical History:  Diagnosis Date   Assistance needed for mobility    walks with walker   Confusion    Dementia ([place])    Diabetes mellitus without complication ([place])    Generalized weakness    Hypercalcemia    Hyperlipidemia    Hyperparathyroidism ([place])    Hypertension    Hypertension    Moderate protein-calorie malnutrition ([place])    Peripheral neuropathy    UTI (lower urinary tract infection)    Past Surgical History:  Procedure Laterality Date   COLONOSCOPY N/A 08/12/2015   Procedure: COLONOSCOPY;  Surgeon: Daneil Dolin, MD;  [facility];  Service: Endoscopy;  Laterality: N/A;  1245pm   ESOPHAGOGASTRODUODENOSCOPY N/A 08/12/2015   Procedure: ESOPHAGOGASTRODUODENOSCOPY (EGD);  Surgeon: Daneil Dolin, MD;  [facility];  Service: Endoscopy;  Laterality: N/A;   none     Social History   Socioeconomic History   Marital status: Widowed    Spouse name: Not on file   Number of children: 2   Years of education: Not on file   Highest education level: Not on file  Occupational History   Occupation: fabricator for [employer]  Tobacco Use   Smoking status: Never   Smokeless tobacco: Never  Vaping Use   Vaping Use: Never used  Substance and Sexual Activity   Alcohol use: No   Drug use: No   Sexual activity: Not Currently  Other Topics Concern   Not on file  Social History Narrative   Not on file   Social Determinants of Health   Financial Resource Strain: Not on file  Food Insecurity: Not on file  Transportation Needs: No Transportation Needs (08/20/2020)   PRAPARE - Transportation    Lack of Transportation (Medical): No    Lack of Transportation (Non-Medical): No  Physical Activity: Inactive (08/20/2020)   Exercise Vital Sign    Days of Exercise per Week: 0 days    Minutes of Exercise per Session: 0 min   Stress: Not on file  Social Connections: Not on file   Outpatient Encounter Medications as of 08/23/2022  Medication Sig   amLODipine (NORVASC) 10 MG tablet Take 1 tablet (10 mg total) by mouth daily.   aspirin EC 81 MG tablet Take 81 mg by mouth daily.   Azelastine HCl 137 MCG/SPRAY SOLN Place into both nostrils.   B-D ULTRAFINE III SHORT PEN 31G X 8 MM MISC USE 1 PEN NEEDLE ONCE DAILY AS DIRECTED   calcitRIOL (ROCALTROL) 0.25 MCG capsule TAKE 1 CAPSULE BY MOUTH THREE TIMES A WEEK   carvedilol (COREG) 12.5 MG tablet Take 1 tablet (12.5 mg total) by mouth 2 (two) times daily.   Cyanocobalamin (VITAMIN B-12 PO) Take 1 tablet by mouth daily.   ezetimibe (ZETIA) 10 MG tablet Take 1 tablet (10 mg total) by mouth daily.   ferrous sulfate 325 (65 FE) MG tablet Take 325 mg by mouth daily.   furosemide (LASIX) 80 MG tablet Take 80 mg by mouth 2 (two) times daily.   hydrALAZINE (APRESOLINE) 100 MG tablet Take 1 tablet by mouth twice daily   insulin degludec (TRESIBA FLEXTOUCH) 100 UNIT/ML FlexTouch Pen Inject 30 Units into the skin at bedtime.   isosorbide mononitrate (IMDUR) 30 MG 24 hr tablet Take 1 tablet (30 mg total) by mouth daily.   sodium bicarbonate  650 MG tablet Take 650 mg by mouth daily.   [DISCONTINUED] insulin degludec (TRESIBA FLEXTOUCH) 100 UNIT/ML FlexTouch Pen Inject 25 Units into the skin at bedtime.   No facility-administered encounter medications on file as of 08/23/2022.   ALLERGIES: Allergies  Allergen Reactions   Penicillins Other (See Comments)    Makes patient sore.  Has patient had a PCN reaction causing immediate rash, facial/tongue/throat swelling, SOB or lightheadedness with hypotension: No Has patient had a PCN reaction causing severe rash involving mucus membranes or skin necrosis: No Has patient had a PCN reaction that required hospitalization No Has patient had a PCN reaction occurring within the last 10 years: No If all of the above answers are "NO", then may  proceed with Cephalosporin use.    Statins Other (See Comments)    Makes wrists and fingers ache Other reaction(s): Muscle Pain   Statins Support [Acid Blockers Support]     Other reaction(s): joint pain   VACCINATION STATUS:  There is no immunization history on file for this patient.  Diabetes   -77 year old female patient with medical history as above.   She was originally seen for secondary hyperparathyroidism with elevated PTH as a result of CKD.  She did not require active intervention, more recent labs show acceptable PTH range between 110-130, with calcium remaining normal at 9.4-10 mg per DL.  She is following with nephrology, for advancing CKD, she is only on observation management for this.     -She is being seen in follow-up for management of her currently and chronically  uncontrolled, type 2 diabetes.    She continues to struggle with control of her diabetes care. After reluctance for several months, she accepted low-dose basal insulin.  During her last visit, she was advised to take [place] 30 units nightly, claims to have been using [place] 25 units nightly . She is not monitoring blood glucose optimally, monitors 1 time a day, averaging 176-182 for the last 30 days.  Her point-of-care A1c is 8.9%, improving from 9.5% during her last visit.    She denies any hypoglycemia.    Due to worsening CKD, she was taken off of Metformin during her recent hospitalization.  She has no new complaints today.  She feels better. - Her thyroid ultrasound shows mild goiter with no discrete nodules. - She has no new complaints.  Review of Systems Limited as above.   Objective:    BP 132/86   Pulse (!) 52   Ht 5\' 1"  (1.549 m)   Wt 148 lb 12.8 oz (67.5 kg)   BMI 28.12 kg/m   Wt Readings from Last 3 Encounters:  08/23/22 148 lb 12.8 oz (67.5 kg)  06/01/22 139 lb 15.9 oz (63.5 kg)  05/18/22 144 lb (65.3 kg)        Latest Ref Rng & Units 08/10/2022    3:26 PM 07/13/2022     4:02 PM 05/18/2022    2:52 PM  CMP  Glucose 70 - 99 mg/dL 260  273  357   BUN 8 - 23 mg/dL 49  57  74   Creatinine 0.44 - 1.00 mg/dL 3.60  3.23  3.67   Sodium 135 - 145 mmol/L 132  131  134   Potassium 3.5 - 5.1 mmol/L 3.1  3.4  3.9   Chloride 98 - 111 mmol/L 101  101  98   CO2 22 - 32 mmol/L 21  23  23    Calcium 8.9 - 10.3 mg/dL 8.9  9.0  9.6    9.7   Total Protein 6.5 - 8.1 g/dL  7.9    Total Bilirubin 0.3 - 1.2 mg/dL  0.6    Alkaline Phos 38 - 126 U/L  72    AST 15 - 41 U/L  12    ALT 0 - 44 U/L  10       Diabetic Labs (most recent): Lab Results  Component Value Date   HGBA1C 8.9 (H) 07/13/2022   HGBA1C 9.5 (A) 05/18/2022   HGBA1C 8.4 (A) 01/03/2022   Lipid Panel     Component Value Date/Time   CHOL 243 (H) 07/13/2022 1602   CHOL 212 (H) 12/29/2021 1303   TRIG 185 (H) 07/13/2022 1602   HDL 38 (L) 07/13/2022 1602   HDL 40 12/29/2021 1303   CHOLHDL 6.4 07/13/2022 1602   VLDL 37 07/13/2022 1602   LDLCALC 168 (H) 07/13/2022 1602   LDLCALC 145 (H) 12/29/2021 1303   LABVLDL 27 12/29/2021 1303      Assessment & Plan:   1. Uncontrolled type 2 diabetes mellitus without complication, without long-term current use of insulin ([place]) She did not engage optimally.  She does not monitor blood glucose for safe adjustment of insulin. She worries about hypoglycemia.  In light of her presentation with still high A1c of 8.9%, she will need a higher dose of insulin.  I advised her to increase her [place] to 30 units nightly, associated with  monitoring of blood glucose at least twice a day-before breakfast and at bedtime.  She is encouraged to call clinic for blood glucose less than 70 or  greater than  200 mg/dL.  She is not a candidate for metformin.  -She does not have a safe oral regimen to manage diabetes.   - she acknowledges that there is a room for improvement in her food and drink choices. - Suggestion is made for her to avoid simple carbohydrates  from her diet including  Cakes, Sweet Desserts, Ice Cream, Soda (diet and regular), Sweet Tea, Candies, Chips, Cookies, Store Bought Juices, Alcohol in Excess of  1-2 drinks a day, Artificial Sweeteners,  Coffee Creamer, and "Sugar-free" Products, Lemonade. This will help patient to have more stable blood glucose profile and potentially avoid unintended weight gain.  2. Hypercalcemia: Resolved  - Her repeat labs show normal calcium of 9.0,  PTH remaining stable between 110-127.  this combination is likely a result of CKD. - She is vitamin D replete now . -She will not need any intervention for at this time. - Her neck/thyroid ultrasound was unremarkable. -She will be considered for 24-hour urine calcium and creatinine measurement on subsequent visits. -She is encouraged to stay close to her nephrologist.   3. Essential hypertension  -Her blood pressure is controlled to target.  She is advised to continue lisinopril 30 mg p.o. daily at breakfast,  metoprolol 25 mg p.o. daily, isosorbide 30 mg p.o. daily, hydralazine 100 mg p.o. twice daily, amlodipine 5 mg p.o. daily, and Lasix as needed.  She will not need more prescriptions.  She is advised to be consistent with her available medications.  She was given refills on her  isosorbide based on her request.   - She is hesitant to add any additional therapy, but willing to discuss this with her PMD.   4. Vitamin D deficiency: See #1 above. -She is status post therapy with vitamin D, currently at 28 ng/dL.  She is given a refill on her calcitriol 0.5 mg p.o. daily.  5. Goiter -Her thyroid ultrasound showed mild goiter with no discrete nodules. No intervention needed at this point.   6) peripheral arterial disease on screening ABI -Patient will benefit from more directed assessment, will be referred to vascular surgery.   - I advised patient to maintain close follow up with Vidal Schwalbe, MD for primary care needs.  I spent  26  minutes in the care of the patient  today including review of labs from Thyroid Function, CMP, and other relevant labs ; imaging/biopsy records (current and previous including abstractions from other facilities); face-to-face time discussing  her lab results and symptoms, medications doses, her options of short and long term treatment based on the latest standards of care / guidelines;   and documenting the encounter.  Baily M Nakagawa  participated in the discussions, expressed understanding, and voiced agreement with the above plans.  All questions were answered to her satisfaction. she is encouraged to contact clinic should she have any questions or concerns prior to her return visit.    Follow up plan: Return in about 3 months (around 11/21/2022) for Bring Meter/CGM Device/Logs- A1c in Office.  Glade Lloyd, MD Phone: [phone]  Fax: [phone]  -  This note was partially dictated with voice recognition software. Similar sounding words can be transcribed inadequately or may not  be corrected upon review.  08/23/2022, 7:21 PM

## 2022-08-24 ENCOUNTER — Encounter (HOSPITAL_COMMUNITY): Admission: RE | Admit: 2022-08-24 | Payer: Medicare HMO | Source: Ambulatory Visit

## 2022-09-07 ENCOUNTER — Encounter (HOSPITAL_COMMUNITY)
Admission: RE | Admit: 2022-09-07 | Discharge: 2022-09-07 | Disposition: A | Discharge status: Home / Self Care | Payer: Medicare HMO | Source: Ambulatory Visit | Attending: Nephrology | Admitting: Nephrology

## 2022-09-07 VITALS — BP 162/70 | HR 60 | Temp 98.4°F | Resp 20

## 2022-09-07 DIAGNOSIS — D631 Anemia in chronic kidney disease: Secondary | ICD-10-CM | POA: Diagnosis present

## 2022-09-07 DIAGNOSIS — N185 Chronic kidney disease, stage 5: Secondary | ICD-10-CM | POA: Diagnosis not present

## 2022-09-07 LAB — POCT HEMOGLOBIN-HEMACUE: Hemoglobin: 9.9 g/dL — ABNORMAL LOW (ref 12.0–15.0)

## 2022-09-07 MED ORDER — EPOETIN ALFA-EPBX 4000 UNIT/ML IJ SOLN
4000.0000 [IU] | Freq: Once | INTRAMUSCULAR | Status: AC
  Administered 2022-09-07: 4000 [IU] via SUBCUTANEOUS

## 2022-09-07 NOTE — Progress Notes (Addendum)
Diagnosis: Anemia in Chronic Kidney Disease  Provider:  Manpreet Bhutani MD  Procedure: Injection  Retacrit (epoetin alfa-epbx), Dose: 4000 Units, Site: subcutaneous, Number of injections: 1  Post Care: Observation period completed  Discharge: Condition: Good, Destination: Home . AVS Declined  Performed by:  Fraser Din [place], RN     Hgb  9.9

## 2022-09-07 NOTE — Addendum Note (Signed)
Encounter addended by: Baxter Hire, RN on: 09/07/2022 4:05 PM  Actions taken: Charge Capture section accepted

## 2022-09-20 ENCOUNTER — Other Ambulatory Visit: Payer: Self-pay | Admitting: "Endocrinology

## 2022-09-21 ENCOUNTER — Encounter (HOSPITAL_COMMUNITY)
Admission: RE | Admit: 2022-09-21 | Discharge: 2022-09-21 | Disposition: A | Discharge status: Home / Self Care | Payer: Medicare HMO | Source: Ambulatory Visit | Attending: Nephrology | Admitting: Nephrology

## 2022-09-21 VITALS — BP 132/64 | HR 66 | Temp 97.6°F | Resp 20

## 2022-09-21 DIAGNOSIS — N185 Chronic kidney disease, stage 5: Secondary | ICD-10-CM | POA: Insufficient documentation

## 2022-09-21 DIAGNOSIS — D631 Anemia in chronic kidney disease: Secondary | ICD-10-CM

## 2022-09-21 LAB — POCT HEMOGLOBIN-HEMACUE: Hemoglobin: 10.2 g/dL — ABNORMAL LOW (ref 12.0–15.0)

## 2022-09-21 MED ORDER — EPOETIN ALFA-EPBX 4000 UNIT/ML IJ SOLN: 4000.0000 [IU] | Freq: Once | INTRAMUSCULAR | Status: DC

## 2022-09-21 NOTE — Progress Notes (Signed)
Diagnosis: Anemia in Chronic Kidney Disease  Provider:  Manpreet Bhutani MD  Procedure: Injection  Retacrit (epoetin alfa-epbx), Dose: 4000 Units, Site: subcutaneous, Number of injections: NOT GIVEN  Hgb 10.2  Post Care: Patient declined observation  Discharge: Condition: Good, Destination: Home . AVS Provided  Performed by:  Baxter Hire, RN

## 2022-10-05 ENCOUNTER — Encounter (HOSPITAL_COMMUNITY)
Admission: RE | Admit: 2022-10-05 | Discharge: 2022-10-05 | Disposition: A | Discharge status: Home / Self Care | Payer: Medicare HMO | Source: Ambulatory Visit | Attending: Nephrology | Admitting: Nephrology

## 2022-10-05 VITALS — BP 97/74 | HR 64 | Temp 98.1°F | Resp 16

## 2022-10-05 DIAGNOSIS — D631 Anemia in chronic kidney disease: Secondary | ICD-10-CM

## 2022-10-05 DIAGNOSIS — N185 Chronic kidney disease, stage 5: Secondary | ICD-10-CM | POA: Diagnosis not present

## 2022-10-05 LAB — POCT HEMOGLOBIN-HEMACUE: Hemoglobin: 9.8 g/dL — ABNORMAL LOW (ref 12.0–15.0)

## 2022-10-05 MED ORDER — EPOETIN ALFA-EPBX 4000 UNIT/ML IJ SOLN
4000.0000 [IU] | Freq: Once | INTRAMUSCULAR | Status: AC
  Administered 2022-10-05: 4000 [IU] via SUBCUTANEOUS
  Filled 2022-10-05: qty 1

## 2022-10-05 NOTE — Progress Notes (Signed)
Diagnosis: Anemia in Chronic Kidney Disease  Provider:  Manpreet Bhutani MD  Procedure: Injection  Retacrit (epoetin alfa-epbx), Dose: 4000 Units, Site: subcutaneous, Number of injections: 1  Hgb 9.8  Post Care: Patient declined observation  Discharge: Condition: Good, Destination: Home . AVS Provided  Performed by:  Baxter Hire, RN

## 2022-10-10 ENCOUNTER — Ambulatory Visit: Payer: Medicare HMO | Attending: Cardiology | Admitting: Cardiology

## 2022-10-10 ENCOUNTER — Encounter: Payer: Self-pay | Admitting: Cardiology

## 2022-10-10 VITALS — BP 110/52 | HR 70 | Ht 64.0 in | Wt 141.0 lb

## 2022-10-10 DIAGNOSIS — I5032 Chronic diastolic (congestive) heart failure: Secondary | ICD-10-CM

## 2022-10-10 DIAGNOSIS — E782 Mixed hyperlipidemia: Secondary | ICD-10-CM | POA: Diagnosis not present

## 2022-10-10 DIAGNOSIS — I10 Essential (primary) hypertension: Secondary | ICD-10-CM

## 2022-10-10 NOTE — Patient Instructions (Signed)
Medication Instructions:  Your physician recommends that you continue on your current medications as directed. Please refer to the Current Medication list given to you today.  *If you need a refill on your cardiac medications before your next appointment, please call your pharmacy*   Lab Work: Fasting Lipid Panel- Nothing to eat/drink 6 hours prior to lab. If you have labs (blood work) drawn today and your tests are completely normal, you will receive your results only by: [place] (if you have MyChart) OR A paper copy in the mail If you have any lab test that is abnormal or we need to change your treatment, we will call you to review the results.   Testing/Procedures: None   Follow-Up: At [facility], you and your health needs are our priority.  As part of our continuing mission to provide you with exceptional heart care, we have created designated Provider Care Teams.  These Care Teams include your primary Cardiologist (physician) and Advanced Practice Providers (APPs -  Physician Assistants and Nurse Practitioners) who all work together to provide you with the care you need, when you need it.  We recommend signing up for the patient portal called "MyChart".  Sign up information is provided on this After Visit Summary.  MyChart is used to connect with patients for Virtual Visits (Telemedicine).  Patients are able to view lab/test results, encounter notes, upcoming appointments, etc.  Non-urgent messages can be sent to your provider as well.   To learn more about what you can do with MyChart, go to [email or web address].    Your next appointment:   6 month(s)  Provider:   Carlyle Dolly, MD    Other Instructions

## 2022-10-10 NOTE — Progress Notes (Signed)
Clinical Summary [name] is a 77 y.o.female seen today for follow up of the following medical problems.    1.Chronic combined systolic/diastolic HF [id number] echo LVEF 55-60%, grade I dd -05/2020 echo shows her EF is now reduced at 40-45% with Grade 2 DD and global HK 06/2021 echo LVEF 55-60%, grade I dd  - medical therapy has been limited by renal dysfunction     - no SOB/DOE. Denies any edema - home weights low to mid 140s - compliant with meds   2. CKD IV - followed by Dr Theador Hawthorne   3. HTN - she is compliant with meds       4. Hyperlipidemia - labs followed by pcp - she is on zetia 10mg  daily. Reports has not been getting from pharmacy.  -intolerant to statins, reports tried multiple statins.  12/2021 TC 212 TG 150 LDL 145 (was off zetia at this time)  06/2022 TC 243 TG 185 HDL 38 LDL 168 Past Medical History:  Diagnosis Date   Assistance needed for mobility    walks with walker   Confusion    Dementia ([place])    Diabetes mellitus without complication ([facility])    Generalized weakness    Hypercalcemia    Hyperlipidemia    Hyperparathyroidism ([place])    Hypertension    Hypertension    Moderate protein-calorie malnutrition ([place])    Peripheral neuropathy    UTI (lower urinary tract infection)      Allergies  Allergen Reactions   Penicillins Other (See Comments)    Makes patient sore.  Has patient had a PCN reaction causing immediate rash, facial/tongue/throat swelling, SOB or lightheadedness with hypotension: No Has patient had a PCN reaction causing severe rash involving mucus membranes or skin necrosis: No Has patient had a PCN reaction that required hospitalization No Has patient had a PCN reaction occurring within the last 10 years: No If all of the above answers are "NO", then may proceed with Cephalosporin use.    Statins Other (See Comments)    Makes wrists and fingers ache Other reaction(s): Muscle Pain   Statins Support [Acid Blockers Support]      Other reaction(s): joint pain     Current Outpatient Medications  Medication Sig Dispense Refill   amLODipine (NORVASC) 10 MG tablet Take 1 tablet (10 mg total) by mouth daily. 180 tablet 3   aspirin EC 81 MG tablet Take 81 mg by mouth daily.     Azelastine HCl 137 MCG/SPRAY SOLN Place into both nostrils.     B-D ULTRAFINE III SHORT PEN 31G X 8 MM MISC USE 1 PEN NEEDLE ONCE DAILY AS DIRECTED 100 each 2   calcitRIOL (ROCALTROL) 0.25 MCG capsule TAKE 1 CAPSULE BY MOUTH THREE TIMES A WEEK 36 capsule 0   carvedilol (COREG) 12.5 MG tablet Take 1 tablet (12.5 mg total) by mouth 2 (two) times daily. 180 tablet 3   Cyanocobalamin (VITAMIN B-12 PO) Take 1 tablet by mouth daily.     ezetimibe (ZETIA) 10 MG tablet Take 1 tablet (10 mg total) by mouth daily. 90 tablet 3   ferrous sulfate 325 (65 FE) MG tablet Take 325 mg by mouth daily.     furosemide (LASIX) 80 MG tablet Take 80 mg by mouth 2 (two) times daily.     hydrALAZINE (APRESOLINE) 100 MG tablet Take 1 tablet by mouth twice daily 180 tablet 1   insulin degludec (TRESIBA FLEXTOUCH) 100 UNIT/ML FlexTouch Pen Inject 30 Units into the  skin at bedtime. 15 mL 1   isosorbide mononitrate (IMDUR) 30 MG 24 hr tablet Take 1 tablet (30 mg total) by mouth daily. 90 tablet 1   sodium bicarbonate 650 MG tablet Take 650 mg by mouth daily.     No current facility-administered medications for this visit.     Past Surgical History:  Procedure Laterality Date   COLONOSCOPY N/A 08/12/2015   Procedure: COLONOSCOPY;  Surgeon: Daneil Dolin, MD;  [facility];  Service: Endoscopy;  Laterality: N/A;  1245pm   ESOPHAGOGASTRODUODENOSCOPY N/A 08/12/2015   Procedure: ESOPHAGOGASTRODUODENOSCOPY (EGD);  Surgeon: Daneil Dolin, MD;  [facility];  Service: Endoscopy;  Laterality: N/A;   none       Allergies  Allergen Reactions   Penicillins Other (See Comments)    Makes patient sore.  Has patient had a PCN reaction causing immediate rash,  facial/tongue/throat swelling, SOB or lightheadedness with hypotension: No Has patient had a PCN reaction causing severe rash involving mucus membranes or skin necrosis: No Has patient had a PCN reaction that required hospitalization No Has patient had a PCN reaction occurring within the last 10 years: No If all of the above answers are "NO", then may proceed with Cephalosporin use.    Statins Other (See Comments)    Makes wrists and fingers ache Other reaction(s): Muscle Pain   Statins Support [Acid Blockers Support]     Other reaction(s): joint pain      Family History  Problem Relation Age of Onset   Heart attack Mother    Diabetes Father    Hypertension Brother    Hypertension Brother    Diabetes Brother    Colon cancer Neg Hx      Social History Ms. Rolph reports that she has never smoked. She has never used smokeless tobacco. Ms. Cody reports no history of alcohol use.   Review of Systems CONSTITUTIONAL: No weight loss, fever, chills, weakness or fatigue.  HEENT: Eyes: No visual loss, blurred vision, double vision or yellow sclerae.No hearing loss, sneezing, congestion, runny nose or sore throat.  SKIN: No rash or itching.  CARDIOVASCULAR: per hpi RESPIRATORY: No shortness of breath, cough or sputum.  GASTROINTESTINAL: No anorexia, nausea, vomiting or diarrhea. No abdominal pain or blood.  GENITOURINARY: No burning on urination, no polyuria NEUROLOGICAL: No headache, dizziness, syncope, paralysis, ataxia, numbness or tingling in the extremities. No change in bowel or bladder control.  MUSCULOSKELETAL: No muscle, back pain, joint pain or stiffness.  LYMPHATICS: No enlarged nodes. No history of splenectomy.  PSYCHIATRIC: No history of depression or anxiety.  ENDOCRINOLOGIC: No reports of sweating, cold or heat intolerance. No polyuria or polydipsia.  Marland Kitchen   Physical Examination Today's Vitals   10/10/22 0927  BP: (!) 110/52  Pulse: 70  SpO2: 98%  Weight: 141 lb  (64 kg)  Height: 5\' 4"  (1.626 m)   Body mass index is 24.2 kg/m.  Gen: resting comfortably, no acute distress HEENT: no scleral icterus, pupils equal round and reactive, no palptable cervical adenopathy,  CV: RRR, no m/rg, no jvd Resp: Clear to auscultation bilaterally GI: abdomen is soft, non-tender, non-distended, normal bowel sounds, no hepatosplenomegaly MSK: extremities are warm, no edema.  Skin: warm, no rash Neuro:  no focal deficits Psych: appropriate affect     Assessment and Plan  1.Chronic diastolic HF - prior systolic dysfunction that has resovled, ongonig mild diastolic dysfunction - no symptoms, she is euvolemic today - continue current meds   2.  HTN - at goal, continue current meds  3. Hyperlipidemia - intolerant to statins. Elevated LDL in Dec, mixed compliance with zetia at that time. Report staking consistently now, repeat lipid panel. May need lipid clinic referral   F/u 6 months      Arnoldo Lenis, M.D.

## 2022-10-19 ENCOUNTER — Encounter (HOSPITAL_COMMUNITY)
Admission: RE | Admit: 2022-10-19 | Discharge: 2022-10-19 | Disposition: A | Discharge status: Home / Self Care | Payer: Medicare HMO | Source: Ambulatory Visit | Attending: Nephrology | Admitting: Nephrology

## 2022-10-19 VITALS — BP 128/81 | HR 70 | Temp 98.2°F | Resp 17

## 2022-10-19 DIAGNOSIS — N185 Chronic kidney disease, stage 5: Secondary | ICD-10-CM | POA: Diagnosis not present

## 2022-10-19 DIAGNOSIS — N189 Chronic kidney disease, unspecified: Secondary | ICD-10-CM | POA: Insufficient documentation

## 2022-10-19 DIAGNOSIS — D631 Anemia in chronic kidney disease: Secondary | ICD-10-CM | POA: Insufficient documentation

## 2022-10-19 LAB — POCT HEMOGLOBIN-HEMACUE: Hemoglobin: 9.6 g/dL — ABNORMAL LOW (ref 12.0–15.0)

## 2022-10-19 LAB — POTASSIUM: Potassium: 5 mmol/L (ref 3.5–5.1)

## 2022-10-19 MED ORDER — EPOETIN ALFA-EPBX 4000 UNIT/ML IJ SOLN
4000.0000 [IU] | Freq: Once | INTRAMUSCULAR | Status: AC
  Administered 2022-10-19: 4000 [IU] via SUBCUTANEOUS
  Filled 2022-10-19: qty 1

## 2022-10-19 NOTE — Addendum Note (Signed)
Encounter addended by: Binnie Kand, RN on: 10/19/2022 4:19 PM  Actions taken: Therapy plan modified

## 2022-10-19 NOTE — Progress Notes (Signed)
Diagnosis: Anemia in Chronic Kidney Disease  Provider:  Manpreet Bhutani MD  Procedure: Injection  Retacrit (epoetin alfa-epbx), Dose: 4000 Units, Site: subcutaneous, Number of injections: 1  HGB 9.6  Post Care: Patient declined observation  Discharge: Condition: Good, Destination: Home . AVS Provided  Performed by:  Grayland Ormond, RN

## 2022-11-02 ENCOUNTER — Encounter (HOSPITAL_COMMUNITY)
Admission: RE | Admit: 2022-11-02 | Discharge: 2022-11-02 | Disposition: A | Discharge status: Home / Self Care | Payer: Medicare HMO | Source: Ambulatory Visit | Attending: Nephrology | Admitting: Nephrology

## 2022-11-02 VITALS — BP 144/63 | Temp 98.0°F | Resp 16

## 2022-11-02 DIAGNOSIS — D631 Anemia in chronic kidney disease: Secondary | ICD-10-CM

## 2022-11-02 DIAGNOSIS — N185 Chronic kidney disease, stage 5: Secondary | ICD-10-CM | POA: Diagnosis not present

## 2022-11-02 LAB — CBC WITH DIFFERENTIAL/PLATELET
Abs Immature Granulocytes: 0.01 10*3/uL (ref 0.00–0.07)
Basophils Absolute: 0 10*3/uL (ref 0.0–0.1)
Basophils Relative: 0 %
Eosinophils Absolute: 0.2 10*3/uL (ref 0.0–0.5)
Eosinophils Relative: 2 %
HCT: 29.9 % — ABNORMAL LOW (ref 36.0–46.0)
Hemoglobin: 10 g/dL — ABNORMAL LOW (ref 12.0–15.0)
Immature Granulocytes: 0 %
Lymphocytes Relative: 17 %
Lymphs Abs: 1.2 10*3/uL (ref 0.7–4.0)
MCH: 29.4 pg (ref 26.0–34.0)
MCHC: 33.4 g/dL (ref 30.0–36.0)
MCV: 87.9 fL (ref 80.0–100.0)
Monocytes Absolute: 0.3 10*3/uL (ref 0.1–1.0)
Monocytes Relative: 4 %
Neutro Abs: 5.5 10*3/uL (ref 1.7–7.7)
Neutrophils Relative %: 77 %
Platelets: 278 10*3/uL (ref 150–400)
RBC: 3.4 MIL/uL — ABNORMAL LOW (ref 3.87–5.11)
RDW: 13.5 % (ref 11.5–15.5)
WBC: 7.3 10*3/uL (ref 4.0–10.5)
nRBC: 0 % (ref 0.0–0.2)

## 2022-11-02 LAB — PROTEIN / CREATININE RATIO, URINE
Creatinine, Urine: 131 mg/dL
Protein Creatinine Ratio: 1.08 mg/mg{Cre} — ABNORMAL HIGH (ref 0.00–0.15)
Total Protein, Urine: 142 mg/dL

## 2022-11-02 LAB — POCT HEMOGLOBIN-HEMACUE: Hemoglobin: 10.2 g/dL — ABNORMAL LOW (ref 12.0–15.0)

## 2022-11-02 LAB — RENAL FUNCTION PANEL
Albumin: 3.7 g/dL (ref 3.5–5.0)
Anion gap: 11 (ref 5–15)
BUN: 93 mg/dL — ABNORMAL HIGH (ref 8–23)
CO2: 19 mmol/L — ABNORMAL LOW (ref 22–32)
Calcium: 9.5 mg/dL (ref 8.9–10.3)
Chloride: 98 mmol/L (ref 98–111)
Creatinine, Ser: 4.98 mg/dL — ABNORMAL HIGH (ref 0.44–1.00)
GFR, Estimated: 9 mL/min — ABNORMAL LOW (ref >60)
Glucose, Bld: 287 mg/dL — ABNORMAL HIGH (ref 70–99)
Phosphorus: 4.2 mg/dL (ref 2.5–4.6)
Potassium: 4.9 mmol/L (ref 3.5–5.1)
Sodium: 128 mmol/L — ABNORMAL LOW (ref 135–145)

## 2022-11-02 LAB — FERRITIN: Ferritin: 339 ng/mL — ABNORMAL HIGH (ref 11–307)

## 2022-11-02 LAB — IRON AND TIBC
Iron: 69 ug/dL (ref 28–170)
Saturation Ratios: 33 % — ABNORMAL HIGH (ref 10.4–31.8)
TIBC: 209 ug/dL — ABNORMAL LOW (ref 250–450)
UIBC: 140 ug/dL

## 2022-11-02 MED ORDER — EPOETIN ALFA-EPBX 4000 UNIT/ML IJ SOLN: 4000.0000 [IU] | Freq: Once | INTRAMUSCULAR | Status: DC

## 2022-11-02 NOTE — Progress Notes (Signed)
Diagnosis: Anemia in Chronic Kidney Disease  Provider:  Manpreet Bhutani MD  Procedure: Injection  Retacrit (epoetin alfa-epbx),   Medication not given, Hgb 10.2  Post Care: Observation period completed  Discharge: Condition: Good, Destination: Home . AVS Provided  Performed by:  Forrest Moron, RN

## 2022-11-04 LAB — PTH, INTACT AND CALCIUM
Calcium, Total (PTH): 9.7 mg/dL (ref 8.7–10.3)
PTH: 248 pg/mL — ABNORMAL HIGH (ref 15–65)

## 2022-11-16 ENCOUNTER — Encounter (HOSPITAL_COMMUNITY)
Admission: RE | Admit: 2022-11-16 | Discharge: 2022-11-16 | Disposition: A | Discharge status: Home / Self Care | Payer: Medicare HMO | Source: Ambulatory Visit | Attending: Nephrology | Admitting: Nephrology

## 2022-11-16 VITALS — BP 126/62 | HR 73 | Temp 98.0°F | Resp 16

## 2022-11-16 DIAGNOSIS — N185 Chronic kidney disease, stage 5: Secondary | ICD-10-CM

## 2022-11-16 DIAGNOSIS — D631 Anemia in chronic kidney disease: Secondary | ICD-10-CM | POA: Insufficient documentation

## 2022-11-16 LAB — POCT HEMOGLOBIN-HEMACUE: Hemoglobin: 10.4 g/dL — ABNORMAL LOW (ref 12.0–15.0)

## 2022-11-16 MED ORDER — EPOETIN ALFA-EPBX 4000 UNIT/ML IJ SOLN: 4000.0000 [IU] | Freq: Once | INTRAMUSCULAR | Status: DC

## 2022-11-16 NOTE — Progress Notes (Signed)
Diagnosis: Anemia in Chronic Kidney Disease  Provider:  Manpreet Bhutani MD  Procedure: Injection  Retacrit (epoetin alfa-epbx), 4000 units, Number of injections: 0  Medication not given, Hgb 10.4  Post Care: Observation period declined.  Discharge: Condition: Good, Destination: Home . AVS Provided  Performed by:  Wyvonne Lenz, RN

## 2022-11-21 ENCOUNTER — Encounter: Payer: Self-pay | Admitting: "Endocrinology

## 2022-11-21 ENCOUNTER — Ambulatory Visit (INDEPENDENT_AMBULATORY_CARE_PROVIDER_SITE_OTHER): Payer: Medicare HMO | Admitting: "Endocrinology

## 2022-11-21 ENCOUNTER — Ambulatory Visit: Payer: Medicare HMO | Admitting: "Endocrinology

## 2022-11-21 VITALS — BP 112/54 | HR 72 | Ht 61.0 in | Wt 145.0 lb

## 2022-11-21 DIAGNOSIS — Z794 Long term (current) use of insulin: Secondary | ICD-10-CM

## 2022-11-21 DIAGNOSIS — E782 Mixed hyperlipidemia: Secondary | ICD-10-CM

## 2022-11-21 DIAGNOSIS — I10 Essential (primary) hypertension: Secondary | ICD-10-CM | POA: Diagnosis not present

## 2022-11-21 DIAGNOSIS — N185 Chronic kidney disease, stage 5: Secondary | ICD-10-CM

## 2022-11-21 DIAGNOSIS — Z789 Other specified health status: Secondary | ICD-10-CM

## 2022-11-21 DIAGNOSIS — E1122 Type 2 diabetes mellitus with diabetic chronic kidney disease: Secondary | ICD-10-CM

## 2022-11-21 LAB — POCT GLYCOSYLATED HEMOGLOBIN (HGB A1C): HbA1c, POC (controlled diabetic range): 8.9 % — AB (ref 0.0–7.0)

## 2022-11-21 MED ORDER — TRESIBA FLEXTOUCH 100 UNIT/ML ~~LOC~~ SOPN: 40.0000 [IU] | PEN_INJECTOR | Freq: Every day | SUBCUTANEOUS | 2 refills | Status: DC

## 2022-11-21 NOTE — Patient Instructions (Signed)

## 2022-11-21 NOTE — Progress Notes (Signed)
11/21/2022  Endocrinology follow-up note  Subjective:    Patient ID: Maria Parrish, female    DOB: 12/16/1945, PCP Smith Robert, MD   Past Medical History:  Diagnosis Date   Assistance needed for mobility    walks with walker   Confusion    Dementia (HCC)    Diabetes mellitus without complication (HCC)    Generalized weakness    Hypercalcemia    Hyperlipidemia    Hyperparathyroidism (HCC)    Hypertension    Hypertension    Moderate protein-calorie malnutrition (HCC)    Peripheral neuropathy    UTI (lower urinary tract infection)    Past Surgical History:  Procedure Laterality Date   COLONOSCOPY N/A 08/12/2015   Procedure: COLONOSCOPY;  Surgeon: Corbin Ade, MD;  Location: [facility];  Service: Endoscopy;  Laterality: N/A;  1245pm   ESOPHAGOGASTRODUODENOSCOPY N/A 08/12/2015   Procedure: ESOPHAGOGASTRODUODENOSCOPY (EGD);  Surgeon: Corbin Ade, MD;  Location: [facility];  Service: Endoscopy;  Laterality: N/A;   none     Social History   Socioeconomic History   Marital status: Widowed    Spouse name: Not on file   Number of children: 2   Years of education: Not on file   Highest education level: Not on file  Occupational History   Occupation: fabricator for [employer]  Tobacco Use   Smoking status: Never   Smokeless tobacco: Never  Vaping Use   Vaping Use: Never used  Substance and Sexual Activity   Alcohol use: No   Drug use: No   Sexual activity: Not Currently  Other Topics Concern   Not on file  Social History Narrative   Not on file   Social Determinants of Health   Financial Resource Strain: Not on file  Food Insecurity: Not on file  Transportation Needs: No Transportation Needs (08/20/2020)   PRAPARE - Transportation    Lack of Transportation (Medical): No    Lack of Transportation (Non-Medical): No  Physical Activity: Inactive (08/20/2020)   Exercise Vital Sign    Days of Exercise per Week: 0 days    Minutes of Exercise per Session: 0 min   Stress: Not on file  Social Connections: Not on file   Outpatient Encounter Medications as of 11/21/2022  Medication Sig   amLODipine (NORVASC) 10 MG tablet Take 1 tablet (10 mg total) by mouth daily.   aspirin EC 81 MG tablet Take 81 mg by mouth daily.   Azelastine HCl 137 MCG/SPRAY SOLN Place into both nostrils.   B-D ULTRAFINE III SHORT PEN 31G X 8 MM MISC USE 1 PEN NEEDLE ONCE DAILY AS DIRECTED   calcitRIOL (ROCALTROL) 0.25 MCG capsule TAKE 1 CAPSULE BY MOUTH THREE TIMES A WEEK   carvedilol (COREG) 12.5 MG tablet Take 1 tablet (12.5 mg total) by mouth 2 (two) times daily.   Cyanocobalamin (VITAMIN B-12 PO) Take 1 tablet by mouth daily.   ezetimibe (ZETIA) 10 MG tablet Take 1 tablet (10 mg total) by mouth daily.   ferrous sulfate 325 (65 FE) MG tablet Take 325 mg by mouth daily.   furosemide (LASIX) 80 MG tablet Take 80 mg by mouth 2 (two) times daily.   hydrALAZINE (APRESOLINE) 100 MG tablet Take 1 tablet by mouth twice daily   insulin degludec (TRESIBA FLEXTOUCH) 100 UNIT/ML FlexTouch Pen Inject 40 Units into the skin at bedtime.   isosorbide mononitrate (IMDUR) 30 MG 24 hr tablet Take 1 tablet (30 mg total) by mouth daily.   Potassium Chloride  ER 20 MEQ TBCR 1 tablet 2 (two) times daily.   sodium bicarbonate 650 MG tablet Take 650 mg by mouth daily.   [DISCONTINUED] insulin degludec (TRESIBA FLEXTOUCH) 100 UNIT/ML FlexTouch Pen Inject 30 Units into the skin at bedtime. (Patient taking differently: Inject 30 Units into the skin daily.)   No facility-administered encounter medications on file as of 11/21/2022.   ALLERGIES: Allergies  Allergen Reactions   Penicillins Other (See Comments)    Makes patient sore.  Has patient had a PCN reaction causing immediate rash, facial/tongue/throat swelling, SOB or lightheadedness with hypotension: No Has patient had a PCN reaction causing severe rash involving mucus membranes or skin necrosis: No Has patient had a PCN reaction that required  hospitalization No Has patient had a PCN reaction occurring within the last 10 years: No If all of the above answers are "NO", then may proceed with Cephalosporin use.    Statins Other (See Comments)    Makes wrists and fingers ache Other reaction(s): Muscle Pain   Statins Support [Acid Blockers Support]     Other reaction(s): joint pain   VACCINATION STATUS:  There is no immunization history on file for this patient.  Diabetes   -77 year old female patient with medical history as above.   She was originally seen for secondary hyperparathyroidism with elevated PTH as a result of CKD.  She did not require active intervention, more recent labs show acceptable PTH range between 110-130, with calcium remaining normal at 9.4-10 mg per DL.  She is following with nephrology, for advancing CKD, she is only on observation management for this.     -She is being seen in follow-up for management of her currently and chronically  uncontrolled, type 2 diabetes.    She continues to struggle with control of her diabetes care. After reluctance for several months, she accepted low-dose basal insulin.  During her last visit, she was advised to take Tresiba 30 units nightly.  She presents with a meter showing 1 reading per day, slightly above target, averaging 146-200 mg per DL at fasting. She is not monitoring blood glucose optimally.   Her point-of-care A1c remains high at 8.9% today.   She denies any hypoglycemia.  She worries about hypoglycemia.    Due to worsening CKD following up with nephrology, her next appointment is in 3 days.  She reports that she was approached for dialysis, however declined it.    She has no new complaints today.  - Her thyroid ultrasound shows mild goiter with no discrete nodules. - She has no new complaints.  Review of Systems Limited as above.   Objective:    BP (!) 112/54   Pulse 72   Ht 5\' 1"  (1.549 m)   Wt 145 lb (65.8 kg)   BMI 27.40 kg/m   Wt Readings from  Last 3 Encounters:  11/21/22 145 lb (65.8 kg)  10/10/22 141 lb (64 kg)  08/23/22 148 lb 12.8 oz (67.5 kg)        Latest Ref Rng & Units 11/02/2022    3:29 PM 10/19/2022    3:37 PM 08/10/2022    3:26 PM  CMP  Glucose 70 - 99 mg/dL 914   782   BUN 8 - 23 mg/dL 93   49   Creatinine 9.56 - 1.00 mg/dL 2.13   0.86   Sodium 578 - 145 mmol/L 128   132   Potassium 3.5 - 5.1 mmol/L 4.9  5.0  3.1   Chloride 98 - 111  mmol/L 98   101   CO2 22 - 32 mmol/L 19   21   Calcium 8.9 - 10.3 mg/dL 8.7 - 16.1 mg/dL 9.5    9.7   8.9      Diabetic Labs (most recent): Lab Results  Component Value Date   HGBA1C 8.9 (A) 11/21/2022   HGBA1C 8.9 (H) 07/13/2022   HGBA1C 9.5 (A) 05/18/2022   Lipid Panel     Component Value Date/Time   CHOL 243 (H) 07/13/2022 1602   CHOL 212 (H) 12/29/2021 1303   TRIG 185 (H) 07/13/2022 1602   HDL 38 (L) 07/13/2022 1602   HDL 40 12/29/2021 1303   CHOLHDL 6.4 07/13/2022 1602   VLDL 37 07/13/2022 1602   LDLCALC 168 (H) 07/13/2022 1602   LDLCALC 145 (H) 12/29/2021 1303   LABVLDL 27 12/29/2021 1303      Assessment & Plan:   1. 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  She does not monitor blood glucose for safe adjustment of insulin. She worries about hypoglycemia.  In light of her presentation with still high A1c of 8.9%, she will need a higher dose of insulin.  I advised her to increase her Tresiba to 40 units nightly,  associated with  monitoring of blood glucose at least twice a day-before breakfast and at bedtime.  She is encouraged to call clinic for blood glucose less than 70 or  greater than  200 mg/dL.  She is not a candidate for metformin.  -She does not have a safe oral regimen to manage diabetes. Patient is known to be alarmingly noncompliant/nonadherent. Her renal function is worsening, GFR below 10, patient is a candidate for hemodialysis.  She thinks her kidneys are  "fine due to the fact that she does not  feel any symptoms".  Reportedly, she is declining this option.  I highly encouraged her to discuss it further with her nephrologist during her upcoming appointment in 3 days.   - she acknowledges that there is a room for improvement in her food and drink choices. - Suggestion is made for her to avoid simple carbohydrates  from her diet including Cakes, Sweet Desserts, Ice Cream, Soda (diet and regular), Sweet Tea, Candies, Chips, Cookies, Store Bought Juices, Alcohol in Excess of  1-2 drinks a day, Artificial Sweeteners,  Coffee Creamer, and "Sugar-free" Products, Lemonade. This will help patient to have more stable blood glucose profile and potentially avoid unintended weight gain.  2. Hypercalcemia: Resolved  - Her repeat labs show normal calcium of 9.5,  PTH remaining stable between 110-127.  this combination is likely a result of CKD. - She is vitamin D replete now . -She will not need any intervention for at this time. - Her neck/thyroid ultrasound was unremarkable. -She will be considered for 24-hour urine calcium and creatinine measurement on subsequent visits. -She is encouraged to stay close to her nephrologist.   3. Essential hypertension  -Her blood pressure is controlled to target.  She is advised to continue lisinopril 30 mg p.o. daily at breakfast,  metoprolol 25 mg p.o. daily, isosorbide 30 mg p.o. daily, hydralazine 100 mg p.o. twice daily, amlodipine 5 mg p.o. daily, and Lasix as needed.  She will not need more prescriptions.  She is advised to be consistent with her available medications.  She was given refills on her  isosorbide based on her request.   - She is hesitant to add any additional therapy, but willing  to discuss this with her PMD.   4. Vitamin D deficiency: See #1 above. -She is status post therapy with vitamin D, currently at 28 ng/dL.  She is given a refill on her calcitriol 0.5 mg p.o. daily.   5. Goiter -Her thyroid ultrasound showed mild goiter with no  discrete nodules. No intervention needed at this point.    - I advised patient to maintain close follow up with Smith Robert, MD for primary care needs.  I spent  25  minutes in the care of the patient today including review of labs from Thyroid Function, blood glucose readings, CMP, and other relevant labs ; imaging/biopsy records (current and previous including abstractions from other facilities); face-to-face time discussing  her lab results and symptoms, medications doses, her options of short and long term treatment based on the latest standards of care / guidelines;   and documenting the encounter.  Anida M Hanigan  participated in the discussions, expressed understanding, and voiced agreement with the above plans.  All questions were answered to her satisfaction. she is encouraged to contact clinic should she have any questions or concerns prior to her return visit.    Follow up plan: Return in about 3 months (around 02/21/2023) for Bring Meter/CGM Device/Logs- A1c in Office.  Marquis Lunch, MD Phone: [phone]  Fax: [phone]  -  This note was partially dictated with voice recognition software. Similar sounding words can be transcribed inadequately or may not  be corrected upon review.  11/21/2022, 6:41 PM

## 2022-11-30 ENCOUNTER — Encounter (HOSPITAL_COMMUNITY)
Admission: RE | Admit: 2022-11-30 | Discharge: 2022-11-30 | Disposition: A | Discharge status: Home / Self Care | Payer: Medicare HMO | Source: Ambulatory Visit | Attending: Nephrology | Admitting: Nephrology

## 2022-11-30 VITALS — BP 129/85 | HR 66 | Temp 97.8°F | Resp 16

## 2022-11-30 DIAGNOSIS — D631 Anemia in chronic kidney disease: Secondary | ICD-10-CM

## 2022-11-30 DIAGNOSIS — N185 Chronic kidney disease, stage 5: Secondary | ICD-10-CM | POA: Diagnosis not present

## 2022-11-30 MED ORDER — EPOETIN ALFA-EPBX 2000 UNIT/ML IJ SOLN
4000.0000 [IU] | Freq: Once | INTRAMUSCULAR | Status: AC
  Administered 2022-11-30: 4000 [IU] via SUBCUTANEOUS
  Filled 2022-11-30: qty 2

## 2022-11-30 NOTE — Progress Notes (Signed)
Diagnosis: Anemia in Chronic Kidney Disease  Provider:  Manpreet Bhutani MD  Procedure: Injection  Retacrit (epoetin alfa-epbx), 4000 units, Number of injections: 2  1 injection administered in each arm. Hgb 9.0  Post Care: Observation period declined.  Discharge: Condition: Good, Destination: Home . AVS Provided  Performed by:  Wyvonne Lenz, RN

## 2022-11-30 NOTE — Addendum Note (Signed)
Encounter addended by: Wyvonne Lenz, RN on: 11/30/2022 3:57 PM  Actions taken: Therapy plan modified

## 2022-12-01 LAB — POCT HEMOGLOBIN-HEMACUE: Hemoglobin: 9 g/dL — ABNORMAL LOW (ref 12.0–15.0)

## 2022-12-14 ENCOUNTER — Encounter (HOSPITAL_COMMUNITY)
Admission: RE | Admit: 2022-12-14 | Discharge: 2022-12-14 | Disposition: A | Discharge status: Home / Self Care | Payer: Medicare HMO | Source: Ambulatory Visit | Attending: Nephrology | Admitting: Nephrology

## 2022-12-14 VITALS — BP 142/74 | HR 63 | Temp 97.5°F | Resp 16

## 2022-12-14 DIAGNOSIS — N185 Chronic kidney disease, stage 5: Secondary | ICD-10-CM | POA: Diagnosis not present

## 2022-12-14 DIAGNOSIS — D631 Anemia in chronic kidney disease: Secondary | ICD-10-CM

## 2022-12-14 LAB — CBC WITH DIFFERENTIAL/PLATELET
Abs Immature Granulocytes: 0.02 10*3/uL (ref 0.00–0.07)
Basophils Absolute: 0 10*3/uL (ref 0.0–0.1)
Basophils Relative: 1 %
Eosinophils Absolute: 0.3 10*3/uL (ref 0.0–0.5)
Eosinophils Relative: 3 %
HCT: 29.9 % — ABNORMAL LOW (ref 36.0–46.0)
Hemoglobin: 10 g/dL — ABNORMAL LOW (ref 12.0–15.0)
Immature Granulocytes: 0 %
Lymphocytes Relative: 12 %
Lymphs Abs: 1 10*3/uL (ref 0.7–4.0)
MCH: 29.2 pg (ref 26.0–34.0)
MCHC: 33.4 g/dL (ref 30.0–36.0)
MCV: 87.4 fL (ref 80.0–100.0)
Monocytes Absolute: 0.3 10*3/uL (ref 0.1–1.0)
Monocytes Relative: 4 %
Neutro Abs: 6.9 10*3/uL (ref 1.7–7.7)
Neutrophils Relative %: 80 %
Platelets: 333 10*3/uL (ref 150–400)
RBC: 3.42 MIL/uL — ABNORMAL LOW (ref 3.87–5.11)
RDW: 13.9 % (ref 11.5–15.5)
WBC: 8.5 10*3/uL (ref 4.0–10.5)
nRBC: 0 % (ref 0.0–0.2)

## 2022-12-14 LAB — RENAL FUNCTION PANEL
Albumin: 3.7 g/dL (ref 3.5–5.0)
Anion gap: 10 (ref 5–15)
BUN: 80 mg/dL — ABNORMAL HIGH (ref 8–23)
CO2: 18 mmol/L — ABNORMAL LOW (ref 22–32)
Calcium: 9.8 mg/dL (ref 8.9–10.3)
Chloride: 103 mmol/L (ref 98–111)
Creatinine, Ser: 4.26 mg/dL — ABNORMAL HIGH (ref 0.44–1.00)
GFR, Estimated: 10 mL/min — ABNORMAL LOW (ref >60)
Glucose, Bld: 272 mg/dL — ABNORMAL HIGH (ref 70–99)
Phosphorus: 4.2 mg/dL (ref 2.5–4.6)
Potassium: 4.9 mmol/L (ref 3.5–5.1)
Sodium: 131 mmol/L — ABNORMAL LOW (ref 135–145)

## 2022-12-14 LAB — PROTEIN / CREATININE RATIO, URINE
Creatinine, Urine: 86 mg/dL
Protein Creatinine Ratio: 1.08 mg/mg{Cre} — ABNORMAL HIGH (ref 0.00–0.15)
Total Protein, Urine: 93 mg/dL

## 2022-12-14 LAB — POCT HEMOGLOBIN-HEMACUE: Hemoglobin: 10.2 g/dL — ABNORMAL LOW (ref 12.0–15.0)

## 2022-12-14 MED ORDER — EPOETIN ALFA-EPBX 4000 UNIT/ML IJ SOLN: 4000.0000 [IU] | Freq: Once | INTRAMUSCULAR | Status: DC

## 2022-12-14 NOTE — Progress Notes (Signed)
Diagnosis: Anemia in Chronic Kidney Disease  Provider:  Manpreet Bhutani MD  Procedure: Injection   Medication not given, Hgb 10.2  Post Care: Observation period declined.  Discharge: Condition: Good, Destination: Home . AVS Provided  Performed by:  Marin Shutter, RN

## 2022-12-15 LAB — MISC LABCORP TEST (SEND OUT)
LabCorp test name: 6718
Labcorp test code: 16881
Labcorp test code: 6718
Labcorp test code: 144050

## 2022-12-15 LAB — HEPATITIS B E ANTIGEN: Hep B E Ag: NEGATIVE

## 2022-12-15 LAB — PARATHYROID HORMONE, INTACT (NO CA): PTH: 11 pg/mL — ABNORMAL LOW (ref 15–65)

## 2022-12-15 LAB — HEPATITIS B SURFACE ANTIBODY, QUANTITATIVE: Hep B S AB Quant (Post): <3.5 m[IU]/mL — ABNORMAL LOW (ref >9.9)

## 2022-12-19 LAB — QUANTIFERON-TB GOLD PLUS: QuantiFERON-TB Gold Plus: NEGATIVE

## 2022-12-19 LAB — QUANTIFERON-TB GOLD PLUS (RQFGPL)
QuantiFERON Mitogen Value: 1.44 IU/mL
QuantiFERON Nil Value: 0.02 IU/mL
QuantiFERON TB1 Ag Value: 0.01 IU/mL
QuantiFERON TB2 Ag Value: 0.01 IU/mL

## 2022-12-28 ENCOUNTER — Encounter (HOSPITAL_COMMUNITY)
Admission: RE | Admit: 2022-12-28 | Discharge: 2022-12-28 | Disposition: A | Discharge status: Home / Self Care | Payer: Medicare HMO | Source: Ambulatory Visit | Attending: Nephrology | Admitting: Nephrology

## 2022-12-28 VITALS — BP 143/86 | HR 75 | Temp 98.1°F | Resp 17

## 2022-12-28 DIAGNOSIS — D631 Anemia in chronic kidney disease: Secondary | ICD-10-CM | POA: Insufficient documentation

## 2022-12-28 DIAGNOSIS — N185 Chronic kidney disease, stage 5: Secondary | ICD-10-CM | POA: Insufficient documentation

## 2022-12-28 LAB — POCT HEMOGLOBIN-HEMACUE: Hemoglobin: 9.2 g/dL — ABNORMAL LOW (ref 12.0–15.0)

## 2022-12-28 MED ORDER — EPOETIN ALFA-EPBX 4000 UNIT/ML IJ SOLN
4000.0000 [IU] | Freq: Once | INTRAMUSCULAR | Status: AC
  Administered 2022-12-28: 4000 [IU] via SUBCUTANEOUS

## 2022-12-28 NOTE — Progress Notes (Signed)
Diagnosis: Anemia in Chronic Kidney Disease  Provider:  Celso Amy MD  Procedure: Injection  Retacrit (epoetin alfa-epbx), Dose: 4000 Units, Site: subcutaneous, Number of injections: 1  Hbg 9.2  Post Care: Observation period completed  Discharge: Condition: Good, Destination: Home . AVS Provided  Performed by:  Forrest Moron, RN

## 2022-12-28 NOTE — Addendum Note (Signed)
Encounter addended by: Wyvonne Lenz, RN on: 12/28/2022 3:35 PM  Actions taken: Therapy plan modified

## 2022-12-31 ENCOUNTER — Other Ambulatory Visit: Payer: Self-pay | Admitting: Cardiology

## 2023-01-12 ENCOUNTER — Encounter (HOSPITAL_COMMUNITY)
Admission: RE | Admit: 2023-01-12 | Discharge: 2023-01-12 | Disposition: A | Discharge status: Home / Self Care | Payer: Medicare HMO | Source: Ambulatory Visit | Attending: Nephrology | Admitting: Nephrology

## 2023-01-12 VITALS — BP 113/68 | HR 67 | Temp 98.4°F | Resp 18

## 2023-01-12 DIAGNOSIS — D631 Anemia in chronic kidney disease: Secondary | ICD-10-CM

## 2023-01-12 DIAGNOSIS — N185 Chronic kidney disease, stage 5: Secondary | ICD-10-CM | POA: Diagnosis not present

## 2023-01-12 LAB — POCT HEMOGLOBIN-HEMACUE: Hemoglobin: 9.4 g/dL — ABNORMAL LOW (ref 12.0–15.0)

## 2023-01-12 MED ORDER — EPOETIN ALFA-EPBX 4000 UNIT/ML IJ SOLN
4000.0000 [IU] | Freq: Once | INTRAMUSCULAR | Status: AC
  Administered 2023-01-12: 4000 [IU] via SUBCUTANEOUS
  Filled 2023-01-12: qty 1

## 2023-01-12 NOTE — Progress Notes (Signed)
Diagnosis: Anemia in Chronic Kidney Disease  Provider:  Celso Amy MD  Procedure: Injection  Retacrit (epoetin alfa-epbx), Dose: 4000 Units, Site: subcutaneous, Number of injections: 1  Hbg 9.4  Post Care: Observation period completed  Discharge: Condition: Good, Destination: Home . AVS Provided  Performed by:  Marin Shutter, RN

## 2023-01-12 NOTE — Addendum Note (Signed)
Encounter addended by: Jamarria Real E, RN on: 01/12/2023 4:19 PM  Actions taken: Therapy plan modified

## 2023-01-26 ENCOUNTER — Encounter (HOSPITAL_COMMUNITY)
Admission: RE | Admit: 2023-01-26 | Discharge: 2023-01-26 | Disposition: A | Discharge status: Home / Self Care | Payer: Medicare HMO | Source: Ambulatory Visit | Attending: Nephrology | Admitting: Nephrology

## 2023-01-26 VITALS — BP 120/66 | HR 69 | Temp 97.6°F | Resp 18

## 2023-01-26 DIAGNOSIS — N185 Chronic kidney disease, stage 5: Secondary | ICD-10-CM | POA: Insufficient documentation

## 2023-01-26 DIAGNOSIS — D631 Anemia in chronic kidney disease: Secondary | ICD-10-CM | POA: Insufficient documentation

## 2023-01-26 LAB — CBC WITH DIFFERENTIAL/PLATELET
Abs Immature Granulocytes: 0.03 10*3/uL (ref 0.00–0.07)
Basophils Absolute: 0 10*3/uL (ref 0.0–0.1)
Basophils Relative: 0 %
Eosinophils Absolute: 0.4 10*3/uL (ref 0.0–0.5)
Eosinophils Relative: 5 %
HCT: 25.7 % — ABNORMAL LOW (ref 36.0–46.0)
Hemoglobin: 8.5 g/dL — ABNORMAL LOW (ref 12.0–15.0)
Immature Granulocytes: 0 %
Lymphocytes Relative: 17 %
Lymphs Abs: 1.5 10*3/uL (ref 0.7–4.0)
MCH: 29.5 pg (ref 26.0–34.0)
MCHC: 33.1 g/dL (ref 30.0–36.0)
MCV: 89.2 fL (ref 80.0–100.0)
Monocytes Absolute: 0.4 10*3/uL (ref 0.1–1.0)
Monocytes Relative: 5 %
Neutro Abs: 6.4 10*3/uL (ref 1.7–7.7)
Neutrophils Relative %: 73 %
Platelets: 312 10*3/uL (ref 150–400)
RBC: 2.88 MIL/uL — ABNORMAL LOW (ref 3.87–5.11)
RDW: 13.9 % (ref 11.5–15.5)
WBC: 8.7 10*3/uL (ref 4.0–10.5)
nRBC: 0 % (ref 0.0–0.2)

## 2023-01-26 LAB — PROTEIN / CREATININE RATIO, URINE
Creatinine, Urine: 143 mg/dL
Protein Creatinine Ratio: 0.76 mg/mg{Cre} — ABNORMAL HIGH (ref 0.00–0.15)
Total Protein, Urine: 109 mg/dL

## 2023-01-26 LAB — POCT HEMOGLOBIN-HEMACUE: Hemoglobin: 8.7 g/dL — ABNORMAL LOW (ref 12.0–15.0)

## 2023-01-26 MED ORDER — EPOETIN ALFA-EPBX 4000 UNIT/ML IJ SOLN
4000.0000 [IU] | Freq: Once | INTRAMUSCULAR | Status: AC
  Administered 2023-01-26: 4000 [IU] via SUBCUTANEOUS
  Filled 2023-01-26: qty 1

## 2023-01-26 NOTE — Progress Notes (Signed)
Diagnosis: Anemia in Chronic Kidney Disease  Provider:  Celso Amy MD  Procedure: Injection  Retacrit (epoetin alfa-epbx), Dose: 4000 Units, Site: subcutaneous, Number of injections: 1 Hgb. 8.7  Post Care: Observation period completed  Discharge: Condition: Good, Destination: Home . AVS Provided  Performed by:  Daleen Squibb, RN

## 2023-02-09 ENCOUNTER — Encounter (HOSPITAL_COMMUNITY): Admission: RE | Admit: 2023-02-09 | Payer: Medicare HMO | Source: Ambulatory Visit

## 2023-02-09 VITALS — BP 100/57 | HR 70 | Temp 98.1°F | Resp 18

## 2023-02-09 DIAGNOSIS — D631 Anemia in chronic kidney disease: Secondary | ICD-10-CM

## 2023-02-09 DIAGNOSIS — N185 Chronic kidney disease, stage 5: Secondary | ICD-10-CM | POA: Diagnosis not present

## 2023-02-09 LAB — POCT HEMOGLOBIN-HEMACUE: Hemoglobin: 8.8 g/dL — ABNORMAL LOW (ref 12.0–15.0)

## 2023-02-09 MED ORDER — EPOETIN ALFA-EPBX 4000 UNIT/ML IJ SOLN
4000.0000 [IU] | Freq: Once | INTRAMUSCULAR | Status: AC
  Administered 2023-02-09: 4000 [IU] via SUBCUTANEOUS

## 2023-02-09 NOTE — Progress Notes (Signed)
Diagnosis: Anemia in Chronic Kidney Disease  Provider:  Celso Amy MD  Procedure: Injection  Retacrit (epoetin alfa-epbx), Dose: 4000 Units, Site: subcutaneous, Number of injections: 1    Hgb. 8.8   Post Care: Observation period completed  Discharge: Condition: Good, Destination: Home . AVS Provided  Performed by:  Daleen Squibb, RN

## 2023-02-11 ENCOUNTER — Telehealth: Payer: Self-pay | Admitting: Pharmacy Technician

## 2023-02-11 NOTE — Telephone Encounter (Addendum)
Auth Submission: APPROVED Site of care: Site of care: [facility] Payer: [employer]ARE Medication & CPT/J Code(s) submitted: RETACRIT Q510 Route of submission (phone, fax, portal):  PORTAL/AVALITY Phone # Fax # Auth type: Buy/Bill PB Units/visits requested: 4000 UNITS Q2WKS AUTH: [id number] Approval from: 02/11/23 - 07/17/23  Auth has been updated with correct service site.

## 2023-02-12 ENCOUNTER — Other Ambulatory Visit: Payer: Self-pay

## 2023-02-14 ENCOUNTER — Encounter: Payer: Self-pay | Admitting: Nephrology

## 2023-02-22 ENCOUNTER — Other Ambulatory Visit: Payer: Self-pay

## 2023-02-23 ENCOUNTER — Inpatient Hospital Stay (HOSPITAL_COMMUNITY): Admission: RE | Admit: 2023-02-23 | Payer: Medicare HMO | Source: Ambulatory Visit

## 2023-02-23 ENCOUNTER — Other Ambulatory Visit: Payer: Self-pay | Admitting: Nephrology

## 2023-02-23 ENCOUNTER — Encounter: Payer: Medicare HMO | Attending: Nephrology | Admitting: *Deleted

## 2023-02-23 DIAGNOSIS — D631 Anemia in chronic kidney disease: Secondary | ICD-10-CM | POA: Diagnosis not present

## 2023-02-23 DIAGNOSIS — N185 Chronic kidney disease, stage 5: Secondary | ICD-10-CM | POA: Diagnosis not present

## 2023-02-23 MED ORDER — EPOETIN ALFA-EPBX 4000 UNIT/ML IJ SOLN: 4000.0000 [IU] | Freq: Once | INTRAMUSCULAR | Status: DC

## 2023-02-23 NOTE — Addendum Note (Signed)
Addended by: Orson Aloe on: 02/23/2023 03:57 PM   Modules accepted: Orders

## 2023-02-23 NOTE — Progress Notes (Signed)
Hgb. 10.5 no injection needed.

## 2023-02-27 ENCOUNTER — Encounter: Payer: Self-pay | Admitting: "Endocrinology

## 2023-02-27 ENCOUNTER — Ambulatory Visit: Payer: Medicare HMO | Admitting: "Endocrinology

## 2023-02-27 VITALS — BP 126/64 | HR 16 | Ht 61.0 in | Wt 144.6 lb

## 2023-02-27 DIAGNOSIS — N185 Chronic kidney disease, stage 5: Secondary | ICD-10-CM | POA: Diagnosis not present

## 2023-02-27 DIAGNOSIS — Z794 Long term (current) use of insulin: Secondary | ICD-10-CM | POA: Diagnosis not present

## 2023-02-27 DIAGNOSIS — I10 Essential (primary) hypertension: Secondary | ICD-10-CM

## 2023-02-27 DIAGNOSIS — E1122 Type 2 diabetes mellitus with diabetic chronic kidney disease: Secondary | ICD-10-CM | POA: Insufficient documentation

## 2023-02-27 DIAGNOSIS — E782 Mixed hyperlipidemia: Secondary | ICD-10-CM | POA: Diagnosis not present

## 2023-02-27 LAB — POCT GLYCOSYLATED HEMOGLOBIN (HGB A1C): HbA1c, POC (controlled diabetic range): 8.3 % — AB (ref 0.0–7.0)

## 2023-02-27 MED ORDER — TRESIBA FLEXTOUCH 100 UNIT/ML ~~LOC~~ SOPN: 34.0000 [IU] | PEN_INJECTOR | Freq: Every day | SUBCUTANEOUS | 2 refills | Status: DC

## 2023-02-27 NOTE — Progress Notes (Signed)
02/27/2023  Endocrinology follow-up note  Subjective:    Patient ID: Maria Parrish, female    DOB: 1946-03-22, PCP [facility]   Past Medical History:  Diagnosis Date   Assistance needed for mobility    walks with walker   Confusion    Dementia (HCC)    Diabetes mellitus without complication (HCC)    Generalized weakness    Hypercalcemia    Hyperlipidemia    Hyperparathyroidism (HCC)    Hypertension    Hypertension    Moderate protein-calorie malnutrition (HCC)    Peripheral neuropathy    UTI (lower urinary tract infection)    Past Surgical History:  Procedure Laterality Date   COLONOSCOPY N/A 08/12/2015   Procedure: COLONOSCOPY;  Surgeon: Corbin Ade, MD;  Location: [facility];  Service: Endoscopy;  Laterality: N/A;  1245pm   ESOPHAGOGASTRODUODENOSCOPY N/A 08/12/2015   Procedure: ESOPHAGOGASTRODUODENOSCOPY (EGD);  Surgeon: Corbin Ade, MD;  Location: [facility];  Service: Endoscopy;  Laterality: N/A;   none     Social History   Socioeconomic History   Marital status: Widowed    Spouse name: Not on file   Number of children: 2   Years of education: Not on file   Highest education level: Not on file  Occupational History   Occupation: fabricator for [employer]  Tobacco Use   Smoking status: Never   Smokeless tobacco: Never  Vaping Use   Vaping status: Never Used  Substance and Sexual Activity   Alcohol use: No   Drug use: No   Sexual activity: Not Currently  Other Topics Concern   Not on file  Social History Narrative   Not on file   Social Determinants of Health   Financial Resource Strain: Not on file  Food Insecurity: Not on file  Transportation Needs: No Transportation Needs (08/20/2020)   PRAPARE - Transportation    Lack of Transportation (Medical): No    Lack of Transportation (Non-Medical): No  Physical Activity: Inactive (08/20/2020)   Exercise Vital Sign    Days of Exercise per Week: 0 days    Minutes of Exercise per  Session: 0 min  Stress: Not on file  Social Connections: Not on file   Outpatient Encounter Medications as of 02/27/2023  Medication Sig   amLODipine (NORVASC) 10 MG tablet Take 1 tablet by mouth once daily   aspirin EC 81 MG tablet Take 81 mg by mouth daily.   Azelastine HCl 137 MCG/SPRAY SOLN Place into both nostrils.   B-D ULTRAFINE III SHORT PEN 31G X 8 MM MISC USE 1 PEN NEEDLE ONCE DAILY AS DIRECTED   carvedilol (COREG) 12.5 MG tablet Take 1 tablet (12.5 mg total) by mouth 2 (two) times daily.   Cyanocobalamin (VITAMIN B-12 PO) Take 1 tablet by mouth daily.   ezetimibe (ZETIA) 10 MG tablet Take 1 tablet (10 mg total) by mouth daily.   ferrous sulfate 325 (65 FE) MG tablet Take 325 mg by mouth daily.   furosemide (LASIX) 80 MG tablet Take 80 mg by mouth 2 (two) times daily.   hydrALAZINE (APRESOLINE) 100 MG tablet Take 1 tablet by mouth twice daily   insulin degludec (TRESIBA FLEXTOUCH) 100 UNIT/ML FlexTouch Pen Inject 34 Units into the skin at bedtime.   isosorbide mononitrate (IMDUR) 30 MG 24 hr tablet Take 1 tablet (30 mg total) by mouth daily.   Potassium Chloride ER 20 MEQ TBCR 1 tablet 2 (two) times daily.   sodium bicarbonate 650  MG tablet Take 650 mg by mouth daily.   [DISCONTINUED] calcitRIOL (ROCALTROL) 0.25 MCG capsule TAKE 1 CAPSULE BY MOUTH THREE TIMES A WEEK   [DISCONTINUED] insulin degludec (TRESIBA FLEXTOUCH) 100 UNIT/ML FlexTouch Pen Inject 40 Units into the skin at bedtime. (Patient taking differently: Inject 30 Units into the skin at bedtime.)   No facility-administered encounter medications on file as of 02/27/2023.   ALLERGIES: Allergies  Allergen Reactions   Penicillins Other (See Comments)    Makes patient sore.  Has patient had a PCN reaction causing immediate rash, facial/tongue/throat swelling, SOB or lightheadedness with hypotension: No Has patient had a PCN reaction causing severe rash involving mucus membranes or skin necrosis: No Has patient had a PCN  reaction that required hospitalization No Has patient had a PCN reaction occurring within the last 10 years: No If all of the above answers are "NO", then may proceed with Cephalosporin use.    Statins Other (See Comments)    Makes wrists and fingers ache Other reaction(s): Muscle Pain   Statins Support [Acid Blockers Support]     Other reaction(s): joint pain   VACCINATION STATUS:  There is no immunization history on file for this patient.  Diabetes   -77 year old female patient with medical history as above.   She was originally seen for secondary hyperparathyroidism with elevated PTH as a result of CKD-now worsening to stage 5, on regular follow-up with nephrology.  Reportedly, she declined renal replacement therapy with hemodialysis.  -She is being seen in follow-up for management of her currently and chronically  uncontrolled, type 2 diabetes.    She continues to struggle with control of her diabetes care. After reluctance for several months, she accepted low-dose basal insulin.  During her last visit, she was advised to take Tresiba 40 units nightly.  She lowered her insulin to 30 units by her own with unclear reasons.  She presents with average blood glucose of 187, point-of-care A1c of 8.3%.  This is slight improvement from 8.9% during her last visit.    She is not monitoring blood glucose optimally.   She denies any hypoglycemia.  She worries about hypoglycemia.    Due to worsening CKD following up with nephrology, her next appointment is in 3 days.  She reports that she was approached for dialysis, however declined it.    She has no new complaints today.  - Her thyroid ultrasound shows mild goiter with no discrete nodules. - She has no new complaints.  Review of Systems Limited as above.   Objective:    BP 126/64   Pulse (!) 16   Ht 5\' 1"  (1.549 m)   Wt 144 lb 9.6 oz (65.6 kg)   BMI 27.32 kg/m   Wt Readings from Last 3 Encounters:  02/27/23 144 lb 9.6 oz (65.6  kg)  11/21/22 145 lb (65.8 kg)  10/10/22 141 lb (64 kg)        Latest Ref Rng & Units 02/23/2023    3:18 PM 12/14/2022    3:41 PM 11/02/2022    3:29 PM  CMP  Glucose 65 - 99 mg/dL 098  119  147   BUN 7 - 25 mg/dL 70  80  93   Creatinine 0.60 - 1.00 mg/dL 8.29  5.62  1.30   Sodium 135 - 146 mmol/L 133  131  128   Potassium 3.5 - 5.3 mmol/L 4.6  4.9  4.9   Chloride 98 - 110 mmol/L 103  103  98  CO2 20 - 32 mmol/L 19  18  19    Calcium 8.6 - 10.4 mg/dL 8.6 - 95.2 mg/dL 9.2    9.2  9.8  9.5    9.7      Diabetic Labs (most recent): Lab Results  Component Value Date   HGBA1C 8.3 (A) 02/27/2023   HGBA1C 8.9 (A) 11/21/2022   HGBA1C 8.9 (H) 07/13/2022   Lipid Panel     Component Value Date/Time   CHOL 243 (H) 07/13/2022 1602   CHOL 212 (H) 12/29/2021 1303   TRIG 185 (H) 07/13/2022 1602   HDL 38 (L) 07/13/2022 1602   HDL 40 12/29/2021 1303   CHOLHDL 6.4 07/13/2022 1602   VLDL 37 07/13/2022 1602   LDLCALC 168 (H) 07/13/2022 1602   LDLCALC 145 (H) 12/29/2021 1303   LABVLDL 27 12/29/2021 1303      Assessment & Plan:   1. 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  She does not monitor blood glucose for safe adjustment of insulin. She worries about hypoglycemia.  In light of her presentation with still high A1c of 8.3%, she will need a higher dose of insulin.  I advised her to increase her Evaristo Bury to34 units nightly,  associated with  monitoring of blood glucose at least twice a day-before breakfast and at bedtime.  She is encouraged to call clinic for blood glucose less than 70 or  greater than  200 mg/dL.  She is not a candidate for metformin.  -She does not have a safe oral regimen to manage diabetes. Patient is known to be alarmingly noncompliant/nonadherent. Her renal function is worsening, GFR below 10, patient is a candidate for dialysis.  She thinks her kidneys are  "fine due to the fact that she does not feel any symptoms".   Reportedly, she is declining this option.  I highly encouraged her to discuss it further with her nephrologist .  - she acknowledges that there is a room for improvement in her food and drink choices. - Suggestion is made for her to avoid simple carbohydrates  from her diet including Cakes, Sweet Desserts, Ice Cream, Soda (diet and regular), Sweet Tea, Candies, Chips, Cookies, Store Bought Juices, Alcohol in Excess of  1-2 drinks a day, Artificial Sweeteners,  Coffee Creamer, and "Sugar-free" Products, Lemonade. This will help patient to have more stable blood glucose profile and potentially avoid unintended weight gain.  2. Hypercalcemia: Resolved  - Her repeat labs show normal calcium of 9.2,  PTH remaining stable between  1001. This is due to her advancing CKD.  - She is vitamin D replete now . -She is encouraged to stay close to her nephrologist.   3. Essential hypertension  -Her blood pressure is controlled to target.  She is advised to continue lisinopril 30 mg p.o. daily at breakfast,  metoprolol 25 mg p.o. daily, isosorbide 30 mg p.o. daily, hydralazine 100 mg p.o. twice daily, amlodipine 5 mg p.o. daily, and Lasix as needed.  She will not need more prescriptions.  She is advised to be consistent with her available medications.  She was given refills on her  isosorbide based on her request.   - She is hesitant to add any additional therapy, but willing to discuss this with her PMD.   4. Goiter -Her thyroid ultrasound showed mild goiter with no discrete nodules. No intervention needed at this point.    - I advised patient to maintain close follow up with  [facility] for primary care needs.  I spent  25  minutes in the care of the patient today including review of labs from CMP, Lipids, Thyroid Function, Hematology (current and previous including abstractions from other facilities); face-to-face time discussing  her blood glucose readings/logs, discussing  hypoglycemia and hyperglycemia episodes and symptoms, medications doses, her options of short and long term treatment based on the latest standards of care / guidelines;  discussion about incorporating lifestyle medicine;  and documenting the encounter. Risk reduction counseling performed per USPSTF guidelines to reduce  cardiovascular risk factors.     Please refer to Patient Instructions for Blood Glucose Monitoring and Insulin/Medications Dosing Guide"  in media tab for additional information. Please  also refer to " Patient Self Inventory" in the Media  tab for reviewed elements of pertinent patient history.  Dala M Rodd participated in the discussions, expressed understanding, and voiced agreement with the above plans.  All questions were answered to her satisfaction. she is encouraged to contact clinic should she have any questions or concerns prior to her return visit.    Follow up plan: Return in about 4 months (around 06/29/2023) for Bring Meter/CGM Device/Logs- A1c in Office.  Marquis Lunch, MD Phone: [phone]  Fax: [phone]  -  This note was partially dictated with voice recognition software. Similar sounding words can be transcribed inadequately or may not  be corrected upon review.  02/27/2023, 6:59 PM

## 2023-02-27 NOTE — Patient Instructions (Signed)
                                     Advice for Weight Management  -For most of us the best way to lose weight is by diet management. Generally speaking, diet management means consuming less calories intentionally which over time brings about progressive weight loss.  This can be achieved more effectively by avoiding ultra processed carbohydrates, processed meats, unhealthy fats.    It is critically important to know your numbers: how much calorie you are consuming and how much calorie you need. More importantly, our carbohydrates sources should be unprocessed naturally occurring  complex starch food items.  It is always important to balance nutrition also by  appropriate intake of proteins (mainly plant-based), healthy fats/oils, plenty of fruits and vegetables.   -The American College of Lifestyle Medicine (ACL M) recommends nutrition derived mostly from Whole Food, Plant Predominant Sources example an apple instead of applesauce or apple pie. Eat Plenty of vegetables, Mushrooms, fruits, Legumes, Whole Grains, Nuts, seeds in lieu of processed meats, processed snacks/pastries red meat, poultry, eggs.  Use only water or unsweetened tea for hydration.  The College also recommends the need to stay away from risky substances including alcohol, smoking; obtaining 7-9 hours of restorative sleep, at least 150 minutes of moderate intensity exercise weekly, importance of healthy social connections, and being mindful of stress and seek help when it is overwhelming.    -Sticking to a routine mealtime to eat 3 meals a day and avoiding unnecessary snacks is shown to have a big role in weight control. Under normal circumstances, the only time we burn stored energy is when we are hungry, so allow  some hunger to take place- hunger means no food between appropriate meal times, only water.  It is not advisable to starve.   -It is better to avoid simple carbohydrates including:  Cakes, Sweet Desserts, Ice Cream, Soda (diet and regular), Sweet Tea, Candies, Chips, Cookies, Store Bought Juices, Alcohol in Excess of  1-2 drinks a day, Lemonade,  Artificial Sweeteners, Doughnuts, Coffee Creamers, "Sugar-free" Products, etc, etc.  This is not a complete list.....    -Consulting with certified diabetes educators is proven to provide you with the most accurate and current information on diet.  Also, you may be  interested in discussing diet options/exchanges , we can schedule a visit with Maria Parrish, RDN, CDE for individualized nutrition education.  -Exercise: If you are able: 30 -60 minutes a day ,4 days a week, or 150 minutes of moderate intensity exercise weekly.    The longer the better if tolerated.  Combine stretch, strength, and aerobic activities.  If you were told in the past that you have high risk for cardiovascular diseases, or if you are currently symptomatic, you may seek evaluation by your heart doctor prior to initiating moderate to intense exercise programs.                                  Additional Care Considerations for Diabetes/Prediabetes   -Diabetes  is a chronic disease.  The most important care consideration is regular follow-up with your diabetes care provider with the goal being avoiding or delaying its complications and to take advantage of advances in medications and technology.  If appropriate actions are taken early enough, type 2 diabetes can even be   reversed.  Seek information from the right source.  - Whole Food, Plant Predominant Nutrition is highly recommended: Eat Plenty of vegetables, Mushrooms, fruits, Legumes, Whole Grains, Nuts, seeds in lieu of processed meats, processed snacks/pastries red meat, poultry, eggs as recommended by American College of  Lifestyle Medicine (ACLM).  -Type 2 diabetes is known to coexist with other important comorbidities such as high blood pressure and high cholesterol.  It is critical to control not only the  diabetes but also the high blood pressure and high cholesterol to minimize and delay the risk of complications including coronary artery disease, stroke, amputations, blindness, etc.  The good news is that this diet recommendation for type 2 diabetes is also very helpful for managing high cholesterol and high blood blood pressure.  - Studies showed that people with diabetes will benefit from a class of medications known as ACE inhibitors and statins.  Unless there are specific reasons not to be on these medications, the standard of care is to consider getting one from these groups of medications at an optimal doses.  These medications are generally considered safe and proven to help protect the heart and the kidneys.    - People with diabetes are encouraged to initiate and maintain regular follow-up with eye doctors, foot doctors, dentists , and if necessary heart and kidney doctors.     - It is highly recommended that people with diabetes quit smoking or stay away from smoking, and get yearly  flu vaccine and pneumonia vaccine at least every 5 years.  See above for additional recommendations on exercise, sleep, stress management , and healthy social connections.      

## 2023-03-09 ENCOUNTER — Inpatient Hospital Stay (HOSPITAL_COMMUNITY)
Admission: RE | Admit: 2023-03-09 | Discharge: 2023-03-09 | Disposition: A | Discharge status: Home / Self Care | Payer: Medicare HMO | Source: Ambulatory Visit

## 2023-03-09 ENCOUNTER — Encounter (INDEPENDENT_AMBULATORY_CARE_PROVIDER_SITE_OTHER): Payer: Medicare HMO | Admitting: Internal Medicine

## 2023-03-09 VITALS — BP 101/66 | HR 65 | Temp 98.2°F | Resp 16

## 2023-03-09 DIAGNOSIS — D631 Anemia in chronic kidney disease: Secondary | ICD-10-CM

## 2023-03-09 DIAGNOSIS — N185 Chronic kidney disease, stage 5: Secondary | ICD-10-CM | POA: Diagnosis not present

## 2023-03-09 MED ORDER — EPOETIN ALFA-EPBX 4000 UNIT/ML IJ SOLN
4000.0000 [IU] | Freq: Once | INTRAMUSCULAR | Status: AC
  Administered 2023-03-09: 4000 [IU] via SUBCUTANEOUS

## 2023-03-09 NOTE — Progress Notes (Signed)
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 AVS Provided  Performed by:  Feliberto Harts, LPN

## 2023-03-20 ENCOUNTER — Other Ambulatory Visit: Payer: Self-pay | Admitting: Student

## 2023-03-23 ENCOUNTER — Encounter: Payer: Medicare HMO | Attending: Nephrology | Admitting: *Deleted

## 2023-03-23 VITALS — BP 145/67 | HR 69 | Temp 97.8°F | Resp 18

## 2023-03-23 DIAGNOSIS — N185 Chronic kidney disease, stage 5: Secondary | ICD-10-CM | POA: Insufficient documentation

## 2023-03-23 DIAGNOSIS — D631 Anemia in chronic kidney disease: Secondary | ICD-10-CM | POA: Diagnosis not present

## 2023-03-23 LAB — GLUCOSE HEMOCUE WAIVED: Hemoglobin: 9.2

## 2023-03-23 MED ORDER — EPOETIN ALFA-EPBX 4000 UNIT/ML IJ SOLN
4000.0000 [IU] | Freq: Once | INTRAMUSCULAR | Status: AC
  Administered 2023-03-23: 4000 [IU] via SUBCUTANEOUS

## 2023-03-23 NOTE — Progress Notes (Signed)
Diagnosis: Anemia in Chronic Kidney Disease  Provider:  Celso Amy MD  Procedure: Injection  Retacrit (epoetin alfa-epbx), Dose: 4000 Units, Site: subcutaneous, Number of injections: 1  Hgb. 9.2  Post Care: Observation period completed  Discharge: Condition: Good, Destination: Home . AVS Provided  Performed by:  Daleen Squibb, RN

## 2023-04-10 ENCOUNTER — Other Ambulatory Visit: Payer: Self-pay

## 2023-04-10 ENCOUNTER — Other Ambulatory Visit: Payer: Self-pay | Admitting: Cardiology

## 2023-04-10 ENCOUNTER — Encounter (HOSPITAL_COMMUNITY): Payer: Self-pay | Admitting: *Deleted

## 2023-04-10 ENCOUNTER — Emergency Department (HOSPITAL_COMMUNITY): Payer: Medicare HMO

## 2023-04-10 ENCOUNTER — Emergency Department (HOSPITAL_COMMUNITY)
Admission: EM | Admit: 2023-04-10 | Discharge: 2023-04-10 | Disposition: A | Discharge status: Home / Self Care | Payer: Medicare HMO | Attending: Emergency Medicine | Admitting: Emergency Medicine

## 2023-04-10 DIAGNOSIS — S3992XA Unspecified injury of lower back, initial encounter: Secondary | ICD-10-CM | POA: Diagnosis present

## 2023-04-10 DIAGNOSIS — E1165 Type 2 diabetes mellitus with hyperglycemia: Secondary | ICD-10-CM | POA: Insufficient documentation

## 2023-04-10 DIAGNOSIS — I130 Hypertensive heart and chronic kidney disease with heart failure and stage 1 through stage 4 chronic kidney disease, or unspecified chronic kidney disease: Secondary | ICD-10-CM | POA: Insufficient documentation

## 2023-04-10 DIAGNOSIS — I509 Heart failure, unspecified: Secondary | ICD-10-CM | POA: Diagnosis not present

## 2023-04-10 DIAGNOSIS — E1122 Type 2 diabetes mellitus with diabetic chronic kidney disease: Secondary | ICD-10-CM | POA: Diagnosis not present

## 2023-04-10 DIAGNOSIS — S32592A Other specified fracture of left pubis, initial encounter for closed fracture: Secondary | ICD-10-CM | POA: Insufficient documentation

## 2023-04-10 DIAGNOSIS — F0390 Unspecified dementia without behavioral disturbance: Secondary | ICD-10-CM | POA: Diagnosis not present

## 2023-04-10 DIAGNOSIS — Z7982 Long term (current) use of aspirin: Secondary | ICD-10-CM | POA: Insufficient documentation

## 2023-04-10 DIAGNOSIS — W19XXXA Unspecified fall, initial encounter: Secondary | ICD-10-CM

## 2023-04-10 DIAGNOSIS — Y92008 Other place in unspecified non-institutional (private) residence as the place of occurrence of the external cause: Secondary | ICD-10-CM | POA: Diagnosis not present

## 2023-04-10 DIAGNOSIS — S3210XA Unspecified fracture of sacrum, initial encounter for closed fracture: Secondary | ICD-10-CM | POA: Insufficient documentation

## 2023-04-10 DIAGNOSIS — W1839XA Other fall on same level, initial encounter: Secondary | ICD-10-CM | POA: Diagnosis not present

## 2023-04-10 DIAGNOSIS — Z79899 Other long term (current) drug therapy: Secondary | ICD-10-CM | POA: Diagnosis not present

## 2023-04-10 DIAGNOSIS — S32512A Fracture of superior rim of left pubis, initial encounter for closed fracture: Secondary | ICD-10-CM | POA: Diagnosis not present

## 2023-04-10 DIAGNOSIS — Z794 Long term (current) use of insulin: Secondary | ICD-10-CM | POA: Diagnosis not present

## 2023-04-10 DIAGNOSIS — N189 Chronic kidney disease, unspecified: Secondary | ICD-10-CM | POA: Diagnosis not present

## 2023-04-10 DIAGNOSIS — R030 Elevated blood-pressure reading, without diagnosis of hypertension: Secondary | ICD-10-CM

## 2023-04-10 LAB — CBC
HCT: 31.9 % — ABNORMAL LOW (ref 36.0–46.0)
Hemoglobin: 10.4 g/dL — ABNORMAL LOW (ref 12.0–15.0)
MCH: 28.3 pg (ref 26.0–34.0)
MCHC: 32.6 g/dL (ref 30.0–36.0)
MCV: 86.9 fL (ref 80.0–100.0)
Platelets: 306 10*3/uL (ref 150–400)
RBC: 3.67 MIL/uL — ABNORMAL LOW (ref 3.87–5.11)
RDW: 13.9 % (ref 11.5–15.5)
WBC: 9.1 10*3/uL (ref 4.0–10.5)
nRBC: 0 % (ref 0.0–0.2)

## 2023-04-10 LAB — BASIC METABOLIC PANEL
Anion gap: 11 (ref 5–15)
BUN: 52 mg/dL — ABNORMAL HIGH (ref 8–23)
CO2: 18 mmol/L — ABNORMAL LOW (ref 22–32)
Calcium: 9.7 mg/dL (ref 8.9–10.3)
Chloride: 106 mmol/L (ref 98–111)
Creatinine, Ser: 3.34 mg/dL — ABNORMAL HIGH (ref 0.44–1.00)
GFR, Estimated: 14 mL/min — ABNORMAL LOW (ref >60)
Glucose, Bld: 182 mg/dL — ABNORMAL HIGH (ref 70–99)
Potassium: 4.8 mmol/L (ref 3.5–5.1)
Sodium: 135 mmol/L (ref 135–145)

## 2023-04-10 LAB — CBG MONITORING, ED: Glucose-Capillary: 152 mg/dL — ABNORMAL HIGH (ref 70–99)

## 2023-04-10 MED ORDER — HYDRALAZINE HCL 25 MG PO TABS
100.0000 mg | ORAL_TABLET | Freq: Once | ORAL | Status: AC
  Administered 2023-04-10: 100 mg via ORAL
  Filled 2023-04-10: qty 4

## 2023-04-10 MED ORDER — ISOSORBIDE MONONITRATE ER 30 MG PO TB24
30.0000 mg | ORAL_TABLET | Freq: Once | ORAL | Status: AC
  Administered 2023-04-10: 30 mg via ORAL
  Filled 2023-04-10: qty 1

## 2023-04-10 MED ORDER — AMLODIPINE BESYLATE 5 MG PO TABS
10.0000 mg | ORAL_TABLET | Freq: Once | ORAL | Status: AC
  Administered 2023-04-10: 10 mg via ORAL
  Filled 2023-04-10: qty 2

## 2023-04-10 NOTE — Discharge Instructions (Addendum)
As we discussed, CT imaging of your hip shows that you have 2 fractures in your pelvis area.  These are nonweightbearing bones and therefore you may walk around as you can tolerate with your pain.  I recommend using a walker for stabilization when you are walking.  I have also given you a referral to an orthopedist with a number to call to schedule an appointment for follow-up.  Please call them at your earliest convenience.  Additionally, your blood pressure was quite elevated today.  Is very important that you take all of your home medications as prescribed every day regardless of what other medications you are taking.  Please call your primary doctor to schedule follow-up appointment.  Return if development of any new or worsening symptoms.

## 2023-04-10 NOTE — ED Notes (Signed)
ED Provider at bedside. 

## 2023-04-10 NOTE — ED Triage Notes (Signed)
Pt states she fell Wednesday evening, c/o left hip pain since fall. Difficultly with ambulation, denies hitting her head with fall and denies taking blood thinners.

## 2023-04-10 NOTE — ED Notes (Addendum)
Pt was able to walk several feet w/ walker.  Son reports she has been able to ambulate around her home w/ device since her fall several days ago. EDP aware.

## 2023-04-10 NOTE — ED Provider Triage Note (Signed)
Emergency Medicine Provider Triage Evaluation Note  Maria Parrish , a 77 y.o. female  was evaluated in triage.  Pt complains of L hip pain after fall x 6 days ago. Was at home, got dizzy and fell onto her L hip. No head trauma or LOC. Feels pain radiating from her L hip down her leg, describes it as a burning pain. L leg feels weaker  Review of Systems  Positive: L hip pain Negative:   Physical Exam  BP (!) 184/94 (BP Location: Left Arm)   Pulse 65   Temp 98 F (36.7 C) (Oral)   Resp 18   Ht 5\' 1"  (1.549 m)   Wt 67.1 kg   SpO2 99%   BMI 27.96 kg/m  Gen:   Awake, no distress   Resp:  Normal effort  MSK:   Moves extremities without difficulty  Other:    Medical Decision Making  Medically screening exam initiated at 12:41 PM.  Appropriate orders placed.  Maria Parrish was informed that the remainder of the evaluation will be completed by another provider, this initial triage assessment does not replace that evaluation, and the importance of remaining in the ED until their evaluation is complete.  X-rays ordered   Shia Delaine T, PA-C 04/10/23 1242

## 2023-04-10 NOTE — ED Notes (Signed)
Discharge instructions reviewed and opportunity for questions or concerns provided.   Encouraged to make notification with referred orthopedic as soon as possible.   Pt alert, oriented and displays no signs of distress on departure.

## 2023-04-10 NOTE — ED Notes (Signed)
Pt's BP 228/88.  Sts she hasn't taken her HTN medication since her fall x5 days ago.  Sts "I was scared to mix medications."

## 2023-04-10 NOTE — ED Provider Notes (Signed)
[place] EMERGENCY DEPARTMENT AT [facility] Provider Note   CSN: [id number] Arrival date & time: 04/10/23  1122     History  Chief Complaint  Patient presents with   Maria Parrish is a 77 y.o. female.  Patient with history of dementia, diabetes, hypertension, CKD, PAD, CHF presents today with complaints of fall. She states that same occurred last Wednesday (6 days ago) when she was walking out of her kitchen and felt 'whoozy headed' and lost her balance and fell into a chair in the living room and then onto the floor on her left hip. She did not syncopize.  She did not hit her head or lose consciousness.  She is not anticoagulated. Since the fall she has had pain exclusively in the left hip. Previously, she was able to walk with a cane, however now she has been requiring a walker. She has been able to walk to the bathroom which her son at bedside reports is essentially all she was doing prior to the fall. She lives at home with her 2 sons who care for her. She denies any additional episodes of feeling 'whoozy headed.' Denies headaches, vision changes, chest pain, shortness of breath, nausea, vomiting, abdominal pain, or diarrhea.  Additionally, of note patient has not been taking her home medications including her blood pressure medications or her insulin. She has not been checking her blood sugar either. She states that she stopped taking these medications when she fell because she was taking Tylenol and ibuprofen for her pain and states that 'I was concerned about mixing medications.'  The history is provided by the patient. No language interpreter was used.  Fall       Home Medications Prior to Admission medications   Medication Sig Start Date End Date Taking? Authorizing Provider  amLODipine (NORVASC) 10 MG tablet Take 1 tablet by mouth once daily 01/01/23   Antoine Poche, MD  aspirin EC 81 MG tablet Take 81 mg by mouth daily.    [provider]   Azelastine HCl 137 MCG/SPRAY SOLN Place into both nostrils. 08/21/21   [provider]  B-D ULTRAFINE III SHORT PEN 31G X 8 MM MISC USE 1 PEN NEEDLE ONCE DAILY AS DIRECTED 10/31/21   Roma Kayser, MD  carvedilol (COREG) 12.5 MG tablet Take 1 tablet (12.5 mg total) by mouth 2 (two) times daily. 09/23/21 04/07/23  Antoine Poche, MD  Cyanocobalamin (VITAMIN B-12 PO) Take 1 tablet by mouth daily.    [provider]  ezetimibe (ZETIA) 10 MG tablet Take 1 tablet (10 mg total) by mouth daily. 04/06/22   Antoine Poche, MD  ferrous sulfate 325 (65 FE) MG tablet Take 325 mg by mouth daily. 02/05/20   [provider]  furosemide (LASIX) 80 MG tablet Take 80 mg by mouth 2 (two) times daily.    [provider]  hydrALAZINE (APRESOLINE) 100 MG tablet Take 1 tablet by mouth twice daily 03/21/23   Iran Ouch, [place] M, PA-C  insulin degludec (TRESIBA FLEXTOUCH) 100 UNIT/ML FlexTouch Pen Inject 34 Units into the skin at bedtime. 02/27/23   Roma Kayser, MD  isosorbide mononitrate (IMDUR) 30 MG 24 hr tablet Take 1 tablet (30 mg total) by mouth daily. 12/29/20   Roma Kayser, MD  Potassium Chloride ER 20 MEQ TBCR 1 tablet 2 (two) times daily.    [provider]  sodium bicarbonate 650 MG tablet Take 650 mg by mouth daily.  [provider]      Allergies    Penicillins, Statins, and Statins support [acid blockers support]    Review of Systems   Review of Systems  Musculoskeletal:  Positive for arthralgias.  All other systems reviewed and are negative.   Physical Exam Updated Vital Signs BP (!) 228/88 Comment: Pt hasn't taken HTN medication x5 days.  Pulse (!) 58   Temp 98 F (36.7 C) (Oral)   Resp 18   Ht 5\' 1"  (1.549 m)   Wt 67.1 kg   SpO2 98%   BMI 27.96 kg/m  Physical Exam Vitals and nursing note reviewed.  Constitutional:      General: She is not in acute distress.    Appearance: Normal appearance. She is normal  weight. She is not ill-appearing, toxic-appearing or diaphoretic.  HENT:     Head: Normocephalic and atraumatic.     Comments: No racoon eyes No battle sign Cardiovascular:     Rate and Rhythm: Normal rate and regular rhythm.     Heart sounds: Normal heart sounds.  Pulmonary:     Effort: Pulmonary effort is normal. No respiratory distress.     Breath sounds: Normal breath sounds.  Abdominal:     General: Abdomen is flat.     Palpations: Abdomen is soft.     Tenderness: There is no abdominal tenderness.  Musculoskeletal:        General: Normal range of motion.     Cervical back: Normal and normal range of motion.     Thoracic back: Normal.     Lumbar back: Normal.     Comments: No midline tenderness, no stepoffs or deformity noted on palpation of cervical, thoracic, and lumbar spine  TTP left lateral hip area. Patient able to lift her left leg off the bed without assistance with some discomfort.  No obvious deformity.  No external rotation or shortening.  DP and PT pulses intact and 2+.  Patient observed to be ambulatory with a walker with minimal assistance  No other areas of focal bony tenderness.  Skin:    General: Skin is warm and dry.  Neurological:     General: No focal deficit present.     Mental Status: She is alert.  Psychiatric:        Mood and Affect: Mood normal.        Behavior: Behavior normal.     ED Results / Procedures / Treatments   Labs (all labs ordered are listed, but only abnormal results are displayed) Labs Reviewed  CBC - Abnormal; Notable for the following components:      Result Value   RBC 3.67 (*)    Hemoglobin 10.4 (*)    HCT 31.9 (*)    All other components within normal limits  BASIC METABOLIC PANEL - Abnormal; Notable for the following components:   CO2 18 (*)    Glucose, Bld 182 (*)    BUN 52 (*)    Creatinine, Ser 3.34 (*)    GFR, Estimated 14 (*)    All other components within normal limits  CBG MONITORING, ED - Abnormal;  Notable for the following components:   Glucose-Capillary 152 (*)    All other components within normal limits    EKG EKG Interpretation Date/Time:  Tuesday April 10 2023 14:54:02 EDT Ventricular Rate:  60 PR Interval:  132 QRS Duration:  77 QT Interval:  454 QTC Calculation: 454 R Axis:   -13  Text Interpretation: Sinus rhythm Abnormal R-wave  progression, early transition LVH by voltage Borderline T abnormalities, lateral leads No significant change since prior 10/21 Confirmed by Meridee Score [phone]) on 04/10/2023 3:28:30 PM  Radiology CT Hip Left Wo Contrast  Result Date: 04/10/2023 CLINICAL DATA:  Hip trauma, suspicion for occult fracture. EXAM: CT OF THE LEFT HIP WITHOUT CONTRAST TECHNIQUE: Multidetector CT imaging of the left hip was performed according to the standard protocol. Multiplanar CT image reconstructions were also generated. RADIATION DOSE REDUCTION: This exam was performed according to the departmental dose-optimization program which includes automated exposure control, adjustment of the mA and/or kV according to patient size and/or use of iterative reconstruction technique. COMPARISON:  Radiographs 04/10/2023 FINDINGS: Bones/Joint/Cartilage Acute relatively nondisplaced fracture of the left inferior pubic ramus observed on image 71 series 3. Subtle acute nondisplaced fracture of the left superior pubicd ramus laterally on images 58 through 62 of series 6. Such fractures are usually accompanied by a sacral fracture. There are some subtle irregularities along the midline of the sacrum at the S3 and S5 levels which are candidates for possible sacral fracture site although neither is definitive. Substantial arthropathy of the left hip with spurring of the left femoral head and acetabulum as well as mild acetabular protrusio. Geode noted along the left femoral head adjacent to the fovea causing some bony scalloping in this vicinity. Do not see a fracture the left proximal  femur. Ligaments Suboptimally assessed by CT. Muscles and Tendons Indistinctness of tissue planes along the left hip adductor musculature likely related to the adjacent inferior pubic ramus fracture causing inflammation. No substantial presacral fluid collection. Soft tissues Multiple calcified uterine fibroids leading to an irregular contour of the uterus. Iliac atheromatous vascular disease. IMPRESSION: 1. Acute nondisplaced fractures of the left superior and inferior pubic rami. 2. Subtle irregularities along the midline of the sacrum at the S3 and S5 levels are candidates for possible sacral fracture site although neither is definitive. 3. Substantial arthropathy of the left hip with spurring of the left femoral head and acetabulum as well as mild acetabular protrusio. 4. Multiple calcified uterine fibroids leading to an irregular contour of the uterus. 5. Iliac atheromatous vascular disease. Electronically Signed   By: Gaylyn Rong M.D.   On: 04/10/2023 17:01   DG Hip Unilat W or Wo Pelvis 2-3 Views Left  Result Date: 04/10/2023 CLINICAL DATA:  Fall last week with difficulty bearing weight. EXAM: DG HIP (WITH OR WITHOUT PELVIS) 2-3V LEFT COMPARISON:  None Available. FINDINGS: There is no evidence of acute hip fracture or dislocation. No significant arthropathy. The visualized bony pelvis is intact without fracture or diastasis. Multiple calcifications in the pelvis felt to relate to a combination of calcified phleboliths and degenerated uterine fibroids. IMPRESSION: 1. No evidence of acute hip fracture or dislocation. 2. Multiple calcifications in the pelvis felt to relate to a combination of calcified phleboliths and degenerated uterine fibroids. Electronically Signed   By: Irish Lack M.D.   On: 04/10/2023 13:47    Procedures Procedures    Medications Ordered in ED Medications  amLODipine (NORVASC) tablet 10 mg (10 mg Oral Given 04/10/23 1442)  hydrALAZINE (APRESOLINE) tablet 100 mg (100  mg Oral Given 04/10/23 1442)  isosorbide mononitrate (IMDUR) 24 hr tablet 30 mg (30 mg Oral Given 04/10/23 1441)    ED Course/ Medical Decision Making/ A&P  Medical Decision Making Amount and/or Complexity of Data Reviewed Labs: ordered. Radiology: ordered.  Risk Prescription drug management.   This patient is a 77 y.o. female who presents to the ED for concern of fall x 6 days ago, hip pain, this involves an extensive number of treatment options, and is a complaint that carries with it a high risk of complications and morbidity. The emergent differential diagnosis prior to evaluation includes, but is not limited to,  trauma, electrolyte derangement, arrhythmia, dehydration . This is not an exhaustive differential.   Past Medical History / Co-morbidities / Social History:  has a past medical history of Assistance needed for mobility, Confusion, Dementia (HCC), Diabetes mellitus without complication (HCC), Generalized weakness, Hypercalcemia, Hyperlipidemia, Hyperparathyroidism (HCC), Hypertension, Hypertension, Moderate protein-calorie malnutrition (HCC), Peripheral neuropathy, and UTI (lower urinary tract infection).  Additional history: Chart reviewed.  Physical Exam: Physical exam performed. The pertinent findings include: TTP left hip area without obvious deformity.  DP PT pulses intact and 2+.  Patient able to ambulate with a walker with minimal assistance.  Lab Tests: I ordered, and personally interpreted labs.  The pertinent results include: Hgb 10.4, consistent with previous likely due to patient's CKD.  Bicarb 18 consistent with previous likely due to CKD.  Glucose 182.  BUN 52, creatinine 3.34 consistent with previous.   Imaging Studies: I ordered imaging studies including DG left hip, CT left hip. I independently visualized and interpreted imaging which showed   DG:  1. No evidence of acute hip fracture or dislocation. 2. Multiple  calcifications in the pelvis felt to relate to a combination of calcified phleboliths and degenerated uterine fibroids.  CT: 1. Acute nondisplaced fractures of the left superior and inferior pubic rami. 2. Subtle irregularities along the midline of the sacrum at the S3 and S5 levels are candidates for possible sacral fracture site although neither is definitive. 3. Substantial arthropathy of the left hip with spurring of the left femoral head and acetabulum as well as mild acetabular protrusio. 4. Multiple calcified uterine fibroids leading to an irregular contour of the uterus. 5. Iliac atheromatous vascular disease.  I agree with the radiologist interpretation.   Cardiac Monitoring:  The patient was maintained on a cardiac monitor.  Cardiac monitor showed an underlying rhythm of: sinus rhythm, no STEMI or arrhythmia. I agree with this interpretation.   Medications: I ordered medication including norvasc, hydralazine, imdur  for hypertension. Reevaluation of the patient after these medicines showed that the patient improved. I have reviewed the patients home medicines and have made adjustments as needed.  Consultations Obtained: I requested consultation with the orthopedic surgery on-call Dr. Dallas Schimke,  and discussed lab and imaging findings as well as pertinent plan - they recommend: Discharge to home and weight-bear as tolerated.  Follow-up outpatient.   Disposition: After consideration of the diagnostic results and the patients response to treatment, I feel that emergency department workup does not suggest an emergent condition requiring admission or immediate intervention beyond what has been performed at this time. The plan is: Discharge with close outpatient orthopedic and PCP follow-up.  Patient is able to walk with a walker and perform her ADLs similarly to prior to her fall.  Her pain is controlled here without medications. Her 2 sons who she lives with care for her almost entirely and  do all the cooking and cleaning required. Patients son at bedside feels comfortable taking her home. Evaluation and diagnostic testing in the emergency department does not suggest an emergent condition requiring admission  or immediate intervention beyond what has been performed at this time.  Plan for discharge with close PCP follow-up.  Patient is understanding and amenable with plan, educated on red flag symptoms that would prompt immediate return.  Patient discharged in stable condition.  Final Clinical Impression(s) / ED Diagnoses Final diagnoses:  Fall, initial encounter  Closed fracture of multiple pubic rami, left, initial encounter (HCC)  Closed fracture of sacrum, unspecified portion of sacrum, initial encounter (HCC)  Elevated blood pressure reading    Rx / DC Orders ED Discharge Orders     None     An After Visit Summary was printed and given to the patient.     Vear Clock 04/10/23 1913    Terrilee Files, MD 04/11/23 [phone]

## 2023-04-20 ENCOUNTER — Encounter: Payer: Medicare HMO | Attending: Nephrology | Admitting: *Deleted

## 2023-04-20 VITALS — BP 156/79 | HR 66 | Temp 98.2°F | Resp 14

## 2023-04-20 DIAGNOSIS — N185 Chronic kidney disease, stage 5: Secondary | ICD-10-CM | POA: Diagnosis not present

## 2023-04-20 DIAGNOSIS — D631 Anemia in chronic kidney disease: Secondary | ICD-10-CM

## 2023-04-20 LAB — GLUCOSE HEMOCUE WAIVED: Hemoglobin: 10.4

## 2023-04-20 MED ORDER — EPOETIN ALFA-EPBX 4000 UNIT/ML IJ SOLN: 4000.0000 [IU] | Freq: Once | INTRAMUSCULAR | Status: DC

## 2023-04-20 NOTE — Progress Notes (Signed)
Hgb 10.4 - no injection needed

## 2023-04-25 ENCOUNTER — Ambulatory Visit (INDEPENDENT_AMBULATORY_CARE_PROVIDER_SITE_OTHER): Payer: Medicare HMO | Admitting: Orthopedic Surgery

## 2023-04-25 ENCOUNTER — Encounter: Payer: Self-pay | Admitting: Orthopedic Surgery

## 2023-04-25 ENCOUNTER — Other Ambulatory Visit (INDEPENDENT_AMBULATORY_CARE_PROVIDER_SITE_OTHER): Payer: Self-pay

## 2023-04-25 DIAGNOSIS — S32512A Fracture of superior rim of left pubis, initial encounter for closed fracture: Secondary | ICD-10-CM | POA: Diagnosis not present

## 2023-04-25 DIAGNOSIS — S32592A Other specified fracture of left pubis, initial encounter for closed fracture: Secondary | ICD-10-CM | POA: Diagnosis not present

## 2023-04-25 DIAGNOSIS — S32512D Fracture of superior rim of left pubis, subsequent encounter for fracture with routine healing: Secondary | ICD-10-CM | POA: Diagnosis not present

## 2023-04-25 DIAGNOSIS — S32592D Other specified fracture of left pubis, subsequent encounter for fracture with routine healing: Secondary | ICD-10-CM

## 2023-04-25 DIAGNOSIS — R102 Pelvic and perineal pain: Secondary | ICD-10-CM

## 2023-04-25 NOTE — Patient Instructions (Signed)
Use a walker to help with ambulation

## 2023-04-25 NOTE — Progress Notes (Signed)
Orthopaedic Clinic Return  Assessment: Maria Parrish is a 77 y.o. female with the following: Left sided superior and inferior pubic rami fractures, nondisplaced  Plan: Maria Parrish fell and sustained a left sided superior and inferior pubic rami fractures.  Pain is controlled with Tylenol and Advil.  She should ambulate with the assistance of a walker.  Okay to bear weight as tolerated.  I would like to see her back in 1 month.  Will reassess the radiating pains that she is having at that time.  Follow-up: Return in about 4 weeks (around 05/23/2023).   Subjective:  Chief Complaint  Patient presents with   Fracture    DOI 04/07/23    History of Present Illness: Maria Parrish is a 77 y.o. female who returns to clinic for evaluation of left hip pain.  She was at home and fell, after feeling dizzy.  She had left-sided hip pain.  She presented to emergency department.  She was noted to have superior and inferior pubic rami fractures.  No numbness or tingling.  She is taking Advil for pain.  She notes that she is having some pain in the left buttock, radiating distally to her foot.  Review of Systems: No fevers or chills No numbness or tingling No chest pain No shortness of breath No bowel or bladder dysfunction No GI distress No headaches   Objective: There were no vitals taken for this visit.  Physical Exam:  Alert and oriented.  No acute distress.  Seated in wheelchair.  Left hip without deformity.  No tenderness to palpation laterally.  She is able to maintain straight leg raise.  No pain with axial loading.  She tolerates gentle range of motion of the left hip.  Toes warm and well-perfused.  No swelling in the left knee.  No tenderness to palpation about the knee.  IMAGING: I personally ordered and reviewed the following images:   AP pelvis was obtained in clinic today.  No acute injuries noted.  Minimally displaced fractures of the superior and inferior pubic rami on the left,  are barely visible.  No obvious callus formation.  No additional injuries are noted.  Impression: Stable left superior and inferior pubic rami fracture   Maria Barre, MD 04/25/2023 10:59 AM

## 2023-05-04 ENCOUNTER — Encounter: Payer: Medicare HMO | Admitting: *Deleted

## 2023-05-04 VITALS — BP 145/73 | HR 74 | Temp 97.8°F | Resp 16

## 2023-05-04 DIAGNOSIS — N185 Chronic kidney disease, stage 5: Secondary | ICD-10-CM | POA: Diagnosis not present

## 2023-05-04 DIAGNOSIS — D631 Anemia in chronic kidney disease: Secondary | ICD-10-CM

## 2023-05-04 LAB — GLUCOSE HEMOCUE WAIVED: Hemoglobin: 9.8

## 2023-05-04 MED ORDER — EPOETIN ALFA-EPBX 4000 UNIT/ML IJ SOLN
4000.0000 [IU] | Freq: Once | INTRAMUSCULAR | Status: AC
  Administered 2023-05-04: 4000 [IU] via SUBCUTANEOUS

## 2023-05-04 NOTE — Progress Notes (Signed)
Diagnosis: Anemia in Chronic Kidney Disease  Provider:  Celso Amy MD  Procedure: Injection  Retacrit (epoetin alfa-epbx), Dose: 4000 Units, Site: subcutaneous, Number of injections: 1  Post Care: Observation period completed  Discharge: Condition: Good, Destination: Home . AVS Provided  Performed by:  Daleen Squibb, RN

## 2023-05-11 ENCOUNTER — Ambulatory Visit: Payer: Medicare HMO | Attending: Student | Admitting: Student

## 2023-05-11 ENCOUNTER — Encounter: Payer: Self-pay | Admitting: *Deleted

## 2023-05-11 ENCOUNTER — Encounter: Payer: Self-pay | Admitting: Student

## 2023-05-11 VITALS — BP 138/68 | HR 74 | Ht 64.0 in | Wt 144.8 lb

## 2023-05-11 DIAGNOSIS — I10 Essential (primary) hypertension: Secondary | ICD-10-CM

## 2023-05-11 DIAGNOSIS — I5032 Chronic diastolic (congestive) heart failure: Secondary | ICD-10-CM

## 2023-05-11 DIAGNOSIS — N184 Chronic kidney disease, stage 4 (severe): Secondary | ICD-10-CM

## 2023-05-11 DIAGNOSIS — E782 Mixed hyperlipidemia: Secondary | ICD-10-CM | POA: Diagnosis not present

## 2023-05-11 MED ORDER — EZETIMIBE 10 MG PO TABS: 10.0000 mg | ORAL_TABLET | Freq: Every day | ORAL | 3 refills | Status: DC

## 2023-05-11 MED ORDER — HYDRALAZINE HCL 100 MG PO TABS: 100.0000 mg | ORAL_TABLET | Freq: Two times a day (BID) | ORAL | 3 refills | Status: DC

## 2023-05-11 NOTE — Progress Notes (Signed)
Cardiology Office Note    Date:  05/11/2023  ID:  Maria Parrish, DOB 11-25-1945, MRN [id number] Cardiologist: Dina Rich, MD    History of Present Illness:    Maria Parrish is a 77 y.o. 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  She was examined by Dr. Wyline Mood in 09/2022 and reported that her weight had been stable and denied any recent chest pain or dyspnea on exertion. Medical therapy for her prior cardiomyopathy had been limited given her renal dysfunction and diuretic therapy had previously been deferred to Nephrology. She was continued on Amlodipine 10 mg daily, ASA 81 mg daily, Coreg 12.5 mg twice daily, Zetia 10 mg daily, Hydralazine 100mg  BID, Lasix 80 mg twice daily and Imdur 30 mg daily.  In the interim, she was evaluated at [facility] ED on 04/10/2023 after suffering a fall and having left hip pain. She reported having not yet taken her medications and BP was significantly elevated at 228/88. Imaging did show acute nondisplaced fractures of the left superior and inferior pubic rami and she was informed to follow-up with orthopedics as an outpatient.  In talking with the patient and her son today, she reports things have overall been stable since her last office visit. She does have some hip pain following her recent fracture but says this is improving.  Reports she has not been told she will require surgical intervention or PT at this point. Fall was mechanical and she denies any presyncope or syncope at that time. Activity is typically limited around the house and she ambulates with a walker. She denies any chest pain or dyspnea on exertion with this. No specific orthopnea, PND or pitting edema. Reports occasional dizziness with positional changes and she does try to change positions slowly.   Studies Reviewed:    EKG: EKG is not ordered today. EKG from 04/10/2023 is reviewed and shows NSR, HR 60 with no acute ST changes.   Echocardiogram: 06/2021 IMPRESSIONS     1. Left ventricular ejection fraction, by estimation, is 55 to 60%. The  left ventricle has normal function. The left ventricle has no regional  wall motion abnormalities. There is moderate left ventricular hypertrophy.  Left ventricular diastolic  parameters are consistent with Grade I diastolic dysfunction (impaired  relaxation). Elevated left atrial pressure.   2. Right ventricular systolic function is normal. The right ventricular  size is normal. There is normal pulmonary artery systolic pressure.   3. The mitral valve is normal in structure. Trivial mitral valve  regurgitation. No evidence of mitral stenosis.   4. The aortic valve is tricuspid. Aortic valve regurgitation is not  visualized. No aortic stenosis is present.   5. The inferior vena cava is normal in size with greater than 50%  respiratory variability, suggesting right atrial pressure of 3 mmHg.    Physical Exam:   VS:  BP 138/68   Pulse 74   Ht 5\' 4"  (1.626 m)   Wt 144 lb 12.8 oz (65.7 kg)   SpO2 97%   BMI 24.85 kg/m    Wt Readings from Last 3 Encounters:  05/11/23 144 lb 12.8 oz (65.7 kg)  04/10/23 148 lb (67.1 kg)  02/27/23 144 lb 9.6 oz (65.6 kg)     GEN: Pleasant, elderly female appearing in no acute distress NECK:  No JVD; No carotid bruits CARDIAC: RRR, no murmurs, rubs, gallops RESPIRATORY:  Clear to auscultation without rales, wheezing or rhonchi  ABDOMEN: Appears non-distended. No obvious abdominal masses. EXTREMITIES: No clubbing or cyanosis. Trace ankle edema bilaterally.  Distal pedal pulses are 2+ bilaterally.   Assessment and Plan:   1. Chronic HFimpEF - Her EF was at 40-45% by echo in 04/2020, normalized to 55-60% by echo in 06/2021. She denies any recent respiratory issues and only has trace ankle edema on examination today and lungs  are clear. Diuretic therapy has been deferred to Nephrology given her advanced CKD and she remains on Lasix 80 mg twice daily. Continue Coreg 12.5 mg twice daily, Hydralazine 100 mg twice daily and Imdur 30 mg daily. Not on an ACE-I/ARB/ANRI/MRA/SGLT2 inhibitor given her CKD.   2. HTN - BP is at 138/68 during today's visit and she reports this has been well-controlled when checked at home. Would not further increase medications given episodic dizziness. We reviewed the importance of changing positions slowly and she was encouraged to utilize compression stockings. Continue current medical therapy with Amlodipine 10 mg daily, Coreg 12.5 mg twice daily and Hydralazine 100 mg twice daily.  3. HLD - LDL was at 168 in 06/2022 but she was not taking Zetia at that time. She has been intolerant to statins and remains on Zetia 10 mg daily. Will request a copy of most recent labs from her PCP. If LDL remains above goal, can refer to the Lipid Clinic.  4. Stage 4-5 CKD - Creatinine was at 3.34 in 03/2023 which is close to her known baseline. She also has chronic anemia and receives Retacrit as needed. Followed by Dr. Wolfgang Phoenix.   Signed, Ellsworth Lennox, PA-C

## 2023-05-11 NOTE — Patient Instructions (Signed)
Medication Instructions:  Your physician recommends that you continue on your current medications as directed. Please refer to the Current Medication list given to you today.  *If you need a refill on your cardiac medications before your next appointment, please call your pharmacy*   Lab Work: NONE  If you have labs (blood work) drawn today and your tests are completely normal, you will receive your results only by: [place] (if you have MyChart) OR A paper copy in the mail If you have any lab test that is abnormal or we need to change your treatment, we will call you to review the results.   Testing/Procedures: NONE    Follow-Up: At [facility], you and your health needs are our priority.  As part of our continuing mission to provide you with exceptional heart care, we have created designated Provider Care Teams.  These Care Teams include your primary Cardiologist (physician) and Advanced Practice Providers (APPs -  Physician Assistants and Nurse Practitioners) who all work together to provide you with the care you need, when you need it.  We recommend signing up for the patient portal called "MyChart".  Sign up information is provided on this After Visit Summary.  MyChart is used to connect with patients for Virtual Visits (Telemedicine).  Patients are able to view lab/test results, encounter notes, upcoming appointments, etc.  Non-urgent messages can be sent to your provider as well.   To learn more about what you can do with MyChart, go to [email or web address].    Your next appointment:   6 month(s)  Provider:   You may see Carlyle Dolly, MD or one of the following Advanced Practice Providers on your designated Care Team:   Bernerd Pho, PA-C  Ermalinda Barrios, [place]     Other Instructions Thank you for choosing [facility]!

## 2023-05-14 ENCOUNTER — Encounter: Payer: Self-pay | Admitting: Emergency Medicine

## 2023-05-18 ENCOUNTER — Encounter: Payer: Medicare HMO | Attending: Nephrology | Admitting: *Deleted

## 2023-05-18 VITALS — BP 168/70 | HR 74 | Temp 97.8°F | Resp 18

## 2023-05-18 DIAGNOSIS — N185 Chronic kidney disease, stage 5: Secondary | ICD-10-CM

## 2023-05-18 DIAGNOSIS — D631 Anemia in chronic kidney disease: Secondary | ICD-10-CM | POA: Diagnosis not present

## 2023-05-18 LAB — GLUCOSE HEMOCUE WAIVED: Hemoglobin: 10.1

## 2023-05-18 MED ORDER — EPOETIN ALFA-EPBX 4000 UNIT/ML IJ SOLN: 4000.0000 [IU] | Freq: Once | INTRAMUSCULAR | Status: DC

## 2023-05-18 NOTE — Progress Notes (Signed)
Hgb. 10.1 no injection needed

## 2023-05-18 NOTE — Addendum Note (Signed)
Addended by: Tad Moore on: 05/18/2023 03:08 PM   Modules accepted: Orders

## 2023-05-23 ENCOUNTER — Other Ambulatory Visit (INDEPENDENT_AMBULATORY_CARE_PROVIDER_SITE_OTHER): Payer: Medicare HMO

## 2023-05-23 ENCOUNTER — Ambulatory Visit: Payer: Medicare HMO | Admitting: Orthopedic Surgery

## 2023-05-23 ENCOUNTER — Encounter: Payer: Self-pay | Admitting: Orthopedic Surgery

## 2023-05-23 DIAGNOSIS — S32592D Other specified fracture of left pubis, subsequent encounter for fracture with routine healing: Secondary | ICD-10-CM

## 2023-05-23 DIAGNOSIS — S32512D Fracture of superior rim of left pubis, subsequent encounter for fracture with routine healing: Secondary | ICD-10-CM

## 2023-05-23 NOTE — Progress Notes (Signed)
[facility] Return  Assessment: Maria Parrish is a 77 y.o. female with the following: Left sided superior and inferior pubic rami fractures, nondisplaced  Plan: Maria Parrish fell and sustained a left sided superior and inferior pubic rami fractures.  She is getting better.  Pain is improved.  Her mobility is better.  Radiographs remain stable.  Continue to ambulate, primarily with assistance of a walker.  No restrictions.  She will follow-up as needed.   Follow-up: Return if symptoms worsen or fail to improve.   Subjective:  Chief Complaint  Patient presents with   Hip Injury    Pelvis fracture / left pubic ramus     History of Present Illness: Maria Parrish is a 77 y.o. female who returns to clinic for evaluation of left hip pain.  She sustained a fall, approximately 6 weeks ago.  She has left-sided rami fractures.  She is doing better.  Her pain is better.  She is no longer complaining about radiating pains.  She states that she ambulates with the assistance of a walker.  Sometimes she will use a cane.  Review of Systems: No fevers or chills No numbness or tingling No chest pain No shortness of breath No bowel or bladder dysfunction No GI distress No headaches   Objective: There were no vitals taken for this visit.  Physical Exam:  Alert and oriented.  No acute distress.  Seated in wheelchair.  Evaluation of left hip demonstrates no swelling.  No bruising.  No tenderness to palpation laterally.  She is able to maintain straight leg raise.  She has no discomfort with axial loading.  She tolerates gentle range of motion of the hip.  Negative straight leg raise.  Sensation intact to the dorsum of the left foot.  IMAGING: I personally ordered and reviewed the following images:   AP pelvis was obtained in clinic today.  These are compared to prior x-rays.  Minimal displaced fracture of the superior and inferior pubic rami have remained stable.  No change in overall  alignment.  No interval displacement.  No obvious callus formation.  No bony lesions.  Impression: Left sided superior and inferior pubic rami fractures, stable alignment  Oliver Barre, MD 05/23/2023 1:22 PM

## 2023-06-01 ENCOUNTER — Encounter: Payer: Medicare HMO | Admitting: *Deleted

## 2023-06-01 VITALS — BP 167/76 | HR 74 | Temp 97.9°F | Resp 18

## 2023-06-01 DIAGNOSIS — D631 Anemia in chronic kidney disease: Secondary | ICD-10-CM | POA: Diagnosis not present

## 2023-06-01 DIAGNOSIS — N185 Chronic kidney disease, stage 5: Secondary | ICD-10-CM | POA: Diagnosis not present

## 2023-06-01 LAB — GLUCOSE HEMOCUE WAIVED: Hemoglobin: 9.3

## 2023-06-01 MED ORDER — EPOETIN ALFA-EPBX 4000 UNIT/ML IJ SOLN
4000.0000 [IU] | Freq: Once | INTRAMUSCULAR | Status: AC
Start: 2023-06-01 — End: 2023-06-01
  Administered 2023-06-01: 4000 [IU] via SUBCUTANEOUS

## 2023-06-01 MED ORDER — CLONIDINE HCL 0.1 MG PO TABS: 0.1000 mg | ORAL_TABLET | ORAL | Status: DC | PRN

## 2023-06-01 NOTE — Progress Notes (Signed)
Diagnosis: Anemia in Chronic Kidney Disease  Provider:  Celso Amy MD  Procedure: Injection  Retacrit (epoetin alfa-epbx), Dose: 4000 Units, Site: subcutaneous, Number of injections: 1  Hgb.   Post Care: Observation period completed  Discharge: Condition: Good, Destination: Home . AVS Provided  Performed by:  Daleen Squibb, RN

## 2023-06-10 ENCOUNTER — Other Ambulatory Visit: Payer: Self-pay | Admitting: "Endocrinology

## 2023-06-19 ENCOUNTER — Telehealth: Payer: Self-pay

## 2023-06-19 NOTE — Telephone Encounter (Signed)
Auth Submission: APPROVED Site of care: Site of care: [facility] Payer: [employer] Medication & CPT/J Code(s) submitted: retacrit [phone] Route of submission (phone, fax, portal): portal Phone # Fax # Auth type: Buy/Bill PB Units/visits requested: 4000u, q2weeks Reference number: [id number] Approval from: 07/18/2023 to 07/16/2024

## 2023-06-22 ENCOUNTER — Other Ambulatory Visit: Payer: Self-pay | Admitting: Emergency Medicine

## 2023-06-22 ENCOUNTER — Encounter: Payer: Medicare HMO | Attending: Nephrology | Admitting: Emergency Medicine

## 2023-06-22 VITALS — BP 169/72 | HR 77 | Temp 97.6°F | Resp 16

## 2023-06-22 DIAGNOSIS — N185 Chronic kidney disease, stage 5: Secondary | ICD-10-CM | POA: Insufficient documentation

## 2023-06-22 DIAGNOSIS — D631 Anemia in chronic kidney disease: Secondary | ICD-10-CM

## 2023-06-22 LAB — GLUCOSE HEMOCUE WAIVED: Hemoglobin: 10.2

## 2023-06-22 MED ORDER — EPOETIN ALFA-EPBX 4000 UNIT/ML IJ SOLN: 4000.0000 [IU] | Freq: Once | INTRAMUSCULAR | Status: DC

## 2023-06-22 MED ORDER — CLONIDINE HCL 0.1 MG PO TABS
0.1000 mg | ORAL_TABLET | ORAL | Status: DC | PRN
Start: 2023-06-22 — End: 2023-06-22

## 2023-06-22 NOTE — Progress Notes (Signed)
Diagnosis: Anemia in Chronic Kidney Disease  Provider:  Celso Amy MD  Procedure: Injection  Retacrit (epoetin alfa-epbx), Dose: 4000 Units, Site: subcutaneous, Number of injections: 0 Hgb 10.2  Post Care: Patient declined observation  Discharge: Condition: Good, Destination: Home . AVS Provided  Performed by:  Arrie Senate, RN

## 2023-06-23 LAB — CBC
HCT: 30.7 % — ABNORMAL LOW (ref 35.0–45.0)
Hemoglobin: 9.6 g/dL — ABNORMAL LOW (ref 11.7–15.5)
MCH: 28.7 pg (ref 27.0–33.0)
MCHC: 31.3 g/dL — ABNORMAL LOW (ref 32.0–36.0)
MCV: 91.6 fL (ref 80.0–100.0)
MPV: 11.8 fL (ref 7.5–12.5)
Platelets: 336 10*3/uL (ref 140–400)
RBC: 3.35 10*6/uL — ABNORMAL LOW (ref 3.80–5.10)
RDW: 13.2 % (ref 11.0–15.0)
WBC: 10.5 10*3/uL (ref 3.8–10.8)

## 2023-06-23 LAB — RENAL FUNCTION PANEL
Albumin: 4.1 g/dL (ref 3.6–5.1)
BUN/Creatinine Ratio: 13 (calc) (ref 6–22)
BUN: 52 mg/dL — ABNORMAL HIGH (ref 7–25)
CO2: 18 mmol/L — ABNORMAL LOW (ref 20–32)
Calcium: 10 mg/dL (ref 8.6–10.4)
Chloride: 112 mmol/L — ABNORMAL HIGH (ref 98–110)
Creat: 3.87 mg/dL — ABNORMAL HIGH (ref 0.60–1.00)
Glucose, Bld: 287 mg/dL — ABNORMAL HIGH (ref 65–99)
Phosphorus: 2.9 mg/dL (ref 2.1–4.3)
Potassium: 5.5 mmol/L — ABNORMAL HIGH (ref 3.5–5.3)
Sodium: 136 mmol/L (ref 135–146)

## 2023-06-23 LAB — PROTEIN / CREATININE RATIO, URINE
Creatinine, Urine: 75 mg/dL (ref 20–275)
Protein/Creat Ratio: 2413 mg/g{creat} — ABNORMAL HIGH (ref 24–184)
Protein/Creatinine Ratio: 2.413 mg/mg{creat} — ABNORMAL HIGH (ref 0.024–0.184)
Total Protein, Urine: 181 mg/dL — ABNORMAL HIGH (ref 5–24)

## 2023-06-23 LAB — IRON,TIBC AND FERRITIN PANEL
%SAT: 35 % (ref 16–45)
Ferritin: 400 ng/mL — ABNORMAL HIGH (ref 16–288)
Iron: 70 ug/dL (ref 45–160)
TIBC: 199 ug/dL — ABNORMAL LOW (ref 250–450)

## 2023-07-02 ENCOUNTER — Ambulatory Visit (INDEPENDENT_AMBULATORY_CARE_PROVIDER_SITE_OTHER): Payer: Medicare HMO | Admitting: "Endocrinology

## 2023-07-02 ENCOUNTER — Encounter: Payer: Self-pay | Admitting: "Endocrinology

## 2023-07-02 VITALS — BP 150/84 | HR 88 | Ht 64.0 in | Wt 142.8 lb

## 2023-07-02 DIAGNOSIS — I10 Essential (primary) hypertension: Secondary | ICD-10-CM

## 2023-07-02 DIAGNOSIS — N185 Chronic kidney disease, stage 5: Secondary | ICD-10-CM | POA: Diagnosis not present

## 2023-07-02 DIAGNOSIS — Z794 Long term (current) use of insulin: Secondary | ICD-10-CM | POA: Diagnosis not present

## 2023-07-02 DIAGNOSIS — E1122 Type 2 diabetes mellitus with diabetic chronic kidney disease: Secondary | ICD-10-CM | POA: Diagnosis not present

## 2023-07-02 DIAGNOSIS — E782 Mixed hyperlipidemia: Secondary | ICD-10-CM

## 2023-07-02 LAB — POCT GLYCOSYLATED HEMOGLOBIN (HGB A1C): HbA1c, POC (controlled diabetic range): 7.9 % — AB (ref 0.0–7.0)

## 2023-07-02 MED ORDER — TRESIBA FLEXTOUCH 100 UNIT/ML ~~LOC~~ SOPN: 24.0000 [IU] | PEN_INJECTOR | Freq: Every day | SUBCUTANEOUS | 0 refills | Status: DC

## 2023-07-02 NOTE — Progress Notes (Signed)
07/02/2023  Endocrinology follow-up note  Subjective:    Patient ID: Maria Parrish, female    DOB: 14-Aug-1945, PCP Ardath Sax, FNP   Past Medical History:  Diagnosis Date   Assistance needed for mobility    walks with walker   Confusion    Dementia (HCC)    Diabetes mellitus without complication (HCC)    Generalized weakness    Heart failure with improved ejection fraction (HFimpEF) (HCC)    a. EF 40-45% by echo in 04/2020 b. normalized to 55-60% by echo in 06/2021   Hypercalcemia    Hyperlipidemia    Hyperparathyroidism (HCC)    Hypertension    Hypertension    Moderate protein-calorie malnutrition (HCC)    Peripheral neuropathy    UTI (lower urinary tract infection)    Past Surgical History:  Procedure Laterality Date   COLONOSCOPY N/A 08/12/2015   Procedure: COLONOSCOPY;  Surgeon: Corbin Ade, MD;  Location: [facility];  Service: Endoscopy;  Laterality: N/A;  1245pm   ESOPHAGOGASTRODUODENOSCOPY N/A 08/12/2015   Procedure: ESOPHAGOGASTRODUODENOSCOPY (EGD);  Surgeon: Corbin Ade, MD;  Location: [facility];  Service: Endoscopy;  Laterality: N/A;   none     Social History   Socioeconomic History   Marital status: Widowed    Spouse name: Not on file   Number of children: 2   Years of education: Not on file   Highest education level: Not on file  Occupational History   Occupation: fabricator for [employer]  Tobacco Use   Smoking status: Never   Smokeless tobacco: Never  Vaping Use   Vaping status: Never Used  Substance and Sexual Activity   Alcohol use: No   Drug use: No   Sexual activity: Not Currently  Other Topics Concern   Not on file  Social History Narrative   Not on file   Social Drivers of Health   Financial Resource Strain: Not on file  Food Insecurity: Not on file  Transportation Needs: No Transportation Needs (08/20/2020)   PRAPARE - Transportation    Lack of Transportation (Medical): No    Lack of Transportation (Non-Medical): No   Physical Activity: Inactive (08/20/2020)   Exercise Vital Sign    Days of Exercise per Week: 0 days    Minutes of Exercise per Session: 0 min  Stress: Not on file  Social Connections: Not on file   Outpatient Encounter Medications as of 07/02/2023  Medication Sig   amLODipine (NORVASC) 10 MG tablet Take 1 tablet by mouth once daily   aspirin EC 81 MG tablet Take 81 mg by mouth daily.   Azelastine HCl 137 MCG/SPRAY SOLN Place into both nostrils.   B-D ULTRAFINE III SHORT PEN 31G X 8 MM MISC ISE 1 PEN NEEDLE ONCE DAILY AS DIRECTED   carvedilol (COREG) 12.5 MG tablet Take 1 tablet by mouth twice daily   Cyanocobalamin (VITAMIN B-12 PO) Take 1 tablet by mouth daily.   ezetimibe (ZETIA) 10 MG tablet Take 1 tablet (10 mg total) by mouth daily.   ferrous sulfate 325 (65 FE) MG tablet Take 325 mg by mouth daily. (Patient not taking: Reported on 05/11/2023)   hydrALAZINE (APRESOLINE) 100 MG tablet Take 1 tablet (100 mg total) by mouth 2 (two) times daily.   insulin degludec (TRESIBA FLEXTOUCH) 100 UNIT/ML FlexTouch Pen Inject 24 Units into the skin at bedtime.   isosorbide mononitrate (IMDUR) 30 MG 24 hr tablet Take 1 tablet (30 mg total) by mouth daily. (Patient not taking:  Reported on 07/02/2023)   Potassium Chloride ER 20 MEQ TBCR Take 0.5 tablets by mouth daily. Hold until next measurement   sodium bicarbonate 650 MG tablet Take 650 mg by mouth daily.   [DISCONTINUED] furosemide (LASIX) 80 MG tablet Take 80 mg by mouth 2 (two) times daily.   [DISCONTINUED] insulin degludec (TRESIBA FLEXTOUCH) 100 UNIT/ML FlexTouch Pen Inject 34 Units into the skin at bedtime. (Patient taking differently: Inject 20 Units into the skin at bedtime.)   No facility-administered encounter medications on file as of 07/02/2023.   ALLERGIES: Allergies  Allergen Reactions   Penicillins Other (See Comments)    Makes patient sore.  Has patient had a PCN reaction causing immediate rash, facial/tongue/throat swelling,  SOB or lightheadedness with hypotension: No Has patient had a PCN reaction causing severe rash involving mucus membranes or skin necrosis: No Has patient had a PCN reaction that required hospitalization No Has patient had a PCN reaction occurring within the last 10 years: No If all of the above answers are "NO", then may proceed with Cephalosporin use.    Statins Other (See Comments)    Makes wrists and fingers ache Other reaction(s): Muscle Pain   Statins Support [Acid Blockers Support]     Other reaction(s): joint pain   VACCINATION STATUS:  There is no immunization history on file for this patient.  Diabetes   -77 year old female patient with medical history as above.   She was originally seen for secondary hyperparathyroidism with elevated PTH as a result of CKD-now worsening to stage 5, on regular follow-up with nephrology.  she declined renal replacement therapy with hemodialysis.  -She is being seen in follow-up for management of her currently and chronically  uncontrolled, type 2 diabetes.    She continues to struggle with control of her diabetes care. After reluctance for several months, she accepted low-dose basal insulin.  During her last visit, she was advised to take [place] 34 units nightly.  She lowered her insulin to 20 units by her own with unclear reasons.  She presents with average blood glucose of 191 for the last 30 days monitoring once a day.  Her point-of-care A1c is 7.9%, however this patient has anemia as a result of her end-stage renal disease.    She is not monitoring blood glucose optimally.   She denies any hypoglycemia.  She worries about hypoglycemia.    Due to worsening CKD following up with nephrology.  She was advised to lower her potassium 3 days ago due to hyperkalemia of 5.5.    She reports that she was approached for dialysis, however declined it.    She has no new complaints today.  - Her thyroid ultrasound shows mild goiter with no discrete  nodules. - She has no new complaints.  Review of Systems Limited as above.   Objective:    BP (!) 150/84   Pulse 88   Ht 5\' 4"  (1.626 m)   Wt 142 lb 12.8 oz (64.8 kg)   BMI 24.51 kg/m   Wt Readings from Last 3 Encounters:  07/02/23 142 lb 12.8 oz (64.8 kg)  05/11/23 144 lb 12.8 oz (65.7 kg)  04/10/23 148 lb (67.1 kg)        Latest Ref Rng & Units 06/22/2023    3:21 PM 04/10/2023    3:11 PM 02/23/2023    3:18 PM  CMP  Glucose 65 - 99 mg/dL 295  621  308   BUN 7 - 25 mg/dL 52  52  70   Creatinine 0.60 - 1.00 mg/dL 4.74  2.59  5.63   Sodium 135 - 146 mmol/L 136  135  133   Potassium 3.5 - 5.3 mmol/L 5.5  4.8  4.6   Chloride 98 - 110 mmol/L 112  106  103   CO2 20 - 32 mmol/L 18  18  19    Calcium 8.6 - 10.4 mg/dL 87.5  9.7  9.2    9.2      Diabetic Labs (most recent): Lab Results  Component Value Date   HGBA1C 7.9 (A) 07/02/2023   HGBA1C 8.3 (A) 02/27/2023   HGBA1C 8.9 (A) 11/21/2022   Lipid Panel     Component Value Date/Time   CHOL 243 (H) 07/13/2022 1602   CHOL 212 (H) 12/29/2021 1303   TRIG 185 (H) 07/13/2022 1602   HDL 38 (L) 07/13/2022 1602   HDL 40 12/29/2021 1303   CHOLHDL 6.4 07/13/2022 1602   VLDL 37 07/13/2022 1602   LDLCALC 168 (H) 07/13/2022 1602   LDLCALC 145 (H) 12/29/2021 1303   LABVLDL 27 12/29/2021 1303      Assessment & Plan:   1. 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  She does not monitor blood glucose for safe adjustment of insulin. She worries about hypoglycemia.  In light of her presentation with still above target glycemic profile and point-of-care A1c of 7.9% despite anemia associated with end-stage renal disease, she will need a higher dose of insulin to control glycemia.     She accepts [place] 24 units nightly,   associated with  monitoring of blood glucose at least twice a day-before breakfast and at bedtime.  She is encouraged to call clinic for blood glucose less than 70 or   greater than  200 mg/dL.  She is not a candidate for metformin nor SGLT2 inhibitors. -She does not have a safe oral regimen to manage diabetes. Patient is known to be alarmingly noncompliant/nonadherent. Her renal function is worsening, GFR below 10, patient is a candidate for dialysis.  She thinks her kidneys are  "fine due to the fact that she does not feel any symptoms".  Reportedly, she is declining this option.  I highly encouraged her to discuss it further with her nephrologist .  - she acknowledges that there is a room for improvement in her food and drink choices. - Suggestion is made for her to avoid simple carbohydrates  from her diet including Cakes, Sweet Desserts, Ice Cream, Soda (diet and regular), Sweet Tea, Candies, Chips, Cookies, Store Bought Juices, Alcohol in Excess of  1-2 drinks a day, Artificial Sweeteners,  Coffee Creamer, and "Sugar-free" Products, Lemonade. This will help patient to have more stable blood glucose profile and potentially avoid unintended weight gain.  2. Hypercalcemia: Resolved  - Her repeat labs show normal calcium of 9.2,  PTH remaining stable between  1001. This is due to her advancing CKD.  - She is vitamin D replete now . -She is encouraged to stay close to her nephrologist.   3. Essential hypertension  -Her blood pressure is not controlled to target.  She is advised to continue lisinopril 30 mg p.o. daily at breakfast,  metoprolol 25 mg p.o. daily, isosorbide 30 mg p.o. daily, hydralazine 100 mg p.o. twice daily, amlodipine 5 mg p.o. daily, and Lasix as needed.  She will not need more prescriptions.  She is advised to be consistent with her available medications.  She was given  refills on her  isosorbide based on her request.   - She is hesitant to add any additional therapy, but willing to discuss this with her PMD.   4. Goiter -Her thyroid ultrasound showed mild goiter with no discrete nodules. No intervention needed at this point.    - I  advised patient to maintain close follow up with Ardath Sax, FNP for primary care needs.  I spent   22 minutes in the care of the patient today including review of labs from Thyroid Function, CMP, and other relevant labs ; imaging/biopsy records (current and previous including abstractions from other facilities); face-to-face time discussing  her lab results and symptoms, medications doses, her options of short and long term treatment based on the latest standards of care / guidelines;   and documenting the encounter.  Joci M Renn  participated in the discussions, expressed understanding, and voiced agreement with the above plans.  All questions were answered to her satisfaction. she is encouraged to contact clinic should she have any questions or concerns prior to her return visit.   Follow up plan: Return in about 4 months (around 10/31/2023) for Bring Meter/CGM Device/Logs- A1c in Office.  Marquis Lunch, MD Phone: [phone]  Fax: [phone]  -  This note was partially dictated with voice recognition software. Similar sounding words can be transcribed inadequately or may not  be corrected upon review.  07/02/2023, 4:58 PM

## 2023-07-06 ENCOUNTER — Encounter: Payer: Medicare HMO | Admitting: *Deleted

## 2023-07-06 VITALS — BP 172/79 | HR 79 | Temp 99.0°F | Resp 16

## 2023-07-06 DIAGNOSIS — N185 Chronic kidney disease, stage 5: Secondary | ICD-10-CM

## 2023-07-06 DIAGNOSIS — D631 Anemia in chronic kidney disease: Secondary | ICD-10-CM

## 2023-07-06 LAB — GLUCOSE HEMOCUE WAIVED: Hemoglobin: 9.1

## 2023-07-06 MED ORDER — EPOETIN ALFA-EPBX 4000 UNIT/ML IJ SOLN
4000.0000 [IU] | Freq: Once | INTRAMUSCULAR | Status: AC
  Administered 2023-07-06: 4000 [IU] via SUBCUTANEOUS

## 2023-07-06 NOTE — Progress Notes (Signed)
Diagnosis: Anemia in Chronic Kidney Disease  Provider:  Celso Amy MD  Procedure: Injection  Retacrit (epoetin alfa-epbx), Dose: 4000 Units, Site: subcutaneous, Number of injections: 1  Injection Site(s): Right arm  Hgb. 9.1  Post Care: Observation period completed  Discharge: Condition: Good, Destination: Home . AVS Provided  Performed by:  Daleen Squibb, RN

## 2023-07-20 ENCOUNTER — Other Ambulatory Visit: Payer: Self-pay

## 2023-07-20 ENCOUNTER — Encounter: Payer: Medicare HMO | Attending: Nephrology | Admitting: Emergency Medicine

## 2023-07-20 VITALS — BP 177/71 | HR 73 | Temp 97.6°F | Resp 16

## 2023-07-20 DIAGNOSIS — N185 Chronic kidney disease, stage 5: Secondary | ICD-10-CM | POA: Insufficient documentation

## 2023-07-20 DIAGNOSIS — D631 Anemia in chronic kidney disease: Secondary | ICD-10-CM | POA: Insufficient documentation

## 2023-07-20 LAB — GLUCOSE HEMOCUE WAIVED: Hemoglobin: 9.2

## 2023-07-20 MED ORDER — EPOETIN ALFA-EPBX 4000 UNIT/ML IJ SOLN
4000.0000 [IU] | Freq: Once | INTRAMUSCULAR | Status: AC
  Administered 2023-07-20: 4000 [IU] via SUBCUTANEOUS

## 2023-07-20 MED ORDER — CLONIDINE HCL 0.1 MG PO TABS: 0.1000 mg | ORAL_TABLET | ORAL | Status: DC | PRN

## 2023-07-20 NOTE — Progress Notes (Signed)
 Diagnosis: Anemia in Chronic Kidney Disease  Provider:  Rachele Amour MD  Procedure: Injection  Retacrit  (epoetin  alfa-epbx), Dose: 4000 Units, Site: subcutaneous, Number of injections: 1  Injection Site(s): Right arm Hgb 9.2   Post Care: Patient declined observation  Discharge: Condition: Good, Destination: Home . AVS Provided  Performed by:  Delon ONEIDA Officer, RN

## 2023-08-03 ENCOUNTER — Encounter (INDEPENDENT_AMBULATORY_CARE_PROVIDER_SITE_OTHER): Payer: Medicare HMO | Admitting: *Deleted

## 2023-08-03 VITALS — BP 149/81 | HR 61 | Temp 97.4°F

## 2023-08-03 DIAGNOSIS — N185 Chronic kidney disease, stage 5: Secondary | ICD-10-CM

## 2023-08-03 DIAGNOSIS — D631 Anemia in chronic kidney disease: Secondary | ICD-10-CM | POA: Diagnosis not present

## 2023-08-03 LAB — GLUCOSE HEMOCUE WAIVED: Hemoglobin: 8.8

## 2023-08-03 MED ORDER — EPOETIN ALFA-EPBX 4000 UNIT/ML IJ SOLN
4000.0000 [IU] | Freq: Once | INTRAMUSCULAR | Status: AC
  Administered 2023-08-03: 4000 [IU] via SUBCUTANEOUS

## 2023-08-03 MED ORDER — CLONIDINE HCL 0.1 MG PO TABS: 0.1000 mg | ORAL_TABLET | ORAL | Status: DC | PRN

## 2023-08-03 NOTE — Progress Notes (Signed)
 Diagnosis: Anemia in Chronic Kidney Disease  Provider:  Celso Amy MD  Procedure: Injection  Retacrit (epoetin alfa-epbx), Dose: 4000 Units, Site: subcutaneous, Number of injections: 1  Injection Site(s): Right arm  Post Care: Observation period completed  Discharge: Condition: Good, Destination: Home . AVS Provided  Performed by:  Daleen Squibb, RN

## 2023-08-17 ENCOUNTER — Encounter: Payer: Medicare HMO | Admitting: *Deleted

## 2023-08-17 VITALS — BP 178/82 | HR 68 | Temp 98.0°F

## 2023-08-17 DIAGNOSIS — D631 Anemia in chronic kidney disease: Secondary | ICD-10-CM

## 2023-08-17 DIAGNOSIS — N185 Chronic kidney disease, stage 5: Secondary | ICD-10-CM | POA: Diagnosis not present

## 2023-08-17 LAB — GLUCOSE HEMOCUE WAIVED: Hemoglobin: 8.4

## 2023-08-17 MED ORDER — EPOETIN ALFA-EPBX 4000 UNIT/ML IJ SOLN
4000.0000 [IU] | Freq: Once | INTRAMUSCULAR | Status: AC
Start: 2023-08-17 — End: 2023-08-17
  Administered 2023-08-17: 4000 [IU] via SUBCUTANEOUS
  Filled 2023-08-17: qty 1

## 2023-08-17 NOTE — Progress Notes (Signed)
 Diagnosis: Anemia in Chronic Kidney Disease  Provider:  Celso Amy MD  Procedure: Injection  Retacrit (epoetin alfa-epbx), Dose: 4000 Units, Site: subcutaneous, Number of injections: 1  Injection Site(s): Right arm  Post Care: Observation period completed  Discharge: Condition: Good, Destination: Home . AVS Provided  Performed by:  Daleen Squibb, RN

## 2023-08-31 ENCOUNTER — Encounter: Payer: Medicare HMO | Attending: Nephrology | Admitting: *Deleted

## 2023-08-31 VITALS — BP 168/67 | HR 63 | Temp 97.6°F | Resp 16

## 2023-08-31 DIAGNOSIS — N185 Chronic kidney disease, stage 5: Secondary | ICD-10-CM | POA: Diagnosis not present

## 2023-08-31 DIAGNOSIS — D631 Anemia in chronic kidney disease: Secondary | ICD-10-CM

## 2023-08-31 LAB — GLUCOSE HEMOCUE WAIVED: Hemoglobin: 8.5

## 2023-08-31 MED ORDER — EPOETIN ALFA-EPBX 4000 UNIT/ML IJ SOLN
4000.0000 [IU] | Freq: Once | INTRAMUSCULAR | Status: AC
  Administered 2023-08-31: 4000 [IU] via SUBCUTANEOUS

## 2023-08-31 NOTE — Progress Notes (Signed)
Diagnosis: Anemia in Chronic Kidney Disease  Provider:  Celso Amy MD  Procedure: Injection  Retacrit (epoetin alfa-epbx), Dose: 4000 Units, Site: subcutaneous, Number of injections: 1  Injection Site(s): Right arm  Post Care: Observation period completed  Discharge: Condition: Good, Destination: Home . AVS Provided  Performed by:  Daleen Squibb, RN

## 2023-09-14 ENCOUNTER — Encounter: Payer: Medicare HMO | Admitting: *Deleted

## 2023-09-14 VITALS — BP 133/67 | HR 57 | Temp 97.3°F | Resp 16

## 2023-09-14 DIAGNOSIS — N185 Chronic kidney disease, stage 5: Secondary | ICD-10-CM

## 2023-09-14 DIAGNOSIS — D631 Anemia in chronic kidney disease: Secondary | ICD-10-CM

## 2023-09-14 LAB — GLUCOSE HEMOCUE WAIVED: Hemoglobin: 7.8

## 2023-09-14 MED ORDER — EPOETIN ALFA-EPBX 4000 UNIT/ML IJ SOLN
4000.0000 [IU] | Freq: Once | INTRAMUSCULAR | Status: AC
  Administered 2023-09-14: 4000 [IU] via SUBCUTANEOUS

## 2023-09-14 NOTE — Progress Notes (Signed)
 Diagnosis: Anemia in Chronic Kidney Disease  Provider:  Celso Amy MD  Procedure: Injection  Retacrit (epoetin alfa-epbx), Dose: 4000 Units, Site: subcutaneous, Number of injections: 1  Injection Site(s): Left arm  Post Care: Observation period completed  Discharge: Condition: Good, Destination: Home . AVS Provided  Performed by:  Daleen Squibb, RN

## 2023-09-19 ENCOUNTER — Other Ambulatory Visit: Payer: Self-pay

## 2023-09-19 DIAGNOSIS — E1122 Type 2 diabetes mellitus with diabetic chronic kidney disease: Secondary | ICD-10-CM

## 2023-09-19 MED ORDER — ACCU-CHEK GUIDE ME W/DEVICE KIT: PACK | 0 refills | Status: DC

## 2023-09-19 MED ORDER — ACCU-CHEK GUIDE TEST VI STRP: ORAL_STRIP | 2 refills | Status: DC

## 2023-09-19 MED ORDER — BD SWAB SINGLE USE REGULAR PADS: MEDICATED_PAD | 2 refills | Status: DC

## 2023-09-19 MED ORDER — ACCU-CHEK SOFTCLIX LANCETS MISC: 2 refills | Status: DC

## 2023-09-21 ENCOUNTER — Other Ambulatory Visit: Payer: Self-pay

## 2023-09-21 DIAGNOSIS — E1122 Type 2 diabetes mellitus with diabetic chronic kidney disease: Secondary | ICD-10-CM

## 2023-09-21 MED ORDER — ACCU-CHEK SOFTCLIX LANCETS MISC
2 refills | Status: DC
Start: 2023-09-21 — End: 2024-01-28

## 2023-09-21 MED ORDER — ACCU-CHEK GUIDE ME W/DEVICE KIT: PACK | 0 refills | Status: DC

## 2023-09-21 MED ORDER — ACCU-CHEK GUIDE TEST VI STRP: ORAL_STRIP | 2 refills | Status: DC

## 2023-09-21 MED ORDER — BD SWAB SINGLE USE REGULAR PADS: MEDICATED_PAD | 2 refills | Status: DC

## 2023-09-28 ENCOUNTER — Encounter (HOSPITAL_COMMUNITY): Payer: Self-pay

## 2023-09-28 ENCOUNTER — Inpatient Hospital Stay (HOSPITAL_COMMUNITY)
Admission: EM | Admit: 2023-09-28 | Discharge: 2023-09-30 | DRG: 291 | Disposition: A | Discharge status: Home / Self Care | Attending: Internal Medicine | Admitting: Internal Medicine

## 2023-09-28 ENCOUNTER — Encounter: Payer: Medicare HMO | Attending: Nephrology

## 2023-09-28 ENCOUNTER — Emergency Department (HOSPITAL_COMMUNITY)

## 2023-09-28 ENCOUNTER — Other Ambulatory Visit: Payer: Self-pay

## 2023-09-28 DIAGNOSIS — Z79899 Other long term (current) drug therapy: Secondary | ICD-10-CM | POA: Diagnosis not present

## 2023-09-28 DIAGNOSIS — D631 Anemia in chronic kidney disease: Secondary | ICD-10-CM | POA: Diagnosis present

## 2023-09-28 DIAGNOSIS — J9601 Acute respiratory failure with hypoxia: Secondary | ICD-10-CM | POA: Diagnosis present

## 2023-09-28 DIAGNOSIS — Z66 Do not resuscitate: Secondary | ICD-10-CM | POA: Diagnosis present

## 2023-09-28 DIAGNOSIS — E872 Acidosis, unspecified: Secondary | ICD-10-CM | POA: Diagnosis present

## 2023-09-28 DIAGNOSIS — Z833 Family history of diabetes mellitus: Secondary | ICD-10-CM

## 2023-09-28 DIAGNOSIS — Z7722 Contact with and (suspected) exposure to environmental tobacco smoke (acute) (chronic): Secondary | ICD-10-CM | POA: Diagnosis present

## 2023-09-28 DIAGNOSIS — I5033 Acute on chronic diastolic (congestive) heart failure: Secondary | ICD-10-CM | POA: Diagnosis present

## 2023-09-28 DIAGNOSIS — N185 Chronic kidney disease, stage 5: Secondary | ICD-10-CM | POA: Insufficient documentation

## 2023-09-28 DIAGNOSIS — E1142 Type 2 diabetes mellitus with diabetic polyneuropathy: Secondary | ICD-10-CM | POA: Diagnosis present

## 2023-09-28 DIAGNOSIS — I169 Hypertensive crisis, unspecified: Principal | ICD-10-CM

## 2023-09-28 DIAGNOSIS — I132 Hypertensive heart and chronic kidney disease with heart failure and with stage 5 chronic kidney disease, or end stage renal disease: Principal | ICD-10-CM | POA: Diagnosis present

## 2023-09-28 DIAGNOSIS — E1122 Type 2 diabetes mellitus with diabetic chronic kidney disease: Secondary | ICD-10-CM | POA: Diagnosis present

## 2023-09-28 DIAGNOSIS — I161 Hypertensive emergency: Secondary | ICD-10-CM | POA: Diagnosis present

## 2023-09-28 DIAGNOSIS — F0390 Unspecified dementia without behavioral disturbance: Secondary | ICD-10-CM | POA: Diagnosis present

## 2023-09-28 DIAGNOSIS — Z794 Long term (current) use of insulin: Secondary | ICD-10-CM

## 2023-09-28 DIAGNOSIS — J441 Chronic obstructive pulmonary disease with (acute) exacerbation: Secondary | ICD-10-CM | POA: Diagnosis present

## 2023-09-28 DIAGNOSIS — Z8249 Family history of ischemic heart disease and other diseases of the circulatory system: Secondary | ICD-10-CM

## 2023-09-28 DIAGNOSIS — I10 Essential (primary) hypertension: Secondary | ICD-10-CM | POA: Diagnosis present

## 2023-09-28 DIAGNOSIS — Z888 Allergy status to other drugs, medicaments and biological substances status: Secondary | ICD-10-CM | POA: Diagnosis not present

## 2023-09-28 DIAGNOSIS — E785 Hyperlipidemia, unspecified: Secondary | ICD-10-CM | POA: Diagnosis present

## 2023-09-28 DIAGNOSIS — E8770 Fluid overload, unspecified: Secondary | ICD-10-CM | POA: Diagnosis present

## 2023-09-28 DIAGNOSIS — N189 Chronic kidney disease, unspecified: Secondary | ICD-10-CM | POA: Diagnosis present

## 2023-09-28 DIAGNOSIS — R0609 Other forms of dyspnea: Secondary | ICD-10-CM | POA: Diagnosis not present

## 2023-09-28 DIAGNOSIS — Z880 Allergy status to penicillin: Secondary | ICD-10-CM | POA: Diagnosis not present

## 2023-09-28 DIAGNOSIS — Z5320 Procedure and treatment not carried out because of patient's decision for unspecified reasons: Secondary | ICD-10-CM | POA: Diagnosis not present

## 2023-09-28 LAB — CBC WITH DIFFERENTIAL/PLATELET
Abs Immature Granulocytes: 0.05 10*3/uL (ref 0.00–0.07)
Basophils Absolute: 0 10*3/uL (ref 0.0–0.1)
Basophils Relative: 0 %
Eosinophils Absolute: 0 10*3/uL (ref 0.0–0.5)
Eosinophils Relative: 0 %
HCT: 31.2 % — ABNORMAL LOW (ref 36.0–46.0)
Hemoglobin: 10 g/dL — ABNORMAL LOW (ref 12.0–15.0)
Immature Granulocytes: 1 %
Lymphocytes Relative: 7 %
Lymphs Abs: 0.7 10*3/uL (ref 0.7–4.0)
MCH: 28.6 pg (ref 26.0–34.0)
MCHC: 32.1 g/dL (ref 30.0–36.0)
MCV: 89.1 fL (ref 80.0–100.0)
Monocytes Absolute: 0.5 10*3/uL (ref 0.1–1.0)
Monocytes Relative: 5 %
Neutro Abs: 8.8 10*3/uL — ABNORMAL HIGH (ref 1.7–7.7)
Neutrophils Relative %: 87 %
Platelets: 357 10*3/uL (ref 150–400)
RBC: 3.5 MIL/uL — ABNORMAL LOW (ref 3.87–5.11)
RDW: 14.4 % (ref 11.5–15.5)
WBC: 10.1 10*3/uL (ref 4.0–10.5)
nRBC: 0 % (ref 0.0–0.2)

## 2023-09-28 LAB — URINALYSIS, W/ REFLEX TO CULTURE (INFECTION SUSPECTED)
Bilirubin Urine: NEGATIVE
Glucose, UA: >=500 mg/dL — AB
Hgb urine dipstick: NEGATIVE
Ketones, ur: NEGATIVE mg/dL
Leukocytes,Ua: NEGATIVE
Nitrite: NEGATIVE
Protein, ur: >=300 mg/dL — AB
Specific Gravity, Urine: 1.014 (ref 1.005–1.030)
pH: 6 (ref 5.0–8.0)

## 2023-09-28 LAB — COMPREHENSIVE METABOLIC PANEL
ALT: 12 U/L (ref 0–44)
AST: 17 U/L (ref 15–41)
Albumin: 3.3 g/dL — ABNORMAL LOW (ref 3.5–5.0)
Alkaline Phosphatase: 54 U/L (ref 38–126)
Anion gap: 14 (ref 5–15)
BUN: 37 mg/dL — ABNORMAL HIGH (ref 8–23)
CO2: 16 mmol/L — ABNORMAL LOW (ref 22–32)
Calcium: 9 mg/dL (ref 8.9–10.3)
Chloride: 107 mmol/L (ref 98–111)
Creatinine, Ser: 4.03 mg/dL — ABNORMAL HIGH (ref 0.44–1.00)
GFR, Estimated: 11 mL/min — ABNORMAL LOW (ref >60)
Glucose, Bld: 243 mg/dL — ABNORMAL HIGH (ref 70–99)
Potassium: 3.7 mmol/L (ref 3.5–5.1)
Sodium: 137 mmol/L (ref 135–145)
Total Bilirubin: 0.5 mg/dL (ref 0.0–1.2)
Total Protein: 7.9 g/dL (ref 6.5–8.1)

## 2023-09-28 LAB — APTT: aPTT: 28 s (ref 24–36)

## 2023-09-28 LAB — RESP PANEL BY RT-PCR (RSV, FLU A&B, COVID)  RVPGX2
Influenza A by PCR: NEGATIVE
Influenza B by PCR: NEGATIVE
Resp Syncytial Virus by PCR: NEGATIVE
SARS Coronavirus 2 by RT PCR: NEGATIVE

## 2023-09-28 LAB — PROTIME-INR
INR: 1.1 (ref 0.8–1.2)
Prothrombin Time: 14.5 s (ref 11.4–15.2)

## 2023-09-28 LAB — LACTIC ACID, PLASMA: Lactic Acid, Venous: 1.4 mmol/L (ref 0.5–1.9)

## 2023-09-28 MED ORDER — LEVOFLOXACIN IN D5W 750 MG/150ML IV SOLN
750.0000 mg | Freq: Once | INTRAVENOUS | Status: AC
  Administered 2023-09-28: 750 mg via INTRAVENOUS
  Filled 2023-09-28: qty 150

## 2023-09-28 MED ORDER — ACETAMINOPHEN 325 MG PO TABS
650.0000 mg | ORAL_TABLET | Freq: Four times a day (QID) | ORAL | Status: DC | PRN
  Administered 2023-09-28 – 2023-09-29 (×2): 650 mg via ORAL
  Filled 2023-09-28 (×2): qty 2

## 2023-09-28 MED ORDER — METHYLPREDNISOLONE SODIUM SUCC 40 MG IJ SOLR
40.0000 mg | INTRAMUSCULAR | Status: DC
  Administered 2023-09-28 – 2023-09-29 (×2): 40 mg via INTRAVENOUS
  Filled 2023-09-28 (×2): qty 1

## 2023-09-28 MED ORDER — CARVEDILOL 12.5 MG PO TABS
12.5000 mg | ORAL_TABLET | Freq: Two times a day (BID) | ORAL | Status: DC
  Administered 2023-09-28 – 2023-09-30 (×4): 12.5 mg via ORAL
  Filled 2023-09-28 (×4): qty 1

## 2023-09-28 MED ORDER — HEPARIN SODIUM (PORCINE) 5000 UNIT/ML IJ SOLN
5000.0000 [IU] | Freq: Three times a day (TID) | INTRAMUSCULAR | Status: DC
  Administered 2023-09-29 – 2023-09-30 (×4): 5000 [IU] via SUBCUTANEOUS
  Filled 2023-09-28 (×4): qty 1

## 2023-09-28 MED ORDER — CHLORHEXIDINE GLUCONATE CLOTH 2 % EX PADS
6.0000 | MEDICATED_PAD | Freq: Every day | CUTANEOUS | Status: DC
  Administered 2023-09-29: 6 via TOPICAL

## 2023-09-28 MED ORDER — ASPIRIN 81 MG PO TBEC
81.0000 mg | DELAYED_RELEASE_TABLET | Freq: Every day | ORAL | Status: DC
  Administered 2023-09-29 – 2023-09-30 (×2): 81 mg via ORAL
  Filled 2023-09-28 (×2): qty 1

## 2023-09-28 MED ORDER — ACETAMINOPHEN 650 MG RE SUPP: 650.0000 mg | Freq: Four times a day (QID) | RECTAL | Status: DC | PRN

## 2023-09-28 MED ORDER — EZETIMIBE 10 MG PO TABS
10.0000 mg | ORAL_TABLET | Freq: Every day | ORAL | Status: DC
  Administered 2023-09-29 – 2023-09-30 (×2): 10 mg via ORAL
  Filled 2023-09-28 (×2): qty 1

## 2023-09-28 MED ORDER — HYDRALAZINE HCL 50 MG PO TABS
100.0000 mg | ORAL_TABLET | Freq: Three times a day (TID) | ORAL | Status: DC
  Administered 2023-09-28 – 2023-09-30 (×5): 100 mg via ORAL
  Filled 2023-09-28 (×5): qty 2

## 2023-09-28 MED ORDER — IPRATROPIUM-ALBUTEROL 0.5-2.5 (3) MG/3ML IN SOLN
3.0000 mL | RESPIRATORY_TRACT | Status: DC | PRN
  Administered 2023-09-28: 3 mL via RESPIRATORY_TRACT
  Filled 2023-09-28: qty 3

## 2023-09-28 MED ORDER — NITROGLYCERIN IN D5W 200-5 MCG/ML-% IV SOLN
5.0000 ug/min | INTRAVENOUS | Status: DC
  Administered 2023-09-28: 5 ug/min via INTRAVENOUS
  Filled 2023-09-28: qty 250

## 2023-09-28 MED ORDER — ONDANSETRON HCL 4 MG/2ML IJ SOLN: 4.0000 mg | Freq: Four times a day (QID) | INTRAMUSCULAR | Status: DC | PRN

## 2023-09-28 MED ORDER — FUROSEMIDE 10 MG/ML IJ SOLN
80.0000 mg | Freq: Two times a day (BID) | INTRAMUSCULAR | Status: DC
  Administered 2023-09-28 – 2023-09-30 (×4): 80 mg via INTRAVENOUS
  Filled 2023-09-28 (×4): qty 8

## 2023-09-28 MED ORDER — BUDESONIDE 0.25 MG/2ML IN SUSP
0.2500 mg | Freq: Two times a day (BID) | RESPIRATORY_TRACT | Status: DC
  Administered 2023-09-28 – 2023-09-30 (×4): 0.25 mg via RESPIRATORY_TRACT
  Filled 2023-09-28 (×4): qty 2

## 2023-09-28 MED ORDER — INSULIN ASPART 100 UNIT/ML IJ SOLN
0.0000 [IU] | Freq: Three times a day (TID) | INTRAMUSCULAR | Status: DC
  Administered 2023-09-29: 3 [IU] via SUBCUTANEOUS
  Administered 2023-09-29 – 2023-09-30 (×2): 5 [IU] via SUBCUTANEOUS

## 2023-09-28 MED ORDER — ISOSORBIDE MONONITRATE ER 60 MG PO TB24
60.0000 mg | ORAL_TABLET | Freq: Every day | ORAL | Status: DC
  Administered 2023-09-29 – 2023-09-30 (×2): 60 mg via ORAL
  Filled 2023-09-28 (×2): qty 1

## 2023-09-28 MED ORDER — ONDANSETRON HCL 4 MG PO TABS
4.0000 mg | ORAL_TABLET | Freq: Four times a day (QID) | ORAL | Status: AC | PRN
Start: 2023-09-28 — End: ?

## 2023-09-28 MED ORDER — IPRATROPIUM-ALBUTEROL 0.5-2.5 (3) MG/3ML IN SOLN
3.0000 mL | Freq: Four times a day (QID) | RESPIRATORY_TRACT | Status: DC
  Administered 2023-09-29 – 2023-09-30 (×5): 3 mL via RESPIRATORY_TRACT
  Filled 2023-09-28 (×5): qty 3

## 2023-09-28 MED ORDER — AMLODIPINE BESYLATE 5 MG PO TABS
10.0000 mg | ORAL_TABLET | Freq: Every day | ORAL | Status: DC
  Administered 2023-09-29 – 2023-09-30 (×2): 10 mg via ORAL
  Filled 2023-09-28 (×2): qty 2

## 2023-09-28 NOTE — ED Provider Notes (Signed)
 [place] EMERGENCY DEPARTMENT AT [facility] Provider Note   CSN: [id number] Arrival date & time: 09/28/23  1926     History  Chief Complaint  Patient presents with   Shortness of Breath   Code Sepsis    Cortne BELISSA Parrish is a 78 y.o. female.   Shortness of Breath  This patient is a very ill-appearing 78 year old female, she has a history of stage V chronic kidney disease, she has chronic anemia, she has diabetes, she presents to the hospital complaining of severe shortness of breath.  She was found to be severely hypoxic, pale and diaphoretic as well as febrile to 101 degrees by paramedics.  Evidently the patient was told that she may need to have dialysis and she has refused.  She tells me that if she was to need to be intubated she would not want that and she would not want CPR.  She is willing to try BiPAP.  Paramedics had to place her on a 15 L by nonrebreather to get her oxygen above 90.  They report that she smells of foul urine, the patient denies abdominal pain or back pain    Home Medications Prior to Admission medications   Medication Sig Start Date End Date Taking? Authorizing Provider  Accu-Chek Softclix Lancets lancets Use to check blood glucose twice daily as instructed 09/21/23   Roma Kayser, MD  Alcohol Swabs (B-D SINGLE USE SWABS REGULAR) PADS Use to check glucose twice daily as instructed 09/21/23   Roma Kayser, MD  amLODipine (NORVASC) 10 MG tablet Take 1 tablet by mouth once daily 01/01/23   Antoine Poche, MD  aspirin EC 81 MG tablet Take 81 mg by mouth daily.    [provider]  Azelastine HCl 137 MCG/SPRAY SOLN Place into both nostrils. 08/21/21   [provider]  B-D ULTRAFINE III SHORT PEN 31G X 8 MM MISC ISE 1 PEN NEEDLE ONCE DAILY AS DIRECTED 06/11/23   Nida, Denman George, MD  Blood Glucose Monitoring Suppl (ACCU-CHEK GUIDE ME) w/Device KIT Use to check blood glucose twice daily as directed. 09/21/23   Roma Kayser, MD  carvedilol (COREG) 12.5 MG tablet Take 1 tablet by mouth twice daily 04/11/23   Antoine Poche, MD  Cyanocobalamin (VITAMIN B-12 PO) Take 1 tablet by mouth daily.    [provider]  ezetimibe (ZETIA) 10 MG tablet Take 1 tablet (10 mg total) by mouth daily. 05/11/23   Strader, Lennart Pall, PA-C  ferrous sulfate 325 (65 FE) MG tablet Take 325 mg by mouth daily. Patient not taking: Reported on 05/11/2023 02/05/20   [provider]  glucose blood (ACCU-CHEK GUIDE TEST) test strip Use to check blood glucose twice daily as directed. 09/21/23   Roma Kayser, MD  hydrALAZINE (APRESOLINE) 100 MG tablet Take 1 tablet (100 mg total) by mouth 2 (two) times daily. 05/11/23   Strader, Lennart Pall, PA-C  insulin degludec (TRESIBA FLEXTOUCH) 100 UNIT/ML FlexTouch Pen Inject 24 Units into the skin at bedtime. 07/02/23   Roma Kayser, MD  isosorbide mononitrate (IMDUR) 30 MG 24 hr tablet Take 1 tablet (30 mg total) by mouth daily. Patient not taking: Reported on 07/02/2023 12/29/20   Roma Kayser, MD  Potassium Chloride ER 20 MEQ TBCR Take 0.5 tablets by mouth daily. Hold until next measurement    [provider]  sodium bicarbonate 650 MG tablet Take 650 mg by mouth daily.    [provider]  Allergies    Penicillins, Statins, and Statins support [acid blockers support]    Review of Systems   Review of Systems  Respiratory:  Positive for shortness of breath.   All other systems reviewed and are negative.   Physical Exam Updated Vital Signs There were no vitals taken for this visit. Physical Exam Vitals and nursing note reviewed.  Constitutional:      General: She is in acute distress.     Appearance: She is well-developed. She is ill-appearing and toxic-appearing.  HENT:     Head: Normocephalic and atraumatic.     Mouth/Throat:     Pharynx: No oropharyngeal exudate.  Eyes:     General: No scleral icterus.        Right eye: No discharge.        Left eye: No discharge.     Conjunctiva/sclera: Conjunctivae normal.     Pupils: Pupils are equal, round, and reactive to light.  Neck:     Thyroid: No thyromegaly.     Vascular: No JVD.  Cardiovascular:     Rate and Rhythm: Regular rhythm. Tachycardia present.     Heart sounds: Normal heart sounds. No murmur heard.    No friction rub. No gallop.  Pulmonary:     Effort: Respiratory distress present.     Breath sounds: Wheezing, rhonchi and rales present.  Abdominal:     General: Bowel sounds are normal. There is no distension.     Palpations: Abdomen is soft. There is no mass.     Tenderness: There is no abdominal tenderness.  Musculoskeletal:        General: No tenderness. Normal range of motion.     Cervical back: Normal range of motion and neck supple.     Right lower leg: No edema.     Left lower leg: No edema.  Lymphadenopathy:     Cervical: No cervical adenopathy.  Skin:    General: Skin is warm and dry.     Findings: No erythema or rash.  Neurological:     Mental Status: She is alert.     Coordination: Coordination normal.  Psychiatric:        Behavior: Behavior normal.     ED Results / Procedures / Treatments   Labs (all labs ordered are listed, but only abnormal results are displayed) Labs Reviewed  LACTIC ACID, PLASMA - Abnormal; Notable for the following components:      Result Value   Lactic Acid, Venous 2.2 (*)    All other components within normal limits  COMPREHENSIVE METABOLIC PANEL - Abnormal; Notable for the following components:   CO2 16 (*)    Glucose, Bld 243 (*)    BUN 37 (*)    Creatinine, Ser 4.03 (*)    Albumin 3.3 (*)    GFR, Estimated 11 (*)    All other components within normal limits  CBC WITH DIFFERENTIAL/PLATELET - Abnormal; Notable for the following components:   RBC 3.50 (*)    Hemoglobin 10.0 (*)    HCT 31.2 (*)    Neutro Abs 8.8 (*)    All other components within normal limits  URINALYSIS, W/  REFLEX TO CULTURE (INFECTION SUSPECTED) - Abnormal; Notable for the following components:   APPearance HAZY (*)    Glucose, UA >=500 (*)    Protein, ur >=300 (*)    Bacteria, UA RARE (*)    All other components within normal limits  RESP PANEL BY RT-PCR (RSV, FLU A&B, COVID)  RVPGX2  CULTURE, BLOOD (ROUTINE X 2)  CULTURE, BLOOD (ROUTINE X 2)  MRSA NEXT GEN BY PCR, NASAL  RESPIRATORY PANEL BY PCR  LACTIC ACID, PLASMA  PROTIME-INR  APTT  BASIC METABOLIC PANEL  CBC  HEMOGLOBIN A1C    EKG EKG Interpretation Date/Time:  Friday September 28 2023 19:42:43 EDT Ventricular Rate:  104 PR Interval:  175 QRS Duration:  143 QT Interval:  365 QTC Calculation: 481 R Axis:   21  Text Interpretation: Sinus tachycardia Right atrial enlargement Probable left ventricular hypertrophy Confirmed by Eber Hong ([id number]) on 09/28/2023 7:44:44 PM  Radiology DG Chest Port 1 View Result Date: 09/28/2023 CLINICAL DATA:  Shortness of breath, sepsis EXAM: PORTABLE CHEST 1 VIEW COMPARISON:  09/13/2019 FINDINGS: Heart and mediastinal contours within normal limits. Patchy bilateral airspace opacities. Trace left pleural effusion. No acute bony abnormality. IMPRESSION: Patchy bilateral airspace disease could reflect edema or infection. Electronically Signed   By: Charlett Nose M.D.   On: 09/28/2023 21:11    Procedures .Critical Care  Performed by: Eber Hong, MD Authorized by: Eber Hong, MD   Critical care provider statement:    Critical care time (minutes):  45   Critical care time was exclusive of:  Separately billable procedures and treating other patients and teaching time   Critical care was necessary to treat or prevent imminent or life-threatening deterioration of the following conditions:  Respiratory failure and sepsis   Critical care was time spent personally by me on the following activities:  Development of treatment plan with patient or surrogate, discussions with consultants, evaluation of  patient's response to treatment, examination of patient, obtaining history from patient or surrogate, review of old charts, re-evaluation of patient's condition, pulse oximetry, ordering and review of radiographic studies, ordering and review of laboratory studies and ordering and performing treatments and interventions   I assumed direction of critical care for this patient from another provider in my specialty: no     Care discussed with: admitting provider   Comments:           Medications Ordered in ED Medications  levofloxacin (LEVAQUIN) IVPB 750 mg (has no administration in time range)    ED Course/ Medical Decision Making/ A&P                                 Medical Decision Making Amount and/or Complexity of Data Reviewed Labs: ordered. Radiology: ordered.  Risk Prescription drug management. Decision regarding hospitalization.    This patient presents to the ED for concern of severe shortness of breath, this involves an extensive number of treatment options, and is a complaint that carries with it a high risk of complications and morbidity.  The differential diagnosis includes fulminant edema, hyperkalemia, pneumonia, sepsis, could have a UTI or pyelonephritis, severe anemia   Co morbidities that complicate the patient evaluation  Renal failure progressive, diabetes   Additional history obtained:  Additional history obtained from the record External records from outside source obtained and reviewed including visits with inpatient doctors and outpatient clinics Echocardiogram has been since 2022, at that time that showed ejection fraction of 55 to 60% with LVH and grade 1 diastolic dysfunction   Lab Tests:  I Ordered, and personally interpreted labs.  The pertinent results include: Negative for COVID and flu, lactic acid is elevated 2.2, metabolic panel with chronic renal failure with a creatinine of 4, this is unchanged from baseline, CBC without  leukocytosis,  urine with protein but no signs of infection   Imaging Studies ordered:  I ordered imaging studies including chest x-ray I independently visualized and interpreted imaging which showed patchy bilateral lung disease could be consistent with infection or edema I agree with the radiologist interpretation   Cardiac Monitoring: / EKG:  The patient was maintained on a cardiac monitor.  I personally viewed and interpreted the cardiac monitored which showed an underlying rhythm of: Sinus tachycardia with severe hypertension   Consultations Obtained:  I requested consultation with the hospitalist,  and discussed lab and imaging findings as well as pertinent plan - they recommend: Admission to higher level of care   Problem List / ED Course / Critical interventions / Medication management  This patient required BiPAP, she required nitroglycerin drip, she was severely hypertensive and actually has a temperature of 99.9, she was covered with antibiotics however it is unclear whether this is pneumonia or hypertensive crisis.  Either way she is critically ill and needing to be admitted to the hospital. I ordered medication including nitroglycerin drip and levofloxacin for respiratory distress and severe hypertension Reevaluation of the patient after these medicines showed that the patient critically ill but improving on BiPAP I have reviewed the patients home medicines and have made adjustments as needed   Social Determinants of Health:  Renal failure   Test / Admission - Considered:  Admit to higher level of care         Final Clinical Impression(s) / ED Diagnoses Final diagnoses:  None    Rx / DC Orders ED Discharge Orders     None         Eber Hong, MD 09/28/23 2305

## 2023-09-28 NOTE — ED Triage Notes (Signed)
 Pt via [facility] from home c/o SOB and code sepsis. Pt has kidney failure and refuses dialysis. Pt on NRB 15 L at 91%.  20 L AC, LR given  222/118 104 HR T 101

## 2023-09-28 NOTE — Progress Notes (Signed)
 Patient transported from ED 1 to ICU room 3 on BIPAP with no complications.

## 2023-09-28 NOTE — Assessment & Plan Note (Signed)
No confusion or agitation.

## 2023-09-28 NOTE — ED Notes (Signed)
 ED TO INPATIENT HANDOFF REPORT  ED Nurse Name and Phone #: [phone]  S Name/Age/Gender Maria Parrish 78 y.o. female Room/Bed: APA01/APA01  Code Status   Code Status: Do not attempt resuscitation (DNR) PRE-ARREST INTERVENTIONS DESIRED  Home/SNF/Other Home Patient oriented to: self, place, time, and situation Is this baseline? Yes   Triage Complete: Triage complete  Chief Complaint Volume overload [E87.70]  Triage Note Pt via [facility] from home c/o SOB and code sepsis. Pt has kidney failure and refuses dialysis. Pt on NRB 15 L at 91%.  20 L AC, LR given  222/118 104 HR T 101   Allergies Allergies  Allergen Reactions   Penicillins Other (See Comments)    Makes patient sore.  Has patient had a PCN reaction causing immediate rash, facial/tongue/throat swelling, SOB or lightheadedness with hypotension: No Has patient had a PCN reaction causing severe rash involving mucus membranes or skin necrosis: No Has patient had a PCN reaction that required hospitalization No Has patient had a PCN reaction occurring within the last 10 years: No If all of the above answers are "NO", then may proceed with Cephalosporin use.    Statins Other (See Comments)    Makes wrists and fingers ache Other reaction(s): Muscle Pain   Statins Support [Acid Blockers Support]     Other reaction(s): joint pain    Level of Care/Admitting Diagnosis ED Disposition     ED Disposition  Admit   Condition  --   Comment  Hospital [facility]: [facility] [[id number]]  Level of Care: Stepdown [14]  Covid Evaluation: Asymptomatic - no recent exposure (last 10 days) testing not required  Diagnosis: Volume overload [244010]  Admitting Physician: Coralie Keens [[id number]]  Attending Physician: Coralie Keens [[id number]]  Certification:: I certify this patient will need inpatient services for at least 2 midnights  Expected Medical Readiness: 10/02/2023          B Medical/Surgery  History Past Medical History:  Diagnosis Date   Assistance needed for mobility    walks with walker   Confusion    Dementia (HCC)    Diabetes mellitus without complication (HCC)    Generalized weakness    Heart failure with improved ejection fraction (HFimpEF) (HCC)    a. EF 40-45% by echo in 04/2020 b. normalized to 55-60% by echo in 06/2021   Hypercalcemia    Hyperlipidemia    Hyperparathyroidism (HCC)    Hypertension    Hypertension    Moderate protein-calorie malnutrition (HCC)    Peripheral neuropathy    UTI (lower urinary tract infection)    Past Surgical History:  Procedure Laterality Date   COLONOSCOPY N/A 08/12/2015   Procedure: COLONOSCOPY;  Surgeon: Corbin Ade, MD;  Location: [facility];  Service: Endoscopy;  Laterality: N/A;  1245pm   ESOPHAGOGASTRODUODENOSCOPY N/A 08/12/2015   Procedure: ESOPHAGOGASTRODUODENOSCOPY (EGD);  Surgeon: Corbin Ade, MD;  Location: [facility];  Service: Endoscopy;  Laterality: N/A;   none       A IV Location/Drains/Wounds Patient Lines/Drains/Airways Status     Active Line/Drains/Airways     Name Placement date Placement time Site Days   Peripheral IV 09/28/23 20 G Left Antecubital 09/28/23  1945  Antecubital  less than 1   Peripheral IV 09/28/23 20 G Right Antecubital 09/28/23  1946  Antecubital  less than 1   Peripheral IV 09/28/23 20 G Anterior;Right Forearm 09/28/23  1946  Forearm  less than 1  Intake/Output Last 24 hours  Intake/Output Summary (Last 24 hours) at 09/28/2023 2110 Last data filed at 09/28/2023 2035 Gross per 24 hour  Intake 3.9 ml  Output --  Net 3.9 ml    Labs/Imaging Results for orders placed or performed during the hospital encounter of 09/28/23 (from the past 48 hours)  Resp panel by RT-PCR (RSV, Flu A&B, Covid) Anterior Nasal Swab     Status: None   Collection Time: 09/28/23  7:39 PM   Specimen: Anterior Nasal Swab  Result Value Ref Range   SARS Coronavirus 2 by RT PCR  NEGATIVE NEGATIVE    Comment: (NOTE) SARS-CoV-2 target nucleic acids are NOT DETECTED.  The SARS-CoV-2 RNA is generally detectable in upper respiratory specimens during the acute phase of infection. The lowest concentration of SARS-CoV-2 viral copies this assay can detect is 138 copies/mL. A negative result does not preclude SARS-Cov-2 infection and should not be used as the sole basis for treatment or other patient management decisions. A negative result may occur with  improper specimen collection/handling, submission of specimen other than nasopharyngeal swab, presence of viral mutation(s) within the areas targeted by this assay, and inadequate number of viral copies(<138 copies/mL). A negative result must be combined with clinical observations, patient history, and epidemiological information. The expected result is Negative.  Fact Sheet for Patients:  BloggerCourse.com  Fact Sheet for Healthcare Providers:  SeriousBroker.it  This test is no t yet approved or cleared by the [place] FDA and  has been authorized for detection and/or diagnosis of SARS-CoV-2 by FDA under an Emergency Use Authorization (EUA). This EUA will remain  in effect (meaning this test can be used) for the duration of the COVID-19 declaration under Section 564(b)(1) of the Act, 21 U.S.C.section 360bbb-3(b)(1), unless the authorization is terminated  or revoked sooner.       Influenza A by PCR NEGATIVE NEGATIVE   Influenza B by PCR NEGATIVE NEGATIVE    Comment: (NOTE) The Xpert Xpress SARS-CoV-2/FLU/RSV plus assay is intended as an aid in the diagnosis of influenza from Nasopharyngeal swab specimens and should not be used as a sole basis for treatment. Nasal washings and aspirates are unacceptable for Xpert Xpress SARS-CoV-2/FLU/RSV testing.  Fact Sheet for Patients: BloggerCourse.com  Fact Sheet for Healthcare  Providers: SeriousBroker.it  This test is not yet approved or cleared by the [place] FDA and has been authorized for detection and/or diagnosis of SARS-CoV-2 by FDA under an Emergency Use Authorization (EUA). This EUA will remain in effect (meaning this test can be used) for the duration of the COVID-19 declaration under Section 564(b)(1) of the Act, 21 U.S.C. section 360bbb-3(b)(1), unless the authorization is terminated or revoked.     Resp Syncytial Virus by PCR NEGATIVE NEGATIVE    Comment: (NOTE) Fact Sheet for Patients: BloggerCourse.com  Fact Sheet for Healthcare Providers: SeriousBroker.it  This test is not yet approved or cleared by the [place] FDA and has been authorized for detection and/or diagnosis of SARS-CoV-2 by FDA under an Emergency Use Authorization (EUA). This EUA will remain in effect (meaning this test can be used) for the duration of the COVID-19 declaration under Section 564(b)(1) of the Act, 21 U.S.C. section 360bbb-3(b)(1), unless the authorization is terminated or revoked.  Performed at [facility], [address], [place], [place] [address]   Lactic acid, plasma     Status: Abnormal   Collection Time: 09/28/23  7:39 PM  Result Value Ref Range   Lactic Acid, Venous 2.2 (HH) 0.5 -  1.9 mmol/L    Comment: CRITICAL RESULT CALLED TO, READ BACK BY AND VERIFIED WITH Disney Ruggiero,R ON 09/28/23 AT LOY,C Performed at [facility], [address] [place], [place] [address]   Comprehensive metabolic panel     Status: Abnormal   Collection Time: 09/28/23  7:39 PM  Result Value Ref Range   Sodium 137 135 - 145 mmol/L   Potassium 3.7 3.5 - 5.1 mmol/L   Chloride 107 98 - 111 mmol/L   CO2 16 (L) 22 - 32 mmol/L   Glucose, Bld 243 (H) 70 - 99 mg/dL    Comment: Glucose reference range applies only to samples taken after fasting for at least 8 hours.   BUN 37 (H) 8 - 23 mg/dL    Creatinine, Ser 1.91 (H) 0.44 - 1.00 mg/dL   Calcium 9.0 8.9 - 47.8 mg/dL   Total Protein 7.9 6.5 - 8.1 g/dL   Albumin 3.3 (L) 3.5 - 5.0 g/dL   AST 17 15 - 41 U/L   ALT 12 0 - 44 U/L   Alkaline Phosphatase 54 38 - 126 U/L   Total Bilirubin 0.5 0.0 - 1.2 mg/dL   GFR, Estimated 11 (L) >60 mL/min    Comment: (NOTE) Calculated using the CKD-EPI Creatinine Equation (2021)    Anion gap 14 5 - 15    Comment: Performed at [facility], [address] [place], [place] [address]  CBC with Differential     Status: Abnormal   Collection Time: 09/28/23  7:39 PM  Result Value Ref Range   WBC 10.1 4.0 - 10.5 K/uL   RBC 3.50 (L) 3.87 - 5.11 MIL/uL   Hemoglobin 10.0 (L) 12.0 - 15.0 g/dL   HCT 13.0 (L) 86.5 - 78.4 %   MCV 89.1 80.0 - 100.0 fL   MCH 28.6 26.0 - 34.0 pg   MCHC 32.1 30.0 - 36.0 g/dL   RDW 69.6 29.5 - 28.4 %   Platelets 357 150 - 400 K/uL   nRBC 0.0 0.0 - 0.2 %   Neutrophils Relative % 87 %   Neutro Abs 8.8 (H) 1.7 - 7.7 K/uL   Lymphocytes Relative 7 %   Lymphs Abs 0.7 0.7 - 4.0 K/uL   Monocytes Relative 5 %   Monocytes Absolute 0.5 0.1 - 1.0 K/uL   Eosinophils Relative 0 %   Eosinophils Absolute 0.0 0.0 - 0.5 K/uL   Basophils Relative 0 %   Basophils Absolute 0.0 0.0 - 0.1 K/uL   Immature Granulocytes 1 %   Abs Immature Granulocytes 0.05 0.00 - 0.07 K/uL    Comment: Performed at [facility], [address], [place], [place] [address]  Protime-INR     Status: None   Collection Time: 09/28/23  7:39 PM  Result Value Ref Range   Prothrombin Time 14.5 11.4 - 15.2 seconds   INR 1.1 0.8 - 1.2    Comment: (NOTE) INR goal varies based on device and disease states. Performed at [facility], [address], [place], [place] [address]   APTT     Status: None   Collection Time: 09/28/23  7:39 PM  Result Value Ref Range   aPTT 28 24 - 36 seconds    Comment: Performed at [facility], [address], [place], [place] [address]  Urinalysis, w/ Reflex to Culture (Infection Suspected)  -Urine, Catheterized     Status: Abnormal   Collection Time: 09/28/23  7:39 PM  Result Value Ref Range   Specimen Source URINE, CATHETERIZED  Color, Urine YELLOW YELLOW   APPearance HAZY (A) CLEAR   Specific Gravity, Urine 1.014 1.005 - 1.030   pH 6.0 5.0 - 8.0   Glucose, UA >=500 (A) NEGATIVE mg/dL   Hgb urine dipstick NEGATIVE NEGATIVE   Bilirubin Urine NEGATIVE NEGATIVE   Ketones, ur NEGATIVE NEGATIVE mg/dL   Protein, ur >=161 (A) NEGATIVE mg/dL   Nitrite NEGATIVE NEGATIVE   Leukocytes,Ua NEGATIVE NEGATIVE   RBC / HPF 0-5 0 - 5 RBC/hpf   WBC, UA 0-5 0 - 5 WBC/hpf    Comment:        Reflex urine culture not performed if WBC <=10, OR if Squamous epithelial cells >5. If Squamous epithelial cells >5 suggest recollection.    Bacteria, UA RARE (A) NONE SEEN   Squamous Epithelial / HPF 0-5 0 - 5 /HPF    Comment: Performed at [facility], [address], [place], [place] [address]   No results found.  Pending Labs Unresulted Labs (From admission, onward)     Start     Ordered   09/28/23 1932  Lactic acid, plasma  (Septic presentation on arrival (screening labs, nursing and treatment orders for obvious sepsis))  STAT Now then every 2 hours,   R (with STAT occurrences)      09/28/23 1932   09/28/23 1932  Blood Culture (routine x 2)  (Septic presentation on arrival (screening labs, nursing and treatment orders for obvious sepsis))  BLOOD CULTURE X 2,   STAT      09/28/23 1932   Signed and Held  CBC  (heparin)  Once,   R       Comments: Baseline for heparin therapy IF NOT ALREADY DRAWN.  Notify MD if PLT < 100 K.    Signed and Held   Signed and Held  Creatinine, serum  (heparin)  Once,   R       Comments: Baseline for heparin therapy IF NOT ALREADY DRAWN.    Signed and Held   Signed and Held  Basic metabolic panel  Tomorrow morning,   R        Signed and Held   Signed and Held  CBC  Tomorrow morning,   R        Signed and Held            Vitals/Pain Today's Vitals    09/28/23 2045 09/28/23 2050 09/28/23 2055 09/28/23 2100  BP: (!) 194/141 (!) 162/91 (!) 174/80 (!) 165/76  Pulse: 93 88 87 87  Resp: (!) 23 (!) 27 (!) 23 (!) 26  Temp:      TempSrc:      SpO2: 99% 100% 100% 100%    Isolation Precautions No active isolations  Medications Medications  levofloxacin (LEVAQUIN) IVPB 750 mg (750 mg Intravenous New Bag/Given 09/28/23 2004)  nitroGLYCERIN 50 mg in dextrose 5 % 250 mL (0.2 mg/mL) infusion (15 mcg/min Intravenous Infusion Verify 09/28/23 2035)    Mobility walks with device     Focused Assessments Renal Assessment Handoff:  Hemodialysis Schedule:  Last Hemodialysis date and time:    Restricted appendage:     R Recommendations: See Admitting Provider Note  Report given to:   Additional Notes:  On BiPAP. Refuses dialysis. A&O x4. Antibiotics and on nitro.

## 2023-09-28 NOTE — Assessment & Plan Note (Signed)
 Will place patient in insulin sliding scale sensitive.  Close follow up on capillary glucose.

## 2023-09-28 NOTE — H&P (Addendum)
 History and Physical    Patient: Maria Parrish UJW:[id number] DOB: [birth year] DOA: 09/28/2023 DOS: the patient was seen and examined on 09/28/2023 PCP: Ardath Sax, FNP  Patient coming from: Home  Chief Complaint:  Chief Complaint  Patient presents with   Shortness of Breath   Code Sepsis   HPI: Maria Parrish is a 78 y.o. female with medical history significant of dementia, heart failure, hyperlipidemia, hypertension, and T2DM who presented with dyspnea.   Patient reports 2 days of progressive dyspnea, with wheezing and occasional cough, no PND, orthopnea or lower extremity edema.  Denies any fevers or chills, no nausea, vomiting or diarrhea.  She uses a walker for ambulation and lives with her son's family. Apparently her grandson had a viral illness a few days ago.  She denies tobacco abuse but reported being exposed to second hand smoke.   Today EMS was called and she was transported to the ED under code sepsis. In the ED she was found in respiratory distress and placed on Bipap.   Limited history due to patient being on full face mask non invasive ventilation.   Review of Systems: As mentioned in the history of present illness. All other systems reviewed and are negative. Past Medical History:  Diagnosis Date   Assistance needed for mobility    walks with walker   Confusion    Dementia (HCC)    Diabetes mellitus without complication (HCC)    Generalized weakness    Heart failure with improved ejection fraction (HFimpEF) (HCC)    a. EF 40-45% by echo in 04/2020 b. normalized to 55-60% by echo in 06/2021   Hypercalcemia    Hyperlipidemia    Hyperparathyroidism (HCC)    Hypertension    Hypertension    Moderate protein-calorie malnutrition (HCC)    Peripheral neuropathy    UTI (lower urinary tract infection)    Past Surgical History:  Procedure Laterality Date   COLONOSCOPY N/A 08/12/2015   Procedure: COLONOSCOPY;  Surgeon: Corbin Ade, MD;  Location: [facility];   Service: Endoscopy;  Laterality: N/A;  1245pm   ESOPHAGOGASTRODUODENOSCOPY N/A 08/12/2015   Procedure: ESOPHAGOGASTRODUODENOSCOPY (EGD);  Surgeon: Corbin Ade, MD;  Location: [facility];  Service: Endoscopy;  Laterality: N/A;   none     Social History:  reports that she has never smoked. She has never used smokeless tobacco. She reports that she does not drink alcohol and does not use drugs.  Allergies  Allergen Reactions   Penicillins Other (See Comments)    Makes patient sore.  Has patient had a PCN reaction causing immediate rash, facial/tongue/throat swelling, SOB or lightheadedness with hypotension: No Has patient had a PCN reaction causing severe rash involving mucus membranes or skin necrosis: No Has patient had a PCN reaction that required hospitalization No Has patient had a PCN reaction occurring within the last 10 years: No If all of the above answers are "NO", then may proceed with Cephalosporin use.    Statins Other (See Comments)    Makes wrists and fingers ache Other reaction(s): Muscle Pain   Statins Support [Acid Blockers Support]     Other reaction(s): joint pain    Family History  Problem Relation Age of Onset   Heart attack Mother    Diabetes Father    Hypertension Brother    Hypertension Brother    Diabetes Brother    Colon cancer Neg Hx     Prior to Admission medications   Medication Sig Start  Date End Date Taking? Authorizing Provider  Accu-Chek Softclix Lancets lancets Use to check blood glucose twice daily as instructed 09/21/23   Roma Kayser, MD  Alcohol Swabs (B-D SINGLE USE SWABS REGULAR) PADS Use to check glucose twice daily as instructed 09/21/23   Roma Kayser, MD  amLODipine (NORVASC) 10 MG tablet Take 1 tablet by mouth once daily 01/01/23   Antoine Poche, MD  aspirin EC 81 MG tablet Take 81 mg by mouth daily.    [provider]  Azelastine HCl 137 MCG/SPRAY SOLN Place into both nostrils. 08/21/21   [provider]  B-D ULTRAFINE III SHORT PEN 31G X 8 MM MISC ISE 1 PEN NEEDLE ONCE DAILY AS DIRECTED 06/11/23   Nida, Denman George, MD  Blood Glucose Monitoring Suppl (ACCU-CHEK GUIDE ME) w/Device KIT Use to check blood glucose twice daily as directed. 09/21/23   Roma Kayser, MD  carvedilol (COREG) 12.5 MG tablet Take 1 tablet by mouth twice daily 04/11/23   Antoine Poche, MD  Cyanocobalamin (VITAMIN B-12 PO) Take 1 tablet by mouth daily.    [provider]  ezetimibe (ZETIA) 10 MG tablet Take 1 tablet (10 mg total) by mouth daily. 05/11/23   Strader, Lennart Pall, PA-C  ferrous sulfate 325 (65 FE) MG tablet Take 325 mg by mouth daily. Patient not taking: Reported on 05/11/2023 02/05/20   [provider]  glucose blood (ACCU-CHEK GUIDE TEST) test strip Use to check blood glucose twice daily as directed. 09/21/23   Roma Kayser, MD  hydrALAZINE (APRESOLINE) 100 MG tablet Take 1 tablet (100 mg total) by mouth 2 (two) times daily. 05/11/23   Strader, Lennart Pall, PA-C  insulin degludec (TRESIBA FLEXTOUCH) 100 UNIT/ML FlexTouch Pen Inject 24 Units into the skin at bedtime. 07/02/23   Roma Kayser, MD  isosorbide mononitrate (IMDUR) 30 MG 24 hr tablet Take 1 tablet (30 mg total) by mouth daily. Patient not taking: Reported on 07/02/2023 12/29/20   Roma Kayser, MD  Potassium Chloride ER 20 MEQ TBCR Take 0.5 tablets by mouth daily. Hold until next measurement    [provider]  sodium bicarbonate 650 MG tablet Take 650 mg by mouth daily.    [provider]    Physical Exam: Vitals:   09/28/23 2028 09/28/23 2030 09/28/23 2033 09/28/23 2036  BP: (!) 187/95 (!) 149/114 (!) 201/94   Pulse: (!) 106 96 96 96  Resp: (!) 29 (!) 28 (!) 28 (!) 27  Temp:      TempSrc:      SpO2: 98% 100% 100% 100%   Neurology awake and alert, deconditioned ENT mild pallor, full face mask bipap in place Cardiovascular with S1 and S2 present and  tachycardic with no gallops, rubs or murmurs Respiratory with positive bilateral wheezing and rhonchi, inspiratory and expiratory, with no significant rales Abdomen with no distention, non tender   No lower extremity edema  Data Reviewed:   Na 137, K 3,7 Cl 107 bicarbonate 16, glucose 243, BUN 37, cr 4,0  Lactic acid 2,2  Wbc 10,1 hgb 10,0 plt 357  Urine analysis SG 1,014, protein > 300, glucose > 500, negative leukocytes and negative hgb.   Chest radiograph with hyperinflation, with mild cardiomegaly, fluid in the right fissure with mild interstitial infiltrates in the left upper, left lower and right lower lobe.   EKG 104 bpm, normal axis, normal intervals, qtc 481, sinus rhythm with right atrial enlargement, with no significant ST  segment or T wave changes.   Assessment and Plan: * Acute hypoxemic respiratory failure (HCC) Patient has clinical signs of air trapping possible undiagnosed COPD, plus features of pulmonary edema.   Plan to continue Bipap for respiratory support.  Continue diuresis to target negative volume status.  Will add bronchodilator therapy and inhaled corticosteroids.  Systemic corticosteroids with methylprednisolone.  Check respiratory viral panel.   Acute on chronic diastolic congestive heart failure (HCC) 2022 echocardiogram with preserved LV systolic function with EF 55 to 60%, moderate left ventricular hypertrophy, RV systolic function preserved. No significant valvular disease. RVSP 27,6 mmHg.   Patient not on diuretic therapy at home.  Will continue diuresis with furosemide 80 mg IV bid.  Follow up urine output.  Blood pressure control with hydralazine and isosorbide for after load reduction.  Continue with carvedilol.  Check new echocardiogram.   Essential hypertension, benign Uncontrolled hypertension with hypertensive emergency.   Patient was placed transitory on nitroglycerin drip in the ED.  Will continue blood pressure control with  hydralazine and isosorbide Carvedilol and amlodipine.  Diuresis with furosemide.  Continue blood pressure monitoring.   Chronic kidney disease With a baseline serum cr of 4 her calculated GFR is 11 consistent with stage 5  She does have mild anion gap metabolic acidosis, due to lactic acid elevation, no hyperkalemia.  She has expressed not willing renal replacement therapy.   Will continue aggressive diuresis with furosemide 80 mg IV bid Follow up renal function and electrolytes.  Urine output monitoring.    Dementia (HCC) No confusion or agitation.   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  Close follow up on capillary glucose.       Advance Care Planning:   Code Status: Do not attempt resuscitation (DNR) PRE-ARREST INTERVENTIONS DESIRED   Consults: none   Family Communication: no family at the bedside   Severity of Illness: The appropriate patient status for this patient is INPATIENT. Inpatient status is judged to be reasonable and necessary in order to provide the required intensity of service to ensure the patient's safety. The patient's presenting symptoms, physical exam findings, and initial radiographic and laboratory data in the context of their chronic comorbidities is felt to place them at high risk for further clinical deterioration. Furthermore, it is not anticipated that the patient will be medically stable for discharge from the hospital within 2 midnights of admission.   * I certify that at the point of admission it is my clinical judgment that the patient will require inpatient hospital care spanning beyond 2 midnights from the point of admission due to high intensity of service, high risk for further deterioration and high frequency of surveillance required.*  Author: Coralie Keens, MD 09/28/2023 8:38 PM  For on call review [email or web address].

## 2023-09-28 NOTE — ED Notes (Addendum)
 Antibiotics delayed due to having no channels avaible anywhere in the department.

## 2023-09-28 NOTE — ED Notes (Signed)
 Date and time results received: 09/28/23 2051   Test: Lactic Critical Value: 2.2  Name of Provider Notified: Hyacinth Meeker  Orders Received? Or Actions Taken?: NA

## 2023-09-28 NOTE — Assessment & Plan Note (Addendum)
 2022 echocardiogram with preserved LV systolic function with EF 55 to 60%, moderate left ventricular hypertrophy, RV systolic function preserved. No significant valvular disease. RVSP 27,6 mmHg.   Patient not on diuretic therapy at home.  Will continue diuresis with furosemide 80 mg IV bid.  Follow up urine output.  Blood pressure control with hydralazine and isosorbide for after load reduction.  Continue with carvedilol.  Check new echocardiogram.

## 2023-09-28 NOTE — Assessment & Plan Note (Signed)
 With a baseline serum cr of 4 her calculated GFR is 11 consistent with stage 5  She does have mild anion gap metabolic acidosis, due to lactic acid elevation, no hyperkalemia.  She has expressed not willing renal replacement therapy.   Will continue aggressive diuresis with furosemide 80 mg IV bid Follow up renal function and electrolytes.  Urine output monitoring.

## 2023-09-28 NOTE — Assessment & Plan Note (Signed)
 Patient has clinical signs of air trapping possible undiagnosed COPD, plus features of pulmonary edema.   Plan to continue Bipap for respiratory support.  Continue diuresis to target negative volume status.  Will add bronchodilator therapy and inhaled corticosteroids.  Systemic corticosteroids with methylprednisolone.  Check respiratory viral panel.

## 2023-09-28 NOTE — Assessment & Plan Note (Addendum)
 Uncontrolled hypertension with hypertensive emergency.   Patient was placed transitory on nitroglycerin drip in the ED.  Will continue blood pressure control with hydralazine and isosorbide Carvedilol and amlodipine.  Diuresis with furosemide.  Continue blood pressure monitoring.

## 2023-09-28 NOTE — Progress Notes (Signed)
 CODE SEPSIS - PHARMACY COMMUNICATION  **Broad Spectrum Antibiotics should be administered within 1 hour of Sepsis diagnosis**  Time Code Sepsis Called/Page Received: 1932  Antibiotics Ordered: levofloxacin  Time of 1st antibiotic administration: 2004  Additional action taken by pharmacy: Messaged nurse  If necessary, Name of Provider/Nurse Contacted: Elmer Sow, RN    Merryl Hacker ,PharmD Clinical Pharmacist  09/28/2023  7:33 PM

## 2023-09-29 ENCOUNTER — Inpatient Hospital Stay (HOSPITAL_COMMUNITY)

## 2023-09-29 ENCOUNTER — Other Ambulatory Visit (HOSPITAL_COMMUNITY): Payer: Self-pay | Admitting: *Deleted

## 2023-09-29 DIAGNOSIS — R0609 Other forms of dyspnea: Secondary | ICD-10-CM | POA: Diagnosis not present

## 2023-09-29 DIAGNOSIS — F0390 Unspecified dementia without behavioral disturbance: Secondary | ICD-10-CM | POA: Diagnosis not present

## 2023-09-29 DIAGNOSIS — I10 Essential (primary) hypertension: Secondary | ICD-10-CM | POA: Diagnosis not present

## 2023-09-29 DIAGNOSIS — N185 Chronic kidney disease, stage 5: Secondary | ICD-10-CM | POA: Diagnosis not present

## 2023-09-29 DIAGNOSIS — J9601 Acute respiratory failure with hypoxia: Secondary | ICD-10-CM | POA: Diagnosis not present

## 2023-09-29 LAB — BASIC METABOLIC PANEL
Anion gap: 10 (ref 5–15)
BUN: 42 mg/dL — ABNORMAL HIGH (ref 8–23)
CO2: 17 mmol/L — ABNORMAL LOW (ref 22–32)
Calcium: 8.4 mg/dL — ABNORMAL LOW (ref 8.9–10.3)
Chloride: 108 mmol/L (ref 98–111)
Creatinine, Ser: 4.06 mg/dL — ABNORMAL HIGH (ref 0.44–1.00)
GFR, Estimated: 11 mL/min — ABNORMAL LOW (ref >60)
Glucose, Bld: 233 mg/dL — ABNORMAL HIGH (ref 70–99)
Potassium: 3.7 mmol/L (ref 3.5–5.1)
Sodium: 135 mmol/L (ref 135–145)

## 2023-09-29 LAB — CBC
HCT: 23.8 % — ABNORMAL LOW (ref 36.0–46.0)
Hemoglobin: 7.7 g/dL — ABNORMAL LOW (ref 12.0–15.0)
MCH: 28.3 pg (ref 26.0–34.0)
MCHC: 32.4 g/dL (ref 30.0–36.0)
MCV: 87.5 fL (ref 80.0–100.0)
Platelets: 250 10*3/uL (ref 150–400)
RBC: 2.72 MIL/uL — ABNORMAL LOW (ref 3.87–5.11)
RDW: 14 % (ref 11.5–15.5)
WBC: 5.7 10*3/uL (ref 4.0–10.5)
nRBC: 0 % (ref 0.0–0.2)

## 2023-09-29 LAB — ECHOCARDIOGRAM COMPLETE
Area-P 1/2: 5.02 cm2
Height: 65 in
MV M vel: 5.06 m/s
MV Peak grad: 102.4 mmHg
S' Lateral: 3.6 cm
Weight: 2282.2 [oz_av]

## 2023-09-29 LAB — GLUCOSE, CAPILLARY
Glucose-Capillary: 273 mg/dL — ABNORMAL HIGH (ref 70–99)
Glucose-Capillary: 205 mg/dL — ABNORMAL HIGH (ref 70–99)
Glucose-Capillary: 120 mg/dL — ABNORMAL HIGH (ref 70–99)
Glucose-Capillary: 147 mg/dL — ABNORMAL HIGH (ref 70–99)

## 2023-09-29 LAB — MRSA NEXT GEN BY PCR, NASAL: MRSA by PCR Next Gen: NOT DETECTED

## 2023-09-29 LAB — HEMOGLOBIN A1C
Hgb A1c MFr Bld: 6.9 % — ABNORMAL HIGH (ref 4.8–5.6)
Mean Plasma Glucose: 151.33 mg/dL

## 2023-09-29 NOTE — Plan of Care (Signed)
  Problem: Education: Goal: Knowledge of General Education information will improve Description: Including pain rating scale, medication(s)/side effects and non-pharmacologic comfort measures Outcome: Progressing   Problem: Clinical Measurements: Goal: Respiratory complications will improve Outcome: Progressing   Problem: Coping: Goal: Level of anxiety will decrease Outcome: Progressing   Problem: Elimination: Goal: Will not experience complications related to urinary retention Outcome: Progressing   Problem: Pain Managment: Goal: General experience of comfort will improve and/or be controlled Outcome: Progressing

## 2023-09-29 NOTE — Progress Notes (Signed)
 Patient has done well on BiPAP  down to 40 % FiO2

## 2023-09-29 NOTE — Progress Notes (Signed)
*  PRELIMINARY RESULTS* Echocardiogram 2D Echocardiogram has been performed.  Maria Parrish 09/29/2023, 1:22 PM

## 2023-09-29 NOTE — TOC Progression Note (Signed)
 Transition of Care [facility]) - Progression Note    Patient Details  Name: Maria Parrish MRN: [id number] Date of Birth: [birth year]  [facility] CM/SW Contact  Catalina Gravel, [place] Phone Number: 09/29/2023, 4:52 PM  Clinical Narrative:     CSW contacted pt son to collect information regarding home and DC planning needs, left text message as well. TOC following.    Barriers to Discharge: Continued Medical Work up  Expected Discharge Plan and Services                                               Social Determinants of Health (SDOH) Interventions SDOH Screenings   Food Insecurity: No Food Insecurity (09/29/2023)  Housing: Low Risk  (09/29/2023)  Transportation Needs: No Transportation Needs (09/29/2023)  Utilities: Not At Risk (09/29/2023)  Depression (PHQ2-9): Low Risk  (09/14/2023)  Physical Activity: Inactive (08/20/2020)  Social Connections: Unknown (09/29/2023)  Tobacco Use: Low Risk  (09/28/2023)    Readmission Risk Interventions    09/29/2023    4:26 PM  Readmission Risk Prevention Plan  Transportation Screening Complete  PCP or Specialist Appt within 3-5 Days Complete  HRI or Home Care Consult Complete  Social Work Consult for Recovery Care Planning/Counseling Complete  Palliative Care Screening Complete  Medication Review Oceanographer) Complete

## 2023-09-29 NOTE — Progress Notes (Signed)
 Progress Note   Patient: Maria Parrish [id number]:[id number] DOB: April 02, 1946 DOA: 09/28/2023     1 DOS: the patient was seen and examined on 09/29/2023   Brief hospital admission narrative: As per H&P written by Dr. Ella Jubilee on 09/28/2023 Maria Parrish is a 78 y.o. female with medical history significant of dementia, heart failure, hyperlipidemia, hypertension, and T2DM who presented with dyspnea.    Patient reports 2 days of progressive dyspnea, with wheezing and occasional cough, no PND, orthopnea or lower extremity edema.  Denies any fevers or chills, no nausea, vomiting or diarrhea.  She uses a walker for ambulation and lives with her son's family. Apparently her grandson had a viral illness a few days ago.  She denies tobacco abuse but reported being exposed to second hand smoke.    Today EMS was called and she was transported to the ED under code sepsis. In the ED she was found in respiratory distress and placed on Bipap.    Limited history due to patient being on full face mask non invasive ventilation.   Assessment and Plan: * Acute hypoxemic respiratory failure (HCC) -In the setting of COPD exacerbation along with pulmonary edema and acute on chronic diastolic heart failure. -Patient with underlying history of chronic kidney disease stage V and not wanting dialysis. -Continue diuresis with IV Lasix -Patient has been weaned off of BiPAP and demonstrating good saturation on 1-2 L nasal cannula supplementation -Continue treatment with steroids, bronchodilators/nebulizer management, mucolytic's and the use of flutter valve. -Follow clinical response.  Acute on chronic diastolic congestive heart failure (HCC) -Follow echo results -Continue Coreg -Continue diuresis -Low-sodium diet discussed with patient -Strict I's and O's has been ordered.  Essential hypertension, benign -Continue carvedilol, amlodipine, hydralazine and isosorbide -IV diuresis also started -Follow vital  signs. -Low-sodium diet discussed with patient.  Chronic kidney disease -Stage IV-baseline -Continue aggressive IV diuresis -Follow electrolytes trend and urine output -Patient has declined dialysis.  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  Anemia of chronic disease -No overt bleeding appreciated -Continue to follow hemoglobin trend -Transfuse for hemoglobin less than 7.  Subjective: . Off BiPAP, no chest pain, no nausea vomiting.  Reports feeling better and breathing easier.  1-2 L nasal cannula supplementation in place.  Physical Exam: Vitals:   09/29/23 1148 09/29/23 1200 09/29/23 1300 09/29/23 1548  BP:  (!) 160/50 (!) 157/122   Pulse:  69 65   Resp:  (!) 23 (!) 24   Temp: 98.1 F (36.7 C)   98.4 F (36.9 C)  TempSrc: Oral   Oral  SpO2:  100% 99%   Weight:      Height:       General exam: Afebrile, no chest pain, no nausea, no vomiting.  Reports breathing easier and feeling better. Respiratory system: Decreased breath sounds at the bases; mild expiratory wheezing.  Good saturation 1-2 L nasal cannula supplementation.  No using accessory muscles. Cardiovascular system: Rate controlled, no rubs, no gallops, no JVD. Gastrointestinal system: Abdomen is nondistended, soft and nontender. No organomegaly or masses felt. Normal bowel sounds heard. Central nervous system: No focal neurological deficits. Extremities: No cyanosis, clubbing or edema. Skin: No petechiae. Psychiatry: Mood & affect appropriate.   Data Reviewed: A1c: 6.9 CBC: White blood cells 5.7, hemoglobin 7.7 and platelet count 250K Basic metabolic panel: Sodium 135, potassium 3.7, chloride 108, CO2  17, BUN 42, creatinine 4.06 and GFR 11  Family Communication: No family at bedside.  Disposition: Status is:  Inpatient Remains inpatient appropriate because: Continue IV diuresis.   Planned Discharge Destination: Home  Time spent: 50 minutes  Author: Vassie Loll, MD 09/29/2023 5:26 PM  For on call review [email or web address].

## 2023-09-29 NOTE — Care Management Important Message (Signed)
 Important Message  Patient Details  Name: FELICITAS SINE MRN: [id number] Date of Birth: [birth year]   Important Message Given:  N/A - LOS <3 / Initial given by admissions     Corey Harold 09/29/2023, 10:48 AM

## 2023-09-30 DIAGNOSIS — J9601 Acute respiratory failure with hypoxia: Secondary | ICD-10-CM | POA: Diagnosis not present

## 2023-09-30 DIAGNOSIS — I10 Essential (primary) hypertension: Secondary | ICD-10-CM | POA: Diagnosis not present

## 2023-09-30 DIAGNOSIS — I5033 Acute on chronic diastolic (congestive) heart failure: Secondary | ICD-10-CM | POA: Diagnosis not present

## 2023-09-30 DIAGNOSIS — E1122 Type 2 diabetes mellitus with diabetic chronic kidney disease: Secondary | ICD-10-CM | POA: Diagnosis not present

## 2023-09-30 LAB — BASIC METABOLIC PANEL
Anion gap: 13 (ref 5–15)
BUN: 54 mg/dL — ABNORMAL HIGH (ref 8–23)
CO2: 17 mmol/L — ABNORMAL LOW (ref 22–32)
Calcium: 8.3 mg/dL — ABNORMAL LOW (ref 8.9–10.3)
Chloride: 103 mmol/L (ref 98–111)
Creatinine, Ser: 4.55 mg/dL — ABNORMAL HIGH (ref 0.44–1.00)
GFR, Estimated: 9 mL/min — ABNORMAL LOW (ref >60)
Glucose, Bld: 263 mg/dL — ABNORMAL HIGH (ref 70–99)
Potassium: 3.9 mmol/L (ref 3.5–5.1)
Sodium: 133 mmol/L — ABNORMAL LOW (ref 135–145)

## 2023-09-30 LAB — GLUCOSE, CAPILLARY: Glucose-Capillary: 280 mg/dL — ABNORMAL HIGH (ref 70–99)

## 2023-09-30 MED ORDER — PANTOPRAZOLE SODIUM 40 MG PO TBEC: 40.0000 mg | DELAYED_RELEASE_TABLET | Freq: Every day | ORAL | 1 refills | Status: DC

## 2023-09-30 MED ORDER — PREDNISONE 20 MG PO TABS: ORAL_TABLET | ORAL | 0 refills | Status: DC

## 2023-09-30 MED ORDER — SODIUM BICARBONATE 650 MG PO TABS: 650.0000 mg | ORAL_TABLET | Freq: Two times a day (BID) | ORAL | 1 refills | Status: DC

## 2023-09-30 MED ORDER — TORSEMIDE 20 MG PO TABS: 60.0000 mg | ORAL_TABLET | Freq: Every day | ORAL | 1 refills | Status: DC

## 2023-09-30 MED ORDER — ALBUTEROL SULFATE HFA 108 (90 BASE) MCG/ACT IN AERS: 2.0000 | INHALATION_SPRAY | Freq: Four times a day (QID) | RESPIRATORY_TRACT | 2 refills | Status: DC | PRN

## 2023-09-30 NOTE — Discharge Summary (Signed)
 Physician Discharge Summary   Patient: Maria Parrish MRN: [id number] DOB: 08/20/1945  Admit date:     09/28/2023  Discharge date: 09/30/23  Discharge Physician: Vassie Loll   PCP: Ardath Sax, FNP   Recommendations at discharge:  Outpatient hospice recommended -Continue to follow symptomatic management and supportive care.  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 Ella Jubilee on 09/28/2023 Maria Parrish is a 78 y.o. female with medical history significant of dementia, heart failure, hyperlipidemia, hypertension, and T2DM who presented with dyspnea.    Patient reports 2 days of progressive dyspnea, with wheezing and occasional cough, no PND, orthopnea or lower extremity edema.  Denies any fevers or chills, no nausea, vomiting or diarrhea.  She uses a walker for ambulation and lives with her son's family. Apparently her grandson had a viral illness a few days ago.  She denies tobacco abuse but reported being exposed to second hand smoke.    Today EMS was called and she was transported to the ED under code sepsis. In the ED she was found in respiratory distress and placed on Bipap.    Limited history due to patient being on full face mask non invasive ventilation.   Assessment and Plan: * Acute hypoxemic respiratory failure (HCC) -In the setting of COPD exacerbation along with pulmonary edema and acute on chronic diastolic heart failure. -Patient with underlying history of chronic kidney disease stage V and not wanting dialysis. -Continue diuresis with with prescription for Demadex 60 mg daily at discharge. -Discharge oxygen supplementation and demonstrating good  saturation. -Continue treatment with steroids tapering, bronchodilators management, mucolytic's and the use of flutter valve. -Follow  up with PCP.   Acute on chronic diastolic congestive heart failure (HCC) -Echo results demonstrating diastolic heart failure, no significant wall motion normalities and preserved ejection fraction. -Patient advised to maintain adequate hydration, to follow low-sodium diet and to check weight on daily basis -Discharged on Demadex -Unfortunately patient's condition treatment and care is limited secondary to advanced renal failure.   Essential hypertension, benign -Continue carvedilol, amlodipine, hydralazine and isosorbide -patient also received IV diuresis and discharged on Demadex. -Follow vital signs. -Low-sodium diet discussed with patient.   Chronic kidney disease/metabolic acidosis. -Stage V at baseline -Patient has been discharged on Demadex 60 mg daily and adjusted dose of sodium bicarbonate -Continue outpatient follow-up for hospice initiation and transition to symptomatic management and comfort care. -Patient has declined dialysis.   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   Anemia of chronic disease -No overt bleeding appreciated -Continue iron supplementation. -Transfuse for hemoglobin less than 7 if further care outside symptomatic management and hospice desire..    Consultants: None Procedures performed: See below for x-ray report; 2D echo. Disposition: Home Diet recommendation: Heart healthy/low-sodium and modified carbohydrate diet.   DISCHARGE MEDICATION: Allergies as of 09/30/2023       Reactions   Penicillins Other (See Comments)   Makes patient sore. Has patient had a PCN reaction causing immediate rash,  facial/tongue/throat swelling, SOB or lightheadedness with hypotension: No Has  patient had a PCN reaction causing severe rash involving mucus membranes or skin necrosis: No Has patient had a PCN reaction that required hospitalization No Has patient had a PCN reaction occurring within the last 10 years: No If all of the above answers are "NO", then may proceed with Cephalosporin use.   Statins Other (See Comments)   Makes wrists and fingers ache Other reaction(s): Muscle Pain   Statins Support [acid Blockers Support]    Other reaction(s): joint pain        Medication List     STOP taking these medications    Potassium Chloride ER 20 MEQ Tbcr       TAKE these medications    Accu-Chek Guide Me w/Device Kit Use to check blood glucose twice daily as directed.   Accu-Chek Guide Test test strip Generic drug: glucose blood Use to check blood glucose twice daily as directed.   Accu-Chek Softclix Lancets lancets Use to check blood glucose twice daily as instructed   albuterol 108 (90 Base) MCG/ACT inhaler Commonly known as: VENTOLIN HFA Inhale 2 puffs into the lungs every 6 (six) hours as needed for wheezing or shortness of breath.   amLODipine 10 MG tablet Commonly known as: NORVASC Take 1 tablet by mouth once daily   aspirin EC 81 MG tablet Take 81 mg by mouth daily.   Azelastine HCl 137 MCG/SPRAY Soln Place into both nostrils.   B-D SINGLE USE SWABS REGULAR Pads Use to check glucose twice daily as instructed   B-D ULTRAFINE III SHORT PEN 31G X 8 MM Misc Generic drug: Insulin Pen Needle ISE 1 PEN NEEDLE ONCE DAILY AS DIRECTED   carvedilol 12.5 MG tablet Commonly known as: COREG Take 1 tablet by mouth twice daily   ezetimibe 10 MG tablet Commonly known as: ZETIA Take 1 tablet (10 mg total) by mouth daily.   ferrous sulfate 325 (65 FE) MG tablet Take 325 mg by mouth daily.   hydrALAZINE 100 MG tablet Commonly known as: APRESOLINE Take 1 tablet (100 mg  total) by mouth 2 (two) times daily.   isosorbide mononitrate 30 MG 24 hr tablet Commonly known as: IMDUR Take 1 tablet (30 mg total) by mouth daily.   pantoprazole 40 MG tablet Commonly known as: Protonix Take 1 tablet (40 mg total) by mouth daily.   predniSONE 20 MG tablet Commonly known as: DELTASONE Take 3 tablets by mouth daily x 1 day; then 2 tablets by mouth daily x 2 days; then 1 tablet by mouth daily x 3 days; then half tablet by mouth daily x 3 days and stop prednisone.   sodium bicarbonate 650 MG tablet Take 1 tablet (650 mg total) by mouth 2 (two) times daily. What changed: when to take this   torsemide 20 MG tablet Commonly known as: DEMADEX Take 3 tablets (60 mg total) by mouth daily.   Evaristo Bury FlexTouch 100 UNIT/ML FlexTouch Pen Generic drug: insulin degludec Inject 24 Units into the skin at bedtime.   VITAMIN B-12 PO Take 1 tablet by mouth daily.        Follow-up Information     Ardath Sax, FNP. Schedule an appointment as soon as possible for a visit in 10 day(s).   Specialty: Nurse Practitioner Contact information: [address] [place] [address] [phone]                Discharge Exam: Ceasar Mons Weights   09/28/23 2353  Weight: 64.7 kg   General exam: Afebrile,  no chest pain, no nausea, no vomiting.  Patient reports no orthopnea and was with good saturation on room air at discharge. Respiratory system: Improved air movement bilaterally; no using accessory muscle.  Good saturation. Cardiovascular system: Rate controlled, no rubs, no gallops, no JVD. Gastrointestinal system: Abdomen is nondistended, soft and nontender. No organomegaly or masses felt. Normal bowel sounds heard. Central nervous system: No focal neurological deficits. Extremities: No cyanosis, clubbing or edema. Skin: No petechiae. Psychiatry: Mood & affect appropriate.   Condition at discharge: Stable and improved.  The results of significant diagnostics from  this hospitalization (including imaging, microbiology, ancillary and laboratory) are listed below for reference.   Imaging Studies: ECHOCARDIOGRAM COMPLETE Result Date: 09/29/2023    ECHOCARDIOGRAM REPORT   Patient Name:   Maria Parrish Date of Exam: 09/29/2023 Medical Rec #:  [id number]    Height:       65.0 in Accession #:    [id number]   Weight:       142.6 lb Date of Birth:  [birth year]    BSA:          1.713 m Patient Age:    78 years     BP:           167/92 mmHg Patient Gender: F            HR:           78 bpm. [facility] Procedure: 2D Echo, Cardiac Doppler and Color Doppler (Both Spectral and Color            Flow Doppler were utilized during procedure). Indications:    Dyspnea R06.00  History:        Patient has prior history of Echocardiogram examinations, most                 recent 06/21/2021. Risk Factors:Hypertension, Diabetes,                 Dyslipidemia and Non-Smoker. Dementia and Subacute confusional                 state.  Sonographer:    Celesta Gentile RCS Referring Phys: [id number] MAURICIO DANIEL ARRIEN IMPRESSIONS  1. Left ventricular ejection fraction, by estimation, is 55 to 60%. The left ventricle has normal function. The left ventricle has no regional wall motion abnormalities. There is mild left ventricular hypertrophy. Left ventricular diastolic parameters are consistent with Grade I diastolic dysfunction (impaired relaxation).  2. Right ventricular systolic function is normal. The right ventricular size is normal. There is mildly elevated pulmonary artery systolic pressure. The estimated right ventricular systolic pressure is 37.6 mmHg.  3. Left atrial size was mildly dilated.  4. The mitral valve is normal in structure. Mild mitral valve regurgitation.  5. The aortic valve is tricuspid. Aortic valve regurgitation is not visualized.  6. The inferior vena cava is normal in size with greater than 50% respiratory variability, suggesting right atrial pressure of 3 mmHg.  FINDINGS  Left Ventricle: Left ventricular ejection fraction, by estimation, is 55 to 60%. The left ventricle has normal function. The left ventricle has no regional wall motion abnormalities. The left ventricular internal cavity size was normal in size. There is  mild left ventricular hypertrophy. Left ventricular diastolic parameters are consistent with Grade I diastolic dysfunction (impaired relaxation). Right Ventricle: The right ventricular size is normal. Right ventricular systolic function is normal. There is mildly elevated pulmonary artery systolic pressure. The tricuspid regurgitant velocity is 2.94 m/s,  and with an assumed right atrial pressure of 3 mmHg, the estimated right ventricular systolic pressure is 37.6 mmHg. Left Atrium: Left atrial size was mildly dilated. Right Atrium: Right atrial size was normal in size. Pericardium: There is no evidence of pericardial effusion. Mitral Valve: The mitral valve is normal in structure. Mild mitral valve regurgitation. Tricuspid Valve: Tricuspid valve regurgitation is mild. Aortic Valve: The aortic valve is tricuspid. Aortic valve regurgitation is not visualized. Pulmonic Valve: Pulmonic valve regurgitation is not visualized. Aorta: The aortic root is normal in size and structure. Venous: The inferior vena cava is normal in size with greater than 50% respiratory variability, suggesting right atrial pressure of 3 mmHg. IAS/Shunts: No atrial level shunt detected by color flow Doppler.  LEFT VENTRICLE PLAX 2D LVIDd:         5.30 cm   Diastology LVIDs:         3.60 cm   LV e' medial:    5.00 cm/s LV PW:         1.20 cm   LV E/e' medial:  20.9 LV IVS:        1.00 cm   LV e' lateral:   5.87 cm/s LVOT diam:     1.80 cm   LV E/e' lateral: 17.8 LV SV:         57 LV SV Index:   33 LVOT Area:     2.54 cm  RIGHT VENTRICLE RV S prime:     14.50 cm/s TAPSE (M-mode): 2.4 cm LEFT ATRIUM             Index        RIGHT ATRIUM           Index LA diam:        3.70 cm 2.16 cm/m    RA Area:     18.40 cm LA Vol (A2C):   73.6 ml 42.95 ml/m  RA Volume:   50.70 ml  29.59 ml/m LA Vol (A4C):   55.1 ml 32.16 ml/m LA Biplane Vol: 63.7 ml 37.18 ml/m  AORTIC VALVE LVOT Vmax:   101.00 cm/s LVOT Vmean:  68.200 cm/s LVOT VTI:    0.225 m  AORTA Ao Root diam: 3.30 cm MITRAL VALVE                TRICUSPID VALVE MV Area (PHT): 5.02 cm     TR Peak grad:   34.6 mmHg MV Decel Time: 151 msec     TR Vmax:        294.00 cm/s MR Peak grad: 102.4 mmHg MR Mean grad: 64.0 mmHg     SHUNTS MR Vmax:      506.00 cm/s   Systemic VTI:  0.22 m MR Vmean:     374.0 cm/s    Systemic Diam: 1.80 cm MV E velocity: 104.50 cm/s MV A velocity: 114.50 cm/s MV E/A ratio:  0.91 [place] signed by Carolan Clines Signature Date/Time: 09/29/2023/1:44:46 PM    Final    DG Chest Port 1 View Result Date: 09/28/2023 CLINICAL DATA:  Shortness of breath, sepsis EXAM: PORTABLE CHEST 1 VIEW COMPARISON:  09/13/2019 FINDINGS: Heart and mediastinal contours within normal limits. Patchy bilateral airspace opacities. Trace left pleural effusion. No acute bony abnormality. IMPRESSION: Patchy bilateral airspace disease could reflect edema or infection. Electronically Signed   By: Charlett Nose M.D.   On: 09/28/2023 21:11    Microbiology: Results for orders placed or performed during the hospital encounter of 09/28/23  Resp panel by RT-PCR (RSV, Flu A&B, Covid) Anterior Nasal Swab     Status: None   Collection Time: 09/28/23  7:39 PM   Specimen: Anterior Nasal Swab  Result Value Ref Range Status   SARS Coronavirus 2 by RT PCR NEGATIVE NEGATIVE Final    Comment: (NOTE) SARS-CoV-2 target nucleic acids are NOT DETECTED.  The SARS-CoV-2 RNA is generally detectable in upper respiratory specimens during the acute phase of infection. The lowest concentration of SARS-CoV-2 viral copies this assay can detect is 138 copies/mL. A negative result does not preclude SARS-Cov-2 infection and should not be used as the sole basis for  treatment or other patient management decisions. A negative result may occur with  improper specimen collection/handling, submission of specimen other than nasopharyngeal swab, presence of viral mutation(s) within the areas targeted by this assay, and inadequate number of viral copies(<138 copies/mL). A negative result must be combined with clinical observations, patient history, and epidemiological information. The expected result is Negative.  Fact Sheet for Patients:  BloggerCourse.com  Fact Sheet for Healthcare Providers:  SeriousBroker.it  This test is no t yet approved or cleared by the [place] FDA and  has been authorized for detection and/or diagnosis of SARS-CoV-2 by FDA under an Emergency Use Authorization (EUA). This EUA will remain  in effect (meaning this test can be used) for the duration of the COVID-19 declaration under Section 564(b)(1) of the Act, 21 U.S.C.section 360bbb-3(b)(1), unless the authorization is terminated  or revoked sooner.       Influenza A by PCR NEGATIVE NEGATIVE Final   Influenza B by PCR NEGATIVE NEGATIVE Final    Comment: (NOTE) The Xpert Xpress SARS-CoV-2/FLU/RSV plus assay is intended as an aid in the diagnosis of influenza from Nasopharyngeal swab specimens and should not be used as a sole basis for treatment. Nasal washings and aspirates are unacceptable for Xpert Xpress SARS-CoV-2/FLU/RSV testing.  Fact Sheet for Patients: BloggerCourse.com  Fact Sheet for Healthcare Providers: SeriousBroker.it  This test is not yet approved or cleared by the [place] FDA and has been authorized for detection and/or diagnosis of SARS-CoV-2 by FDA under an Emergency Use Authorization (EUA). This EUA will remain in effect (meaning this test can be used) for the duration of the COVID-19 declaration under Section 564(b)(1) of the Act, 21  U.S.C. section 360bbb-3(b)(1), unless the authorization is terminated or revoked.     Resp Syncytial Virus by PCR NEGATIVE NEGATIVE Final    Comment: (NOTE) Fact Sheet for Patients: BloggerCourse.com  Fact Sheet for Healthcare Providers: SeriousBroker.it  This test is not yet approved or cleared by the [place] FDA and has been authorized for detection and/or diagnosis of SARS-CoV-2 by FDA under an Emergency Use Authorization (EUA). This EUA will remain in effect (meaning this test can be used) for the duration of the COVID-19 declaration under Section 564(b)(1) of the Act, 21 U.S.C. section 360bbb-3(b)(1), unless the authorization is terminated or revoked.  Performed at [facility], [address] [place], [place] [address]   Blood Culture (routine x 2)     Status: None (Preliminary result)   Collection Time: 09/28/23  7:39 PM   Specimen: BLOOD RIGHT WRIST  Result Value Ref Range Status   Specimen Description BLOOD RIGHT WRIST  Final   Special Requests NONE  Final   Culture   Final    NO GROWTH 2 DAYS Performed at [facility], [address] [place], [place] [address]    Report Status PENDING  Incomplete  Blood Culture (routine x 2)     Status: None (Preliminary result)   Collection Time: 09/28/23  7:39 PM   Specimen: BLOOD RIGHT FOREARM  Result Value Ref Range Status   Specimen Description BLOOD RIGHT FOREARM  Final   Special Requests NONE  Final   Culture   Final    NO GROWTH 2 DAYS Performed at [facility], [address], [place], [place] [address]    Report Status PENDING  Incomplete  MRSA Next Gen by PCR, Nasal     Status: None   Collection Time: 09/28/23 10:29 PM   Specimen: Nasal Mucosa; Nasal Swab  Result Value Ref Range Status   MRSA by PCR Next Gen NOT DETECTED NOT DETECTED Final    Comment: (NOTE) The GeneXpert MRSA Assay (FDA approved for NASAL specimens only), is one component of a comprehensive  MRSA colonization surveillance program. It is not intended to diagnose MRSA infection nor to guide or monitor treatment for MRSA infections. Test performance is not FDA approved in patients less than 54 years old. Performed at [facility], [address] [place], [place] [address]     Labs: CBC: Recent Labs  Lab 09/28/23 1939 09/29/23 0424  WBC 10.1 5.7  NEUTROABS 8.8*  --   HGB 10.0* 7.7*  HCT 31.2* 23.8*  MCV 89.1 87.5  PLT 357 250   Basic Metabolic Panel: Recent Labs  Lab 09/28/23 1939 09/29/23 0424 09/30/23 0420  NA 137 135 133*  K 3.7 3.7 3.9  CL 107 108 103  CO2 16* 17* 17*  GLUCOSE 243* 233* 263*  BUN 37* 42* 54*  CREATININE 4.03* 4.06* 4.55*  CALCIUM 9.0 8.4* 8.3*   Liver Function Tests: Recent Labs  Lab 09/28/23 1939  AST 17  ALT 12  ALKPHOS 54  BILITOT 0.5  PROT 7.9  ALBUMIN 3.3*   CBG: Recent Labs  Lab 09/29/23 0815 09/29/23 1139 09/29/23 1538 09/29/23 2128 09/30/23 0733  GLUCAP 273* 205* 120* 147* 280*    Discharge time spent: greater than 30 minutes.  Signed: Vassie Loll, MD [facility] 09/30/2023

## 2023-09-30 NOTE — TOC Progression Note (Signed)
 Transition of Care [facility]) - Progression Note    Patient Details  Name: Maria Parrish MRN: [id number] Date of Birth: 06/09/1946  Transition of [facility]) CM/SW Contact  Catalina [place], [place] Phone Number: 09/30/2023, 9:16 AM  Clinical Narrative:     Pt is high risk for readmission. CSW completed assessment with pts son. Pt is partially independent, she resides with her son.  He works daily 7-3 PM and pt home alone.  Pt completes limited ADLs- she has a shower chair, but afraid she may fall, so she will wash up. Pt does not cook, he provides food or she will use microwave only for oatmeal and small quick items. Son reports that he will take off work for her Dr. Insurance underwriter. He shared that he will assist pt with insulin set up but she often declines medication/"shot", stating she does not need it.  Regarding DME, there is a walker,  special chair for comfort.  Son shared that pt will sometimes urinate in bed- he is uncertain if she is being "lazy" or unable to get up to use restroom- he had [employer]/Ins[employer] representative visit and he talked to them about an external device like PureWick or catheter system so she can urinate if needed in bed- no response  but they are interested in this. CSW then sent [place] to MD advising of assessment info and shared son plans to visit hospital today. TOC to follow.    Expected Discharge Plan: Home/Self Care Barriers to Discharge: Continued Medical Work up  Expected Discharge Plan and Services In-house Referral: Clinical Social Work     Living arrangements for the past 2 months: Single Family Home                                       Social Determinants of Health (SDOH) Interventions SDOH Screenings   Food Insecurity: No Food Insecurity (09/29/2023)  Housing: Low Risk  (09/29/2023)  Transportation Needs: No Transportation Needs (09/29/2023)  Utilities: Not At Risk (09/29/2023)  Depression (PHQ2-9): Low Risk  (09/14/2023)  Physical Activity: Inactive  (08/20/2020)  Social Connections: Unknown (09/29/2023)  Tobacco Use: Low Risk  (09/28/2023)    Readmission Risk Interventions    09/29/2023    4:26 PM  Readmission Risk Prevention Plan  Transportation Screening Complete  PCP or Specialist Appt within 3-5 Days Complete  HRI or Home Care Consult Complete  Social Work Consult for Recovery Care Planning/Counseling Complete  Palliative Care Screening Complete  Medication Review Oceanographer) Complete

## 2023-09-30 NOTE — Progress Notes (Signed)
 Patient has discharge orders, discharge teaching given to patient and son bedside, no further questions at this time, patient wheeled down to main lobby via wheelchair by staff to vehicle accompanied by son.

## 2023-10-01 LAB — LACTIC ACID, PLASMA: Lactic Acid, Venous: 2.2 mmol/L (ref 0.5–1.9)

## 2023-10-03 LAB — CULTURE, BLOOD (ROUTINE X 2)
Culture: NO GROWTH
Culture: NO GROWTH

## 2023-10-31 ENCOUNTER — Ambulatory Visit: Payer: Medicare HMO | Admitting: "Endocrinology

## 2023-11-16 ENCOUNTER — Encounter: Attending: Nephrology

## 2023-11-18 ENCOUNTER — Encounter (HOSPITAL_COMMUNITY): Payer: Self-pay

## 2023-11-18 ENCOUNTER — Other Ambulatory Visit: Payer: Self-pay

## 2023-11-18 ENCOUNTER — Inpatient Hospital Stay (HOSPITAL_COMMUNITY)
Admission: EM | Admit: 2023-11-18 | Discharge: 2023-11-21 | DRG: 291 | Disposition: A | Discharge status: Hospice | Attending: Family Medicine | Admitting: Family Medicine

## 2023-11-18 ENCOUNTER — Emergency Department (HOSPITAL_COMMUNITY)

## 2023-11-18 DIAGNOSIS — I5033 Acute on chronic diastolic (congestive) heart failure: Secondary | ICD-10-CM | POA: Diagnosis present

## 2023-11-18 DIAGNOSIS — E876 Hypokalemia: Secondary | ICD-10-CM | POA: Diagnosis present

## 2023-11-18 DIAGNOSIS — Z515 Encounter for palliative care: Secondary | ICD-10-CM

## 2023-11-18 DIAGNOSIS — E213 Hyperparathyroidism, unspecified: Secondary | ICD-10-CM | POA: Diagnosis present

## 2023-11-18 DIAGNOSIS — K219 Gastro-esophageal reflux disease without esophagitis: Secondary | ICD-10-CM | POA: Diagnosis present

## 2023-11-18 DIAGNOSIS — Z8249 Family history of ischemic heart disease and other diseases of the circulatory system: Secondary | ICD-10-CM

## 2023-11-18 DIAGNOSIS — E1121 Type 2 diabetes mellitus with diabetic nephropathy: Secondary | ICD-10-CM

## 2023-11-18 DIAGNOSIS — F0390 Unspecified dementia without behavioral disturbance: Secondary | ICD-10-CM | POA: Diagnosis present

## 2023-11-18 DIAGNOSIS — J449 Chronic obstructive pulmonary disease, unspecified: Secondary | ICD-10-CM | POA: Diagnosis present

## 2023-11-18 DIAGNOSIS — I132 Hypertensive heart and chronic kidney disease with heart failure and with stage 5 chronic kidney disease, or end stage renal disease: Principal | ICD-10-CM | POA: Diagnosis present

## 2023-11-18 DIAGNOSIS — E785 Hyperlipidemia, unspecified: Secondary | ICD-10-CM | POA: Diagnosis present

## 2023-11-18 DIAGNOSIS — E1142 Type 2 diabetes mellitus with diabetic polyneuropathy: Secondary | ICD-10-CM | POA: Diagnosis present

## 2023-11-18 DIAGNOSIS — E872 Acidosis, unspecified: Secondary | ICD-10-CM | POA: Diagnosis present

## 2023-11-18 DIAGNOSIS — E1122 Type 2 diabetes mellitus with diabetic chronic kidney disease: Secondary | ICD-10-CM | POA: Diagnosis not present

## 2023-11-18 DIAGNOSIS — Z1152 Encounter for screening for COVID-19: Secondary | ICD-10-CM

## 2023-11-18 DIAGNOSIS — D631 Anemia in chronic kidney disease: Secondary | ICD-10-CM | POA: Diagnosis present

## 2023-11-18 DIAGNOSIS — I5021 Acute systolic (congestive) heart failure: Secondary | ICD-10-CM

## 2023-11-18 DIAGNOSIS — Z66 Do not resuscitate: Secondary | ICD-10-CM | POA: Diagnosis present

## 2023-11-18 DIAGNOSIS — Z794 Long term (current) use of insulin: Secondary | ICD-10-CM

## 2023-11-18 DIAGNOSIS — J9601 Acute respiratory failure with hypoxia: Principal | ICD-10-CM | POA: Diagnosis present

## 2023-11-18 DIAGNOSIS — Z7982 Long term (current) use of aspirin: Secondary | ICD-10-CM

## 2023-11-18 DIAGNOSIS — I169 Hypertensive crisis, unspecified: Secondary | ICD-10-CM | POA: Diagnosis present

## 2023-11-18 DIAGNOSIS — N185 Chronic kidney disease, stage 5: Secondary | ICD-10-CM | POA: Diagnosis present

## 2023-11-18 DIAGNOSIS — Z833 Family history of diabetes mellitus: Secondary | ICD-10-CM

## 2023-11-18 DIAGNOSIS — Z79899 Other long term (current) drug therapy: Secondary | ICD-10-CM

## 2023-11-18 LAB — BASIC METABOLIC PANEL WITH GFR
Anion gap: 14 (ref 5–15)
BUN: 39 mg/dL — ABNORMAL HIGH (ref 8–23)
CO2: 17 mmol/L — ABNORMAL LOW (ref 22–32)
Calcium: 9.5 mg/dL (ref 8.9–10.3)
Chloride: 107 mmol/L (ref 98–111)
Creatinine, Ser: 4.02 mg/dL — ABNORMAL HIGH (ref 0.44–1.00)
GFR, Estimated: 11 mL/min — ABNORMAL LOW (ref >60)
Glucose, Bld: 199 mg/dL — ABNORMAL HIGH (ref 70–99)
Potassium: 3 mmol/L — ABNORMAL LOW (ref 3.5–5.1)
Sodium: 138 mmol/L (ref 135–145)

## 2023-11-18 LAB — CBC WITH DIFFERENTIAL/PLATELET
Abs Immature Granulocytes: 0.06 10*3/uL (ref 0.00–0.07)
Basophils Absolute: 0 10*3/uL (ref 0.0–0.1)
Basophils Relative: 0 %
Eosinophils Absolute: 0.1 10*3/uL (ref 0.0–0.5)
Eosinophils Relative: 1 %
HCT: 26.5 % — ABNORMAL LOW (ref 36.0–46.0)
Hemoglobin: 8.7 g/dL — ABNORMAL LOW (ref 12.0–15.0)
Immature Granulocytes: 1 %
Lymphocytes Relative: 6 %
Lymphs Abs: 0.6 10*3/uL — ABNORMAL LOW (ref 0.7–4.0)
MCH: 29.2 pg (ref 26.0–34.0)
MCHC: 32.8 g/dL (ref 30.0–36.0)
MCV: 88.9 fL (ref 80.0–100.0)
Monocytes Absolute: 0.4 10*3/uL (ref 0.1–1.0)
Monocytes Relative: 4 %
Neutro Abs: 9.6 10*3/uL — ABNORMAL HIGH (ref 1.7–7.7)
Neutrophils Relative %: 88 %
Platelets: 368 10*3/uL (ref 150–400)
RBC: 2.98 MIL/uL — ABNORMAL LOW (ref 3.87–5.11)
RDW: 16.1 % — ABNORMAL HIGH (ref 11.5–15.5)
WBC: 10.8 10*3/uL — ABNORMAL HIGH (ref 4.0–10.5)
nRBC: 0 % (ref 0.0–0.2)

## 2023-11-18 LAB — GLUCOSE, CAPILLARY
Glucose-Capillary: 141 mg/dL — ABNORMAL HIGH (ref 70–99)
Glucose-Capillary: 145 mg/dL — ABNORMAL HIGH (ref 70–99)
Glucose-Capillary: 140 mg/dL — ABNORMAL HIGH (ref 70–99)

## 2023-11-18 LAB — PHOSPHORUS: Phosphorus: 3.8 mg/dL (ref 2.5–4.6)

## 2023-11-18 LAB — MAGNESIUM: Magnesium: 2 mg/dL (ref 1.7–2.4)

## 2023-11-18 LAB — RESP PANEL BY RT-PCR (RSV, FLU A&B, COVID)  RVPGX2
Influenza A by PCR: NEGATIVE
Influenza B by PCR: NEGATIVE
Resp Syncytial Virus by PCR: NEGATIVE
SARS Coronavirus 2 by RT PCR: NEGATIVE

## 2023-11-18 LAB — BRAIN NATRIURETIC PEPTIDE: B Natriuretic Peptide: 629 pg/mL — ABNORMAL HIGH (ref 0.0–100.0)

## 2023-11-18 MED ORDER — SODIUM BICARBONATE 650 MG PO TABS
650.0000 mg | ORAL_TABLET | Freq: Two times a day (BID) | ORAL | Status: DC
  Administered 2023-11-18 – 2023-11-21 (×7): 650 mg via ORAL
  Filled 2023-11-18 (×8): qty 1

## 2023-11-18 MED ORDER — HYDRALAZINE HCL 25 MG PO TABS
100.0000 mg | ORAL_TABLET | Freq: Once | ORAL | Status: AC
  Administered 2023-11-18: 100 mg via ORAL
  Filled 2023-11-18: qty 4

## 2023-11-18 MED ORDER — SODIUM CHLORIDE 0.9% FLUSH
3.0000 mL | INTRAVENOUS | Status: DC | PRN
  Administered 2023-11-18: 3 mL via INTRAVENOUS

## 2023-11-18 MED ORDER — ACETAMINOPHEN 325 MG PO TABS: 650.0000 mg | ORAL_TABLET | Freq: Four times a day (QID) | ORAL | Status: DC | PRN

## 2023-11-18 MED ORDER — SODIUM CHLORIDE 0.9 % IV SOLN: 250.0000 mL | INTRAVENOUS | Status: AC | PRN

## 2023-11-18 MED ORDER — ONDANSETRON HCL 4 MG/2ML IJ SOLN: 4.0000 mg | Freq: Four times a day (QID) | INTRAMUSCULAR | Status: DC | PRN

## 2023-11-18 MED ORDER — ASPIRIN 81 MG PO TBEC
81.0000 mg | DELAYED_RELEASE_TABLET | Freq: Every day | ORAL | Status: DC
  Administered 2023-11-18: 81 mg via ORAL
  Filled 2023-11-18 (×2): qty 1

## 2023-11-18 MED ORDER — PANTOPRAZOLE SODIUM 40 MG PO TBEC
40.0000 mg | DELAYED_RELEASE_TABLET | Freq: Every day | ORAL | Status: DC
  Administered 2023-11-18 – 2023-11-21 (×4): 40 mg via ORAL
  Filled 2023-11-18 (×4): qty 1

## 2023-11-18 MED ORDER — ACETAMINOPHEN 650 MG RE SUPP: 650.0000 mg | Freq: Four times a day (QID) | RECTAL | Status: DC | PRN

## 2023-11-18 MED ORDER — AMLODIPINE BESYLATE 5 MG PO TABS
10.0000 mg | ORAL_TABLET | Freq: Every day | ORAL | Status: DC
  Administered 2023-11-19 – 2023-11-21 (×3): 10 mg via ORAL
  Filled 2023-11-18 (×3): qty 2

## 2023-11-18 MED ORDER — HYDRALAZINE HCL 20 MG/ML IJ SOLN
10.0000 mg | Freq: Once | INTRAMUSCULAR | Status: AC
  Administered 2023-11-18: 10 mg via INTRAVENOUS
  Filled 2023-11-18: qty 1

## 2023-11-18 MED ORDER — POTASSIUM CHLORIDE CRYS ER 20 MEQ PO TBCR
20.0000 meq | EXTENDED_RELEASE_TABLET | Freq: Once | ORAL | Status: AC
  Administered 2023-11-18: 20 meq via ORAL
  Filled 2023-11-18: qty 1

## 2023-11-18 MED ORDER — NITROGLYCERIN 0.4 MG SL SUBL
0.4000 mg | SUBLINGUAL_TABLET | Freq: Once | SUBLINGUAL | Status: AC
  Administered 2023-11-18: 0.4 mg via SUBLINGUAL
  Filled 2023-11-18: qty 1

## 2023-11-18 MED ORDER — FUROSEMIDE 10 MG/ML IJ SOLN
80.0000 mg | Freq: Once | INTRAMUSCULAR | Status: AC
  Administered 2023-11-18: 80 mg via INTRAVENOUS
  Filled 2023-11-18: qty 8

## 2023-11-18 MED ORDER — SODIUM CHLORIDE 0.9% FLUSH
3.0000 mL | Freq: Two times a day (BID) | INTRAVENOUS | Status: DC
  Administered 2023-11-18 – 2023-11-21 (×6): 3 mL via INTRAVENOUS

## 2023-11-18 MED ORDER — INSULIN ASPART 100 UNIT/ML IJ SOLN
0.0000 [IU] | Freq: Three times a day (TID) | INTRAMUSCULAR | Status: DC
  Administered 2023-11-19 (×2): 1 [IU] via SUBCUTANEOUS
  Administered 2023-11-19: 2 [IU] via SUBCUTANEOUS
  Administered 2023-11-20 (×2): 1 [IU] via SUBCUTANEOUS
  Administered 2023-11-20: 2 [IU] via SUBCUTANEOUS
  Administered 2023-11-21: 1 [IU] via SUBCUTANEOUS
  Administered 2023-11-21: 3 [IU] via SUBCUTANEOUS

## 2023-11-18 MED ORDER — AMLODIPINE BESYLATE 5 MG PO TABS
10.0000 mg | ORAL_TABLET | Freq: Once | ORAL | Status: AC
  Administered 2023-11-18: 10 mg via ORAL
  Filled 2023-11-18: qty 2

## 2023-11-18 MED ORDER — EZETIMIBE 10 MG PO TABS
10.0000 mg | ORAL_TABLET | Freq: Every day | ORAL | Status: DC
  Administered 2023-11-18 – 2023-11-21 (×4): 10 mg via ORAL
  Filled 2023-11-18 (×4): qty 1

## 2023-11-18 MED ORDER — ONDANSETRON HCL 4 MG PO TABS: 4.0000 mg | ORAL_TABLET | Freq: Four times a day (QID) | ORAL | Status: DC | PRN

## 2023-11-18 MED ORDER — HYDRALAZINE HCL 50 MG PO TABS
50.0000 mg | ORAL_TABLET | Freq: Three times a day (TID) | ORAL | Status: DC
  Administered 2023-11-18 – 2023-11-21 (×9): 50 mg via ORAL
  Filled 2023-11-18 (×9): qty 1

## 2023-11-18 MED ORDER — INSULIN ASPART 100 UNIT/ML IJ SOLN
0.0000 [IU] | Freq: Every day | INTRAMUSCULAR | Status: DC
  Administered 2023-11-19 – 2023-11-20 (×2): 2 [IU] via SUBCUTANEOUS

## 2023-11-18 MED ORDER — HEPARIN SODIUM (PORCINE) 5000 UNIT/ML IJ SOLN
5000.0000 [IU] | Freq: Three times a day (TID) | INTRAMUSCULAR | Status: DC
  Administered 2023-11-18 (×2): 5000 [IU] via SUBCUTANEOUS
  Filled 2023-11-18 (×3): qty 1

## 2023-11-18 MED ORDER — ISOSORBIDE MONONITRATE ER 30 MG PO TB24
30.0000 mg | ORAL_TABLET | Freq: Every day | ORAL | Status: DC
  Administered 2023-11-19 – 2023-11-21 (×3): 30 mg via ORAL
  Filled 2023-11-18 (×3): qty 1

## 2023-11-18 MED ORDER — POTASSIUM CHLORIDE CRYS ER 20 MEQ PO TBCR
40.0000 meq | EXTENDED_RELEASE_TABLET | Freq: Once | ORAL | Status: AC
  Administered 2023-11-18: 40 meq via ORAL
  Filled 2023-11-18: qty 2

## 2023-11-18 MED ORDER — CARVEDILOL 12.5 MG PO TABS
12.5000 mg | ORAL_TABLET | Freq: Two times a day (BID) | ORAL | Status: DC
  Administered 2023-11-18 – 2023-11-21 (×6): 12.5 mg via ORAL
  Filled 2023-11-18 (×6): qty 1

## 2023-11-18 MED ORDER — FUROSEMIDE 10 MG/ML IJ SOLN
40.0000 mg | Freq: Two times a day (BID) | INTRAMUSCULAR | Status: DC
  Administered 2023-11-18 – 2023-11-19 (×2): 40 mg via INTRAVENOUS
  Filled 2023-11-18 (×2): qty 4

## 2023-11-18 NOTE — ED Triage Notes (Addendum)
 Pt from home complains of SOB, since yesterday. Per family ems was called yesterday a gave 2 pumps of an inhaler. Pt fatigued after transferring from chair to bed. Expiratory wheezes noted. Sats @ 87% on RA, placed on 3L [place] for 96%. Pt AAOx4 BP 209/102 at time of triage, pt states that she hasn't taken meds today.

## 2023-11-18 NOTE — Progress Notes (Signed)
 Patient son reports noncompliance with insulin  regimen and scheduled home meds.

## 2023-11-18 NOTE — ED Notes (Signed)
 Attempted to call report and left number with nurse will try again.

## 2023-11-18 NOTE — Care Management Important Message (Signed)
 Important Message  Patient Details  Name: Maria Parrish MRN: [id number] Date of Birth: [birth year]   Important Message Given:   Yes.     Lynda Sands, RN 11/18/2023, 3:20 PM

## 2023-11-18 NOTE — H&P (Signed)
 [facility] History and Physical  Maria Parrish NWG:[id number] DOB: [birth year] DOA: 11/18/2023  Referring physician: Dr. Lewis Red  PCP: Ayesha Lente, FNP   Chief Complaint: Shortness of breath  HPI: STORY Maria Parrish is a 78 y.o. female with past medical history significant for chronic diastolic heart failure, hyperlipidemia, history of COPD, hypertension, type 2 diabetes mellitus with nephropathy and chronic kidney disease stage V; who presented to the hospital secondary to increased shortness of breath and orthopnea.  On 11/17/2023 EMS was contacted secondary to patient's shortness of breath bronchodilator management were provided patient's symptoms improved and decided to stayed at home instead of seeking further assistance.  Patient doesn't use oxygen  supplementation at baseline.  Today patient's symptoms worsen even further and family decided to bring her to the hospital for further evaluation and management.  She was requiring oxygen  supplementation to maintain saturation above 90% at time of arrival; there was noticeable increase respiratory distress/tachypnea, positive crackles and expiratory wheezing.  Patient was also found to be in hypertensive crisis and expressed not taking her medications for blood pressure control in the last 2 days prior to presentation to the hospital.  No fever, no chest pain, no nausea, no vomiting, no dysuria/hematuria, no melena, no hematochezia, no sick contacts or any other complaints.  Workup demonstrating vascular congestion on chest x-ray and elevated BNP; CBC within normal limits and for the most part stable stage V chronic kidney disease (chronic metab acidosis).  Patient received IV Lasix , nitroglycerin , resumption of home blood pressure medications and was placed on BiPAP with excellent response to her symptoms; she was weaned off BiPAP, with a blood pressure in the 150-160 systolic at the moment that she was called out for admission.  TR[facility] contacted to  place in the hospital for further evaluation and management.  Of note, patient with a previous hospitalization while she has expressed not wanting to pursued dialysis and was more interested in controlling symptoms and being kept comfortable at home; she is DNR.  Review of Systems:  Constitutional:  No weight loss, night sweats, Fevers, chills, fatigue.  HEENT:  No headaches, Difficulty swallowing,Tooth/dental problems,Sore throat,  No sneezing, itching, ear ache, nasal congestion, post nasal drip,  Cardio-vascular:  No chest pain, Orthopnea, PND, swelling in lower extremities, anasarca, dizziness, palpitations  GI:  No heartburn, indigestion, abdominal pain, nausea, vomiting, diarrhea, change in bowel habits, loss of appetite  Resp:  Positive crackles bilaterally; mild expiratory wheezing; no using accessory muscle.  2-3 L nasal cannula in place. Skin:  no rash or lesions.  GU:  no dysuria, change in color of urine, no urgency or frequency. No flank pain.  Musculoskeletal:  No joint pain or swelling. No decreased range of motion. No back pain.  Psych:  No change in mood or affect. No depression or anxiety. No memory loss.   Past Medical History:  Diagnosis Date   Assistance needed for mobility    walks with walker   Confusion    Dementia (HCC)    Diabetes mellitus without complication (HCC)    Generalized weakness    Heart failure with improved ejection fraction (HFimpEF) (HCC)    a. EF 40-45% by echo in 04/2020 b. normalized to 55-60% by echo in 06/2021   Hypercalcemia    Hyperlipidemia    Hyperparathyroidism (HCC)    Hypertension    Hypertension    Moderate protein-calorie malnutrition (HCC)    Peripheral neuropathy    UTI (lower urinary tract infection)    Past  Surgical History:  Procedure Laterality Date   COLONOSCOPY N/A 08/12/2015   Procedure: COLONOSCOPY;  Surgeon: Suzette Espy, MD;  Location: [facility];  Service: Endoscopy;  Laterality: N/A;  1245pm    ESOPHAGOGASTRODUODENOSCOPY N/A 08/12/2015   Procedure: ESOPHAGOGASTRODUODENOSCOPY (EGD);  Surgeon: Suzette Espy, MD;  Location: [facility];  Service: Endoscopy;  Laterality: N/A;   none     Social History:  reports that she has never smoked. She has never used smokeless tobacco. She reports that she does not drink alcohol and does not use drugs.  Allergies  Allergen Reactions   Statins Other (See Comments)    Makes wrists and fingers ache Muscle Pain and Joint Pain   Penicillins Other (See Comments)    Makes patient sore.    Family History  Problem Relation Age of Onset   Heart attack Mother    Diabetes Father    Hypertension Brother    Hypertension Brother    Diabetes Brother    Colon cancer Neg Hx      Prior to Admission medications   Medication Sig Start Date End Date Taking? Authorizing Provider  albuterol  (VENTOLIN  HFA) 108 (90 Base) MCG/ACT inhaler Inhale 2 puffs into the lungs every 6 (six) hours as needed for wheezing or shortness of breath. 09/30/23  Yes Justina Oman, MD  amLODipine  (NORVASC ) 10 MG tablet Take 1 tablet by mouth once daily 01/01/23  Yes Branch, Joyceann No, MD  aspirin  EC 81 MG tablet Take 81 mg by mouth daily.   Yes [provider]  carvedilol  (COREG ) 12.5 MG tablet Take 1 tablet by mouth twice daily Patient taking differently: Take 12.5 mg by mouth daily. 04/11/23  Yes BranchJoyceann No, MD  Cyanocobalamin  (VITAMIN B-12 PO) Take 1 tablet by mouth daily.   Yes [provider]  epoetin  alfa-epbx (RETACRIT ) 4000 UNIT/ML injection Inject 4,000 Units into the skin 2 (two) times a week. Friday   Yes [provider]  ezetimibe  (ZETIA ) 10 MG tablet Take 1 tablet (10 mg total) by mouth daily. 05/11/23  Yes Strader, Dimple Francis, PA-C  ferrous sulfate  325 (65 FE) MG tablet Take 325 mg by mouth daily with breakfast.   Yes [provider]  hydrALAZINE  (APRESOLINE ) 100 MG tablet Take 1 tablet (100 mg total) by mouth 2 (two) times  daily. 05/11/23  Yes Strader, [place] M, PA-C  insulin  degludec (TRESIBA  FLEXTOUCH) 100 UNIT/ML FlexTouch Pen Inject 24 Units into the skin at bedtime. Patient taking differently: Inject 24 Units into the skin daily. 07/02/23  Yes Nida, Gebreselassie W, MD  isosorbide  mononitrate (IMDUR ) 30 MG 24 hr tablet Take 1 tablet (30 mg total) by mouth daily. 12/29/20  Yes Nida, Gebreselassie W, MD  pantoprazole  (PROTONIX ) 40 MG tablet Take 1 tablet (40 mg total) by mouth daily. 09/30/23 09/29/24 Yes Justina Oman, MD  sodium bicarbonate  650 MG tablet Take 1 tablet (650 mg total) by mouth 2 (two) times daily. 09/30/23  Yes Justina Oman, MD  torsemide  (DEMADEX ) 20 MG tablet Take 3 tablets (60 mg total) by mouth daily. Patient taking differently: Take 20 mg by mouth 2 (two) times daily. 09/30/23  Yes Justina Oman, MD  Accu-Chek Softclix Lancets lancets Use to check blood glucose twice daily as instructed 09/21/23   Nida, Gebreselassie W, MD  Alcohol Swabs (B-D SINGLE USE SWABS REGULAR) PADS Use to check glucose twice daily as instructed 09/21/23   Nida, Gebreselassie W, MD  B-D ULTRAFINE III SHORT PEN 31G X 8 MM  MISC ISE 1 PEN NEEDLE ONCE DAILY AS DIRECTED 06/11/23   Nida, Gebreselassie W, MD  Blood Glucose Monitoring Suppl (ACCU-CHEK GUIDE ME) w/Device KIT Use to check blood glucose twice daily as directed. 09/21/23   Nida, Gebreselassie W, MD  glucose blood (ACCU-CHEK GUIDE TEST) test strip Use to check blood glucose twice daily as directed. 09/21/23   Baby Bolt, MD   Physical Exam: Vitals:   11/18/23 1145 11/18/23 1200 11/18/23 1330 11/18/23 1400  BP: (!) 148/64 (!) 167/69  (!) 164/80  Pulse: 65 78 75 75  Resp: (!) 24 (!) 29 18 18   Temp:    97.8 F (36.6 C)  TempSrc:    Oral  SpO2: 97% 96% 97% 97%  Weight:      Height:    5\' 4"  (1.626 m)    Wt Readings from Last 3 Encounters:  11/18/23 64.7 kg  09/28/23 64.7 kg  07/02/23 64.8 kg    General:  Appears calm and in no major distress at time  of evaluation.  Denies chest pain. Eyes: PERRL, normal lids & conjunctiva ENT: grossly normal hearing, lips & tongue. Neck: no LAD, masses or thyromegaly appreciated on exam. Cardiovascular: Rate controlled, no rubs, no gallops, no JVD on exam. Telemetry: SR, no arrhythmias appreciated on telemetry. Respiratory: Positive tachypnea, mild expiratory wheezing, bilateral bibasilar crackles appreciated on exam.  No using accessory muscles.  2-3 L nasal cannula supplementation in place.. Abdomen: soft, nontender, nondistended, positive bowel sounds. Skin: no petechiae. Musculoskeletal: grossly normal tone BUE/BLE Psychiatric: Stable mood, no agitation or hallucinations.  Able to follow commands appropriately. Neurologic: No focal neurologic deficits on exam.          Labs on Admission:  Basic Metabolic Panel: Recent Labs  Lab 11/18/23 0904  NA 138  K 3.0*  CL 107  CO2 17*  GLUCOSE 199*  BUN 39*  CREATININE 4.02*  CALCIUM  9.5  MG 2.0  PHOS 3.8   CBC: Recent Labs  Lab 11/18/23 0904  WBC 10.8*  NEUTROABS 9.6*  HGB 8.7*  HCT 26.5*  MCV 88.9  PLT 368   BNP (last 3 results) Recent Labs    11/18/23 0904  BNP 629.0*   CBG: Recent Labs  Lab 11/18/23 1421 11/18/23 1610  GLUCAP 141* 145*    Radiological Exams on Admission: DG Chest Portable 1 View Result Date: 11/18/2023 CLINICAL DATA:  SOB EXAM: PORTABLE CHEST - 1 VIEW COMPARISON:  09/28/2023. FINDINGS: Cardiac silhouette is prominent. There is pulmonary interstitial prominence with vascular congestion. No focal consolidation. No pneumothorax. Small left-sided pleural effusion. Calcified aorta. IMPRESSION: Findings suggest CHF. Electronically Signed   By: Sydell Eva M.D.   On: 11/18/2023 09:28    EKG: Independently reviewed.  No acute ischemic changes appreciated.  Assessment/Plan Principal Problem:   Acute hypoxemic respiratory failure (HCC)  1-acute hypoxemic respiratory failure secondary to diastolic heart  failure - Acute on chronic diastolic CHF - Patient's condition and treatment limited by his stage V chronic kidney disease. - Will admit to telemetry, follow daily weights, strict I's and O's and provide IV diuresis - Continue to wean oxygen  supplementation as tolerated - Follow clinical response. - Respiratory panel: Negative for COVID, influenza and RSV.  2-chronic kidney disease stage V/metabolic acidosis - Continue to follow renal function and continue the use of sodium bicarbonate  - Patient has expressed not wanting hemodialysis and understand terminal renal condition. - Palliative care has been consulted. Will  3-COPD - Positive wheezing at time of admission;  but most likely driven by CHF. - Will continue bronchodilator management - Holding on the steroid therapy at the moment.  4-type 2 diabetes with nephropathy - Continue sliding scale insulin  - Follow CBG fluctuation.  5-hypertension - With mild hypertensive crisis at time of admission - Continue Imdur , hydralazine , amlodipine , carvedilol  and Lasix  - Follow vital signs - Medication compliance discussed with patient.  6-GERD - Continue PPI.  7-hypokalemia - Potassium will be repleted - Follow electrolytes trend - Continue telemetry monitoring.  Code Status: DNR/DNI. DVT Prophylaxis: Heparin . Family Communication: No family at bedside. Disposition Plan: Anticipate discharge home with palliative care.  Time spent: 75 minutes  Justina Oman [facility] Pager [phone]

## 2023-11-18 NOTE — ED Provider Notes (Signed)
 [place] EMERGENCY DEPARTMENT AT [facility] Provider Note   CSN: [id number] Arrival date & time: 11/18/23  0800     History  Chief Complaint  Patient presents with   Shortness of Breath    Maria Parrish is a 78 y.o. female.  HPI    78 y.o. female with medical history significant of dementia, heart failure, COPD hyperlipidemia, hypertension, and T2DM presents to the emergency with chief complaint of shortness of breath.  Patient accompanied by family member.  According to the patient, she has been getting shortness of breath over the last 2 or 3 days.  Per family member at the bedside, they had to call EMS yesterday.  Patient however felt well enough with some treatments and they did not come to the hospital.  Patient did not take her BP medications since yesterday.  Per patient, last night she was unable to sleep because of her shortness of breath.  Her shortness of breath is worse when she lays flat.  She has been having some cough, that is mostly nonproductive, no fevers.  Per EMS, there was some wheezing and patient received breathing treatments.  Patient states that she has taken breathing treatments at home with transient relief.  Home Medications Prior to Admission medications   Medication Sig Start Date End Date Taking? Authorizing Provider  Accu-Chek Softclix Lancets lancets Use to check blood glucose twice daily as instructed 09/21/23   Baby Bolt, MD  albuterol  (VENTOLIN  HFA) 108 (90 Base) MCG/ACT inhaler Inhale 2 puffs into the lungs every 6 (six) hours as needed for wheezing or shortness of breath. 09/30/23   Justina Oman, MD  Alcohol Swabs (B-D SINGLE USE SWABS REGULAR) PADS Use to check glucose twice daily as instructed 09/21/23   Baby Bolt, MD  amLODipine  (NORVASC ) 10 MG tablet Take 1 tablet by mouth once daily 01/01/23   Laurann Pollock, MD  aspirin  EC 81 MG tablet Take 81 mg by mouth daily.    [provider]  Azelastine HCl  137 MCG/SPRAY SOLN Place into both nostrils. 08/21/21   [provider]  B-D ULTRAFINE III SHORT PEN 31G X 8 MM MISC ISE 1 PEN NEEDLE ONCE DAILY AS DIRECTED 06/11/23   Nida, Gebreselassie W, MD  Blood Glucose Monitoring Suppl (ACCU-CHEK GUIDE ME) w/Device KIT Use to check blood glucose twice daily as directed. 09/21/23   Nida, Gebreselassie W, MD  carvedilol  (COREG ) 12.5 MG tablet Take 1 tablet by mouth twice daily 04/11/23   Laurann Pollock, MD  Cyanocobalamin  (VITAMIN B-12 PO) Take 1 tablet by mouth daily.    [provider]  ezetimibe  (ZETIA ) 10 MG tablet Take 1 tablet (10 mg total) by mouth daily. 05/11/23   Strader, Dimple Francis, PA-C  ferrous sulfate  325 (65 FE) MG tablet Take 325 mg by mouth daily. Patient not taking: Reported on 05/11/2023 02/05/20   [provider]  glucose blood (ACCU-CHEK GUIDE TEST) test strip Use to check blood glucose twice daily as directed. 09/21/23   Nida, Gebreselassie W, MD  hydrALAZINE  (APRESOLINE ) 100 MG tablet Take 1 tablet (100 mg total) by mouth 2 (two) times daily. 05/11/23   Strader, Dimple Francis, PA-C  insulin  degludec (TRESIBA  FLEXTOUCH) 100 UNIT/ML FlexTouch Pen Inject 24 Units into the skin at bedtime. 07/02/23   Nida, Gebreselassie W, MD  isosorbide  mononitrate (IMDUR ) 30 MG 24 hr tablet Take 1 tablet (30 mg total) by mouth daily. Patient not taking: Reported on 07/02/2023 12/29/20  Nida, Gebreselassie W, MD  pantoprazole  (PROTONIX ) 40 MG tablet Take 1 tablet (40 mg total) by mouth daily. 09/30/23 09/29/24  Justina Oman, MD  predniSONE  (DELTASONE ) 20 MG tablet Take 3 tablets by mouth daily x 1 day; then 2 tablets by mouth daily x 2 days; then 1 tablet by mouth daily x 3 days; then half tablet by mouth daily x 3 days and stop prednisone . 09/30/23   Justina Oman, MD  sodium bicarbonate  650 MG tablet Take 1 tablet (650 mg total) by mouth 2 (two) times daily. 09/30/23   Justina Oman, MD  torsemide  (DEMADEX ) 20 MG tablet Take 3 tablets  (60 mg total) by mouth daily. 09/30/23   Justina Oman, MD      Allergies    Penicillins, Statins, and Statins support [acid blockers support]    Review of Systems   Review of Systems  All other systems reviewed and are negative.   Physical Exam Updated Vital Signs BP (!) 187/76   Pulse 73   Temp (!) 97.5 F (36.4 C) (Oral)   Resp (!) 23   Ht 5\' 5"  (1.651 m)   Wt 64.7 kg   SpO2 100%   BMI 23.74 kg/m  Physical Exam Vitals and nursing note reviewed.  Constitutional:      Appearance: She is well-developed.  HENT:     Head: Atraumatic.  Cardiovascular:     Rate and Rhythm: Normal rate.  Pulmonary:     Effort: Tachypnea and respiratory distress present.     Breath sounds: Examination of the right-middle field reveals rales. Examination of the left-middle field reveals rales. Examination of the right-lower field reveals decreased breath sounds. Examination of the left-lower field reveals decreased breath sounds. Decreased breath sounds and rales present. No wheezing.  Musculoskeletal:     Cervical back: Normal range of motion and neck supple.     Right lower leg: No edema.     Left lower leg: No edema.  Skin:    General: Skin is warm and dry.  Neurological:     Mental Status: She is alert and oriented to person, place, and time.     ED Results / Procedures / Treatments   Labs (all labs ordered are listed, but only abnormal results are displayed) Labs Reviewed  BASIC METABOLIC PANEL WITH GFR - Abnormal; Notable for the following components:      Result Value   Potassium 3.0 (*)    CO2 17 (*)    Glucose, Bld 199 (*)    BUN 39 (*)    Creatinine, Ser 4.02 (*)    GFR, Estimated 11 (*)    All other components within normal limits  BRAIN NATRIURETIC PEPTIDE - Abnormal; Notable for the following components:   B Natriuretic Peptide 629.0 (*)    All other components within normal limits  CBC WITH DIFFERENTIAL/PLATELET - Abnormal; Notable for the following components:    WBC 10.8 (*)    RBC 2.98 (*)    Hemoglobin 8.7 (*)    HCT 26.5 (*)    RDW 16.1 (*)    Neutro Abs 9.6 (*)    Lymphs Abs 0.6 (*)    All other components within normal limits  MAGNESIUM     EKG EKG Interpretation Date/Time:  Sunday Nov 18 2023 08:21:17 EDT Ventricular Rate:  84 PR Interval:  153 QRS Duration:  73 QT Interval:  403 QTC Calculation: 477 R Axis:   2  Text Interpretation: Sinus rhythm Low voltage, precordial leads Borderline repolarization  abnormality No acute changes No significant change since last tracing Confirmed by Deatra Face [phone]) on 11/18/2023 8:52:01 AM  Radiology DG Chest Portable 1 View Result Date: 11/18/2023 CLINICAL DATA:  SOB EXAM: PORTABLE CHEST - 1 VIEW COMPARISON:  09/28/2023. FINDINGS: Cardiac silhouette is prominent. There is pulmonary interstitial prominence with vascular congestion. No focal consolidation. No pneumothorax. Small left-sided pleural effusion. Calcified aorta. IMPRESSION: Findings suggest CHF. Electronically Signed   By: Sydell Eva M.D.   On: 11/18/2023 09:28    Procedures .Critical Care  Performed by: Deatra Face, MD Authorized by: Deatra Face, MD   Critical care provider statement:    Critical care time (minutes):  58   Critical care was necessary to treat or prevent imminent or life-threatening deterioration of the following conditions:  Respiratory failure, circulatory failure and cardiac failure   Critical care was time spent personally by me on the following activities:  Development of treatment plan with patient or surrogate, discussions with consultants, evaluation of patient's response to treatment, examination of patient, ordering and review of laboratory studies, ordering and review of radiographic studies, ordering and performing treatments and interventions, pulse oximetry, re-evaluation of patient's condition, review of old charts and obtaining history from patient or surrogate     Medications Ordered  in ED Medications  furosemide  (LASIX ) injection 80 mg (has no administration in time range)  hydrALAZINE  (APRESOLINE ) injection 10 mg (has no administration in time range)  nitroGLYCERIN  (NITROSTAT ) SL tablet 0.4 mg (has no administration in time range)  nitroGLYCERIN  (NITROSTAT ) SL tablet 0.4 mg (0.4 mg Sublingual Given 11/18/23 0901)  hydrALAZINE  (APRESOLINE ) tablet 100 mg (100 mg Oral Given 11/18/23 0902)  amLODipine  (NORVASC ) tablet 10 mg (10 mg Oral Given 11/18/23 0902)  potassium chloride  SA (KLOR-CON  M) CR tablet 40 mEq (40 mEq Oral Given 11/18/23 1023)    ED Course/ Medical Decision Making/ A&P Clinical Course as of 11/18/23 1039  Sun Nov 18, 2023  1036 Patient reassessed.  She is appearing a lot comfortable.  Her MAP has come down from 137-109 with oral sublingual nitro, oral hydralazine , oral amlodipine .  We will take her off of BiPAP now.  BNP is over 500.  X-ray of the chest reveals findings concerning for CHF acutely.  Plan is to wean her off of BiPAP now.  I will give her additional oral nitro and IV hydralazine .  We would like to avoid IV drip in her case given that most likely she just needs reinitiation of her home meds and home regimen, which can be bridged with as needed meds.  We will request admission at this time. [AN]  1037 Creatinine(!): 4.02 Creatinine is at baseline.  IV 80 mg Lasix  ordered given creatinine of 4. [AN]    Clinical Course User Index [AN] Deatra Face, MD                                 Medical Decision Making Amount and/or Complexity of Data Reviewed Labs: ordered. Radiology: ordered.  Risk Prescription drug management. Decision regarding hospitalization.   This patient presents to the ED with chief complaint(s) of shortness of breath with pertinent past medical history of CHF, COPD, CKD and hypertension.patient noted to be tachypneic, hypoxic upon arrival.The complaint involves an extensive differential diagnosis and also carries with it a  high risk of complications and morbidity.    The differential diagnosis includes : COPD exacerbation, CHF exacerbation, hypertensive urgency, ACS, pulmonary edema,  pleural effusion.  Low suspicion for pulmonary embolism or pneumonia.  The initial plan is to get basic labs, chest x-ray. Patient did not take her BP medications yesterday.  Her MAP is in the 120s.  Systolic blood pressure is 220. I have ordered patient's home medications and 1 sublingual nitroglycerin  right now.  We will place her on BiPAP.  I will reassess her thereafter and decide if she needs to go on drip for hypertensive emergency.  Additional history obtained: Additional history obtained from family Records reviewed previous admission documents  Independent labs interpretation:  The following labs were independently interpreted: Elevated BNP.  Normal white count.  Independent visualization and interpretation of imaging: - I independently visualized the following imaging with scope of interpretation limited to determining acute life threatening conditions related to emergency care: X-ray of the chest, which revealed pulmonary edema, and bilateral pleural effusion.  Effusion is new compared to the x-ray from a month ago.  Treatment and Reassessment: Patient reassessed.  Looks better now. BP also improved.  Will try to wean her off of BiPAP.    Final Clinical Impression(s) / ED Diagnoses Final diagnoses:  Acute hypoxic respiratory failure (HCC)  Acute systolic congestive heart failure [facility])    Rx / DC Orders ED Discharge Orders     None         Deatra Face, MD 11/18/23 1041

## 2023-11-19 ENCOUNTER — Encounter (HOSPITAL_COMMUNITY): Payer: Self-pay | Admitting: Internal Medicine

## 2023-11-19 DIAGNOSIS — Z66 Do not resuscitate: Secondary | ICD-10-CM | POA: Diagnosis present

## 2023-11-19 DIAGNOSIS — D631 Anemia in chronic kidney disease: Secondary | ICD-10-CM | POA: Diagnosis present

## 2023-11-19 DIAGNOSIS — I132 Hypertensive heart and chronic kidney disease with heart failure and with stage 5 chronic kidney disease, or end stage renal disease: Secondary | ICD-10-CM | POA: Diagnosis present

## 2023-11-19 DIAGNOSIS — Z833 Family history of diabetes mellitus: Secondary | ICD-10-CM | POA: Diagnosis not present

## 2023-11-19 DIAGNOSIS — Z7189 Other specified counseling: Secondary | ICD-10-CM | POA: Diagnosis not present

## 2023-11-19 DIAGNOSIS — Z794 Long term (current) use of insulin: Secondary | ICD-10-CM | POA: Diagnosis not present

## 2023-11-19 DIAGNOSIS — E785 Hyperlipidemia, unspecified: Secondary | ICD-10-CM | POA: Diagnosis present

## 2023-11-19 DIAGNOSIS — E1122 Type 2 diabetes mellitus with diabetic chronic kidney disease: Secondary | ICD-10-CM | POA: Diagnosis present

## 2023-11-19 DIAGNOSIS — Z8249 Family history of ischemic heart disease and other diseases of the circulatory system: Secondary | ICD-10-CM | POA: Diagnosis not present

## 2023-11-19 DIAGNOSIS — Z515 Encounter for palliative care: Secondary | ICD-10-CM | POA: Diagnosis not present

## 2023-11-19 DIAGNOSIS — E1142 Type 2 diabetes mellitus with diabetic polyneuropathy: Secondary | ICD-10-CM | POA: Diagnosis present

## 2023-11-19 DIAGNOSIS — E876 Hypokalemia: Secondary | ICD-10-CM | POA: Diagnosis present

## 2023-11-19 DIAGNOSIS — Z1152 Encounter for screening for COVID-19: Secondary | ICD-10-CM | POA: Diagnosis not present

## 2023-11-19 DIAGNOSIS — E213 Hyperparathyroidism, unspecified: Secondary | ICD-10-CM | POA: Diagnosis present

## 2023-11-19 DIAGNOSIS — F0390 Unspecified dementia without behavioral disturbance: Secondary | ICD-10-CM | POA: Diagnosis present

## 2023-11-19 DIAGNOSIS — J449 Chronic obstructive pulmonary disease, unspecified: Secondary | ICD-10-CM | POA: Diagnosis present

## 2023-11-19 DIAGNOSIS — I5033 Acute on chronic diastolic (congestive) heart failure: Secondary | ICD-10-CM | POA: Diagnosis present

## 2023-11-19 DIAGNOSIS — K219 Gastro-esophageal reflux disease without esophagitis: Secondary | ICD-10-CM | POA: Diagnosis present

## 2023-11-19 DIAGNOSIS — J9601 Acute respiratory failure with hypoxia: Secondary | ICD-10-CM | POA: Diagnosis present

## 2023-11-19 DIAGNOSIS — I169 Hypertensive crisis, unspecified: Secondary | ICD-10-CM | POA: Diagnosis present

## 2023-11-19 DIAGNOSIS — E872 Acidosis, unspecified: Secondary | ICD-10-CM | POA: Diagnosis present

## 2023-11-19 DIAGNOSIS — N185 Chronic kidney disease, stage 5: Secondary | ICD-10-CM | POA: Diagnosis present

## 2023-11-19 DIAGNOSIS — Z7982 Long term (current) use of aspirin: Secondary | ICD-10-CM | POA: Diagnosis not present

## 2023-11-19 DIAGNOSIS — Z79899 Other long term (current) drug therapy: Secondary | ICD-10-CM | POA: Diagnosis not present

## 2023-11-19 LAB — GLUCOSE, CAPILLARY
Glucose-Capillary: 140 mg/dL — ABNORMAL HIGH (ref 70–99)
Glucose-Capillary: 178 mg/dL — ABNORMAL HIGH (ref 70–99)
Glucose-Capillary: 127 mg/dL — ABNORMAL HIGH (ref 70–99)
Glucose-Capillary: 230 mg/dL — ABNORMAL HIGH (ref 70–99)

## 2023-11-19 LAB — BASIC METABOLIC PANEL WITH GFR
Anion gap: 9 (ref 5–15)
BUN: 39 mg/dL — ABNORMAL HIGH (ref 8–23)
CO2: 20 mmol/L — ABNORMAL LOW (ref 22–32)
Calcium: 8.7 mg/dL — ABNORMAL LOW (ref 8.9–10.3)
Chloride: 110 mmol/L (ref 98–111)
Creatinine, Ser: 4.04 mg/dL — ABNORMAL HIGH (ref 0.44–1.00)
GFR, Estimated: 11 mL/min — ABNORMAL LOW (ref >60)
Glucose, Bld: 132 mg/dL — ABNORMAL HIGH (ref 70–99)
Potassium: 3.3 mmol/L — ABNORMAL LOW (ref 3.5–5.1)
Sodium: 139 mmol/L (ref 135–145)

## 2023-11-19 LAB — MAGNESIUM: Magnesium: 1.8 mg/dL (ref 1.7–2.4)

## 2023-11-19 LAB — CBC
HCT: 20.4 % — ABNORMAL LOW (ref 36.0–46.0)
HCT: 21.2 % — ABNORMAL LOW (ref 36.0–46.0)
Hemoglobin: 6.7 g/dL — CL (ref 12.0–15.0)
Hemoglobin: 6.8 g/dL — CL (ref 12.0–15.0)
MCH: 29.1 pg (ref 26.0–34.0)
MCH: 28.8 pg (ref 26.0–34.0)
MCHC: 32.8 g/dL (ref 30.0–36.0)
MCHC: 32.1 g/dL (ref 30.0–36.0)
MCV: 88.7 fL (ref 80.0–100.0)
MCV: 89.8 fL (ref 80.0–100.0)
Platelets: 314 10*3/uL (ref 150–400)
Platelets: 288 10*3/uL (ref 150–400)
RBC: 2.3 MIL/uL — ABNORMAL LOW (ref 3.87–5.11)
RBC: 2.36 MIL/uL — ABNORMAL LOW (ref 3.87–5.11)
RDW: 16.1 % — ABNORMAL HIGH (ref 11.5–15.5)
RDW: 16.3 % — ABNORMAL HIGH (ref 11.5–15.5)
WBC: 7.6 10*3/uL (ref 4.0–10.5)
WBC: 7.2 10*3/uL (ref 4.0–10.5)
nRBC: 0 % (ref 0.0–0.2)
nRBC: 0 % (ref 0.0–0.2)

## 2023-11-19 LAB — RETICULOCYTES
Immature Retic Fract: 9.3 % (ref 2.3–15.9)
RBC.: 2.23 MIL/uL — ABNORMAL LOW (ref 3.87–5.11)
Retic Count, Absolute: 32.8 10*3/uL (ref 19.0–186.0)
Retic Ct Pct: 1.5 % (ref 0.4–3.1)

## 2023-11-19 LAB — IRON AND TIBC
Iron: 36 ug/dL (ref 28–170)
Saturation Ratios: 23 % (ref 10.4–31.8)
TIBC: 156 ug/dL — ABNORMAL LOW (ref 250–450)
UIBC: 120 ug/dL

## 2023-11-19 LAB — FOLATE: Folate: 14.1 ng/mL (ref >5.9)

## 2023-11-19 LAB — VITAMIN B12: Vitamin B-12: 1950 pg/mL — ABNORMAL HIGH (ref 180–914)

## 2023-11-19 LAB — ABO/RH: ABO/RH(D): O POS

## 2023-11-19 LAB — FERRITIN: Ferritin: 397 ng/mL — ABNORMAL HIGH (ref 11–307)

## 2023-11-19 LAB — PREPARE RBC (CROSSMATCH)

## 2023-11-19 MED ORDER — POTASSIUM CHLORIDE CRYS ER 20 MEQ PO TBCR
40.0000 meq | EXTENDED_RELEASE_TABLET | Freq: Once | ORAL | Status: AC
  Administered 2023-11-19: 40 meq via ORAL
  Filled 2023-11-19: qty 2

## 2023-11-19 MED ORDER — ORAL CARE MOUTH RINSE: 15.0000 mL | OROMUCOSAL | Status: DC | PRN

## 2023-11-19 MED ORDER — SODIUM CHLORIDE 0.9% IV SOLUTION: Freq: Once | INTRAVENOUS | Status: DC

## 2023-11-19 MED ORDER — FUROSEMIDE 10 MG/ML IJ SOLN
80.0000 mg | Freq: Two times a day (BID) | INTRAMUSCULAR | Status: DC
  Administered 2023-11-19 – 2023-11-20 (×2): 80 mg via INTRAVENOUS
  Filled 2023-11-19 (×2): qty 8

## 2023-11-19 NOTE — Progress Notes (Signed)
 PROGRESS NOTE    Maria Parrish  ZOX:[id number] DOB: Mar 03, 1946 DOA: 11/18/2023 PCP: Ayesha Lente, FNP   Brief Narrative:    Maria Parrish is a 78 y.o. female with past medical history significant for chronic diastolic heart failure, hyperlipidemia, history of COPD, hypertension, type 2 diabetes mellitus with nephropathy and chronic kidney disease stage V; who presented to the hospital secondary to increased shortness of breath and orthopnea.  Patient was admitted with acute hypoxemic respiratory failure secondary to diastolic CHF exacerbation in the setting of worsening CKD stage V.  She has been started on diuresis and does not desire hemodialysis.  Palliative care consulted.  Assessment & Plan:   Principal Problem:   Acute hypoxemic respiratory failure (HCC)  Assessment and Plan:    1-acute hypoxemic respiratory failure secondary to diastolic heart failure - Acute on chronic diastolic CHF - Patient's condition and treatment limited by his stage V chronic kidney disease. - Will admit to telemetry, follow daily weights, strict I's and O's and provide IV diuresis - Continue to wean oxygen  supplementation as tolerated - Follow clinical response. - Respiratory panel: Negative for COVID, influenza and RSV.   2-chronic kidney disease stage V/metabolic acidosis - Continue to follow renal function and continue the use of sodium bicarbonate  - Patient has expressed not wanting hemodialysis and understand terminal renal condition. - Palliative care has been consulted-appears to require hospice care.   3-COPD - Positive wheezing at time of admission; but most likely driven by CHF. - Will continue bronchodilator management - Holding on the steroid therapy at the moment.   4-type 2 diabetes with nephropathy - Continue sliding scale insulin  - Follow CBG fluctuation.   5-hypertension - With mild hypertensive crisis at time of admission - Continue Imdur , hydralazine , amlodipine , carvedilol   and Lasix  - Follow vital signs - Medication compliance discussed with patient.   6-GERD - Continue PPI.   7-hypokalemia - Potassium will be repleted - Follow electrolytes trend - Continue telemetry monitoring.  8-worsening anemia - Hold heparin  agents - Continue to monitor with repeat hemoglobin levels - Hold transfusion until further discussion had with palliative care, no significant addition to quality of life at this point and would make volume situation worse. - Monitor CBC   DVT prophylaxis:SCDs Code Status: DNR Family Communication: Son on phone 5/5 Disposition Plan:  Status is: Observation The patient will require care spanning > 2 midnights and should be moved to inpatient because: Need for IV medications and close evaluation.   Consultants:  Palliative care  Procedures:  None  Antimicrobials:  None   Subjective: Patient seen and evaluated today and is quite somnolent and poorly responsive to questioning.  No acute overnight events noted.  Hemoglobin trend is downwards.  Objective: Vitals:   11/19/23 0156 11/19/23 0529 11/19/23 0531 11/19/23 0632  BP: (!) 158/64  (!) 180/73 (!) 151/66  Pulse: 63  66   Resp: (!) 24  17 18   Temp: 98.5 F (36.9 C)  98 F (36.7 C)   TempSrc: Oral  Oral   SpO2: 99%  97%   Weight:  66.9 kg    Height:        Intake/Output Summary (Last 24 hours) at 11/19/2023 1021 Last data filed at 11/19/2023 0619 Gross per 24 hour  Intake 243 ml  Output 500 ml  Net -257 ml   Filed Weights   11/18/23 0826 11/19/23 0529  Weight: 64.7 kg 66.9 kg    Examination:  General exam: Appears calm and  comfortable  Respiratory system: Clear to auscultation. Respiratory effort normal. Cardiovascular system: S1 & S2 heard, RRR.  Gastrointestinal system: Abdomen is soft Central nervous system: Alert and awake Extremities: No edema Skin: No significant lesions noted Psychiatry: Flat affect.    Data Reviewed: I have personally reviewed  following labs and imaging studies  CBC: Recent Labs  Lab 11/18/23 0904 11/19/23 0454 11/19/23 0823  WBC 10.8* 7.6 7.2  NEUTROABS 9.6*  --   --   HGB 8.7* 6.7* 6.8*  HCT 26.5* 20.4* 21.2*  MCV 88.9 88.7 89.8  PLT 368 314 288   Basic Metabolic Panel: Recent Labs  Lab 11/18/23 0904 11/19/23 0454  NA 138 139  K 3.0* 3.3*  CL 107 110  CO2 17* 20*  GLUCOSE 199* 132*  BUN 39* 39*  CREATININE 4.02* 4.04*  CALCIUM  9.5 8.7*  MG 2.0 1.8  PHOS 3.8  --    GFR: Estimated Creatinine Clearance: 11 mL/min (A) (by C-G formula based on SCr of 4.04 mg/dL (H)). Liver Function Tests: No results for input(s): "AST", "ALT", "ALKPHOS", "BILITOT", "PROT", "ALBUMIN" in the last 168 hours. No results for input(s): "LIPASE", "AMYLASE" in the last 168 hours. No results for input(s): "AMMONIA" in the last 168 hours. Coagulation Profile: No results for input(s): "INR", "PROTIME" in the last 168 hours. Cardiac Enzymes: No results for input(s): "CKTOTAL", "CKMB", "CKMBINDEX", "TROPONINI" in the last 168 hours. BNP (last 3 results) No results for input(s): "PROBNP" in the last 8760 hours. HbA1C: No results for input(s): "HGBA1C" in the last 72 hours. CBG: Recent Labs  Lab 11/18/23 1421 11/18/23 1610 11/18/23 2147 11/19/23 0745  GLUCAP 141* 145* 140* 140*   Lipid Profile: No results for input(s): "CHOL", "HDL", "LDLCALC", "TRIG", "CHOLHDL", "LDLDIRECT" in the last 72 hours. Thyroid  Function Tests: No results for input(s): "TSH", "T4TOTAL", "FREET4", "T3FREE", "THYROIDAB" in the last 72 hours. Anemia Panel: No results for input(s): "VITAMINB12", "FOLATE", "FERRITIN", "TIBC", "IRON", "RETICCTPCT" in the last 72 hours. Sepsis Labs: No results for input(s): "PROCALCITON", "LATICACIDVEN" in the last 168 hours.  Recent Results (from the past 240 hours)  Resp panel by RT-PCR (RSV, Flu A&B, Covid) Anterior Nasal Swab     Status: None   Collection Time: 11/18/23  2:12 PM   Specimen: Anterior  Nasal Swab  Result Value Ref Range Status   SARS Coronavirus 2 by RT PCR NEGATIVE NEGATIVE Final    Comment: (NOTE) SARS-CoV-2 target nucleic acids are NOT DETECTED.  The SARS-CoV-2 RNA is generally detectable in upper respiratory specimens during the acute phase of infection. The lowest concentration of SARS-CoV-2 viral copies this assay can detect is 138 copies/mL. A negative result does not preclude SARS-Cov-2 infection and should not be used as the sole basis for treatment or other patient management decisions. A negative result may occur with  improper specimen collection/handling, submission of specimen other than nasopharyngeal swab, presence of viral mutation(s) within the areas targeted by this assay, and inadequate number of viral copies(<138 copies/mL). A negative result must be combined with clinical observations, patient history, and epidemiological information. The expected result is Negative.  Fact Sheet for Patients:  BloggerCourse.com  Fact Sheet for Healthcare Providers:  SeriousBroker.it  This test is no t yet approved or cleared by the [place]  FDA and  has been authorized for detection and/or diagnosis of SARS-CoV-2 by FDA under an Emergency Use Authorization (EUA). This EUA will remain  in effect (meaning this test can be used) for the duration of the COVID-19 declaration  under Section 564(b)(1) of the Act, 21 U.S.C.section 360bbb-3(b)(1), unless the authorization is terminated  or revoked sooner.       Influenza A by PCR NEGATIVE NEGATIVE Final   Influenza B by PCR NEGATIVE NEGATIVE Final    Comment: (NOTE) The Xpert Xpress SARS-CoV-2/FLU/RSV plus assay is intended as an aid in the diagnosis of influenza from Nasopharyngeal swab specimens and should not be used as a sole basis for treatment. Nasal washings and aspirates are unacceptable for Xpert Xpress SARS-CoV-2/FLU/RSV testing.  Fact Sheet for  Patients: BloggerCourse.com  Fact Sheet for Healthcare Providers: SeriousBroker.it  This test is not yet approved or cleared by the [place]  FDA and has been authorized for detection and/or diagnosis of SARS-CoV-2 by FDA under an Emergency Use Authorization (EUA). This EUA will remain in effect (meaning this test can be used) for the duration of the COVID-19 declaration under Section 564(b)(1) of the Act, 21 U.S.C. section 360bbb-3(b)(1), unless the authorization is terminated or revoked.     Resp Syncytial Virus by PCR NEGATIVE NEGATIVE Final    Comment: (NOTE) Fact Sheet for Patients: BloggerCourse.com  Fact Sheet for Healthcare Providers: SeriousBroker.it  This test is not yet approved or cleared by the [place]  FDA and has been authorized for detection and/or diagnosis of SARS-CoV-2 by FDA under an Emergency Use Authorization (EUA). This EUA will remain in effect (meaning this test can be used) for the duration of the COVID-19 declaration under Section 564(b)(1) of the Act, 21 U.S.C. section 360bbb-3(b)(1), unless the authorization is terminated or revoked.  Performed at [facility], [address] [place], [place] [address]          Radiology Studies: DG Chest Portable 1 View Result Date: 11/18/2023 CLINICAL DATA:  SOB EXAM: PORTABLE CHEST - 1 VIEW COMPARISON:  09/28/2023. FINDINGS: Cardiac silhouette is prominent. There is pulmonary interstitial prominence with vascular congestion. No focal consolidation. No pneumothorax. Small left-sided pleural effusion. Calcified aorta. IMPRESSION: Findings suggest CHF. Electronically Signed   By: Sydell Eva M.D.   On: 11/18/2023 09:28        Scheduled Meds:  amLODipine   10 mg Oral Daily   carvedilol   12.5 mg Oral BID   ezetimibe   10 mg Oral Daily   furosemide   40 mg Intravenous Q12H   hydrALAZINE   50 mg Oral  Q8H   insulin  aspart  0-5 Units Subcutaneous QHS   insulin  aspart  0-9 Units Subcutaneous TID WC   isosorbide  mononitrate  30 mg Oral Daily   pantoprazole   40 mg Oral Daily   sodium bicarbonate   650 mg Oral BID   sodium chloride  flush  3 mL Intravenous Q12H   Continuous Infusions:  sodium chloride        LOS: 0 days    Time spent: 55 minutes    Lebert Lovern Loran Rock, DO [facility]  If 7PM-7AM, please contact night-coverage [email or web address] 11/19/2023, 10:21 AM

## 2023-11-19 NOTE — Progress Notes (Signed)
 Heart Failure Navigator Progress Note  Assessed for Heart & Vascular TOC clinic readiness. Patient presented to the hospital secondary to increased shortness of breath and orthopnea.  Admitted with acute hypoxemic respiratory failure secondary to diastolic CHF exacerbation in the setting of worsening CKD stage V. She does not desire hemodialysis. Medical history significant of dementia. Palliative care consulted. Patient is a DNR status.  Navigator available for reassessment of patient but will sign of at this time.  Celedonio Coil, RN, BSN [facility] Heart Failure Navigator Secure Chat Only

## 2023-11-19 NOTE — Plan of Care (Signed)
 Hgb 6.7 this morning. On call provider made aware of critical low value, provider instructed this RN to place a repeat lab order and hold AM heparin  dose.   No acute s/s of bleeding noted this shift. No BM overnight. Urine yellow and clear.   Problem: Education: Goal: Knowledge of General Education information will improve Description: Including pain rating scale, medication(s)/side effects and non-pharmacologic comfort measures Outcome: Progressing   Problem: Health Behavior/Discharge Planning: Goal: Ability to manage health-related needs will improve Outcome: Progressing   Problem: Clinical Measurements: Goal: Ability to maintain clinical measurements within normal limits will improve Outcome: Progressing   Problem: Activity: Goal: Risk for activity intolerance will decrease Outcome: Progressing   Problem: Nutrition: Goal: Adequate nutrition will be maintained Outcome: Progressing

## 2023-11-19 NOTE — Consult Note (Signed)
 Consultation Note Date: 11/19/2023   Patient Name: Maria Parrish  DOB: 1946/07/15  MRN: [id number]  Age / Sex: 78 y.o., female  PCP: Ayesha Lente, FNP Referring Physician: Cornelius Dill, DO  Reason for Consultation: Establishing goals of care  HPI/Patient Profile: 78 y.o. female  with past medical history of chronic diastolic heart failure, hyperlipidemia, history of COPD, hypertension, type 2 diabetes mellitus with nephropathy and chronic kidney disease stage V admitted on 11/18/2023 with acute hypoxic respiratory failure.   Clinical Assessment and Goals of Care: Face-to-face conference with attending and bedside nursing staff related to patient condition, needs.  I have reviewed medical records including EPIC notes, labs and imaging, received report from RN, assessed the patient.  Maria Parrish is resting quietly in bed.  She appears chronically ill and somewhat frail.  She greets me, making and keeping eye contact.  She is alert and oriented x 3, able to make her needs known.  There is no family at bedside at this time.  We meet at the bedside to discuss diagnosis prognosis, GOC, EOL wishes, disposition and options.  I introduced Palliative Medicine as specialized medical care for people living with serious illness. It focuses on providing relief from the symptoms and stress of a serious illness. The goal is to improve quality of life for both the patient and the family.  We discussed a brief life review of the patient.  Maria Parrish tells me that she is a widow.  She has 2 children, sons Maria Parrish and Maria Parrish.  She lives in her son Maria Parrish's home.  She has been independent with ADLs and does do some light housework around the house.  She tells me that she lives in the [facility] community.  We talked about her chronic kidney disease and she shares that she sees her trusted nephrologist, Dr. Carrolyn Clan.   We then focused on  their current illness.  We talked about her respiratory issues and the treatment plan.  We talked about her chronic kidney disease.  We talked about her low blood.  Maria Parrish that she has never received blood, but at this point is open to receiving blood if needed.  We talked about time for outcomes.  The natural disease trajectory and expectations at EOL were discussed.  Advanced directives, concepts specific to code status, artifical feeding and hydration, and rehospitalization were considered and discussed.  Maria Parrish Parrish that she would not want attempted resuscitation or life support.  She shares that her sons can abide her wish.  Discussed the importance of continued conversation with family and the medical providers regarding overall plan of care and treatment options, ensuring decisions are within the context of the patient's values and GOCs.  Questions and concerns were addressed.  Hard Choices booklet left for review. The family was encouraged to call with questions or concerns.  PMT will continue to support holistically.  Conference with attending, bedside nursing staff, transition of care team related to patient condition, needs, goals of care, disposition.  Call to son, Maria Parrish.  No answer, unable to leave voicemail message. Call to son, Maria Parrish.  No answer, unable to leave voicemail message.  Call to son, Maria Parrish.  Left voicemail message.  Check, not checked since did not give Maria Parrish this order 1 mg today just does not feel like it   HCPOA  NEXT OF KIN -Maria Parrish names her 2 sons, Maria Parrish and Maria Parrish as her healthcare surrogates.    SUMMARY OF RECOMMENDATIONS   Continue to treat the treatable but no CPR or intubation Agreeable to receive blood. Ultimate goal is to return home    Code Status/Advance Care Planning: DNR -verified with patient  Symptom Management:  Per hospitalist, no additional needs at this time.  Palliative Prophylaxis:   Frequent Pain Assessment and Oral Care  Additional Recommendations (Limitations, Scope, Preferences): Continue to treatment no CPR or intubation  Psycho-social/Spiritual:  Desire for further Chaplaincy support:no Additional Recommendations: Caregiving  Support/Resources  Prognosis:  Unable to determine, based on outcomes.  1 year or less would not be surprising based on chronic illness burden, CKD stage V.   Discharge Planning: To Be Determined      Primary Diagnoses: Present on Admission:  Acute hypoxemic respiratory failure (HCC)   I have reviewed the medical record, interviewed the patient and family, and examined the patient. The following aspects are pertinent.  Past Medical History:  Diagnosis Date   Assistance needed for mobility    walks with walker   Confusion    Dementia (HCC)    Diabetes mellitus without complication (HCC)    Generalized weakness    Heart failure with improved ejection fraction (HFimpEF) (HCC)    a. EF 40-45% by echo in 04/2020 b. normalized to 55-60% by echo in 06/2021   Hypercalcemia    Hyperlipidemia    Hyperparathyroidism (HCC)    Hypertension    Hypertension    Moderate protein-calorie malnutrition (HCC)    Peripheral neuropathy    UTI (lower urinary tract infection)    Social History   Socioeconomic History   Marital status: Widowed    Spouse name: Not on file   Number of children: 2   Years of education: Not on file   Highest education level: Not on file  Occupational History   Occupation: fabricator for [employer]  Tobacco Use   Smoking status: Never   Smokeless tobacco: Never  Vaping Use   Vaping status: Never Used  Substance and Sexual Activity   Alcohol use: No   Drug use: No   Sexual activity: Not Currently  Other Topics Concern   Not on file  Social History Narrative   Not on file   Social Drivers of Health   Financial Resource Strain: Not on file  Food Insecurity: No Food Insecurity (11/18/2023)   Hunger Vital  Sign    Worried About Running Out of Food in the Last Year: Never true    Ran Out of Food in the Last Year: Never true  Transportation Needs: No Transportation Needs (11/18/2023)   PRAPARE - Administrator, Civil Service (Medical): No    Lack of Transportation (Non-Medical): No  Physical Activity: Inactive (08/20/2020)   Exercise Vital Sign    Days of Exercise per Week: 0 days    Minutes of Exercise per Session: 0 min  Stress: Not on file  Social Connections: Moderately Integrated (11/18/2023)   Social Connection and Isolation Panel [NHANES]    Frequency of Communication with Friends  and Family: More than three times a week    Frequency of Social Gatherings with Friends and Family: Once a week    Attends Religious Services: 1 to 4 times per year    Active Member of [employer] or Organizations: Yes    Attends Banker Meetings: 1 to 4 times per year    Marital Status: Widowed   Family History  Problem Relation Age of Onset   Heart attack Mother    Diabetes Father    Hypertension Brother    Hypertension Brother    Diabetes Brother    Colon cancer Neg Hx    Scheduled Meds:  amLODipine   10 mg Oral Daily   carvedilol   12.5 mg Oral BID   ezetimibe   10 mg Oral Daily   furosemide   40 mg Intravenous Q12H   hydrALAZINE   50 mg Oral Q8H   insulin  aspart  0-5 Units Subcutaneous QHS   insulin  aspart  0-9 Units Subcutaneous TID WC   isosorbide  mononitrate  30 mg Oral Daily   pantoprazole   40 mg Oral Daily   potassium chloride   40 mEq Oral Once   sodium bicarbonate   650 mg Oral BID   sodium chloride  flush  3 mL Intravenous Q12H   Continuous Infusions:  sodium chloride      PRN Meds:.sodium chloride , acetaminophen  **OR** acetaminophen , ondansetron  **OR** ondansetron  (ZOFRAN ) IV, mouth rinse, sodium chloride  flush Medications Prior to Admission:  Prior to Admission medications   Medication Sig Start Date End Date Taking? Authorizing Provider  albuterol  (VENTOLIN  HFA) 108  (90 Base) MCG/ACT inhaler Inhale 2 puffs into the lungs every 6 (six) hours as needed for wheezing or shortness of breath. 09/30/23  Yes Justina Oman, MD  amLODipine  (NORVASC ) 10 MG tablet Take 1 tablet by mouth once daily 01/01/23  Yes Branch, Joyceann No, MD  aspirin  EC 81 MG tablet Take 81 mg by mouth daily.   Yes [provider]  carvedilol  (COREG ) 12.5 MG tablet Take 1 tablet by mouth twice daily Patient taking differently: Take 12.5 mg by mouth daily. 04/11/23  Yes BranchJoyceann No, MD  Cyanocobalamin  (VITAMIN B-12 PO) Take 1 tablet by mouth daily.   Yes [provider]  epoetin  alfa-epbx (RETACRIT ) 4000 UNIT/ML injection Inject 4,000 Units into the skin 2 (two) times a week. Friday   Yes [provider]  ezetimibe  (ZETIA ) 10 MG tablet Take 1 tablet (10 mg total) by mouth daily. 05/11/23  Yes Strader, Dimple Francis, PA-C  ferrous sulfate  325 (65 FE) MG tablet Take 325 mg by mouth daily with breakfast.   Yes [provider]  hydrALAZINE  (APRESOLINE ) 100 MG tablet Take 1 tablet (100 mg total) by mouth 2 (two) times daily. 05/11/23  Yes Strader, [place] M, PA-C  insulin  degludec (TRESIBA  FLEXTOUCH) 100 UNIT/ML FlexTouch Pen Inject 24 Units into the skin at bedtime. Patient taking differently: Inject 24 Units into the skin daily. 07/02/23  Yes Nida, Gebreselassie W, MD  isosorbide  mononitrate (IMDUR ) 30 MG 24 hr tablet Take 1 tablet (30 mg total) by mouth daily. 12/29/20  Yes Nida, Gebreselassie W, MD  pantoprazole  (PROTONIX ) 40 MG tablet Take 1 tablet (40 mg total) by mouth daily. 09/30/23 09/29/24 Yes Justina Oman, MD  sodium bicarbonate  650 MG tablet Take 1 tablet (650 mg total) by mouth 2 (two) times daily. 09/30/23  Yes Justina Oman, MD  torsemide  (DEMADEX ) 20 MG tablet Take 3 tablets (60 mg total) by mouth daily. Patient taking differently: Take 20 mg by mouth  2 (two) times daily. 09/30/23  Yes Justina Oman, MD  Accu-Chek Softclix Lancets lancets Use to  check blood glucose twice daily as instructed 09/21/23   Nida, Gebreselassie W, MD  Alcohol Swabs (B-D SINGLE USE SWABS REGULAR) PADS Use to check glucose twice daily as instructed 09/21/23   Nida, Gebreselassie W, MD  B-D ULTRAFINE III SHORT PEN 31G X 8 MM MISC ISE 1 PEN NEEDLE ONCE DAILY AS DIRECTED 06/11/23   Nida, Gebreselassie W, MD  Blood Glucose Monitoring Suppl (ACCU-CHEK GUIDE ME) w/Device KIT Use to check blood glucose twice daily as directed. 09/21/23   Nida, Gebreselassie W, MD  glucose blood (ACCU-CHEK GUIDE TEST) test strip Use to check blood glucose twice daily as directed. 09/21/23   Baby Bolt, MD   Allergies  Allergen Reactions   Statins Other (See Comments)    Makes wrists and fingers ache Muscle Pain and Joint Pain   Penicillins Other (See Comments)    Makes patient sore.   Review of Systems  Unable to perform ROS: Acuity of condition    Physical Exam Vitals and nursing note reviewed.     Vital Signs: BP (!) 151/66 (BP Location: Right Arm)   Pulse 66   Temp 98 F (36.7 C) (Oral)   Resp 18   Ht 5\' 4"  (1.626 m)   Wt 66.9 kg   SpO2 97%   BMI 25.32 kg/m  Pain Scale: 0-10   Pain Score: 0-No pain   SpO2: SpO2: 97 % O2 Device:SpO2: 97 % O2 Flow Rate: .O2 Flow Rate (L/min): 2.5 L/min  IO: Intake/output summary:  Intake/Output Summary (Last 24 hours) at 11/19/2023 0920 Last data filed at 11/19/2023 4098 Gross per 24 hour  Intake 243 ml  Output 500 ml  Net -257 ml    LBM: Last BM Date : 11/17/23 (per pt report) Baseline Weight: Weight: 64.7 kg Most recent weight: Weight: 66.9 kg     Palliative Assessment/Data:     Time In: 1420  Time Out: 1515 Time Total: 55 minutes  Greater than 50%  of this time was spent counseling and coordinating care related to the above assessment and plan.  Signed by: Annabelle Barrack, NP   Please contact Palliative Medicine Team phone at [phone] for questions and concerns.  For individual provider: See Tilford Foley

## 2023-11-19 NOTE — TOC Initial Note (Signed)
 Transition of Care [facility]) - Initial/Assessment Note    Patient Details  Name: Maria Parrish MRN: [id number] Date of Birth: 1945-09-14  Transition of Care [facility]) CM/SW Contact:    Orelia Binet, RN Phone Number: 11/19/2023, 3:15 PM  Clinical Narrative:   Patient admitted with acute hypoxemic respiratory failure. Patient has a high risk for readmission score. Palliative consulted, unsure if they will get to assess today. Palliative did give TOC report that patient lives with her son, and she is somewhat independent with assistance, she does light house work and cooking.  CHF education added to AVS. TOC following for discharge needs.                  Barriers to Discharge: Continued Medical Work up, Other (must enter comment) (Palliative Consult)   Patient Goals and CMS Choice Patient states their goals for this hospitalization and ongoing recovery are:: palliative to meet with patient and family       Living arrangements for the past 2 months: Single Family Home      Prior Living Arrangements/Services Living arrangements for the past 2 months: Single Family Home Lives with:: Adult Children Patient language and need for interpreter reviewed:: Yes        Need for Family Participation in Patient Care: No (Comment) Care giver support system in place?: No (comment)   Criminal Activity/Legal Involvement Pertinent to Current Situation/Hospitalization: No - Comment as needed  Activities of Daily Living   ADL Screening (condition at time of admission) Independently performs ADLs?: No Does the patient have a NEW difficulty with bathing/dressing/toileting/self-feeding that is expected to last >3 days?: No Does the patient have a NEW difficulty with getting in/out of bed, walking, or climbing stairs that is expected to last >3 days?: Yes (Initiates electronic notice to provider for possible PT consult) Does the patient have a NEW difficulty with communication that is expected to last >3 days?:  No Is the patient deaf or have difficulty hearing?: No Does the patient have difficulty seeing, even when wearing glasses/contacts?: No Does the patient have difficulty concentrating, remembering, or making decisions?: No  Permission Sought/Granted            Permission granted to share info w Relationship: son     Emotional Assessment       Orientation: : Oriented to Self, Oriented to Place Alcohol / Substance Use: Not Applicable Psych Involvement: No (comment)  Admission diagnosis:  Acute systolic congestive heart failure (HCC) [I50.21] Acute hypoxemic respiratory failure (HCC) [J96.01] Acute hypoxic respiratory failure (HCC) [J96.01] Patient Active Problem List   Diagnosis Date Noted   Acute hypoxemic respiratory failure (HCC) 09/28/2023   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) 02/27/2023   Insulin  long-term use (HCC) 02/27/2023   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) 06/15/2022   Statin intolerance 01/03/2022   Peripheral arterial disease (HCC) 09/07/2020   Chronic kidney disease 05/27/2020   Acute on chronic diastolic (congestive) heart failure (HCC) 05/13/2020   Acute on chronic diastolic congestive heart failure (HCC) 05/12/2020   Elevated serum creatinine 05/12/2020   Non compliance with medical treatment 12/17/2017   Hyperparathyroidism (HCC) 10/19/2016   Vitamin D  deficiency 01/12/2016   Altered mental status 09/22/2015   Left hemiparesis (HCC) 09/22/2015   Acute encephalopathy 09/22/2015   Palliative care encounter    UTI (urinary tract infection) 09/14/2015   Dementia (HCC) 09/14/2015   General weakness 09/14/2015   Subacute confusional state 09/14/2015   Type  2 diabetes mellitus without complication, with long-term current use of insulin  (HCC) 09/14/2015   Confusion 09/14/2015   Fall at home    Elevated parathyroid  hormone 09/09/2015   Mucosal abnormality of stomach    Diverticulosis of colon without hemorrhage     Hypercalcemia 08/10/2015   Goiter 08/10/2015   Abnormal weight loss 07/28/2015   Uncontrolled type 2 diabetes mellitus 03/26/2015   Neuropathy 03/26/2015   Hypokalemia 03/26/2015   Essential hypertension, benign 03/26/2015   Mixed hyperlipidemia 03/26/2015   Malnutrition of moderate degree (HCC) 03/26/2015   PCP:  Ayesha Lente, FNP Pharmacy:   [facility] [address], [place] - [address] [place] [address] Phone: [phone] Fax: [phone]     Social Drivers of Health (SDOH) Social History: SDOH Screenings   Food Insecurity: No Food Insecurity (11/18/2023)  Housing: Low Risk  (11/18/2023)  Transportation Needs: No Transportation Needs (11/18/2023)  Utilities: Not At Risk (11/18/2023)  Depression (PHQ2-9): Low Risk  (09/14/2023)  Physical Activity: Inactive (08/20/2020)  Social Connections: Moderately Integrated (11/18/2023)  Tobacco Use: Low Risk  (11/19/2023)   SDOH Interventions:     Readmission Risk Interventions    11/19/2023    3:13 PM 09/29/2023    4:26 PM  Readmission Risk Prevention Plan  Transportation Screening Complete Complete  PCP or Specialist Appt within 3-5 Days Not Complete Complete  HRI or Home Care Consult Complete Complete  Social Work Consult for Recovery Care Planning/Counseling Not Complete Complete  Palliative Care Screening Not Complete Complete  Medication Review Oceanographer) Complete Complete

## 2023-11-20 DIAGNOSIS — Z515 Encounter for palliative care: Secondary | ICD-10-CM | POA: Diagnosis not present

## 2023-11-20 DIAGNOSIS — J9601 Acute respiratory failure with hypoxia: Secondary | ICD-10-CM | POA: Diagnosis not present

## 2023-11-20 DIAGNOSIS — Z7189 Other specified counseling: Secondary | ICD-10-CM | POA: Diagnosis not present

## 2023-11-20 LAB — CBC
HCT: 26.3 % — ABNORMAL LOW (ref 36.0–46.0)
Hemoglobin: 8.4 g/dL — ABNORMAL LOW (ref 12.0–15.0)
MCH: 29.1 pg (ref 26.0–34.0)
MCHC: 31.9 g/dL (ref 30.0–36.0)
MCV: 91 fL (ref 80.0–100.0)
Platelets: 278 10*3/uL (ref 150–400)
RBC: 2.89 MIL/uL — ABNORMAL LOW (ref 3.87–5.11)
RDW: 15.7 % — ABNORMAL HIGH (ref 11.5–15.5)
WBC: 7.9 10*3/uL (ref 4.0–10.5)
nRBC: 0 % (ref 0.0–0.2)

## 2023-11-20 LAB — GLUCOSE, CAPILLARY
Glucose-Capillary: 131 mg/dL — ABNORMAL HIGH (ref 70–99)
Glucose-Capillary: 189 mg/dL — ABNORMAL HIGH (ref 70–99)
Glucose-Capillary: 148 mg/dL — ABNORMAL HIGH (ref 70–99)
Glucose-Capillary: 214 mg/dL — ABNORMAL HIGH (ref 70–99)

## 2023-11-20 LAB — TYPE AND SCREEN
ABO/RH(D): O POS
Antibody Screen: NEGATIVE
Unit division: 0

## 2023-11-20 LAB — BPAM RBC
Blood Product Expiration Date: 202505252359
ISSUE DATE / TIME: 202505051639
Unit Type and Rh: 5100

## 2023-11-20 LAB — BASIC METABOLIC PANEL WITH GFR
Anion gap: 7 (ref 5–15)
BUN: 44 mg/dL — ABNORMAL HIGH (ref 8–23)
CO2: 22 mmol/L (ref 22–32)
Calcium: 8.9 mg/dL (ref 8.9–10.3)
Chloride: 110 mmol/L (ref 98–111)
Creatinine, Ser: 4.17 mg/dL — ABNORMAL HIGH (ref 0.44–1.00)
GFR, Estimated: 10 mL/min — ABNORMAL LOW (ref >60)
Glucose, Bld: 127 mg/dL — ABNORMAL HIGH (ref 70–99)
Potassium: 4.1 mmol/L (ref 3.5–5.1)
Sodium: 139 mmol/L (ref 135–145)

## 2023-11-20 LAB — BRAIN NATRIURETIC PEPTIDE: B Natriuretic Peptide: 159 pg/mL — ABNORMAL HIGH (ref 0.0–100.0)

## 2023-11-20 LAB — MAGNESIUM: Magnesium: 1.8 mg/dL (ref 1.7–2.4)

## 2023-11-20 MED ORDER — FUROSEMIDE 10 MG/ML IJ SOLN
80.0000 mg | Freq: Three times a day (TID) | INTRAMUSCULAR | Status: DC
  Administered 2023-11-20 – 2023-11-21 (×3): 80 mg via INTRAVENOUS
  Filled 2023-11-20 (×3): qty 8

## 2023-11-20 NOTE — Progress Notes (Signed)
 Palliative: Maria Parrish is lying quietly in bed.  She appears chronically ill but improved from yesterday.  She greets me, making and mostly keeping eye contact.  She is alert and oriented, able to make her needs known.  Her church pastor is present at bedside.  We talked about her acute health concerns and the treatment plan.  She smiles and tells me that she feels much better.  We talked about her low blood which has improved.  We talked about time for outcomes.  We talked about possible short-term rehab if needed/qualified and she so desires.  I shared that we will follow-up tomorrow.  Plan: Continue to treat the treatable but no CPR or intubation.  Time for outcomes.  Anticipate home with outpatient palliative services.                  25 minutes  Trystyn Dolley, NP Palliative medicine team Team phone [phone]

## 2023-11-20 NOTE — Progress Notes (Signed)
 Mobility Specialist Progress Note:    11/20/23 1500  Mobility  Activity Ambulated with assistance in room;Stood at bedside  Level of Assistance Minimal assist, patient does 75% or more  Assistive Device Front wheel walker  Distance Ambulated (ft) 35 ft  Range of Motion/Exercises Active;All extremities  Activity Response Tolerated well  Mobility Referral Yes  Mobility visit 1 Mobility  Mobility Specialist Start Time (ACUTE ONLY) 1440  Mobility Specialist Stop Time (ACUTE ONLY) 1500  Mobility Specialist Time Calculation (min) (ACUTE ONLY) 20 min   Pt received in bed, agreeable to mobility. Required MinA to stand and ambulate with RW.Tolerated well, see O2 sats below. Left pt supine on 1L via [place] per nursing staff. Alarm on, all needs met.   SpO2 95% on RA at rest SpO2 91% on RA during ambulation SpO2 90% on RA after ambulation, audile SOB SpO2 96% on 1L after session  Glinda Lapping Mobility Specialist Please contact via SecureChat or  Rehab office at [phone]

## 2023-11-20 NOTE — Plan of Care (Signed)

## 2023-11-20 NOTE — Progress Notes (Signed)
 PROGRESS NOTE    Maria Parrish  WGN:[id number] DOB: [birth year] DOA: 11/18/2023 PCP: Ayesha Lente, FNP   Brief Narrative:    Maria Parrish is a 78 y.o. female with past medical history significant for chronic diastolic heart failure, hyperlipidemia, history of COPD, hypertension, type 2 diabetes mellitus with nephropathy and chronic kidney disease stage V; who presented to the hospital secondary to increased shortness of breath and orthopnea.  Patient was admitted with acute hypoxemic respiratory failure secondary to diastolic CHF exacerbation in the setting of worsening CKD stage V.  She has been started on diuresis and does not desire hemodialysis.  She remains on IV Lasix  for diuresis and required 1 unit PRBC transfusion on 5/5.  No overt bleeding noted.  Palliative care consulted for further discussion regarding poor long-term prognosis since she does not want hemodialysis.  Assessment & Plan:   Principal Problem:   Acute hypoxemic respiratory failure (HCC)  Assessment and Plan:    1-acute hypoxemic respiratory failure secondary to diastolic heart failure - Acute on chronic diastolic CHF - Patient's condition and treatment limited by his stage V chronic kidney disease. - Will admit to telemetry, follow daily weights, strict I's and O's and provide IV diuresis increased to 80 mg IV Lasix  3 times daily - Continue to wean oxygen  supplementation as tolerated - Follow clinical response and monitor -Reds clip 41% and BNP decreasing which is reassuring - Respiratory panel: Negative for COVID, influenza and RSV.   2-chronic kidney disease stage V/metabolic acidosis - Continue to follow renal function and continue the use of sodium bicarbonate  - Patient has expressed not wanting hemodialysis and understand terminal renal condition. - Palliative care has been consulted-further discussions with family pending -Does not appear to have immediate poor prognosis   3-COPD - No acute  bronchospasms noted -Wean oxygen  to baseline room air   4-type 2 diabetes with nephropathy - Continue sliding scale insulin  - Follow CBG fluctuation.   5-hypertension - With mild hypertensive crisis at time of admission - Continue Imdur , hydralazine , amlodipine , carvedilol  and Lasix  - Follow vital signs - Medication compliance discussed with patient.   6-GERD - Continue PPI.  7-worsening anemia - Hold heparin  agents - Continue to monitor with repeat hemoglobin levels - Status post 1 unit PRBC transfusion 5/5, consider further transfusion for hemoglobin less than 7 - Monitor CBC   DVT prophylaxis:SCDs Code Status: DNR Family Communication: Son on phone 5/5 Disposition Plan:  Status is: Inpatient Remains inpatient appropriate because: Needs IV medications.    Consultants:  Palliative care  Procedures:  None  Antimicrobials:  None   Subjective: Patient seen and evaluated today and is much more awake and alert this morning.  She denies any symptomatic complaints or concerns and remains on 2 L nasal cannula.  Objective: Vitals:   11/19/23 2020 11/19/23 2251 11/20/23 0455 11/20/23 0524  BP: (!) 148/63  (!) 143/61   Pulse: 69 64 62   Resp: (!) 24  20   Temp: 98.4 F (36.9 C)  98.2 F (36.8 C)   TempSrc: Oral  Oral   SpO2: 100%  97%   Weight:    68.6 kg  Height:        Intake/Output Summary (Last 24 hours) at 11/20/2023 1131 Last data filed at 11/20/2023 0500 Gross per 24 hour  Intake 965 ml  Output 850 ml  Net 115 ml   Filed Weights   11/18/23 0826 11/19/23 0529 11/20/23 0524  Weight: 64.7 kg 66.9 kg  68.6 kg    Examination:  General exam: Appears calm and comfortable  Respiratory system: Clear to auscultation. Respiratory effort normal.  2 L nasal cannula oxygen . Cardiovascular system: S1 & S2 heard, RRR.  Gastrointestinal system: Abdomen is soft Central nervous system: Alert and awake Extremities: No edema Skin: No significant lesions  noted Psychiatry: Flat affect.    Data Reviewed: I have personally reviewed following labs and imaging studies  CBC: Recent Labs  Lab 11/18/23 0904 11/19/23 0454 11/19/23 0823 11/20/23 0539  WBC 10.8* 7.6 7.2 7.9  NEUTROABS 9.6*  --   --   --   HGB 8.7* 6.7* 6.8* 8.4*  HCT 26.5* 20.4* 21.2* 26.3*  MCV 88.9 88.7 89.8 91.0  PLT 368 314 288 278   Basic Metabolic Panel: Recent Labs  Lab 11/18/23 0904 11/19/23 0454 11/20/23 0539  NA 138 139 139  K 3.0* 3.3* 4.1  CL 107 110 110  CO2 17* 20* 22  GLUCOSE 199* 132* 127*  BUN 39* 39* 44*  CREATININE 4.02* 4.04* 4.17*  CALCIUM  9.5 8.7* 8.9  MG 2.0 1.8 1.8  PHOS 3.8  --   --    GFR: Estimated Creatinine Clearance: 10.8 mL/min (A) (by C-G formula based on SCr of 4.17 mg/dL (H)). Liver Function Tests: No results for input(s): "AST", "ALT", "ALKPHOS", "BILITOT", "PROT", "ALBUMIN" in the last 168 hours. No results for input(s): "LIPASE", "AMYLASE" in the last 168 hours. No results for input(s): "AMMONIA" in the last 168 hours. Coagulation Profile: No results for input(s): "INR", "PROTIME" in the last 168 hours. Cardiac Enzymes: No results for input(s): "CKTOTAL", "CKMB", "CKMBINDEX", "TROPONINI" in the last 168 hours. BNP (last 3 results) No results for input(s): "PROBNP" in the last 8760 hours. HbA1C: No results for input(s): "HGBA1C" in the last 72 hours. CBG: Recent Labs  Lab 11/19/23 0745 11/19/23 1106 11/19/23 1623 11/19/23 2026 11/20/23 0813  GLUCAP 140* 178* 127* 230* 131*   Lipid Profile: No results for input(s): "CHOL", "HDL", "LDLCALC", "TRIG", "CHOLHDL", "LDLDIRECT" in the last 72 hours. Thyroid  Function Tests: No results for input(s): "TSH", "T4TOTAL", "FREET4", "T3FREE", "THYROIDAB" in the last 72 hours. Anemia Panel: Recent Labs    11/19/23 1343  VITAMINB12 1,950*  FOLATE 14.1  FERRITIN 397*  TIBC 156*  IRON 36  RETICCTPCT 1.5   Sepsis Labs: No results for input(s): "PROCALCITON",  "LATICACIDVEN" in the last 168 hours.  Recent Results (from the past 240 hours)  Resp panel by RT-PCR (RSV, Flu A&B, Covid) Anterior Nasal Swab     Status: None   Collection Time: 11/18/23  2:12 PM   Specimen: Anterior Nasal Swab  Result Value Ref Range Status   SARS Coronavirus 2 by RT PCR NEGATIVE NEGATIVE Final    Comment: (NOTE) SARS-CoV-2 target nucleic acids are NOT DETECTED.  The SARS-CoV-2 RNA is generally detectable in upper respiratory specimens during the acute phase of infection. The lowest concentration of SARS-CoV-2 viral copies this assay can detect is 138 copies/mL. A negative result does not preclude SARS-Cov-2 infection and should not be used as the sole basis for treatment or other patient management decisions. A negative result may occur with  improper specimen collection/handling, submission of specimen other than nasopharyngeal swab, presence of viral mutation(s) within the areas targeted by this assay, and inadequate number of viral copies(<138 copies/mL). A negative result must be combined with clinical observations, patient history, and epidemiological information. The expected result is Negative.  Fact Sheet for Patients:  BloggerCourse.com  Fact Sheet for Healthcare  Providers:  SeriousBroker.it  This test is no t yet approved or cleared by the [place]  FDA and  has been authorized for detection and/or diagnosis of SARS-CoV-2 by FDA under an Emergency Use Authorization (EUA). This EUA will remain  in effect (meaning this test can be used) for the duration of the COVID-19 declaration under Section 564(b)(1) of the Act, 21 U.S.C.section 360bbb-3(b)(1), unless the authorization is terminated  or revoked sooner.       Influenza A by PCR NEGATIVE NEGATIVE Final   Influenza B by PCR NEGATIVE NEGATIVE Final    Comment: (NOTE) The Xpert Xpress SARS-CoV-2/FLU/RSV plus assay is intended as an aid in the  diagnosis of influenza from Nasopharyngeal swab specimens and should not be used as a sole basis for treatment. Nasal washings and aspirates are unacceptable for Xpert Xpress SARS-CoV-2/FLU/RSV testing.  Fact Sheet for Patients: BloggerCourse.com  Fact Sheet for Healthcare Providers: SeriousBroker.it  This test is not yet approved or cleared by the [place]  FDA and has been authorized for detection and/or diagnosis of SARS-CoV-2 by FDA under an Emergency Use Authorization (EUA). This EUA will remain in effect (meaning this test can be used) for the duration of the COVID-19 declaration under Section 564(b)(1) of the Act, 21 U.S.C. section 360bbb-3(b)(1), unless the authorization is terminated or revoked.     Resp Syncytial Virus by PCR NEGATIVE NEGATIVE Final    Comment: (NOTE) Fact Sheet for Patients: BloggerCourse.com  Fact Sheet for Healthcare Providers: SeriousBroker.it  This test is not yet approved or cleared by the [place]  FDA and has been authorized for detection and/or diagnosis of SARS-CoV-2 by FDA under an Emergency Use Authorization (EUA). This EUA will remain in effect (meaning this test can be used) for the duration of the COVID-19 declaration under Section 564(b)(1) of the Act, 21 U.S.C. section 360bbb-3(b)(1), unless the authorization is terminated or revoked.  Performed at [facility], [address], [place], [place] [address]          Radiology Studies: No results found.       Scheduled Meds:  sodium chloride    Intravenous Once   amLODipine   10 mg Oral Daily   carvedilol   12.5 mg Oral BID   ezetimibe   10 mg Oral Daily   furosemide   80 mg Intravenous TID   hydrALAZINE   50 mg Oral Q8H   insulin  aspart  0-5 Units Subcutaneous QHS   insulin  aspart  0-9 Units Subcutaneous TID WC   isosorbide  mononitrate  30 mg Oral Daily   pantoprazole    40 mg Oral Daily   sodium bicarbonate   650 mg Oral BID   sodium chloride  flush  3 mL Intravenous Q12H      LOS: 1 day    Time spent: 55 minutes    Compton Brigance Loran Rock, DO [facility]  If 7PM-7AM, please contact night-coverage [email or web address] 11/20/2023, 11:31 AM

## 2023-11-21 ENCOUNTER — Telehealth: Payer: Self-pay | Admitting: "Endocrinology

## 2023-11-21 DIAGNOSIS — Z515 Encounter for palliative care: Secondary | ICD-10-CM

## 2023-11-21 DIAGNOSIS — Z7189 Other specified counseling: Secondary | ICD-10-CM

## 2023-11-21 LAB — BASIC METABOLIC PANEL WITH GFR
Anion gap: 9 (ref 5–15)
BUN: 49 mg/dL — ABNORMAL HIGH (ref 8–23)
CO2: 22 mmol/L (ref 22–32)
Calcium: 9.1 mg/dL (ref 8.9–10.3)
Chloride: 107 mmol/L (ref 98–111)
Creatinine, Ser: 4.44 mg/dL — ABNORMAL HIGH (ref 0.44–1.00)
GFR, Estimated: 10 mL/min — ABNORMAL LOW (ref >60)
Glucose, Bld: 134 mg/dL — ABNORMAL HIGH (ref 70–99)
Potassium: 3.9 mmol/L (ref 3.5–5.1)
Sodium: 138 mmol/L (ref 135–145)

## 2023-11-21 LAB — CBC
HCT: 25.4 % — ABNORMAL LOW (ref 36.0–46.0)
Hemoglobin: 8.3 g/dL — ABNORMAL LOW (ref 12.0–15.0)
MCH: 29.4 pg (ref 26.0–34.0)
MCHC: 32.7 g/dL (ref 30.0–36.0)
MCV: 90.1 fL (ref 80.0–100.0)
Platelets: 274 10*3/uL (ref 150–400)
RBC: 2.82 MIL/uL — ABNORMAL LOW (ref 3.87–5.11)
RDW: 15.7 % — ABNORMAL HIGH (ref 11.5–15.5)
WBC: 8.3 10*3/uL (ref 4.0–10.5)
nRBC: 0 % (ref 0.0–0.2)

## 2023-11-21 LAB — GLUCOSE, CAPILLARY
Glucose-Capillary: 144 mg/dL — ABNORMAL HIGH (ref 70–99)
Glucose-Capillary: 216 mg/dL — ABNORMAL HIGH (ref 70–99)

## 2023-11-21 LAB — MAGNESIUM: Magnesium: 1.8 mg/dL (ref 1.7–2.4)

## 2023-11-21 MED ORDER — HYDRALAZINE HCL 100 MG PO TABS: 50.0000 mg | ORAL_TABLET | Freq: Three times a day (TID) | ORAL | Status: DC

## 2023-11-21 MED ORDER — TORSEMIDE 20 MG PO TABS
20.0000 mg | ORAL_TABLET | Freq: Two times a day (BID) | ORAL | Status: DC
Start: 2023-11-21 — End: 2024-02-02

## 2023-11-21 NOTE — Evaluation (Addendum)
 Physical Therapy Evaluation Patient Details Name: Maria Parrish MRN: [id number] DOB: May 12, 1946 Today's Date: 11/21/2023  History of Present Illness  a 78 y.o. female with past medical history significant for chronic diastolic heart failure, hyperlipidemia, history of COPD, hypertension, type 2 diabetes mellitus with nephropathy and chronic kidney disease stage V; who presented to the hospital secondary to increased shortness of breath and orthopnea.  Patient was admitted with acute hypoxemic respiratory failure secondary to diastolic CHF exacerbation in the setting of worsening CKD stage V.  She has been started on diuresis and does not desire hemodialysis.  She remains on IV Lasix  for diuresis and required 1 unit PRBC transfusion on 5/5.  No overt bleeding noted.  Palliative care consulted for further discussion regarding poor long-term prognosis since she does not want hemodialysis  Clinical Impression   Pt tolerated today's Physical Therapy Evaluation, well with good carryover for OOB mobility. Pt's baseline is modified independent with rollator for [facility] ambulation and assisted with ADLs. Currently, close to baseline functional mobility, mild deficits noted with ambulation due to excessive wide turns and increased lateral veering. Pt would benefit from home health PT services as pt cannot get to OP PT services.  Based upon these deficits/impairments, patient will benefit from continued skilled physical therapy services during remainder of hospital stay and at the next recommended venue of care to address deficits and promote return to optimal function.                If plan is discharge home, recommend the following: A little help with walking and/or transfers;A little help with bathing/dressing/bathroom   Can travel by private vehicle        Equipment Recommendations None recommended by PT  Recommendations for Other Services       Functional Status Assessment Patient has had a recent  decline in their functional status and demonstrates the ability to make significant improvements in function in a reasonable and predictable amount of time.     Precautions / Restrictions Precautions Precautions: None Restrictions Weight Bearing Restrictions Per Provider Order: No      Mobility  Bed Mobility Overal bed mobility: Modified Independent             General bed mobility comments: slow, labored, use of bedrail for elevating trunk Patient Response: Cooperative  Transfers Overall transfer level: Needs assistance Equipment used: Rolling walker (2 wheels) Transfers: Sit to/from Stand Sit to Stand: Supervision           General transfer comment: slow, labored, unsteady with pulling RW    Ambulation/Gait Ambulation/Gait assistance: Modified independent (Device/Increase time) Gait Distance (Feet): 45 Feet Assistive device: Rolling walker (2 wheels) Gait Pattern/deviations: WFL(Within Functional Limits), Step-through pattern       General Gait Details: pt with excessive wide turns during ambulation out to hall with constant veering left/right with RW. No LOB.  Stairs            Wheelchair Mobility     Tilt Bed Tilt Bed Patient Response: Cooperative  Modified Rankin (Stroke Patients Only)       Balance Overall balance assessment: Needs assistance Sitting-balance support: Feet supported Sitting balance-Leahy Scale: Good Sitting balance - Comments: seated EOB   Standing balance support: Bilateral upper extremity supported, During functional activity, Reliant on assistive device for balance Standing balance-Leahy Scale: Fair Standing balance comment: fair<> good in standing with RW statically  Pertinent Vitals/Pain Pain Assessment Pain Assessment: Faces Faces Pain Scale: Hurts a little bit Pain Location: knees/ankles- dependent upon the weather    Home Living Family/patient expects to be discharged  to:: Private residence Living Arrangements: Children Available Help at Discharge: Family;Available PRN/intermittently;Available 24 hours/day Type of Home: House Home Access: Ramped entrance       Home Layout: One level Home Equipment: Agricultural consultant (2 wheels);Cane - single point;Wheelchair Financial trader (4 wheels);Hospital bed      Prior Function Prior Level of Function : Independent/Modified Independent             Mobility Comments: independent with RW. 2 falls in the last 6 months ADLs Comments: independent with ADLs.     Extremity/Trunk Assessment   Upper Extremity Assessment Upper Extremity Assessment: Overall WFL for tasks assessed    Lower Extremity Assessment Lower Extremity Assessment: Overall WFL for tasks assessed;Generalized weakness;RLE deficits/detail;LLE deficits/detail RLE Deficits / Details: 4-/5 MMT grossly RLE Sensation: WNL LLE Deficits / Details: 4-/5 MMT grossly LLE Sensation: WNL       Communication   Communication Communication: No apparent difficulties    Cognition Arousal: Alert Behavior During Therapy: WFL for tasks assessed/performed   PT - Cognitive impairments: No apparent impairments                         Following commands: Intact       Cueing Cueing Techniques: Verbal cues, Tactile cues     General Comments      Exercises     Assessment/Plan    PT Assessment All further PT needs can be met in the next venue of care;Patient needs continued PT services  PT Problem List Decreased strength;Decreased activity tolerance;Decreased balance;Decreased mobility       PT Treatment Interventions      PT Goals (Current goals can be found in the Care Plan section)  Acute Rehab PT Goals Patient Stated Goal: return home PT Goal Formulation: With patient Time For Goal Achievement: 11/28/23 Potential to Achieve Goals: Good    Frequency Min 2X/week     Co-evaluation               AM-PAC PT "6  Clicks" Mobility  Outcome Measure Help needed turning from your back to your side while in a flat bed without using bedrails?: None Help needed moving from lying on your back to sitting on the side of a flat bed without using bedrails?: None Help needed moving to and from a bed to a chair (including a wheelchair)?: None Help needed standing up from a chair using your arms (e.g., wheelchair or bedside chair)?: None Help needed to walk in hospital room?: A Little Help needed climbing 3-5 steps with a railing? : A Lot 6 Click Score: 21    End of Session Equipment Utilized During Treatment: Gait belt Activity Tolerance: Patient tolerated treatment well Patient left: in bed;with call bell/phone within reach;with family/visitor present;with bed alarm set Nurse Communication: Mobility status PT Visit Diagnosis: Unsteadiness on feet (R26.81);Muscle weakness (generalized) (M62.81)    Time: 7846-9629 PT Time Calculation (min) (ACUTE ONLY): 18 min   Charges:   PT Evaluation $PT Eval Low Complexity: 1 Low   PT General Charges $$ ACUTE PT VISIT: 1 Visit         Astrid Lay, DPT [facility] [place] 336 [phone] office  Gatha Kaska 11/21/2023, 11:59 AM

## 2023-11-21 NOTE — Progress Notes (Signed)
   11/21/23 1100  ReDS Vest / Clip  Station Marker A  Ruler Value 28  ReDS Value Range 36 - 40  ReDS Actual Value 37

## 2023-11-21 NOTE — Progress Notes (Signed)
 Palliative: Mrs. Maria Parrish is lying quietly in bed.  She appears chronically ill and somewhat frail.  She greets me, making and mostly keeping eye contact.  As per her norm, she is alert and oriented x 3, able to make her needs known.  There is no family at bedside at this time.  We talked about her acute health concerns and the treatment plan.  I reassured her that her hemoglobin has remained stable.  We talked about an increase in creatinine, but not outside of her highs.  She tells me that she feels much improved, and feels that she is ready to return home with her son [name].  We talked about the benefits of outpatient palliative services for further support.  She is agreeable.  We talked about provider choice, and she chooses [facility]. Conference with attending, bedside nursing staff, transition of care team related to patient condition, needs, goals of care, disposition.  Plan: Continue to treat the treatable but no CPR or intubation.  Would not accept dialysis.  Home with the benefits of outpatient palliative services with [facility]. DNR/goldenrod form completed and placed on chart.  35 minutes Juliette Standre, NP Palliative medicine team Team phone [phone]

## 2023-11-21 NOTE — Telephone Encounter (Signed)
 Pts son states that she has been discharged from the hospital, and the hospital didn't carry tresiba  so they have been giving her another kind of insulin .  Son needs to know instructions on what insulin  she is needing and how much.  [phone] Will be home after 3:30.

## 2023-11-21 NOTE — Plan of Care (Signed)
  Problem: Acute Rehab PT Goals(only PT should resolve) Goal: Patient Will Perform Sitting Balance Flowsheets (Taken 11/21/2023 1201) Patient will perform sitting balance: Independently Goal: Patient Will Transfer Sit To/From Stand Flowsheets (Taken 11/21/2023 1201) Patient will transfer sit to/from stand: Independently Goal: Pt Will Perform Standing Balance Or Pre-Gait Flowsheets (Taken 11/21/2023 1201) Pt will perform standing balance or pre-gait: Independently Goal: Pt Will Ambulate Flowsheets (Taken 11/21/2023 1201) Pt will Ambulate:  50 feet  with modified independence  with least restrictive assistive device  Gatha Kaska PT, DPT [facility] [place] [phone] office

## 2023-11-21 NOTE — Discharge Summary (Signed)
 Physician Discharge Summary  Maria Parrish ZOX:[id number] DOB: 18-Sep-1945 DOA: 11/18/2023  PCP: Ayesha Lente, FNP  Admit date: 11/18/2023 Discharge date: 11/21/2023  Admitted From:  Home  Disposition:  Home with home  health   Recommendations for Outpatient Follow-up:  Follow up with PCP in 1 weeks Please obtain BMP/CBC in 1 week  Home Health:  PT   Discharge Condition: STABLE   CODE STATUS: DNR DIET: soft foods, heart healthy, carb modified    Brief Hospitalization Summary: Please see all hospital notes, images, labs for full details of the hospitalization. Admission provider HPI:  78 y.o. female with past medical history significant for chronic diastolic heart failure, hyperlipidemia, history of COPD, hypertension, type 2 diabetes mellitus with nephropathy and chronic kidney disease stage V; who presented to the hospital secondary to increased shortness of breath and orthopnea.  Patient was admitted with acute hypoxemic respiratory failure secondary to diastolic CHF exacerbation in the setting of worsening CKD stage V.  She has been started on diuresis and does not desire hemodialysis.  She remains on IV Lasix  for diuresis and required 1 unit PRBC transfusion on 5/5.  No overt bleeding noted.  Palliative care consulted for further discussion regarding poor long-term prognosis since she does not want hemodialysis.   Hospital Course by listed problems addressed   acute hypoxemic respiratory failure secondary to diastolic heart failure - Acute on chronic diastolic CHF- treated with IV furosemide  with good results - Patient's condition and treatment limited by his stage V chronic kidney disease. - Will admit to telemetry, follow daily weights, strict I's and O's and provide IV diuresis increased to 80 mg IV Lasix  3 times daily - Continue to wean oxygen  supplementation as tolerated - Follow clinical response and monitor -Reds clip 37% and BNP decreasing which is reassuring, clinically patient  is much improved and feels back to her baseline - Respiratory panel: Negative for COVID, influenza and RSV.   chronic kidney disease stage V/metabolic acidosis - Continue to follow renal function and continue the use of sodium bicarbonate  - Patient has expressed not wanting hemodialysis and understand terminal renal condition. - Palliative care has been consulted-further discussions with family ongoing   COPD - No acute bronchospasms noted - Wean oxygen  to baseline room air   Type 2 diabetes with nephropathy - treated with sliding scale insulin  - Follow CBG fluctuation.  CBG (last 3)  Recent Labs    11/20/23 1611 11/20/23 2127 11/21/23 0810  GLUCAP 148* 214* 144*   hypertension - With mild hypertensive crisis at time of admission - Continue Imdur , hydralazine , amlodipine , carvedilol  and Lasix  - Follow vital signs - Medication compliance discussed with patient.   GERD - Continue PPI.   Normocytic anemia - Hold heparin  agents - Continue to monitor with repeat hemoglobin levels on outpatient basis - Status post 1 unit PRBC transfusion 5/5, hg stable at 8.3.  - continue daily iron supplement.  Follow up with PCP for repeat labs in 1 week  Discharge Diagnoses:  Principal Problem:   Acute hypoxemic respiratory failure [facility])  Discharge Instructions:  Allergies as of 11/21/2023       Reactions   Statins Other (See Comments)   Makes wrists and fingers ache Muscle Pain and Joint Pain   Penicillins Other (See Comments)   Makes patient sore.        Medication List     STOP taking these medications    aspirin  EC 81 MG tablet   Tresiba  FlexTouch 100 UNIT/ML  FlexTouch Pen Generic drug: insulin  degludec       TAKE these medications    Accu-Chek Guide Me w/Device Kit Use to check blood glucose twice daily as directed.   Accu-Chek Guide Test test strip Generic drug: glucose blood Use to check blood glucose twice daily as directed.   Accu-Chek Softclix Lancets  lancets Use to check blood glucose twice daily as instructed   albuterol  108 (90 Base) MCG/ACT inhaler Commonly known as: VENTOLIN  HFA Inhale 2 puffs into the lungs every 6 (six) hours as needed for wheezing or shortness of breath.   amLODipine  10 MG tablet Commonly known as: NORVASC  Take 1 tablet by mouth once daily   B-D SINGLE USE SWABS REGULAR Pads Use to check glucose twice daily as instructed   B-D ULTRAFINE III SHORT PEN 31G X 8 MM Misc Generic drug: Insulin  Pen Needle ISE 1 PEN NEEDLE ONCE DAILY AS DIRECTED   carvedilol  12.5 MG tablet Commonly known as: COREG  Take 1 tablet by mouth twice daily What changed: when to take this   epoetin  alfa-epbx 4000 UNIT/ML injection Commonly known as: RETACRIT  Inject 4,000 Units into the skin 2 (two) times a week. Friday   ezetimibe  10 MG tablet Commonly known as: ZETIA  Take 1 tablet (10 mg total) by mouth daily.   ferrous sulfate  325 (65 FE) MG tablet Take 325 mg by mouth daily with breakfast.   hydrALAZINE  100 MG tablet Commonly known as: APRESOLINE  Take 0.5 tablets (50 mg total) by mouth 3 (three) times daily. What changed:  how much to take when to take this   isosorbide  mononitrate 30 MG 24 hr tablet Commonly known as: IMDUR  Take 1 tablet (30 mg total) by mouth daily.   pantoprazole  40 MG tablet Commonly known as: Protonix  Take 1 tablet (40 mg total) by mouth daily.   sodium bicarbonate  650 MG tablet Take 1 tablet (650 mg total) by mouth 2 (two) times daily.   torsemide  20 MG tablet Commonly known as: DEMADEX  Take 1 tablet (20 mg total) by mouth 2 (two) times daily.   VITAMIN B-12 PO Take 1 tablet by mouth daily.        Follow-up Information     Ayesha Lente, FNP. Schedule an appointment as soon as possible for a visit in 1 week(s).   Specialty: Nurse Practitioner Why: Hospital Follow Up Contact information: [address] [place] [address] [phone]                Allergies   Allergen Reactions   Statins Other (See Comments)    Makes wrists and fingers ache Muscle Pain and Joint Pain   Penicillins Other (See Comments)    Makes patient sore.   Allergies as of 11/21/2023       Reactions   Statins Other (See Comments)   Makes wrists and fingers ache Muscle Pain and Joint Pain   Penicillins Other (See Comments)   Makes patient sore.        Medication List     STOP taking these medications    aspirin  EC 81 MG tablet   Tresiba  FlexTouch 100 UNIT/ML FlexTouch Pen Generic drug: insulin  degludec       TAKE these medications    Accu-Chek Guide Me w/Device Kit Use to check blood glucose twice daily as directed.   Accu-Chek Guide Test test strip Generic drug: glucose blood Use to check blood glucose twice daily as directed.   Accu-Chek Softclix Lancets lancets Use to check  blood glucose twice daily as instructed   albuterol  108 (90 Base) MCG/ACT inhaler Commonly known as: VENTOLIN  HFA Inhale 2 puffs into the lungs every 6 (six) hours as needed for wheezing or shortness of breath.   amLODipine  10 MG tablet Commonly known as: NORVASC  Take 1 tablet by mouth once daily   B-D SINGLE USE SWABS REGULAR Pads Use to check glucose twice daily as instructed   B-D ULTRAFINE III SHORT PEN 31G X 8 MM Misc Generic drug: Insulin  Pen Needle ISE 1 PEN NEEDLE ONCE DAILY AS DIRECTED   carvedilol  12.5 MG tablet Commonly known as: COREG  Take 1 tablet by mouth twice daily What changed: when to take this   epoetin  alfa-epbx 4000 UNIT/ML injection Commonly known as: RETACRIT  Inject 4,000 Units into the skin 2 (two) times a week. Friday   ezetimibe  10 MG tablet Commonly known as: ZETIA  Take 1 tablet (10 mg total) by mouth daily.   ferrous sulfate  325 (65 FE) MG tablet Take 325 mg by mouth daily with breakfast.   hydrALAZINE  100 MG tablet Commonly known as: APRESOLINE  Take 0.5 tablets (50 mg total) by mouth 3 (three) times daily. What changed:  how  much to take when to take this   isosorbide  mononitrate 30 MG 24 hr tablet Commonly known as: IMDUR  Take 1 tablet (30 mg total) by mouth daily.   pantoprazole  40 MG tablet Commonly known as: Protonix  Take 1 tablet (40 mg total) by mouth daily.   sodium bicarbonate  650 MG tablet Take 1 tablet (650 mg total) by mouth 2 (two) times daily.   torsemide  20 MG tablet Commonly known as: DEMADEX  Take 1 tablet (20 mg total) by mouth 2 (two) times daily.   VITAMIN B-12 PO Take 1 tablet by mouth daily.        Procedures/Studies: DG Chest Portable 1 View Result Date: 11/18/2023 CLINICAL DATA:  SOB EXAM: PORTABLE CHEST - 1 VIEW COMPARISON:  09/28/2023. FINDINGS: Cardiac silhouette is prominent. There is pulmonary interstitial prominence with vascular congestion. No focal consolidation. No pneumothorax. Small left-sided pleural effusion. Calcified aorta. IMPRESSION: Findings suggest CHF. Electronically Signed   By: Sydell Eva M.D.   On: 11/18/2023 09:28     Subjective: Pt says she is feeling well, she is not having any more SOB symptoms and she is eager to discharge home today.  No specific complaints.   Discharge Exam: Vitals:   11/20/23 2124 11/21/23 0452  BP: (!) 137/59 (!) 153/64  Pulse: 66 66  Resp: 20 20  Temp: 98.1 F (36.7 C) 98.3 F (36.8 C)  SpO2: 99% 98%   Vitals:   11/20/23 1312 11/20/23 2124 11/21/23 0452 11/21/23 0500  BP: (!) 142/64 (!) 137/59 (!) 153/64   Pulse: 64 66 66   Resp: 18 20 20    Temp: 98 F (36.7 C) 98.1 F (36.7 C) 98.3 F (36.8 C)   TempSrc: Oral Oral Oral   SpO2: 97% 99% 98%   Weight:    68.2 kg  Height:        General: Pt is alert, awake, not in acute distress Cardiovascular: normal S1/S2 +, no rubs, no gallops Respiratory: CTA bilaterally, no wheezing, no rhonchi Abdominal: Soft, NT, ND, bowel sounds + Extremities: trace pretibial edema, no cyanosis   The results of significant diagnostics from this hospitalization (including  imaging, microbiology, ancillary and laboratory) are listed below for reference.     Microbiology: Recent Results (from the past 240 hours)  Resp panel by RT-PCR (RSV, Flu  A&B, Covid) Anterior Nasal Swab     Status: None   Collection Time: 11/18/23  2:12 PM   Specimen: Anterior Nasal Swab  Result Value Ref Range Status   SARS Coronavirus 2 by RT PCR NEGATIVE NEGATIVE Final    Comment: (NOTE) SARS-CoV-2 target nucleic acids are NOT DETECTED.  The SARS-CoV-2 RNA is generally detectable in upper respiratory specimens during the acute phase of infection. The lowest concentration of SARS-CoV-2 viral copies this assay can detect is 138 copies/mL. A negative result does not preclude SARS-Cov-2 infection and should not be used as the sole basis for treatment or other patient management decisions. A negative result may occur with  improper specimen collection/handling, submission of specimen other than nasopharyngeal swab, presence of viral mutation(s) within the areas targeted by this assay, and inadequate number of viral copies(<138 copies/mL). A negative result must be combined with clinical observations, patient history, and epidemiological information. The expected result is Negative.  Fact Sheet for Patients:  BloggerCourse.com  Fact Sheet for Healthcare Providers:  SeriousBroker.it  This test is no t yet approved or cleared by the [place]  FDA and  has been authorized for detection and/or diagnosis of SARS-CoV-2 by FDA under an Emergency Use Authorization (EUA). This EUA will remain  in effect (meaning this test can be used) for the duration of the COVID-19 declaration under Section 564(b)(1) of the Act, 21 U.S.C.section 360bbb-3(b)(1), unless the authorization is terminated  or revoked sooner.       Influenza A by PCR NEGATIVE NEGATIVE Final   Influenza B by PCR NEGATIVE NEGATIVE Final    Comment: (NOTE) The Xpert  Xpress SARS-CoV-2/FLU/RSV plus assay is intended as an aid in the diagnosis of influenza from Nasopharyngeal swab specimens and should not be used as a sole basis for treatment. Nasal washings and aspirates are unacceptable for Xpert Xpress SARS-CoV-2/FLU/RSV testing.  Fact Sheet for Patients: BloggerCourse.com  Fact Sheet for Healthcare Providers: SeriousBroker.it  This test is not yet approved or cleared by the [place]  FDA and has been authorized for detection and/or diagnosis of SARS-CoV-2 by FDA under an Emergency Use Authorization (EUA). This EUA will remain in effect (meaning this test can be used) for the duration of the COVID-19 declaration under Section 564(b)(1) of the Act, 21 U.S.C. section 360bbb-3(b)(1), unless the authorization is terminated or revoked.     Resp Syncytial Virus by PCR NEGATIVE NEGATIVE Final    Comment: (NOTE) Fact Sheet for Patients: BloggerCourse.com  Fact Sheet for Healthcare Providers: SeriousBroker.it  This test is not yet approved or cleared by the [place]  FDA and has been authorized for detection and/or diagnosis of SARS-CoV-2 by FDA under an Emergency Use Authorization (EUA). This EUA will remain in effect (meaning this test can be used) for the duration of the COVID-19 declaration under Section 564(b)(1) of the Act, 21 U.S.C. section 360bbb-3(b)(1), unless the authorization is terminated or revoked.  Performed at [facility], [address] [place], [place] [address]      Labs: BNP (last 3 results) Recent Labs    11/18/23 0904 11/20/23 0539  BNP 629.0* 159.0*   Basic Metabolic Panel: Recent Labs  Lab 11/18/23 0904 11/19/23 0454 11/20/23 0539 11/21/23 0408  NA 138 139 139 138  K 3.0* 3.3* 4.1 3.9  CL 107 110 110 107  CO2 17* 20* 22 22  GLUCOSE 199* 132* 127* 134*  BUN 39* 39* 44* 49*  CREATININE 4.02* 4.04*  4.17* 4.44*  CALCIUM  9.5 8.7* 8.9 9.1  MG 2.0 1.8 1.8 1.8  PHOS 3.8  --   --   --    Liver Function Tests: No results for input(s): "AST", "ALT", "ALKPHOS", "BILITOT", "PROT", "ALBUMIN" in the last 168 hours. No results for input(s): "LIPASE", "AMYLASE" in the last 168 hours. No results for input(s): "AMMONIA" in the last 168 hours. CBC: Recent Labs  Lab 11/18/23 0904 11/19/23 0454 11/19/23 0823 11/20/23 0539 11/21/23 0408  WBC 10.8* 7.6 7.2 7.9 8.3  NEUTROABS 9.6*  --   --   --   --   HGB 8.7* 6.7* 6.8* 8.4* 8.3*  HCT 26.5* 20.4* 21.2* 26.3* 25.4*  MCV 88.9 88.7 89.8 91.0 90.1  PLT 368 314 288 278 274   Cardiac Enzymes: No results for input(s): "CKTOTAL", "CKMB", "CKMBINDEX", "TROPONINI" in the last 168 hours. BNP: Invalid input(s): "POCBNP" CBG: Recent Labs  Lab 11/20/23 0813 11/20/23 1143 11/20/23 1611 11/20/23 2127 11/21/23 0810  GLUCAP 131* 189* 148* 214* 144*   D-Dimer No results for input(s): "DDIMER" in the last 72 hours. Hgb A1c No results for input(s): "HGBA1C" in the last 72 hours. Lipid Profile No results for input(s): "CHOL", "HDL", "LDLCALC", "TRIG", "CHOLHDL", "LDLDIRECT" in the last 72 hours. Thyroid  function studies No results for input(s): "TSH", "T4TOTAL", "T3FREE", "THYROIDAB" in the last 72 hours.  Invalid input(s): "FREET3" Anemia work up Recent Labs    11/19/23 1343  VITAMINB12 1,950*  FOLATE 14.1  FERRITIN 397*  TIBC 156*  IRON 36  RETICCTPCT 1.5   Urinalysis    Component Value Date/Time   COLORURINE YELLOW 09/28/2023 1939   APPEARANCEUR HAZY (A) 09/28/2023 1939   LABSPEC 1.014 09/28/2023 1939   PHURINE 6.0 09/28/2023 1939   GLUCOSEU >=500 (A) 09/28/2023 1939   HGBUR NEGATIVE 09/28/2023 1939   BILIRUBINUR NEGATIVE 09/28/2023 1939   KETONESUR NEGATIVE 09/28/2023 1939   PROTEINUR >=300 (A) 09/28/2023 1939   UROBILINOGEN 2.0 (H) 03/25/2015 2203   NITRITE NEGATIVE 09/28/2023 1939   LEUKOCYTESUR NEGATIVE 09/28/2023 1939    Sepsis Labs Recent Labs  Lab 11/19/23 0454 11/19/23 0823 11/20/23 0539 11/21/23 0408  WBC 7.6 7.2 7.9 8.3   Microbiology Recent Results (from the past 240 hours)  Resp panel by RT-PCR (RSV, Flu A&B, Covid) Anterior Nasal Swab     Status: None   Collection Time: 11/18/23  2:12 PM   Specimen: Anterior Nasal Swab  Result Value Ref Range Status   SARS Coronavirus 2 by RT PCR NEGATIVE NEGATIVE Final    Comment: (NOTE) SARS-CoV-2 target nucleic acids are NOT DETECTED.  The SARS-CoV-2 RNA is generally detectable in upper respiratory specimens during the acute phase of infection. The lowest concentration of SARS-CoV-2 viral copies this assay can detect is 138 copies/mL. A negative result does not preclude SARS-Cov-2 infection and should not be used as the sole basis for treatment or other patient management decisions. A negative result may occur with  improper specimen collection/handling, submission of specimen other than nasopharyngeal swab, presence of viral mutation(s) within the areas targeted by this assay, and inadequate number of viral copies(<138 copies/mL). A negative result must be combined with clinical observations, patient history, and epidemiological information. The expected result is Negative.  Fact Sheet for Patients:  BloggerCourse.com  Fact Sheet for Healthcare Providers:  SeriousBroker.it  This test is no t yet approved or cleared by the [place]  FDA and  has been authorized for detection and/or diagnosis of SARS-CoV-2 by FDA under an Emergency Use Authorization (EUA). This EUA will remain  in effect (  meaning this test can be used) for the duration of the COVID-19 declaration under Section 564(b)(1) of the Act, 21 U.S.C.section 360bbb-3(b)(1), unless the authorization is terminated  or revoked sooner.       Influenza A by PCR NEGATIVE NEGATIVE Final   Influenza B by PCR NEGATIVE NEGATIVE Final     Comment: (NOTE) The Xpert Xpress SARS-CoV-2/FLU/RSV plus assay is intended as an aid in the diagnosis of influenza from Nasopharyngeal swab specimens and should not be used as a sole basis for treatment. Nasal washings and aspirates are unacceptable for Xpert Xpress SARS-CoV-2/FLU/RSV testing.  Fact Sheet for Patients: BloggerCourse.com  Fact Sheet for Healthcare Providers: SeriousBroker.it  This test is not yet approved or cleared by the [place]  FDA and has been authorized for detection and/or diagnosis of SARS-CoV-2 by FDA under an Emergency Use Authorization (EUA). This EUA will remain in effect (meaning this test can be used) for the duration of the COVID-19 declaration under Section 564(b)(1) of the Act, 21 U.S.C. section 360bbb-3(b)(1), unless the authorization is terminated or revoked.     Resp Syncytial Virus by PCR NEGATIVE NEGATIVE Final    Comment: (NOTE) Fact Sheet for Patients: BloggerCourse.com  Fact Sheet for Healthcare Providers: SeriousBroker.it  This test is not yet approved or cleared by the [place]  FDA and has been authorized for detection and/or diagnosis of SARS-CoV-2 by FDA under an Emergency Use Authorization (EUA). This EUA will remain in effect (meaning this test can be used) for the duration of the COVID-19 declaration under Section 564(b)(1) of the Act, 21 U.S.C. section 360bbb-3(b)(1), unless the authorization is terminated or revoked.  Performed at [facility], [address], [place], [place] [address]    Time coordinating discharge:  44 mins   SIGNED:  Faustino Hook, MD  [facility] 11/21/2023, 11:54 AM How to contact the [facility] Attending or Consulting provider 7A - 7P or covering provider during after hours 7P -7A, for this patient?  Check the care team in [facility] and look for a) attending/consulting TR[facility] provider listed and b)  the TRH team listed Log into [email or web address] and use [place]'s universal password to access. If you do not have the password, please contact the hospital operator. Locate the TRH provider you are looking for under [facility] and page to a number that you can be directly reached. If you still have difficulty reaching the provider, please page the [facility] (Director on Call) for the Hospitalists listed on amion for assistance.

## 2023-11-21 NOTE — TOC Transition Note (Addendum)
 Transition of Care [facility]) - Discharge Note   Patient Details  Name: Maria Parrish MRN: [id number] Date of Birth: [birth year]  Transition of [facility]) CM/SW Contact:  Orelia Binet, RN Phone Number: 11/21/2023, 12:44 PM   Clinical Narrative:   Patient discharging home with home health. PT is recommending HHPT. Patient states she has used [facility] in the past. Referral sent to [facility], she accepted. Orders placed and information add to AVS for discharge.   Addendum : Referral made to Out Patient Palliative with [facility] as requested by Oakley Bellman NP.  Final next level of care: Home w Hospice Care Barriers to Discharge: Barriers Resolved   Patient Goals and CMS Choice Patient states their goals for this hospitalization and ongoing recovery are:: palliative to meet with patient and family  Discharge Placement      Patient and family notified of of transfer: 11/21/23  Discharge Plan and Services Additional resources added to the After Visit Summary for        Social Drivers of Health (SDOH) Interventions SDOH Screenings   Food Insecurity: No Food Insecurity (11/18/2023)  Housing: Low Risk  (11/18/2023)  Transportation Needs: No Transportation Needs (11/18/2023)  Utilities: Not At Risk (11/18/2023)  Depression (PHQ2-9): Low Risk  (09/14/2023)  Physical Activity: Inactive (08/20/2020)  Social Connections: Moderately Integrated (11/18/2023)  Tobacco Use: Low Risk  (11/19/2023)     Readmission Risk Interventions    11/19/2023    3:13 PM 09/29/2023    4:26 PM  Readmission Risk Prevention Plan  Transportation Screening Complete Complete  PCP or Specialist Appt within 3-5 Days Not Complete Complete  HRI or Home Care Consult Complete Complete  Social Work Consult for Recovery Care Planning/Counseling Not Complete Complete  Palliative Care Screening Not Complete Complete  Medication Review Oceanographer) Complete Complete

## 2023-11-21 NOTE — Care Management Important Message (Signed)
 Important Message  Patient Details  Name: Maria Parrish MRN: [id number] Date of Birth: 10-Jun-1946   Important Message Given:  N/A - LOS <3 / Initial given by admissions     Neila Bally 11/21/2023, 11:13 AM

## 2023-11-23 NOTE — Telephone Encounter (Signed)
 Tried to call pt's son, did not received an answer and was unable to leave a message.

## 2023-11-26 NOTE — Telephone Encounter (Signed)
 Tried to call pt's son, did not receive an answer and was unable to leave a message.

## 2023-11-28 NOTE — Telephone Encounter (Signed)
 Tried to return call to pt's son at both telephone numbers listed but did not receive an asnwer at either numbers and was unable to leave a message at either numbers due to the voice mails being full.

## 2023-12-03 ENCOUNTER — Other Ambulatory Visit: Payer: Self-pay | Admitting: "Endocrinology

## 2023-12-03 DIAGNOSIS — E1122 Type 2 diabetes mellitus with diabetic chronic kidney disease: Secondary | ICD-10-CM

## 2023-12-25 ENCOUNTER — Ambulatory Visit (INDEPENDENT_AMBULATORY_CARE_PROVIDER_SITE_OTHER): Admitting: "Endocrinology

## 2023-12-25 ENCOUNTER — Encounter: Payer: Self-pay | Admitting: "Endocrinology

## 2023-12-25 VITALS — BP 148/66 | HR 60 | Ht 64.0 in | Wt 147.0 lb

## 2023-12-25 DIAGNOSIS — E049 Nontoxic goiter, unspecified: Secondary | ICD-10-CM

## 2023-12-25 DIAGNOSIS — E1122 Type 2 diabetes mellitus with diabetic chronic kidney disease: Secondary | ICD-10-CM

## 2023-12-25 DIAGNOSIS — I10 Essential (primary) hypertension: Secondary | ICD-10-CM | POA: Diagnosis not present

## 2023-12-25 DIAGNOSIS — N185 Chronic kidney disease, stage 5: Secondary | ICD-10-CM

## 2023-12-25 DIAGNOSIS — E782 Mixed hyperlipidemia: Secondary | ICD-10-CM

## 2023-12-25 DIAGNOSIS — Z794 Long term (current) use of insulin: Secondary | ICD-10-CM

## 2023-12-25 NOTE — Progress Notes (Signed)
 12/25/2023  Endocrinology follow-up note  Subjective:    Patient ID: Maria Parrish, female    DOB: September 21, 1945, PCP Ayesha Lente, FNP   Past Medical History:  Diagnosis Date   Assistance needed for mobility    walks with walker   Confusion    Dementia (HCC)    Diabetes mellitus without complication (HCC)    Generalized weakness    Heart failure with improved ejection fraction (HFimpEF) (HCC)    a. EF 40-45% by echo in 04/2020 b. normalized to 55-60% by echo in 06/2021   Hypercalcemia    Hyperlipidemia    Hyperparathyroidism (HCC)    Hypertension    Hypertension    Moderate protein-calorie malnutrition (HCC)    Peripheral neuropathy    UTI (lower urinary tract infection)    Past Surgical History:  Procedure Laterality Date   COLONOSCOPY N/A 08/12/2015   Procedure: COLONOSCOPY;  Surgeon: Suzette Espy, MD;  Location: [facility];  Service: Endoscopy;  Laterality: N/A;  1245pm   ESOPHAGOGASTRODUODENOSCOPY N/A 08/12/2015   Procedure: ESOPHAGOGASTRODUODENOSCOPY (EGD);  Surgeon: Suzette Espy, MD;  Location: [facility];  Service: Endoscopy;  Laterality: N/A;   none     Social History   Socioeconomic History   Marital status: Widowed    Spouse name: Not on file   Number of children: 2   Years of education: Not on file   Highest education level: Not on file  Occupational History   Occupation: fabricator for [employer]  Tobacco Use   Smoking status: Never   Smokeless tobacco: Never  Vaping Use   Vaping status: Never Used  Substance and Sexual Activity   Alcohol use: No   Drug use: No   Sexual activity: Not Currently  Other Topics Concern   Not on file  Social History Narrative   Not on file   Social Drivers of Health   Financial Resource Strain: Not on file  Food Insecurity: No Food Insecurity (11/18/2023)   Hunger Vital Sign    Worried About Running Out of Food in the Last Year: Never true    Ran Out of Food in the Last Year: Never true  Transportation Needs: No  Transportation Needs (11/18/2023)   PRAPARE - Administrator, Civil Service (Medical): No    Lack of Transportation (Non-Medical): No  Physical Activity: Inactive (08/20/2020)   Exercise Vital Sign    Days of Exercise per Week: 0 days    Minutes of Exercise per Session: 0 min  Stress: Not on file  Social Connections: Moderately Integrated (11/18/2023)   Social Connection and Isolation Panel [NHANES]    Frequency of Communication with Friends and Family: More than three times a week    Frequency of Social Gatherings with Friends and Family: Once a week    Attends Religious Services: 1 to 4 times per year    Active Member of [employer] or Organizations: Yes    Attends Banker Meetings: 1 to 4 times per year    Marital Status: Widowed   Outpatient Encounter Medications as of 12/25/2023  Medication Sig   insulin  degludec (TRESIBA  FLEXTOUCH) 100 UNIT/ML FlexTouch Pen Inject 28 Units into the skin at bedtime.   Accu-Chek Softclix Lancets lancets Use to check blood glucose twice daily as instructed   albuterol  (VENTOLIN  HFA) 108 (90 Base) MCG/ACT inhaler Inhale 2 puffs into the lungs every 6 (six) hours as needed for wheezing or shortness of breath.   Alcohol Swabs (DROPSAFE  ALCOHOL PREP) 70 % PADS USE TWICE DAILY AS INSTRUCTED   amLODipine  (NORVASC ) 10 MG tablet Take 1 tablet by mouth once daily   B-D ULTRAFINE III SHORT PEN 31G X 8 MM MISC ISE 1 PEN NEEDLE ONCE DAILY AS DIRECTED   Blood Glucose Monitoring Suppl (ACCU-CHEK GUIDE ME) w/Device KIT Use to check blood glucose twice daily as directed.   carvedilol  (COREG ) 12.5 MG tablet Take 1 tablet by mouth twice daily (Patient taking differently: Take 12.5 mg by mouth daily.)   Cyanocobalamin  (VITAMIN B-12 PO) Take 1 tablet by mouth daily.   epoetin  alfa-epbx (RETACRIT ) 4000 UNIT/ML injection Inject 4,000 Units into the skin 2 (two) times a week. Friday   ezetimibe  (ZETIA ) 10 MG tablet Take 1 tablet (10 mg total) by mouth daily.    ferrous sulfate  325 (65 FE) MG tablet Take 325 mg by mouth daily with breakfast.   glucose blood (ACCU-CHEK GUIDE TEST) test strip Use to check blood glucose twice daily as directed.   hydrALAZINE  (APRESOLINE ) 100 MG tablet Take 0.5 tablets (50 mg total) by mouth 3 (three) times daily.   isosorbide  mononitrate (IMDUR ) 30 MG 24 hr tablet Take 1 tablet (30 mg total) by mouth daily.   pantoprazole  (PROTONIX ) 40 MG tablet Take 1 tablet (40 mg total) by mouth daily.   sodium bicarbonate  650 MG tablet Take 1 tablet (650 mg total) by mouth 2 (two) times daily.   torsemide  (DEMADEX ) 20 MG tablet Take 1 tablet (20 mg total) by mouth 2 (two) times daily.   No facility-administered encounter medications on file as of 12/25/2023.   ALLERGIES: Allergies  Allergen Reactions   Statins Other (See Comments)    Makes wrists and fingers ache Muscle Pain and Joint Pain   Penicillins Other (See Comments)    Makes patient sore.   VACCINATION STATUS:  There is no immunization history on file for this patient.  Diabetes   -78 year old female patient with medical history as above.   She was originally seen for secondary hyperparathyroidism with elevated PTH as a result of CKD-now worsening to stage 5, on regular follow-up with nephrology.  she declined renal replacement therapy with hemodialysis.  -She is being seen in follow-up for management of her currently and chronically  uncontrolled, type 2 diabetes.    She continues to struggle with control of her diabetes care. After reluctance for several months, she accepted low-dose basal insulin .  During her last visit, she was advised to take Tresiba  24 units nightly.   She presents with average blood glucose of 217 over the last 7 days, 190 for the last 14 days.  Her previsit labs show A1c of 6.9%, however this patient has anemia as a result of her end-stage renal disease which might have undermined her A1c.  She is not monitoring blood glucose optimally.   She  denies any hypoglycemia.  She worries about hypoglycemia.    Due to worsening CKD following up with nephrology.  She reports that she was approached for dialysis, however declined it.    She has no new complaints today.  - Her thyroid  ultrasound shows mild goiter with no discrete nodules. - She has no new complaints.  Review of Systems Limited as above.   Objective:    BP (!) 148/66   Pulse 60   Ht 5\' 4"  (1.626 m)   Wt 147 lb (66.7 kg)   BMI 25.23 kg/m   Wt Readings from Last 3 Encounters:  12/25/23 147 lb (66.7 kg)  11/21/23 150 lb 5.7 oz (68.2 kg)  09/28/23 142 lb 10.2 oz (64.7 kg)        Latest Ref Rng & Units 11/21/2023    4:08 AM 11/20/2023    5:39 AM 11/19/2023    4:54 AM  CMP  Glucose 70 - 99 mg/dL 811  914  782   BUN 8 - 23 mg/dL 49  44  39   Creatinine 0.44 - 1.00 mg/dL 9.56  2.13  0.86   Sodium 135 - 145 mmol/L 138  139  139   Potassium 3.5 - 5.1 mmol/L 3.9  4.1  3.3   Chloride 98 - 111 mmol/L 107  110  110   CO2 22 - 32 mmol/L 22  22  20    Calcium  8.9 - 10.3 mg/dL 9.1  8.9  8.7      Diabetic Labs (most recent): Lab Results  Component Value Date   HGBA1C 6.9 (H) 09/29/2023   HGBA1C 7.9 (A) 07/02/2023   HGBA1C 8.3 (A) 02/27/2023   Lipid Panel     Component Value Date/Time   CHOL 243 (H) 07/13/2022 1602   CHOL 212 (H) 12/29/2021 1303   TRIG 185 (H) 07/13/2022 1602   HDL 38 (L) 07/13/2022 1602   HDL 40 12/29/2021 1303   CHOLHDL 6.4 07/13/2022 1602   VLDL 37 07/13/2022 1602   LDLCALC 168 (H) 07/13/2022 1602   LDLCALC 145 (H) 12/29/2021 1303   LABVLDL 27 12/29/2021 1303      Assessment & Plan:   1. 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  She does not monitor blood glucose for safe adjustment of insulin . She worries about hypoglycemia.  In light of her presentation with still above target glycemic profile , she is advised to increase her Tresiba  to 28 units nightly associated with monitoring of blood  glucose twice daily-daily before breakfast and at bedtime.    Note that this patient has anemia related to her CKD which might have undermined her A1c she presents with average blood glucose of 217 for the last 7 days.   Avoiding hypoglycemia is a priority in this patient. She is not a candidate for metformin  nor SGLT2 inhibitors. -She does not have a safe oral regimen to manage diabetes. Patient is known to be alarmingly noncompliant/nonadherent. Her renal function is worsening, GFR below 10, patient is a candidate for dialysis.  She thinks her kidneys are  "fine due to the fact that she does not feel any symptoms".  Reportedly, she is declining this option.  I highly encouraged her to discuss it further with her nephrologist .  - she acknowledges that there is a room for improvement in her food and drink choices. - Suggestion is made for her to avoid simple carbohydrates  from her diet including Cakes, Sweet Desserts, Ice Cream, Soda (diet and regular), Sweet Tea, Candies, Chips, Cookies, Store Bought Juices, Alcohol in Excess of  1-2 drinks a day, Artificial Sweeteners,  Coffee Creamer, and "Sugar-free" Products, Lemonade. This will help patient to have more stable blood glucose profile and potentially avoid unintended weight gain.  2. Hypercalcemia: Resolved  - Her repeat labs show normal calcium  of 9.1, associated with high PTH,  due to her advancing CKD.  - She is vitamin D  replete now . -She is encouraged to stay close to her nephrologist.   3. Essential hypertension  -Her blood pressure is not controlled to target.  She is advised to  continue lisinopril  30 mg p.o. daily at breakfast,  metoprolol  25 mg p.o. daily, isosorbide  30 mg p.o. daily, hydralazine  100 mg p.o. twice daily, amlodipine  5 mg p.o. daily, and Lasix  as needed.  She will not need more prescriptions.  She is advised to be consistent with her available medications.  She was given refills on her  isosorbide  based on her  request.   - She is hesitant to add any additional therapy, but willing to discuss this with her PMD.   4. Goiter -Her thyroid  ultrasound showed mild goiter with no discrete nodules. No intervention needed at this point.    - I advised patient to maintain close follow up with Ayesha Lente, FNP for primary care needs.   I spent  25  minutes in the care of the patient today including review of labs from CMP, Lipids, Thyroid  Function, Hematology (current and previous including abstractions from other facilities); face-to-face time discussing  her blood glucose readings/logs, discussing hypoglycemia and hyperglycemia episodes and symptoms, medications doses, her options of short and long term treatment based on the latest standards of care / guidelines;  discussion about incorporating lifestyle medicine;  and documenting the encounter. Risk reduction counseling performed per USPSTF guidelines to reduce cardiovascular risk factors.     Please refer to Patient Instructions for Blood Glucose Monitoring and Insulin /Medications Dosing Guide"  in media tab for additional information. Please  also refer to " Patient Self Inventory" in the Media  tab for reviewed elements of pertinent patient history.  Maria Parrish participated in the discussions, expressed understanding, and voiced agreement with the above plans.  All questions were answered to her satisfaction. she is encouraged to contact clinic should she have any questions or concerns prior to her return visit.   Follow up plan: Return in about 3 months (around 03/26/2024) for Bring Meter/CGM Device/Logs- A1c in Office.  Kalvin Orf, MD Phone: [phone]  Fax: [phone]  -  This note was partially dictated with voice recognition software. Similar sounding words can be transcribed inadequately or may not  be corrected upon review.  12/25/2023, 4:37 PM

## 2023-12-25 NOTE — Patient Instructions (Signed)

## 2023-12-30 ENCOUNTER — Other Ambulatory Visit: Payer: Self-pay | Admitting: "Endocrinology

## 2024-01-28 ENCOUNTER — Other Ambulatory Visit: Payer: Self-pay

## 2024-01-28 ENCOUNTER — Emergency Department (HOSPITAL_COMMUNITY)

## 2024-01-28 ENCOUNTER — Inpatient Hospital Stay (HOSPITAL_COMMUNITY)
Admission: EM | Admit: 2024-01-28 | Discharge: 2024-02-02 | DRG: 682 | Disposition: A | Discharge status: Skilled Nursing Facility | Attending: Family Medicine | Admitting: Family Medicine

## 2024-01-28 ENCOUNTER — Encounter (HOSPITAL_COMMUNITY): Payer: Self-pay | Admitting: Emergency Medicine

## 2024-01-28 DIAGNOSIS — M6282 Rhabdomyolysis: Secondary | ICD-10-CM | POA: Diagnosis present

## 2024-01-28 DIAGNOSIS — E8809 Other disorders of plasma-protein metabolism, not elsewhere classified: Secondary | ICD-10-CM | POA: Diagnosis present

## 2024-01-28 DIAGNOSIS — G9341 Metabolic encephalopathy: Secondary | ICD-10-CM | POA: Diagnosis present

## 2024-01-28 DIAGNOSIS — W06XXXA Fall from bed, initial encounter: Secondary | ICD-10-CM | POA: Diagnosis present

## 2024-01-28 DIAGNOSIS — Z888 Allergy status to other drugs, medicaments and biological substances status: Secondary | ICD-10-CM

## 2024-01-28 DIAGNOSIS — Z79899 Other long term (current) drug therapy: Secondary | ICD-10-CM

## 2024-01-28 DIAGNOSIS — E119 Type 2 diabetes mellitus without complications: Secondary | ICD-10-CM | POA: Diagnosis not present

## 2024-01-28 DIAGNOSIS — Z91A28 Caregiver's intentional underdosing of medication regimen for other reason: Secondary | ICD-10-CM

## 2024-01-28 DIAGNOSIS — E876 Hypokalemia: Secondary | ICD-10-CM | POA: Diagnosis present

## 2024-01-28 DIAGNOSIS — R531 Weakness: Secondary | ICD-10-CM

## 2024-01-28 DIAGNOSIS — M4856XA Collapsed vertebra, not elsewhere classified, lumbar region, initial encounter for fracture: Secondary | ICD-10-CM | POA: Diagnosis present

## 2024-01-28 DIAGNOSIS — N178 Other acute kidney failure: Secondary | ICD-10-CM

## 2024-01-28 DIAGNOSIS — Z794 Long term (current) use of insulin: Secondary | ICD-10-CM

## 2024-01-28 DIAGNOSIS — Z8249 Family history of ischemic heart disease and other diseases of the circulatory system: Secondary | ICD-10-CM

## 2024-01-28 DIAGNOSIS — E1165 Type 2 diabetes mellitus with hyperglycemia: Secondary | ICD-10-CM | POA: Diagnosis present

## 2024-01-28 DIAGNOSIS — J181 Lobar pneumonia, unspecified organism: Secondary | ICD-10-CM | POA: Diagnosis not present

## 2024-01-28 DIAGNOSIS — Y92009 Unspecified place in unspecified non-institutional (private) residence as the place of occurrence of the external cause: Secondary | ICD-10-CM

## 2024-01-28 DIAGNOSIS — Z1152 Encounter for screening for COVID-19: Secondary | ICD-10-CM | POA: Diagnosis not present

## 2024-01-28 DIAGNOSIS — E782 Mixed hyperlipidemia: Secondary | ICD-10-CM | POA: Diagnosis present

## 2024-01-28 DIAGNOSIS — N179 Acute kidney failure, unspecified: Principal | ICD-10-CM | POA: Diagnosis present

## 2024-01-28 DIAGNOSIS — Z833 Family history of diabetes mellitus: Secondary | ICD-10-CM

## 2024-01-28 DIAGNOSIS — Z880 Allergy status to penicillin: Secondary | ICD-10-CM

## 2024-01-28 DIAGNOSIS — D631 Anemia in chronic kidney disease: Secondary | ICD-10-CM | POA: Diagnosis present

## 2024-01-28 DIAGNOSIS — Z66 Do not resuscitate: Secondary | ICD-10-CM | POA: Diagnosis present

## 2024-01-28 DIAGNOSIS — I160 Hypertensive urgency: Secondary | ICD-10-CM | POA: Diagnosis present

## 2024-01-28 DIAGNOSIS — J189 Pneumonia, unspecified organism: Principal | ICD-10-CM

## 2024-01-28 DIAGNOSIS — Z515 Encounter for palliative care: Secondary | ICD-10-CM

## 2024-01-28 DIAGNOSIS — J690 Pneumonitis due to inhalation of food and vomit: Secondary | ICD-10-CM | POA: Diagnosis present

## 2024-01-28 DIAGNOSIS — E1122 Type 2 diabetes mellitus with diabetic chronic kidney disease: Secondary | ICD-10-CM | POA: Diagnosis present

## 2024-01-28 DIAGNOSIS — F0390 Unspecified dementia without behavioral disturbance: Secondary | ICD-10-CM | POA: Diagnosis present

## 2024-01-28 DIAGNOSIS — I340 Nonrheumatic mitral (valve) insufficiency: Secondary | ICD-10-CM | POA: Diagnosis present

## 2024-01-28 DIAGNOSIS — I132 Hypertensive heart and chronic kidney disease with heart failure and with stage 5 chronic kidney disease, or end stage renal disease: Secondary | ICD-10-CM | POA: Diagnosis present

## 2024-01-28 DIAGNOSIS — E872 Acidosis, unspecified: Secondary | ICD-10-CM | POA: Diagnosis present

## 2024-01-28 DIAGNOSIS — T50916A Underdosing of multiple unspecified drugs, medicaments and biological substances, initial encounter: Secondary | ICD-10-CM | POA: Diagnosis present

## 2024-01-28 DIAGNOSIS — J449 Chronic obstructive pulmonary disease, unspecified: Secondary | ICD-10-CM | POA: Diagnosis present

## 2024-01-28 DIAGNOSIS — I5032 Chronic diastolic (congestive) heart failure: Secondary | ICD-10-CM | POA: Diagnosis present

## 2024-01-28 DIAGNOSIS — N185 Chronic kidney disease, stage 5: Secondary | ICD-10-CM | POA: Diagnosis present

## 2024-01-28 DIAGNOSIS — E860 Dehydration: Secondary | ICD-10-CM | POA: Diagnosis present

## 2024-01-28 DIAGNOSIS — W19XXXA Unspecified fall, initial encounter: Secondary | ICD-10-CM

## 2024-01-28 DIAGNOSIS — I10 Essential (primary) hypertension: Secondary | ICD-10-CM

## 2024-01-28 DIAGNOSIS — Z7189 Other specified counseling: Secondary | ICD-10-CM | POA: Diagnosis not present

## 2024-01-28 LAB — LACTIC ACID, PLASMA
Lactic Acid, Venous: 1.3 mmol/L (ref 0.5–1.9)
Lactic Acid, Venous: 1.7 mmol/L (ref 0.5–1.9)

## 2024-01-28 LAB — GLUCOSE, CAPILLARY
Glucose-Capillary: 166 mg/dL — ABNORMAL HIGH (ref 70–99)
Glucose-Capillary: 155 mg/dL — ABNORMAL HIGH (ref 70–99)

## 2024-01-28 LAB — RESP PANEL BY RT-PCR (RSV, FLU A&B, COVID)  RVPGX2
Influenza A by PCR: NEGATIVE
Influenza B by PCR: NEGATIVE
Resp Syncytial Virus by PCR: NEGATIVE
SARS Coronavirus 2 by RT PCR: NEGATIVE

## 2024-01-28 LAB — COMPREHENSIVE METABOLIC PANEL WITH GFR
ALT: 17 U/L (ref 0–44)
AST: 21 U/L (ref 15–41)
Albumin: 3.1 g/dL — ABNORMAL LOW (ref 3.5–5.0)
Alkaline Phosphatase: 57 U/L (ref 38–126)
Anion gap: 19 — ABNORMAL HIGH (ref 5–15)
BUN: 52 mg/dL — ABNORMAL HIGH (ref 8–23)
CO2: 17 mmol/L — ABNORMAL LOW (ref 22–32)
Calcium: 9.3 mg/dL (ref 8.9–10.3)
Chloride: 106 mmol/L (ref 98–111)
Creatinine, Ser: 5.97 mg/dL — ABNORMAL HIGH (ref 0.44–1.00)
GFR, Estimated: 7 mL/min — ABNORMAL LOW (ref >60)
Glucose, Bld: 168 mg/dL — ABNORMAL HIGH (ref 70–99)
Potassium: 2.3 mmol/L — CL (ref 3.5–5.1)
Sodium: 142 mmol/L (ref 135–145)
Total Bilirubin: 0.9 mg/dL (ref 0.0–1.2)
Total Protein: 7 g/dL (ref 6.5–8.1)

## 2024-01-28 LAB — CBC WITH DIFFERENTIAL/PLATELET
Abs Immature Granulocytes: 0.06 K/uL (ref 0.00–0.07)
Basophils Absolute: 0 K/uL (ref 0.0–0.1)
Basophils Relative: 0 %
Eosinophils Absolute: 0 K/uL (ref 0.0–0.5)
Eosinophils Relative: 0 %
HCT: 23.9 % — ABNORMAL LOW (ref 36.0–46.0)
Hemoglobin: 8.5 g/dL — ABNORMAL LOW (ref 12.0–15.0)
Immature Granulocytes: 1 %
Lymphocytes Relative: 7 %
Lymphs Abs: 0.8 K/uL (ref 0.7–4.0)
MCH: 30.4 pg (ref 26.0–34.0)
MCHC: 35.6 g/dL (ref 30.0–36.0)
MCV: 85.4 fL (ref 80.0–100.0)
Monocytes Absolute: 0.5 K/uL (ref 0.1–1.0)
Monocytes Relative: 4 %
Neutro Abs: 9.9 K/uL — ABNORMAL HIGH (ref 1.7–7.7)
Neutrophils Relative %: 88 %
Platelets: 396 K/uL (ref 150–400)
RBC: 2.8 MIL/uL — ABNORMAL LOW (ref 3.87–5.11)
RDW: 15.2 % (ref 11.5–15.5)
WBC: 11.3 K/uL — ABNORMAL HIGH (ref 4.0–10.5)
nRBC: 0 % (ref 0.0–0.2)

## 2024-01-28 LAB — CK: Total CK: 496 U/L — ABNORMAL HIGH (ref 38–234)

## 2024-01-28 LAB — VITAMIN B12: Vitamin B-12: 1260 pg/mL — ABNORMAL HIGH (ref 180–914)

## 2024-01-28 LAB — PROCALCITONIN: Procalcitonin: 0.37 ng/mL

## 2024-01-28 LAB — TROPONIN I (HIGH SENSITIVITY)
Troponin I (High Sensitivity): 45 ng/L — ABNORMAL HIGH (ref <18)
Troponin I (High Sensitivity): 44 ng/L — ABNORMAL HIGH (ref <18)

## 2024-01-28 LAB — TSH: TSH: 1.211 u[IU]/mL (ref 0.350–4.500)

## 2024-01-28 LAB — MAGNESIUM: Magnesium: 2 mg/dL (ref 1.7–2.4)

## 2024-01-28 LAB — AMMONIA: Ammonia: 19 umol/L (ref 9–35)

## 2024-01-28 LAB — FOLATE: Folate: 14.4 ng/mL (ref >5.9)

## 2024-01-28 LAB — T4, FREE: Free T4: 1.11 ng/dL (ref 0.61–1.12)

## 2024-01-28 LAB — PROTIME-INR
INR: 1.2 (ref 0.8–1.2)
Prothrombin Time: 15.4 s — ABNORMAL HIGH (ref 11.4–15.2)

## 2024-01-28 MED ORDER — SODIUM BICARBONATE 650 MG PO TABS
650.0000 mg | ORAL_TABLET | Freq: Two times a day (BID) | ORAL | Status: DC
  Administered 2024-01-28 – 2024-01-31 (×7): 650 mg via ORAL
  Filled 2024-01-28 (×8): qty 1

## 2024-01-28 MED ORDER — PANTOPRAZOLE SODIUM 40 MG PO TBEC
40.0000 mg | DELAYED_RELEASE_TABLET | Freq: Every day | ORAL | Status: DC
  Administered 2024-01-29 – 2024-02-02 (×5): 40 mg via ORAL
  Filled 2024-01-28 (×5): qty 1

## 2024-01-28 MED ORDER — LACTATED RINGERS IV BOLUS (SEPSIS)
1000.0000 mL | Freq: Once | INTRAVENOUS | Status: AC
  Administered 2024-01-28: 1000 mL via INTRAVENOUS

## 2024-01-28 MED ORDER — NITROGLYCERIN 2 % TD OINT
1.0000 [in_us] | TOPICAL_OINTMENT | Freq: Once | TRANSDERMAL | Status: AC
  Administered 2024-01-28: 1 [in_us] via TOPICAL
  Filled 2024-01-28: qty 1

## 2024-01-28 MED ORDER — INSULIN ASPART 100 UNIT/ML IJ SOLN
0.0000 [IU] | Freq: Three times a day (TID) | INTRAMUSCULAR | Status: DC
  Administered 2024-01-28 – 2024-01-29 (×2): 2 [IU] via SUBCUTANEOUS
  Administered 2024-01-29: 3 [IU] via SUBCUTANEOUS
  Administered 2024-01-30 – 2024-01-31 (×4): 2 [IU] via SUBCUTANEOUS
  Administered 2024-02-01: 1 [IU] via SUBCUTANEOUS
  Administered 2024-02-01: 2 [IU] via SUBCUTANEOUS

## 2024-01-28 MED ORDER — SODIUM CHLORIDE 0.9 % IV SOLN
1.0000 g | Freq: Once | INTRAVENOUS | Status: AC
  Administered 2024-01-28: 1 g via INTRAVENOUS
  Filled 2024-01-28: qty 10

## 2024-01-28 MED ORDER — SODIUM CHLORIDE 0.9 % IV SOLN: INTRAVENOUS | Status: DC

## 2024-01-28 MED ORDER — DOXYCYCLINE HYCLATE 100 MG PO TABS
100.0000 mg | ORAL_TABLET | Freq: Two times a day (BID) | ORAL | Status: AC
  Administered 2024-01-28 – 2024-02-01 (×9): 100 mg via ORAL
  Filled 2024-01-28 (×9): qty 1

## 2024-01-28 MED ORDER — AMLODIPINE BESYLATE 5 MG PO TABS
10.0000 mg | ORAL_TABLET | Freq: Every day | ORAL | Status: DC
  Administered 2024-01-28 – 2024-02-02 (×6): 10 mg via ORAL
  Filled 2024-01-28 (×6): qty 2

## 2024-01-28 MED ORDER — ACETAMINOPHEN 650 MG RE SUPP: 650.0000 mg | Freq: Four times a day (QID) | RECTAL | Status: DC | PRN

## 2024-01-28 MED ORDER — POTASSIUM CHLORIDE 10 MEQ/100ML IV SOLN
10.0000 meq | INTRAVENOUS | Status: AC
  Administered 2024-01-28 (×2): 10 meq via INTRAVENOUS
  Filled 2024-01-28 (×2): qty 100

## 2024-01-28 MED ORDER — INSULIN ASPART 100 UNIT/ML IJ SOLN: 0.0000 [IU] | Freq: Every day | INTRAMUSCULAR | Status: DC

## 2024-01-28 MED ORDER — ACETAMINOPHEN 325 MG PO TABS
650.0000 mg | ORAL_TABLET | Freq: Four times a day (QID) | ORAL | Status: DC | PRN
Start: 2024-01-28 — End: 2024-02-02

## 2024-01-28 MED ORDER — POTASSIUM CHLORIDE CRYS ER 20 MEQ PO TBCR
40.0000 meq | EXTENDED_RELEASE_TABLET | Freq: Once | ORAL | Status: AC
  Administered 2024-01-28: 40 meq via ORAL
  Filled 2024-01-28: qty 2

## 2024-01-28 MED ORDER — FERROUS SULFATE 325 (65 FE) MG PO TABS
325.0000 mg | ORAL_TABLET | Freq: Every day | ORAL | Status: DC
  Administered 2024-01-29 – 2024-02-02 (×5): 325 mg via ORAL
  Filled 2024-01-28 (×5): qty 1

## 2024-01-28 MED ORDER — DOXYCYCLINE HYCLATE 100 MG PO TABS
100.0000 mg | ORAL_TABLET | Freq: Once | ORAL | Status: AC
Start: 2024-01-28 — End: 2024-01-28
  Administered 2024-01-28: 100 mg via ORAL
  Filled 2024-01-28: qty 1

## 2024-01-28 MED ORDER — HEPARIN SODIUM (PORCINE) 5000 UNIT/ML IJ SOLN
5000.0000 [IU] | Freq: Three times a day (TID) | INTRAMUSCULAR | Status: DC
  Administered 2024-01-28 – 2024-02-02 (×14): 5000 [IU] via SUBCUTANEOUS
  Filled 2024-01-28 (×13): qty 1

## 2024-01-28 MED ORDER — HYDRALAZINE HCL 25 MG PO TABS
50.0000 mg | ORAL_TABLET | Freq: Three times a day (TID) | ORAL | Status: DC
  Administered 2024-01-28 – 2024-02-02 (×15): 50 mg via ORAL
  Filled 2024-01-28 (×15): qty 2

## 2024-01-28 MED ORDER — CARVEDILOL 12.5 MG PO TABS
12.5000 mg | ORAL_TABLET | Freq: Every day | ORAL | Status: DC
  Administered 2024-01-28: 12.5 mg via ORAL
  Filled 2024-01-28: qty 1

## 2024-01-28 MED ORDER — SODIUM CHLORIDE 0.9 % IV SOLN
1.0000 g | INTRAVENOUS | Status: DC
  Administered 2024-01-29 – 2024-01-30 (×2): 1 g via INTRAVENOUS
  Filled 2024-01-28 (×4): qty 10

## 2024-01-28 MED ORDER — SODIUM CHLORIDE 0.9 % IV SOLN
2.0000 g | Freq: Once | INTRAVENOUS | Status: AC
  Administered 2024-01-28: 2 g via INTRAVENOUS
  Filled 2024-01-28: qty 12.5

## 2024-01-28 MED ORDER — POTASSIUM CHLORIDE CRYS ER 20 MEQ PO TBCR
20.0000 meq | EXTENDED_RELEASE_TABLET | Freq: Once | ORAL | Status: AC
Start: 2024-01-28 — End: 2024-01-28
  Administered 2024-01-28: 20 meq via ORAL
  Filled 2024-01-28: qty 1

## 2024-01-28 MED ORDER — ISOSORBIDE MONONITRATE ER 30 MG PO TB24
30.0000 mg | ORAL_TABLET | Freq: Every day | ORAL | Status: DC
  Administered 2024-01-28 – 2024-02-02 (×6): 30 mg via ORAL
  Filled 2024-01-28 (×6): qty 1

## 2024-01-28 MED ORDER — POTASSIUM CHLORIDE 10 MEQ/100ML IV SOLN
10.0000 meq | INTRAVENOUS | Status: DC
  Administered 2024-01-28 (×2): 10 meq via INTRAVENOUS
  Filled 2024-01-28 (×2): qty 100

## 2024-01-28 MED ORDER — ONDANSETRON HCL 4 MG PO TABS: 4.0000 mg | ORAL_TABLET | Freq: Four times a day (QID) | ORAL | Status: DC | PRN

## 2024-01-28 MED ORDER — ONDANSETRON HCL 4 MG/2ML IJ SOLN: 4.0000 mg | Freq: Four times a day (QID) | INTRAMUSCULAR | Status: DC | PRN

## 2024-01-28 MED ORDER — CARVEDILOL 12.5 MG PO TABS
12.5000 mg | ORAL_TABLET | Freq: Two times a day (BID) | ORAL | Status: DC
  Administered 2024-01-29 – 2024-02-02 (×9): 12.5 mg via ORAL
  Filled 2024-01-28 (×9): qty 1

## 2024-01-28 NOTE — H&P (Addendum)
 History and Physical    Patient: Maria Parrish FMW:[id number] DOB: [birth year] DOA: 01/28/2024 DOS: the patient was seen and examined on 01/28/2024 PCP: Margarete Maeola DASEN, FNP  Patient coming from: Home  Chief Complaint:  Chief Complaint  Patient presents with   Altered Mental Status   HPI: Maria Parrish is a 78 year old female with a history of contact impairment, CKD stage V, diabetes mellitus type 2, hypertension, hyperlipidemia, dCHF, and COPD presenting with altered mental status.  History is supplemented by the patient's son at the bedside.  The patient's son states that he was going to get the patient ready to go get blood work done when he found the patient on the floor next to her bedside commode and bed.  He stated that she was awake but confused.  The son states that the patient has not been feeling well since last Thursday, 01/24/2024.  On that day, the patient had a couple episodes of emesis and loose stools.  Since then, the patient has had decreased oral intake and generalized weakness.  There were no complaints of fevers, chills, chest pain, shortness breath, abdominal pain, headache. Because the patient has had decreased oral intake and vomiting, the patient's son made the decision not to give her her usual medications over the last couple days.  He states that he held the patient's medications starting 01/25/2024. At baseline, the patient is able to ambulate and make transfers with a walker.  She is conversant but is unaware of the date at baseline. Presently, the patient denies any headache, chest pain, shortness of breath, coughing, hemoptysis, vomiting, abdominal pain, diarrhea. In the ED, the patient was afebrile and hemodynamically stable with oxygen  saturation 98% on room air.  WBC 11.3, hemoglobin 8.5, platelets 396.  Sodium 142, potassium 2.3, bicarbonate 17, serum creatinine 5.97.  LFTs were unremarkable.  COVID-19 PCR was negative. CT of the brain was negative for acute  findings.  CT of the cervical spine was negative for any traumatic injuries.  CT of the CAP showed LLL and RML patchy opacities with bilateral trace pleural effusions.  There is no other acute intrathoracic or intra-abdominal traumatic injury.  There was old healed sacral insufficiency fractures and old left inferior pubic rami fractures.  There was some subcutaneous edema about the left hip.  CT of the L-spine showed age-indeterminate L1 fracture with 35% height loss.  CT of the thoracic spine was negative for any injury.  Patient was started on IV fluids, doxycycline , and ceftriaxone  in the emergency department.  For her elevated blood pressure, she was given amlodipine , carvedilol , hydralazine  50 mg, and Imdur .  Notably, the patient was was admitted to the hospital from 11/18/2023 to 11/21/2023 secondary to aDHF.  She was treated with IV furosemide  and discharged with torsemide  20 mg twice daily.  She was transfused 1 unit PRBC.  During that hospitalization she was seen by palliative medicine and was made DNR.  Review of Systems: As mentioned in the history of present illness. All other systems reviewed and are negative. Past Medical History:  Diagnosis Date   Assistance needed for mobility    walks with walker   Confusion    Dementia (HCC)    Diabetes mellitus without complication (HCC)    Generalized weakness    Heart failure with improved ejection fraction (HFimpEF) (HCC)    a. EF 40-45% by echo in 04/2020 b. normalized to 55-60% by echo in 06/2021   Hypercalcemia    Hyperlipidemia    Hyperparathyroidism (  HCC)    Hypertension    Hypertension    Moderate protein-calorie malnutrition (HCC)    Peripheral neuropathy    UTI (lower urinary tract infection)    Past Surgical History:  Procedure Laterality Date   COLONOSCOPY N/A 08/12/2015   Procedure: COLONOSCOPY;  Surgeon: Lamar CHRISTELLA Hollingshead, MD;  Location: [facility];  Service: Endoscopy;  Laterality: N/A;  1245pm   ESOPHAGOGASTRODUODENOSCOPY  N/A 08/12/2015   Procedure: ESOPHAGOGASTRODUODENOSCOPY (EGD);  Surgeon: Lamar CHRISTELLA Hollingshead, MD;  Location: [facility];  Service: Endoscopy;  Laterality: N/A;   none     Social History:  reports that she has never smoked. She has never used smokeless tobacco. She reports that she does not drink alcohol and does not use drugs.  Allergies  Allergen Reactions   Statins Other (See Comments)    Makes wrists and fingers ache Muscle Pain and Joint Pain   Penicillins Other (See Comments)    Makes patient sore.    Family History  Problem Relation Age of Onset   Heart attack Mother    Diabetes Father    Hypertension Brother    Hypertension Brother    Diabetes Brother    Colon cancer Neg Hx     Prior to Admission medications   Medication Sig Start Date End Date Taking? Authorizing Provider  albuterol  (VENTOLIN  HFA) 108 (90 Base) MCG/ACT inhaler Inhale 2 puffs into the lungs every 6 (six) hours as needed for wheezing or shortness of breath. 09/30/23  Yes Ricky Fines, MD  amLODipine  (NORVASC ) 10 MG tablet Take 1 tablet by mouth once daily 01/01/23  Yes Branch, Dorn FALCON, MD  carvedilol  (COREG ) 12.5 MG tablet Take 1 tablet by mouth twice daily Patient taking differently: Take 12.5 mg by mouth daily. 04/11/23  Yes BranchDorn FALCON, MD  Cyanocobalamin  (VITAMIN B-12 PO) Take 1 tablet by mouth daily.   Yes [provider]  ezetimibe  (ZETIA ) 10 MG tablet Take 1 tablet (10 mg total) by mouth daily. 05/11/23  Yes Strader, Laymon CHRISTELLA, PA-C  ferrous sulfate  325 (65 FE) MG tablet Take 325 mg by mouth daily with breakfast.   Yes [provider]  Accu-Chek Softclix Lancets lancets Use to check blood glucose twice daily as instructed 09/21/23   Nida, Gebreselassie W, MD  Alcohol Swabs (DROPSAFE ALCOHOL PREP) 70 % PADS USE TWICE DAILY AS INSTRUCTED 12/03/23   Nida, Gebreselassie W, MD  B-D ULTRAFINE III SHORT PEN 31G X 8 MM MISC ISE 1 PEN NEEDLE ONCE DAILY AS DIRECTED 06/11/23   Nida,  Gebreselassie W, MD  Blood Glucose Monitoring Suppl (ACCU-CHEK GUIDE ME) w/Device KIT Use to check blood glucose twice daily as directed. 09/21/23   Nida, Gebreselassie W, MD  epoetin  alfa-epbx (RETACRIT ) 4000 UNIT/ML injection Inject 4,000 Units into the skin 2 (two) times a week. Friday    [provider]  glucose blood (ACCU-CHEK GUIDE TEST) test strip Use to check blood glucose twice daily as directed. 09/21/23   Nida, Gebreselassie W, MD  hydrALAZINE  (APRESOLINE ) 100 MG tablet Take 0.5 tablets (50 mg total) by mouth 3 (three) times daily. 11/21/23   Johnson, Clanford L, MD  isosorbide  mononitrate (IMDUR ) 30 MG 24 hr tablet Take 1 tablet (30 mg total) by mouth daily. 12/29/20   Nida, Gebreselassie W, MD  pantoprazole  (PROTONIX ) 40 MG tablet Take 1 tablet (40 mg total) by mouth daily. 09/30/23 09/29/24  Ricky Fines, MD  sodium bicarbonate  650 MG tablet Take 1 tablet (650 mg total) by mouth  2 (two) times daily. 09/30/23   Ricky Fines, MD  torsemide  (DEMADEX ) 20 MG tablet Take 1 tablet (20 mg total) by mouth 2 (two) times daily. 11/21/23   Johnson, Clanford L, MD  TRESIBA  FLEXTOUCH 100 UNIT/ML FlexTouch Pen Inject 28 Units into the skin at bedtime. 01/02/24   Lenis Ethelle ORN, MD    Physical Exam: Vitals:   01/28/24 1303 01/28/24 1400 01/28/24 1415 01/28/24 1430  BP: (!) 226/114 (!) 224/92 (!) 219/101 (!) 183/98  Pulse: 82 70 82 80  Resp: (!) 22 (!) 25 (!) 24 (!) 25  Temp:      TempSrc:      SpO2: 100% 98% 98% 98%   GENERAL:  A&O x 2, NAD, well developed, cooperative, follows commands HEENT: [place]/AT, No thrush, No icterus, No oral ulcers Neck:  No neck mass, No meningismus, soft, supple CV: RRR, no S3, no S4, no rub, no JVD Lungs: bibasilar rales. No wheeze Abd: soft/NT +BS, nondistended Ext: 1 + LE edema, no lymphangitis, no cyanosis, no rashes Neuro:  CN II-XII intact, strength 4-/5 in RUE, RLE, strength 4-/5 LUE, LLE; sensation intact bilateral; no dysmetria; babinski  equivocal  Data Reviewed: {Labs and radiographs reviewed in history above  Assessment and Plan: Acute on chronic renal failure--CKD 5 - Baseline creatinine 4.1-4.4 - Presented with serum creatinine 5.97 - Secondary to volume depletion - Start IV fluids  Lobar pneumonia/aspiration pneumonia - Start cefepime  and continue doxycycline  - MRSA screen - Follow-up blood cultures  Acute metabolic encephalopathy - Secondary to acute on chronic renal failure and infectious process -Patient is mildly somnolent on examination, but awakens on examination - Further workup if no improvement - CT brain negative for acute findings - UA/culture  Chronic HFpEF - Clinically dry on exam - 09/29/2023 echo EF 55 to 60%, no WMA, grade 1 DD, normal RVF, mild MR - Holding torsemide  temporarily  Essential hypertension/hypertensive urgency - Restart amlodipine , carvedilol , hydralazine , Imdur   Metabolic acidosis - Restart bicarbonate  Diabetes mellitus type 2 with hyperglycemia - NovoLog  sliding scale - 09/29/2023 hemoglobin A1c 6.9 - Holding Tresiba  temporarily and monitor CBGs  Anemia of CKD - Baseline hemoglobin 8  COPD - Continue bronchodilators - No wheeze on examination  L1 compression fracture - No signs of neurologic compromise on exam - PT evaluation  Hypokalemia -replete -check mag     Advance Care Planning: FULL  Consults: none  Family Communication: son 7/14  Severity of Illness: The appropriate patient status for this patient is INPATIENT. Inpatient status is judged to be reasonable and necessary in order to provide the required intensity of service to ensure the patient's safety. The patient's presenting symptoms, physical exam findings, and initial radiographic and laboratory data in the context of their chronic comorbidities is felt to place them at high risk for further clinical deterioration. Furthermore, it is not anticipated that the patient will be medically  stable for discharge from the hospital within 2 midnights of admission.   * I certify that at the point of admission it is my clinical judgment that the patient will require inpatient hospital care spanning beyond 2 midnights from the point of admission due to high intensity of service, high risk for further deterioration and high frequency of surveillance required.*  Author: Alm Schneider, MD 01/28/2024 3:44 PM  For on call review [email or web address].

## 2024-01-28 NOTE — Hospital Course (Addendum)
 78 year old female with a history of contact impairment, CKD stage V, diabetes mellitus type 2, hypertension, hyperlipidemia, dCHF, and COPD presenting with altered mental status.  History is supplemented by the patient's son at the bedside.  The patient's son states that he was going to get the patient ready to go get blood work done when he found the patient on the floor next to her bedside commode and bed.  He stated that she was awake but confused.  The son states that the patient has not been feeling well since last Thursday, 01/24/2024.  On that day, the patient had a couple episodes of emesis and loose stools.  Since then, the patient has had decreased oral intake and generalized weakness.  There were no complaints of fevers, chills, chest pain, shortness breath, abdominal pain, headache. Because the patient has had decreased oral intake and vomiting, the patient's son made the decision not to give her her usual medications over the last couple days.  He states that he held the patient's medications starting 01/25/2024. At baseline, the patient is able to ambulate and make transfers with a walker.  She is conversant but is unaware of the date at baseline. Presently, the patient denies any headache, chest pain, shortness of breath, coughing, hemoptysis, vomiting, abdominal pain, diarrhea. In the ED, the patient was afebrile and hemodynamically stable with oxygen  saturation 98% on room air.  WBC 11.3, hemoglobin 8.5, platelets 396.  Sodium 142, potassium 2.3, bicarbonate 17, serum creatinine 5.97.  LFTs were unremarkable.  COVID-19 PCR was negative. CT of the brain was negative for acute findings.  CT of the cervical spine was negative for any traumatic injuries.  CT of the CAP showed LLL and RML patchy opacities with bilateral trace pleural effusions.  There is no other acute intrathoracic or intra-abdominal traumatic injury.  There was old healed sacral insufficiency fractures and old left inferior pubic  rami fractures.  There was some subcutaneous edema about the left hip.  CT of the L-spine showed age-indeterminate L1 fracture with 35% height loss.  CT of the thoracic spine was negative for any injury.  Patient was started on IV fluids, doxycycline , and ceftriaxone  in the emergency department.  For her elevated blood pressure, she was given amlodipine , carvedilol , hydralazine  50 mg, and Imdur .  Notably, the patient was was admitted to the hospital from 11/18/2023 to 11/21/2023 secondary to aDHF.  She was treated with IV furosemide  and discharged with torsemide  20 mg twice daily.  She was transfused 1 unit PRBC.  During that hospitalization she was seen by palliative medicine and was made DNR.

## 2024-01-28 NOTE — ED Provider Notes (Signed)
 [place] EMERGENCY DEPARTMENT AT [facility] Provider Note   CSN: [id number] Arrival date & time: 01/28/24  1204     Patient presents with: Altered Mental Status   Maria Parrish is a 78 y.o. female.   HPI Patient presents for altered mental status.  Medical history includes DM, dementia, HTN, HLD, CHF, anemia.  She lives at home with her son.  She had a unwitnessed fall today while transferring from bed to bedside commode.  Son noticed that she was altered the on baseline.  EMS was called.  EMS noted elevated blood pressures in the range of 200s SBP.  Patient has reported been off of her home medications for the past 4 days.  Reason for this is unknown.  Patient initially endorsed some mid back pain.  On arrival, she denies any areas of discomfort.    Prior to Admission medications   Medication Sig Start Date End Date Taking? Authorizing Provider  albuterol  (VENTOLIN  HFA) 108 (90 Base) MCG/ACT inhaler Inhale 2 puffs into the lungs every 6 (six) hours as needed for wheezing or shortness of breath. 09/30/23  Yes Ricky Fines, MD  amLODipine  (NORVASC ) 10 MG tablet Take 1 tablet by mouth once daily 01/01/23  Yes Branch, Dorn FALCON, MD  carvedilol  (COREG ) 12.5 MG tablet Take 1 tablet by mouth twice daily Patient taking differently: Take 12.5 mg by mouth daily. 04/11/23  Yes BranchDorn FALCON, MD  Cyanocobalamin  (VITAMIN B-12 PO) Take 1 tablet by mouth daily.   Yes [provider]  ezetimibe  (ZETIA ) 10 MG tablet Take 1 tablet (10 mg total) by mouth daily. 05/11/23  Yes Strader, Laymon CHRISTELLA, PA-C  ferrous sulfate  325 (65 FE) MG tablet Take 325 mg by mouth daily with breakfast.   Yes [provider]  Accu-Chek Softclix Lancets lancets Use to check blood glucose twice daily as instructed 09/21/23   Nida, Gebreselassie W, MD  Alcohol Swabs (DROPSAFE ALCOHOL PREP) 70 % PADS USE TWICE DAILY AS INSTRUCTED 12/03/23   Nida, Gebreselassie W, MD  B-D ULTRAFINE III SHORT PEN 31G X  8 MM MISC ISE 1 PEN NEEDLE ONCE DAILY AS DIRECTED 06/11/23   Nida, Gebreselassie W, MD  Blood Glucose Monitoring Suppl (ACCU-CHEK GUIDE ME) w/Device KIT Use to check blood glucose twice daily as directed. 09/21/23   Nida, Gebreselassie W, MD  epoetin  alfa-epbx (RETACRIT ) 4000 UNIT/ML injection Inject 4,000 Units into the skin 2 (two) times a week. Friday    [provider]  glucose blood (ACCU-CHEK GUIDE TEST) test strip Use to check blood glucose twice daily as directed. 09/21/23   Nida, Gebreselassie W, MD  hydrALAZINE  (APRESOLINE ) 100 MG tablet Take 0.5 tablets (50 mg total) by mouth 3 (three) times daily. 11/21/23   Johnson, Clanford L, MD  isosorbide  mononitrate (IMDUR ) 30 MG 24 hr tablet Take 1 tablet (30 mg total) by mouth daily. 12/29/20   Nida, Gebreselassie W, MD  pantoprazole  (PROTONIX ) 40 MG tablet Take 1 tablet (40 mg total) by mouth daily. 09/30/23 09/29/24  Ricky Fines, MD  sodium bicarbonate  650 MG tablet Take 1 tablet (650 mg total) by mouth 2 (two) times daily. 09/30/23   Ricky Fines, MD  torsemide  (DEMADEX ) 20 MG tablet Take 1 tablet (20 mg total) by mouth 2 (two) times daily. 11/21/23   Johnson, Clanford L, MD  TRESIBA  FLEXTOUCH 100 UNIT/ML FlexTouch Pen Inject 28 Units into the skin at bedtime. 01/02/24   Nida, Gebreselassie W, MD    Allergies: Statins and Penicillins  Review of Systems  Unable to perform ROS: Dementia    Updated Vital Signs BP (!) 219/101   Pulse 82   Temp 97.7 F (36.5 C) (Rectal)   Resp (!) 24   SpO2 98%   Physical Exam Vitals and nursing note reviewed.  Constitutional:      General: She is not in acute distress.    Appearance: Normal appearance. She is well-developed. She is not ill-appearing, toxic-appearing or diaphoretic.  HENT:     Head: Normocephalic and atraumatic.     Right Ear: External ear normal.     Left Ear: External ear normal.     Nose: Nose normal.     Mouth/Throat:     Mouth: Mucous membranes are moist.  Eyes:      Extraocular Movements: Extraocular movements intact.     Conjunctiva/sclera: Conjunctivae normal.  Neck:     Comments: Cervical collar in place Cardiovascular:     Rate and Rhythm: Normal rate and regular rhythm.  Pulmonary:     Effort: Pulmonary effort is normal. No respiratory distress.     Breath sounds: Normal breath sounds.  Chest:     Chest wall: No tenderness.  Abdominal:     General: There is no distension.     Palpations: Abdomen is soft.     Tenderness: There is no abdominal tenderness.  Musculoskeletal:        General: No swelling or deformity.     Cervical back: Neck supple.  Skin:    General: Skin is warm and dry.     Coloration: Skin is not jaundiced or pale.  Neurological:     General: No focal deficit present.     Mental Status: She is alert. She is disoriented.  Psychiatric:        Mood and Affect: Mood normal.        Behavior: Behavior normal.     (all labs ordered are listed, but only abnormal results are displayed) Labs Reviewed  COMPREHENSIVE METABOLIC PANEL WITH GFR - Abnormal; Notable for the following components:      Result Value   Potassium 2.3 (*)    CO2 17 (*)    Glucose, Bld 168 (*)    BUN 52 (*)    Creatinine, Ser 5.97 (*)    Albumin 3.1 (*)    GFR, Estimated 7 (*)    Anion gap 19 (*)    All other components within normal limits  CBC WITH DIFFERENTIAL/PLATELET - Abnormal; Notable for the following components:   WBC 11.3 (*)    RBC 2.80 (*)    Hemoglobin 8.5 (*)    HCT 23.9 (*)    Neutro Abs 9.9 (*)    All other components within normal limits  PROTIME-INR - Abnormal; Notable for the following components:   Prothrombin Time 15.4 (*)    All other components within normal limits  TROPONIN I (HIGH SENSITIVITY) - Abnormal; Notable for the following components:   Troponin I (High Sensitivity) 45 (*)    All other components within normal limits  RESP PANEL BY RT-PCR (RSV, FLU A&B, COVID)  RVPGX2  CULTURE, BLOOD (ROUTINE X 2)  CULTURE,  BLOOD (ROUTINE X 2)  LACTIC ACID, PLASMA  MAGNESIUM   LACTIC ACID, PLASMA  URINALYSIS, W/ REFLEX TO CULTURE (INFECTION SUSPECTED)  AMMONIA  I-STAT CHEM 8, ED  TROPONIN I (HIGH SENSITIVITY)    EKG: None  Radiology: CT CHEST ABDOMEN PELVIS WO CONTRAST Result Date: 01/28/2024 CLINICAL DATA:  Polytrauma, blunt Table formatting  from the original note was not included. Pt bib rcems after call from family that pt was found altered and down by her toilet. Family reported pt has not been taking her medications. Pt smells of UTI. EXAM: CT CHEST, ABDOMEN AND PELVIS WITHOUT CONTRAST TECHNIQUE: Multidetector CT imaging of the chest, abdomen and pelvis was performed following the standard protocol without IV contrast. RADIATION DOSE REDUCTION: This exam was performed according to the departmental dose-optimization program which includes automated exposure control, adjustment of the mA and/or kV according to patient size and/or use of iterative reconstruction technique. COMPARISON:  None Available. FINDINGS: CHEST: Cardiovascular: The thoracic aorta is normal in caliber. The heart is enlarged in size. No significant pericardial effusion. Cardiac findings suggestive of anemia. Severe atherosclerotic plaque. Four-vessel coronary artery calcification. Lungs/Pleura: Slight mosaic attenuation of the bilateral upper lobes. Trace biapical interlobular septal wall thickening. Left lower lobe and right middle lobe patchy airspace opacity. No pulmonary nodule. No pulmonary mass. No pulmonary contusion or laceration. No pneumatocele formation. Bilateral trace to small volume pleural effusions. No pneumothorax. No hemothorax. Mediastinum/Nodes: No pneumomediastinum. The central airways are patent. The esophagus is unremarkable. The thyroid  is unremarkable. Limited evaluation for hilar lymphadenopathy on this noncontrast study. No mediastinal or axillary lymphadenopathy. Musculoskeletal/Chest wall No chest wall mass. No acute rib  or sternal fracture. No spinal fracture. ABDOMEN / PELVIS: Hepatobiliary: Not enlarged. No focal lesion. The gallbladder is otherwise unremarkable with no radio-opaque gallstones. No biliary ductal dilatation. Pancreas: Normal pancreatic contour. No main pancreatic duct dilatation. Spleen: Not enlarged. No focal lesion. Adrenals/Urinary Tract: Left adrenal gland hyperplasia.  No nodularity bilaterally. No hydroureteronephrosis. No ureterolithiasis. Indications associated with the kidneys are likely vascular. Possible tiny nonobstructive nephrolithiasis noted bilaterally. No contour deforming renal mass. The urinary bladder is grossly unremarkable unremarkable. Stomach/Bowel: No small or large bowel wall thickening or dilatation. Colonic diverticulosis. The appendix is unremarkable. Vasculature/Lymphatic: Severe atherosclerotic plaque. No abdominal aorta or iliac aneurysm. No abdominal, pelvic, inguinal lymphadenopathy. Reproductive: Lobulated uterus with multiple calcified lesions. Other: No simple free fluid ascites. No pneumoperitoneum. No mesenteric hematoma identified. No organized fluid collection. Musculoskeletal: Left hip subcutaneus soft tissue edema with possible developing hematoma. No acute pelvic fracture. Severe degenerative changes of the bilateral hips. Old healed sacral insufficiency fractures. Old healed left inferior pubic rami fracture. Please see separately dictated CT thoracolumbar spine. Other ports and devices: None. IMPRESSION: 1. Left lower lobe and right middle lobe patchy airspace opacity. Findings suggestive of multifocal pneumonia. 2. Bilateral trace to small volume pleural effusions. 3. No acute intrathoracic, intra-abdominal, intrapelvic traumatic injury with limited evaluation on this noncontrast study. 4. Please see separately dictated CT thoracolumbar spine. 5. Other imaging findings of potential clinical significance: Cardiomegaly. Colonic diverticulosis with no acute  diverticulitis. Degenerative uterine fibroids. Aortic Atherosclerosis (ICD10-I70.0) including four-vessel coronary artery calcification. Electronically Signed   By: Morgane  Naveau M.D.   On: 01/28/2024 14:02   CT T-SPINE NO CHARGE Result Date: 01/28/2024 CLINICAL DATA:  found altered and down by her toilet. Family reported pt has not been taking her medications. Pt smells of UTI. EXAM: CT THORACIC AND LUMBAR SPINE WITHOUT CONTRAST TECHNIQUE: Multidetector CT imaging of the thoracic and lumbar spine was performed without contrast. Multiplanar CT image reconstructions were also generated. RADIATION DOSE REDUCTION: This exam was performed according to the departmental dose-optimization program which includes automated exposure control, adjustment of the mA and/or kV according to patient size and/or use of iterative reconstruction technique. COMPARISON:  Chest x-ray 05/01/2017 FINDINGS: CT THORACIC SPINE  FINDINGS Alignment: Normal. Vertebrae: Chronic vague superior endplate concavity at the T2, T3, T4 levels. No acute fracture or focal pathologic process. Paraspinal and other soft tissues: Negative. Disc levels: Maintained. CT LUMBAR SPINE FINDINGS Segmentation: 5 lumbar type vertebrae. Alignment: Normal. Vertebrae: Multilevel moderate severe degenerative changes spine most prominent at the L4-L5 and L5-S1 levels. Age-indeterminate L1 superior endplate compression fracture with prominent Schmorl node. At least 35% vertebral body height loss. No focal pathologic process. Paraspinal and other soft tissues: Negative. Disc levels: Multilevel intervertebral disc space vacuum phenomenon. Other: Please see separately dictated CT chest abdomen pelvis 01/28/2024. IMPRESSION: CT THORACIC SPINE IMPRESSION 1. No acute displaced fracture or traumatic listhesis of the thoracic spine. CT LUMBAR SPINE IMPRESSION 1. Age-indeterminate L1 superior endplate compression fracture with prominent Schmorl node. At least 35% vertebral body  height loss. Correlate with point tenderness to palpation to evaluate for an acute component. 1. Other: Please see separately dictated CT chest abdomen pelvis 01/28/2024. Electronically Signed   By: Morgane  Naveau M.D.   On: 01/28/2024 13:54   CT L-SPINE NO CHARGE Result Date: 01/28/2024 CLINICAL DATA:  found altered and down by her toilet. Family reported pt has not been taking her medications. Pt smells of UTI. EXAM: CT THORACIC AND LUMBAR SPINE WITHOUT CONTRAST TECHNIQUE: Multidetector CT imaging of the thoracic and lumbar spine was performed without contrast. Multiplanar CT image reconstructions were also generated. RADIATION DOSE REDUCTION: This exam was performed according to the departmental dose-optimization program which includes automated exposure control, adjustment of the mA and/or kV according to patient size and/or use of iterative reconstruction technique. COMPARISON:  Chest x-ray 05/01/2017 FINDINGS: CT THORACIC SPINE FINDINGS Alignment: Normal. Vertebrae: Chronic vague superior endplate concavity at the T2, T3, T4 levels. No acute fracture or focal pathologic process. Paraspinal and other soft tissues: Negative. Disc levels: Maintained. CT LUMBAR SPINE FINDINGS Segmentation: 5 lumbar type vertebrae. Alignment: Normal. Vertebrae: Multilevel moderate severe degenerative changes spine most prominent at the L4-L5 and L5-S1 levels. Age-indeterminate L1 superior endplate compression fracture with prominent Schmorl node. At least 35% vertebral body height loss. No focal pathologic process. Paraspinal and other soft tissues: Negative. Disc levels: Multilevel intervertebral disc space vacuum phenomenon. Other: Please see separately dictated CT chest abdomen pelvis 01/28/2024. IMPRESSION: CT THORACIC SPINE IMPRESSION 1. No acute displaced fracture or traumatic listhesis of the thoracic spine. CT LUMBAR SPINE IMPRESSION 1. Age-indeterminate L1 superior endplate compression fracture with prominent Schmorl  node. At least 35% vertebral body height loss. Correlate with point tenderness to palpation to evaluate for an acute component. 1. Other: Please see separately dictated CT chest abdomen pelvis 01/28/2024. Electronically Signed   By: Morgane  Naveau M.D.   On: 01/28/2024 13:54   DG Chest Port 1 View Result Date: 01/28/2024 CLINICAL DATA:  Questionable sepsis - evaluate for abnormality EXAM: PORTABLE CHEST 1 VIEW COMPARISON:  CT chest 01/28/2024, chest x-ray 11/18/2023 FINDINGS: The heart and mediastinal contours are unchanged. Atherosclerotic plaque. Left lower lobe and right middle lobe patchy airspace opacities. No pulmonary edema. Bilateral trace to small volume pleural effusions. No pneumothorax. No acute osseous abnormality. IMPRESSION: 1. Left lower lobe and right middle lobe patchy airspace opacities. Findings suggestive of multifocal pneumonia. Followup PA and lateral chest X-ray is recommended in 3-4 weeks following therapy to ensure resolution. 2. Bilateral trace to small volume pleural effusions. Electronically Signed   By: Morgane  Naveau M.D.   On: 01/28/2024 13:46   CT Head Wo Contrast Result Date: 01/28/2024 CLINICAL DATA:  Head trauma, moderate-severe;  Neck trauma (Age >= 65y) call from family that pt was found altered and down by her toilet. Family reported pt has not been taking her medications. Pt smells of UTI. EXAM: CT HEAD WITHOUT CONTRAST CT CERVICAL SPINE WITHOUT CONTRAST TECHNIQUE: Multidetector CT imaging of the head and cervical spine was performed following the standard protocol without intravenous contrast. Multiplanar CT image reconstructions of the cervical spine were also generated. RADIATION DOSE REDUCTION: This exam was performed according to the departmental dose-optimization program which includes automated exposure control, adjustment of the mA and/or kV according to patient size and/or use of iterative reconstruction technique. COMPARISON:  CT head 05/01/2017. FINDINGS: CT  HEAD FINDINGS Brain: Cerebral ventricle sizes are concordant with the degree of cerebral volume loss. Patchy and confluent areas of decreased attenuation are noted throughout the deep and periventricular Parrish matter of the cerebral hemispheres bilaterally, compatible with chronic microvascular ischemic disease. No evidence of large-territorial acute infarction. No parenchymal hemorrhage. No mass lesion. No extra-axial collection. No mass effect or midline shift. No hydrocephalus. Basilar cisterns are patent. Vascular: No hyperdense vessel. Atherosclerotic calcifications are present within the cavernous internal carotid and vertebral arteries. Skull: No acute fracture or focal lesion. Sinuses/Orbits: Paranasal sinuses and mastoid air cells are clear. The orbits are unremarkable. Other: Left likely subacute 6 mm scalp hematoma. Midline likely subacute 5 mm scalp hematoma. CT CERVICAL SPINE FINDINGS Alignment: Reversal of normal cervical lordosis centered at the C5 level likely due to positioning and degenerative changes. Grade 1 anterolisthesis of C3 on C4 and C4 on C5. Skull base and vertebrae: Multilevel mild-to-moderate degenerative change of the spine most prominent at the C5-C6 levels. No associated severe osseous neural foraminal or central canal stenosis. No acute fracture. No aggressive appearing focal osseous lesion or focal pathologic process. Soft tissues and spinal canal: No prevertebral fluid or swelling. No visible canal hematoma. Upper chest: Unremarkable. Other: Atherosclerotic plaque of the carotid arteries within the neck. IMPRESSION: 1. No acute intracranial abnormality. 2. No acute displaced fracture or traumatic listhesis of the cervical spine. Electronically Signed   By: Morgane  Naveau M.D.   On: 01/28/2024 13:44   CT Cervical Spine Wo Contrast Result Date: 01/28/2024 CLINICAL DATA:  Head trauma, moderate-severe; Neck trauma (Age >= 65y) call from family that pt was found altered and down by  her toilet. Family reported pt has not been taking her medications. Pt smells of UTI. EXAM: CT HEAD WITHOUT CONTRAST CT CERVICAL SPINE WITHOUT CONTRAST TECHNIQUE: Multidetector CT imaging of the head and cervical spine was performed following the standard protocol without intravenous contrast. Multiplanar CT image reconstructions of the cervical spine were also generated. RADIATION DOSE REDUCTION: This exam was performed according to the departmental dose-optimization program which includes automated exposure control, adjustment of the mA and/or kV according to patient size and/or use of iterative reconstruction technique. COMPARISON:  CT head 05/01/2017. FINDINGS: CT HEAD FINDINGS Brain: Cerebral ventricle sizes are concordant with the degree of cerebral volume loss. Patchy and confluent areas of decreased attenuation are noted throughout the deep and periventricular Parrish matter of the cerebral hemispheres bilaterally, compatible with chronic microvascular ischemic disease. No evidence of large-territorial acute infarction. No parenchymal hemorrhage. No mass lesion. No extra-axial collection. No mass effect or midline shift. No hydrocephalus. Basilar cisterns are patent. Vascular: No hyperdense vessel. Atherosclerotic calcifications are present within the cavernous internal carotid and vertebral arteries. Skull: No acute fracture or focal lesion. Sinuses/Orbits: Paranasal sinuses and mastoid air cells are clear. The orbits are unremarkable. Other:  Left likely subacute 6 mm scalp hematoma. Midline likely subacute 5 mm scalp hematoma. CT CERVICAL SPINE FINDINGS Alignment: Reversal of normal cervical lordosis centered at the C5 level likely due to positioning and degenerative changes. Grade 1 anterolisthesis of C3 on C4 and C4 on C5. Skull base and vertebrae: Multilevel mild-to-moderate degenerative change of the spine most prominent at the C5-C6 levels. No associated severe osseous neural foraminal or central canal  stenosis. No acute fracture. No aggressive appearing focal osseous lesion or focal pathologic process. Soft tissues and spinal canal: No prevertebral fluid or swelling. No visible canal hematoma. Upper chest: Unremarkable. Other: Atherosclerotic plaque of the carotid arteries within the neck. IMPRESSION: 1. No acute intracranial abnormality. 2. No acute displaced fracture or traumatic listhesis of the cervical spine. Electronically Signed   By: Morgane  Naveau M.D.   On: 01/28/2024 13:44     Procedures   Medications Ordered in the ED  amLODipine  (NORVASC ) tablet 10 mg (10 mg Oral Given 01/28/24 1342)  carvedilol  (COREG ) tablet 12.5 mg (12.5 mg Oral Given 01/28/24 1342)  hydrALAZINE  (APRESOLINE ) tablet 50 mg (50 mg Oral Given 01/28/24 1342)  isosorbide  mononitrate (IMDUR ) 24 hr tablet 30 mg (30 mg Oral Given 01/28/24 1342)  cefTRIAXone  (ROCEPHIN ) 1 g in sodium chloride  0.9 % 100 mL IVPB (1 g Intravenous New Bag/Given 01/28/24 1423)  potassium chloride  10 mEq in 100 mL IVPB (10 mEq Intravenous New Bag/Given 01/28/24 1430)  0.9 %  sodium chloride  infusion ( Intravenous New Bag/Given 01/28/24 1426)  lactated ringers  bolus 1,000 mL (1,000 mLs Intravenous New Bag/Given 01/28/24 1243)  nitroGLYCERIN  (NITROGLYN) 2 % ointment 1 inch (1 inch Topical Given 01/28/24 1342)  doxycycline  (VIBRA -TABS) tablet 100 mg (100 mg Oral Given 01/28/24 1419)  potassium chloride  SA (KLOR-CON  M) CR tablet 40 mEq (40 mEq Oral Given 01/28/24 1419)                                    Medical Decision Making Amount and/or Complexity of Data Reviewed Labs: ordered. Radiology: ordered.  Risk Prescription drug management.   This patient presents to the ED for concern of generalized weakness and altered mental status, this involves an extensive number of treatment options, and is a complaint that carries with it a high risk of complications and morbidity.  The differential diagnosis includes infection, traumatic injuries, metabolic  derangements, polypharmacy, hypertensive encephalopathy, CVA, seizure   Co morbidities / Chronic conditions that complicate the patient evaluation  DM, dementia, HTN, HLD, CHF, anemia   Additional history obtained:  Additional history obtained from EMR External records from outside source obtained and reviewed including EMS   Lab Tests:  I Ordered, and personally interpreted labs.  The pertinent results include: AKI, hyperkalemia, anion gap metabolic acidosis, leukocytosis, mild elevation in troponin   Imaging Studies ordered:  I ordered imaging studies including chest x-ray, CT of head, cervical spine, chest, abdomen, pelvis, T-spine, L-spine I independently visualized and interpreted imaging which showed multifocal pneumonia, small bilateral pleural effusions, age-indeterminate L1 compression fracture I agree with the radiologist interpretation   Cardiac Monitoring: / EKG:  The patient was maintained on a cardiac monitor.  I personally viewed and interpreted the cardiac monitored which showed an underlying rhythm of: Sinus rhythm   Problem List / ED Course / Critical interventions / Medication management  Patient presents after a fall at home.  This was reportedly unwitnessed but patient was found on  the ground and son noted that she seemed altered.  On arrival, patient is disoriented.  She has no focal neurologic deficits.  Although she initially endorsing back pain with EMS, she denies any currently.  Workup was initiated.  I attempted to contact her son by telephone but call went unanswered.  Patient's initial lab work notable for leukocytosis, hypokalemia, creatinine elevated above baseline.  An anion gap is present, likely secondary to azotemia.  Troponin is mildly elevated but not consistent with ACS.  Chest x-ray shows evidence of multifocal pneumonia.  Antibiotics were ordered.  Repeat potassium was ordered in addition to additional IV fluids.  Patient had sustained  hypertension in the range of 220/100.  Home blood pressure medications were ordered.  On reassessment, patient is resting comfortably.  She was admitted for further management. I ordered medication including IV fluids for hydration, potassium chloride  for hypokalemia, home blood pressure medications and NTG ointment for hypertension, ceftriaxone  and doxycycline  for multifocal pneumonia Reevaluation of the patient after these medicines showed that the patient improved I have reviewed the patients home medicines and have made adjustments as needed  Social Determinants of Health:  Lives at home with son, has dementia at baseline  CRITICAL CARE Performed by: Bernardino Fireman   Total critical care time: 32 minutes  Critical care time was exclusive of separately billable procedures and treating other patients.  Critical care was necessary to treat or prevent imminent or life-threatening deterioration.  Critical care was time spent personally by me on the following activities: development of treatment plan with patient and/or surrogate as well as nursing, discussions with consultants, evaluation of patient's response to treatment, examination of patient, obtaining history from patient or surrogate, ordering and performing treatments and interventions, ordering and review of laboratory studies, ordering and review of radiographic studies, pulse oximetry and re-evaluation of patient's condition.      Final diagnoses:  Multifocal pneumonia  Generalized weakness  Hypokalemia  Fall, initial encounter  Hypertension, unspecified type    ED Discharge Orders     None          Fireman Bernardino, MD 01/28/24 1437

## 2024-01-28 NOTE — ED Notes (Signed)
 C-collar removed per verbal order from Dr. Melvenia

## 2024-01-28 NOTE — ED Notes (Signed)
 Ammonia delayed due to pt being in radiology department

## 2024-01-28 NOTE — ED Notes (Signed)
 ED TO INPATIENT HANDOFF REPORT  ED Nurse Name and Phone #: [phone]  S Name/Age/Gender Maria Parrish 78 y.o. female Room/Bed: APA15/APA15  Code Status   Code Status: Limited: Do not attempt resuscitation (DNR) -DNR-LIMITED -Do Not Intubate/DNI   Home/SNF/Other Home Patient oriented to: self, place, and situation Is this baseline? 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 Family reported pt has not been taking her medications. Pt smells of UTI.    Allergies Allergies  Allergen Reactions   Statins Other (See Comments)    Makes wrists and fingers ache Muscle Pain and Joint Pain   Penicillins Other (See Comments)    Makes patient sore.    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 EF 40-45% by echo in 04/2020 b. normalized to 55-60% by echo in 06/2021   Hypercalcemia    Hyperlipidemia    Hyperparathyroidism (HCC)    Hypertension     Hypertension    Moderate protein-calorie malnutrition (HCC)    Peripheral neuropathy    UTI (lower urinary tract infection)    Past Surgical History:  Procedure Laterality Date   COLONOSCOPY N/A 08/12/2015   Procedure: COLONOSCOPY;  Surgeon: Lamar CHRISTELLA Hollingshead, MD;  [facility];  Service: Endoscopy;  Laterality: N/A;  1245pm   ESOPHAGOGASTRODUODENOSCOPY N/A 08/12/2015   Procedure: ESOPHAGOGASTRODUODENOSCOPY (EGD);  Surgeon: Lamar CHRISTELLA Hollingshead, MD;  Location: [facility];  Service: Endoscopy;  Laterality: N/A;   none       A IV Location/Drains/Wounds Patient Lines/Drains/Airways Status     Active Line/Drains/Airways     Name Placement date Placement time Site Days   Peripheral IV 01/28/24 22 G Right Antecubital 01/28/24  1229  Antecubital  less than 1   Peripheral IV 01/28/24 20 G Left Antecubital 01/28/24  1225  Antecubital  less than 1            Intake/Output Last 24 hours  Intake/Output Summary (Last 24 hours) at 01/28/2024 1550 Last data filed at 01/28/2024 1504 Gross per 24 hour  Intake 1101.27 ml  Output --  Net 1101.27 ml    Labs/Imaging Results for orders placed or performed during the hospital encounter of 01/28/24 (from the past 48 hours)  Lactic acid, plasma     Status: None   Collection Time: 01/28/24 12:20 PM  Result Value Ref Range   Lactic Acid, Venous 1.3 0.5 - 1.9 mmol/L  Comment: Performed at [facility], [address], [place], [place] [address]  Comprehensive metabolic panel     Status: Abnormal   Collection Time: 01/28/24 12:27 PM  Result Value Ref Range   Sodium 142 135 - 145 mmol/L   Potassium 2.3 (LL) 3.5 - 5.1 mmol/L    Comment: CRITICAL RESULT CALLED TO, READ BACK BY AND VERIFIED WITH A Henri Guedes AT 1334 ON [id number] BY S DALTON   Chloride 106 98 - 111 mmol/L   CO2 17 (L) 22 - 32 mmol/L   Glucose, Bld 168 (H) 70 - 99 mg/dL    Comment: Glucose reference range applies only to samples taken after fasting for at least 8 hours.   BUN 52  (H) 8 - 23 mg/dL   Creatinine, Ser 4.02 (H) 0.44 - 1.00 mg/dL   Calcium  9.3 8.9 - 10.3 mg/dL   Total Protein 7.0 6.5 - 8.1 g/dL   Albumin 3.1 (L) 3.5 - 5.0 g/dL   AST 21 15 - 41 U/L   ALT 17 0 - 44 U/L   Alkaline Phosphatase 57 38 - 126 U/L   Total Bilirubin 0.9 0.0 - 1.2 mg/dL   GFR, Estimated 7 (L) >60 mL/min    Comment: (NOTE) Calculated using the CKD-EPI Creatinine Equation (2021)    Anion gap 19 (H) 5 - 15    Comment: Performed at [facility], [address], [place], [place] [address]  CBC with Differential     Status: Abnormal   Collection Time: 01/28/24 12:27 PM  Result Value Ref Range   WBC 11.3 (H) 4.0 - 10.5 K/uL   RBC 2.80 (L) 3.87 - 5.11 MIL/uL   Hemoglobin 8.5 (L) 12.0 - 15.0 g/dL   HCT 76.0 (L) 63.9 - 53.9 %   MCV 85.4 80.0 - 100.0 fL   MCH 30.4 26.0 - 34.0 pg   MCHC 35.6 30.0 - 36.0 g/dL   RDW 84.7 88.4 - 84.4 %   Platelets 396 150 - 400 K/uL   nRBC 0.0 0.0 - 0.2 %   Neutrophils Relative % 88 %   Neutro Abs 9.9 (H) 1.7 - 7.7 K/uL   Lymphocytes Relative 7 %   Lymphs Abs 0.8 0.7 - 4.0 K/uL   Monocytes Relative 4 %   Monocytes Absolute 0.5 0.1 - 1.0 K/uL   Eosinophils Relative 0 %   Eosinophils Absolute 0.0 0.0 - 0.5 K/uL   Basophils Relative 0 %   Basophils Absolute 0.0 0.0 - 0.1 K/uL   Immature Granulocytes 1 %   Abs Immature Granulocytes 0.06 0.00 - 0.07 K/uL    Comment: Performed at [facility], [address] [place], [place] [address]  Protime-INR     Status: Abnormal   Collection Time: 01/28/24 12:27 PM  Result Value Ref Range   Prothrombin Time 15.4 (H) 11.4 - 15.2 seconds   INR 1.2 0.8 - 1.2    Comment: (NOTE) INR goal varies based on device and disease states. Performed at [facility], [address], [place], [place] [address]   Troponin I (High Sensitivity)     Status: Abnormal   Collection Time: 01/28/24 12:27 PM  Result Value Ref Range   Troponin I (High Sensitivity) 45 (H) <18 ng/L    Comment: (NOTE) Elevated high sensitivity  troponin I (hsTnI) values and significant  changes across serial measurements may suggest ACS but many other  chronic and acute conditions are known to elevate hsTnI results.  Refer to the Links section for chest  pain algorithms and additional  guidance. Performed at [facility], [address], [place], [place] [address]   Magnesium      Status: None   Collection Time: 01/28/24 12:27 PM  Result Value Ref Range   Magnesium  2.0 1.7 - 2.4 mg/dL    Comment: Performed at [facility], [address], [place], [place] [address]  Resp panel by RT-PCR (RSV, Flu A&B, Covid) Anterior Nasal Swab     Status: None   Collection Time: 01/28/24 12:46 PM   Specimen: Anterior Nasal Swab  Result Value Ref Range   SARS Coronavirus 2 by RT PCR NEGATIVE NEGATIVE    Comment: (NOTE) SARS-CoV-2 target nucleic acids are NOT DETECTED.  The SARS-CoV-2 RNA is generally detectable in upper respiratory specimens during the acute phase of infection. The lowest concentration of SARS-CoV-2 viral copies this assay can detect is 138 copies/mL. A negative result does not preclude SARS-Cov-2 infection and should not be used as the sole basis for treatment or other patient management decisions. A negative result may occur with  improper specimen collection/handling, submission of specimen other than nasopharyngeal swab, presence of viral mutation(s) within the areas targeted by this assay, and inadequate number of viral copies(<138 copies/mL). A negative result must be combined with clinical observations, patient history, and epidemiological information. The expected result is Negative.  Fact Sheet for Patients:  BloggerCourse.com  Fact Sheet for Healthcare Providers:  SeriousBroker.it  This test is no t yet approved or cleared by the [place]  FDA and  has been authorized for detection and/or diagnosis of SARS-CoV-2 by FDA under an Emergency Use Authorization  (EUA). This EUA will remain  in effect (meaning this test can be used) for the duration of the COVID-19 declaration under Section 564(b)(1) of the Act, 21 U.S.C.section 360bbb-3(b)(1), unless the authorization is terminated  or revoked sooner.       Influenza A by PCR NEGATIVE NEGATIVE   Influenza B by PCR NEGATIVE NEGATIVE    Comment: (NOTE) The Xpert Xpress SARS-CoV-2/FLU/RSV plus assay is intended as an aid in the diagnosis of influenza from Nasopharyngeal swab specimens and should not be used as a sole basis for treatment. Nasal washings and aspirates are unacceptable for Xpert Xpress SARS-CoV-2/FLU/RSV testing.  Fact Sheet for Patients: BloggerCourse.com  Fact Sheet for Healthcare Providers: SeriousBroker.it  This test is not yet approved or cleared by the [place]  FDA and has been authorized for detection and/or diagnosis of SARS-CoV-2 by FDA under an Emergency Use Authorization (EUA). This EUA will remain in effect (meaning this test can be used) for the duration of the COVID-19 declaration under Section 564(b)(1) of the Act, 21 U.S.C. section 360bbb-3(b)(1), unless the authorization is terminated or revoked.     Resp Syncytial Virus by PCR NEGATIVE NEGATIVE    Comment: (NOTE) Fact Sheet for Patients: BloggerCourse.com  Fact Sheet for Healthcare Providers: SeriousBroker.it  This test is not yet approved or cleared by the [place]  FDA and has been authorized for detection and/or diagnosis of SARS-CoV-2 by FDA under an Emergency Use Authorization (EUA). This EUA will remain in effect (meaning this test can be used) for the duration of the COVID-19 declaration under Section 564(b)(1) of the Act, 21 U.S.C. section 360bbb-3(b)(1), unless the authorization is terminated or revoked.  Performed at [facility], [address] [place], [place] [address]   Lactic  acid, plasma     Status: None   Collection Time: 01/28/24  2:00 PM  Result Value Ref Range   Lactic  Acid, Venous 1.7 0.5 - 1.9 mmol/L    Comment: Performed at [facility], [address], [place], [place] [address]  Ammonia     Status: None   Collection Time: 01/28/24  2:00 PM  Result Value Ref Range   Ammonia 19 9 - 35 umol/L    Comment: Performed at [facility], [address] [place], [place] [address]  Troponin I (High Sensitivity)     Status: Abnormal   Collection Time: 01/28/24  2:00 PM  Result Value Ref Range   Troponin I (High Sensitivity) 44 (H) <18 ng/L    Comment: (NOTE) Elevated high sensitivity troponin I (hsTnI) values and significant  changes across serial measurements may suggest ACS but many other  chronic and acute conditions are known to elevate hsTnI results.  Refer to the Links section for chest pain algorithms and additional  guidance. Performed at [facility], [address], [place], [place] [address]    CT CHEST ABDOMEN PELVIS WO CONTRAST Result Date: 01/28/2024 CLINICAL DATA:  Polytrauma, blunt Table formatting from the original note was not included. Pt bib rcems after call from family that pt was found altered and down by her toilet. Family reported pt has not been taking her medications. Pt smells of UTI. EXAM: CT CHEST, ABDOMEN AND PELVIS WITHOUT CONTRAST TECHNIQUE: Multidetector CT imaging of the chest, abdomen and pelvis was performed following the standard protocol without IV contrast. RADIATION DOSE REDUCTION: This exam was performed according to the departmental dose-optimization program which includes automated exposure control, adjustment of the mA and/or kV according to patient size and/or use of iterative reconstruction technique. COMPARISON:  None Available. FINDINGS: CHEST: Cardiovascular: The thoracic aorta is normal in caliber. The heart is enlarged in size. No significant pericardial effusion. Cardiac findings suggestive of anemia. Severe  atherosclerotic plaque. Four-vessel coronary artery calcification. Lungs/Pleura: Slight mosaic attenuation of the bilateral upper lobes. Trace biapical interlobular septal wall thickening. Left lower lobe and right middle lobe patchy airspace opacity. No pulmonary nodule. No pulmonary mass. No pulmonary contusion or laceration. No pneumatocele formation. Bilateral trace to small volume pleural effusions. No pneumothorax. No hemothorax. Mediastinum/Nodes: No pneumomediastinum. The central airways are patent. The esophagus is unremarkable. The thyroid  is unremarkable. Limited evaluation for hilar lymphadenopathy on this noncontrast study. No mediastinal or axillary lymphadenopathy. Musculoskeletal/Chest wall No chest wall mass. No acute rib or sternal fracture. No spinal fracture. ABDOMEN / PELVIS: Hepatobiliary: Not enlarged. No focal lesion. The gallbladder is otherwise unremarkable with no radio-opaque gallstones. No biliary ductal dilatation. Pancreas: Normal pancreatic contour. No main pancreatic duct dilatation. Spleen: Not enlarged. No focal lesion. Adrenals/Urinary Tract: Left adrenal gland hyperplasia.  No nodularity bilaterally. No hydroureteronephrosis. No ureterolithiasis. Indications associated with the kidneys are likely vascular. Possible tiny nonobstructive nephrolithiasis noted bilaterally. No contour deforming renal mass. The urinary bladder is grossly unremarkable unremarkable. Stomach/Bowel: No small or large bowel wall thickening or dilatation. Colonic diverticulosis. The appendix is unremarkable. Vasculature/Lymphatic: Severe atherosclerotic plaque. No abdominal aorta or iliac aneurysm. No abdominal, pelvic, inguinal lymphadenopathy. Reproductive: Lobulated uterus with multiple calcified lesions. Other: No simple free fluid ascites. No pneumoperitoneum. No mesenteric hematoma identified. No organized fluid collection. Musculoskeletal: Left hip subcutaneus soft tissue edema with possible  developing hematoma. No acute pelvic fracture. Severe degenerative changes of the bilateral hips. Old healed sacral insufficiency fractures. Old healed left inferior pubic rami fracture. Please see separately dictated CT thoracolumbar spine. Other ports and devices: None. IMPRESSION: 1. Left lower lobe and right middle lobe patchy airspace opacity.  Findings suggestive of multifocal pneumonia. 2. Bilateral trace to small volume pleural effusions. 3. No acute intrathoracic, intra-abdominal, intrapelvic traumatic injury with limited evaluation on this noncontrast study. 4. Please see separately dictated CT thoracolumbar spine. 5. Other imaging findings of potential clinical significance: Cardiomegaly. Colonic diverticulosis with no acute diverticulitis. Degenerative uterine fibroids. Aortic Atherosclerosis (ICD10-I70.0) including four-vessel coronary artery calcification. Electronically Signed   By: Morgane  Naveau M.D.   On: 01/28/2024 14:02   CT T-SPINE NO CHARGE Result Date: 01/28/2024 CLINICAL DATA:  found altered and down by her toilet. Family reported pt has not been taking her medications. Pt smells of UTI. EXAM: CT THORACIC AND LUMBAR SPINE WITHOUT CONTRAST TECHNIQUE: Multidetector CT imaging of the thoracic and lumbar spine was performed without contrast. Multiplanar CT image reconstructions were also generated. RADIATION DOSE REDUCTION: This exam was performed according to the departmental dose-optimization program which includes automated exposure control, adjustment of the mA and/or kV according to patient size and/or use of iterative reconstruction technique. COMPARISON:  Chest x-ray 05/01/2017 FINDINGS: CT THORACIC SPINE FINDINGS Alignment: Normal. Vertebrae: Chronic vague superior endplate concavity at the T2, T3, T4 levels. No acute fracture or focal pathologic process. Paraspinal and other soft tissues: Negative. Disc levels: Maintained. CT LUMBAR SPINE FINDINGS Segmentation: 5 lumbar type  vertebrae. Alignment: Normal. Vertebrae: Multilevel moderate severe degenerative changes spine most prominent at the L4-L5 and L5-S1 levels. Age-indeterminate L1 superior endplate compression fracture with prominent Schmorl node. At least 35% vertebral body height loss. No focal pathologic process. Paraspinal and other soft tissues: Negative. Disc levels: Multilevel intervertebral disc space vacuum phenomenon. Other: Please see separately dictated CT chest abdomen pelvis 01/28/2024. IMPRESSION: CT THORACIC SPINE IMPRESSION 1. No acute displaced fracture or traumatic listhesis of the thoracic spine. CT LUMBAR SPINE IMPRESSION 1. Age-indeterminate L1 superior endplate compression fracture with prominent Schmorl node. At least 35% vertebral body height loss. Correlate with point tenderness to palpation to evaluate for an acute component. 1. Other: Please see separately dictated CT chest abdomen pelvis 01/28/2024. Electronically Signed   By: Morgane  Naveau M.D.   On: 01/28/2024 13:54   CT L-SPINE NO CHARGE Result Date: 01/28/2024 CLINICAL DATA:  found altered and down by her toilet. Family reported pt has not been taking her medications. Pt smells of UTI. EXAM: CT THORACIC AND LUMBAR SPINE WITHOUT CONTRAST TECHNIQUE: Multidetector CT imaging of the thoracic and lumbar spine was performed without contrast. Multiplanar CT image reconstructions were also generated. RADIATION DOSE REDUCTION: This exam was performed according to the departmental dose-optimization program which includes automated exposure control, adjustment of the mA and/or kV according to patient size and/or use of iterative reconstruction technique. COMPARISON:  Chest x-ray 05/01/2017 FINDINGS: CT THORACIC SPINE FINDINGS Alignment: Normal. Vertebrae: Chronic vague superior endplate concavity at the T2, T3, T4 levels. No acute fracture or focal pathologic process. Paraspinal and other soft tissues: Negative. Disc levels: Maintained. CT LUMBAR SPINE  FINDINGS Segmentation: 5 lumbar type vertebrae. Alignment: Normal. Vertebrae: Multilevel moderate severe degenerative changes spine most prominent at the L4-L5 and L5-S1 levels. Age-indeterminate L1 superior endplate compression fracture with prominent Schmorl node. At least 35% vertebral body height loss. No focal pathologic process. Paraspinal and other soft tissues: Negative. Disc levels: Multilevel intervertebral disc space vacuum phenomenon. Other: Please see separately dictated CT chest abdomen pelvis 01/28/2024. IMPRESSION: CT THORACIC SPINE IMPRESSION 1. No acute displaced fracture or traumatic listhesis of the thoracic spine. CT LUMBAR SPINE IMPRESSION 1. Age-indeterminate L1 superior endplate compression fracture with prominent Schmorl node. At least  35% vertebral body height loss. Correlate with point tenderness to palpation to evaluate for an acute component. 1. Other: Please see separately dictated CT chest abdomen pelvis 01/28/2024. Electronically Signed   By: Morgane  Naveau M.D.   On: 01/28/2024 13:54   DG Chest Port 1 View Result Date: 01/28/2024 CLINICAL DATA:  Questionable sepsis - evaluate for abnormality EXAM: PORTABLE CHEST 1 VIEW COMPARISON:  CT chest 01/28/2024, chest x-ray 11/18/2023 FINDINGS: The heart and mediastinal contours are unchanged. Atherosclerotic plaque. Left lower lobe and right middle lobe patchy airspace opacities. No pulmonary edema. Bilateral trace to small volume pleural effusions. No pneumothorax. No acute osseous abnormality. IMPRESSION: 1. Left lower lobe and right middle lobe patchy airspace opacities. Findings suggestive of multifocal pneumonia. Followup PA and lateral chest X-ray is recommended in 3-4 weeks following therapy to ensure resolution. 2. Bilateral trace to small volume pleural effusions. Electronically Signed   By: Morgane  Naveau M.D.   On: 01/28/2024 13:46   CT Head Wo Contrast Result Date: 01/28/2024 CLINICAL DATA:  Head trauma, moderate-severe;  Neck trauma (Age >= 65y) call from family that pt was found altered and down by her toilet. Family reported pt has not been taking her medications. Pt smells of UTI. EXAM: CT HEAD WITHOUT CONTRAST CT CERVICAL SPINE WITHOUT CONTRAST TECHNIQUE: Multidetector CT imaging of the head and cervical spine was performed following the standard protocol without intravenous contrast. Multiplanar CT image reconstructions of the cervical spine were also generated. RADIATION DOSE REDUCTION: This exam was performed according to the departmental dose-optimization program which includes automated exposure control, adjustment of the mA and/or kV according to patient size and/or use of iterative reconstruction technique. COMPARISON:  CT head 05/01/2017. FINDINGS: CT HEAD FINDINGS Brain: Cerebral ventricle sizes are concordant with the degree of cerebral volume loss. Patchy and confluent areas of decreased attenuation are noted throughout the deep and periventricular white matter of the cerebral hemispheres bilaterally, compatible with chronic microvascular ischemic disease. No evidence of large-territorial acute infarction. No parenchymal hemorrhage. No mass lesion. No extra-axial collection. No mass effect or midline shift. No hydrocephalus. Basilar cisterns are patent. Vascular: No hyperdense vessel. Atherosclerotic calcifications are present within the cavernous internal carotid and vertebral arteries. Skull: No acute fracture or focal lesion. Sinuses/Orbits: Paranasal sinuses and mastoid air cells are clear. The orbits are unremarkable. Other: Left likely subacute 6 mm scalp hematoma. Midline likely subacute 5 mm scalp hematoma. CT CERVICAL SPINE FINDINGS Alignment: Reversal of normal cervical lordosis centered at the C5 level likely due to positioning and degenerative changes. Grade 1 anterolisthesis of C3 on C4 and C4 on C5. Skull base and vertebrae: Multilevel mild-to-moderate degenerative change of the spine most prominent at  the C5-C6 levels. No associated severe osseous neural foraminal or central canal stenosis. No acute fracture. No aggressive appearing focal osseous lesion or focal pathologic process. Soft tissues and spinal canal: No prevertebral fluid or swelling. No visible canal hematoma. Upper chest: Unremarkable. Other: Atherosclerotic plaque of the carotid arteries within the neck. IMPRESSION: 1. No acute intracranial abnormality. 2. No acute displaced fracture or traumatic listhesis of the cervical spine. Electronically Signed   By: Morgane  Naveau M.D.   On: 01/28/2024 13:44   CT Cervical Spine Wo Contrast Result Date: 01/28/2024 CLINICAL DATA:  Head trauma, moderate-severe; Neck trauma (Age >= 65y) call from family that pt was found altered and down by her toilet. Family reported pt has not been taking her medications. Pt smells of UTI. EXAM: CT HEAD WITHOUT CONTRAST CT CERVICAL  SPINE WITHOUT CONTRAST TECHNIQUE: Multidetector CT imaging of the head and cervical spine was performed following the standard protocol without intravenous contrast. Multiplanar CT image reconstructions of the cervical spine were also generated. RADIATION DOSE REDUCTION: This exam was performed according to the departmental dose-optimization program which includes automated exposure control, adjustment of the mA and/or kV according to patient size and/or use of iterative reconstruction technique. COMPARISON:  CT head 05/01/2017. FINDINGS: CT HEAD FINDINGS Brain: Cerebral ventricle sizes are concordant with the degree of cerebral volume loss. Patchy and confluent areas of decreased attenuation are noted throughout the deep and periventricular white matter of the cerebral hemispheres bilaterally, compatible with chronic microvascular ischemic disease. No evidence of large-territorial acute infarction. No parenchymal hemorrhage. No mass lesion. No extra-axial collection. No mass effect or midline shift. No hydrocephalus. Basilar cisterns are  patent. Vascular: No hyperdense vessel. Atherosclerotic calcifications are present within the cavernous internal carotid and vertebral arteries. Skull: No acute fracture or focal lesion. Sinuses/Orbits: Paranasal sinuses and mastoid air cells are clear. The orbits are unremarkable. Other: Left likely subacute 6 mm scalp hematoma. Midline likely subacute 5 mm scalp hematoma. CT CERVICAL SPINE FINDINGS Alignment: Reversal of normal cervical lordosis centered at the C5 level likely due to positioning and degenerative changes. Grade 1 anterolisthesis of C3 on C4 and C4 on C5. Skull base and vertebrae: Multilevel mild-to-moderate degenerative change of the spine most prominent at the C5-C6 levels. No associated severe osseous neural foraminal or central canal stenosis. No acute fracture. No aggressive appearing focal osseous lesion or focal pathologic process. Soft tissues and spinal canal: No prevertebral fluid or swelling. No visible canal hematoma. Upper chest: Unremarkable. Other: Atherosclerotic plaque of the carotid arteries within the neck. IMPRESSION: 1. No acute intracranial abnormality. 2. No acute displaced fracture or traumatic listhesis of the cervical spine. Electronically Signed   By: Morgane  Naveau M.D.   On: 01/28/2024 13:44    Pending Labs Unresulted Labs (From admission, onward)     Start     Ordered   01/28/24 1213  Blood Culture (routine x 2)  (Septic presentation on arrival (screening labs, nursing and treatment orders for obvious sepsis))  BLOOD CULTURE X 2,   STAT      01/28/24 1214   01/28/24 1213  Urinalysis, w/ Reflex to Culture (Infection Suspected) -Urine, Clean Catch  (Septic presentation on arrival (screening labs, nursing and treatment orders for obvious sepsis))  ONCE - URGENT,   URGENT       Question:  Specimen Source  Answer:  Urine, Clean Catch   01/28/24 1214   Signed and Held  Basic metabolic panel  Tomorrow morning,   R        Signed and Held   Signed and Held  CBC   Tomorrow morning,   R        Signed and Held            Vitals/Pain Today's Vitals   01/28/24 1303 01/28/24 1400 01/28/24 1415 01/28/24 1430  BP: (!) 226/114 (!) 224/92 (!) 219/101 (!) 183/98  Pulse: 82 70 82 80  Resp: (!) 22 (!) 25 (!) 24 (!) 25  Temp:      TempSrc:      SpO2: 100% 98% 98% 98%    Isolation Precautions No active isolations  Medications Medications  amLODipine  (NORVASC ) tablet 10 mg (10 mg Oral Given 01/28/24 1342)  carvedilol  (COREG ) tablet 12.5 mg (12.5 mg Oral Given 01/28/24 1342)  hydrALAZINE  (APRESOLINE ) tablet 50 mg (  50 mg Oral Given 01/28/24 1342)  isosorbide  mononitrate (IMDUR ) 24 hr tablet 30 mg (30 mg Oral Given 01/28/24 1342)  potassium chloride  10 mEq in 100 mL IVPB (10 mEq Intravenous New Bag/Given 01/28/24 1536)  0.9 %  sodium chloride  infusion ( Intravenous New Bag/Given 01/28/24 1426)  lactated ringers  bolus 1,000 mL (0 mLs Intravenous Stopped 01/28/24 1504)  nitroGLYCERIN  (NITROGLYN) 2 % ointment 1 inch (1 inch Topical Given 01/28/24 1342)  cefTRIAXone  (ROCEPHIN ) 1 g in sodium chloride  0.9 % 100 mL IVPB (0 g Intravenous Stopped 01/28/24 1504)  doxycycline  (VIBRA -TABS) tablet 100 mg (100 mg Oral Given 01/28/24 1419)  potassium chloride  SA (KLOR-CON  M) CR tablet 40 mEq (40 mEq Oral Given 01/28/24 1419)    Mobility non-ambulatory     Focused Assessments    R Recommendations: See Admitting Provider Note  Report given to:   Additional Notes:

## 2024-01-28 NOTE — ED Notes (Signed)
 Verbal confirmation from provider that [facility] orders are correct and all meds are to be given now

## 2024-01-28 NOTE — ED Triage Notes (Signed)
 Pt bib [facility]ms after call from family that pt was found altered and down by her toilet. Family reported pt has not been taking her medications. Pt smells of UTI.

## 2024-01-29 ENCOUNTER — Encounter (HOSPITAL_COMMUNITY): Payer: Self-pay | Admitting: Internal Medicine

## 2024-01-29 DIAGNOSIS — N179 Acute kidney failure, unspecified: Secondary | ICD-10-CM | POA: Diagnosis not present

## 2024-01-29 DIAGNOSIS — Z515 Encounter for palliative care: Secondary | ICD-10-CM

## 2024-01-29 DIAGNOSIS — Z7189 Other specified counseling: Secondary | ICD-10-CM

## 2024-01-29 DIAGNOSIS — N185 Chronic kidney disease, stage 5: Secondary | ICD-10-CM | POA: Diagnosis not present

## 2024-01-29 DIAGNOSIS — J690 Pneumonitis due to inhalation of food and vomit: Secondary | ICD-10-CM | POA: Diagnosis not present

## 2024-01-29 DIAGNOSIS — G9341 Metabolic encephalopathy: Secondary | ICD-10-CM | POA: Diagnosis not present

## 2024-01-29 DIAGNOSIS — I5032 Chronic diastolic (congestive) heart failure: Secondary | ICD-10-CM

## 2024-01-29 DIAGNOSIS — N178 Other acute kidney failure: Secondary | ICD-10-CM | POA: Diagnosis not present

## 2024-01-29 LAB — PREPARE RBC (CROSSMATCH)

## 2024-01-29 LAB — BASIC METABOLIC PANEL WITH GFR
Anion gap: 11 (ref 5–15)
BUN: 53 mg/dL — ABNORMAL HIGH (ref 8–23)
CO2: 18 mmol/L — ABNORMAL LOW (ref 22–32)
Calcium: 8.3 mg/dL — ABNORMAL LOW (ref 8.9–10.3)
Chloride: 113 mmol/L — ABNORMAL HIGH (ref 98–111)
Creatinine, Ser: 5.51 mg/dL — ABNORMAL HIGH (ref 0.44–1.00)
GFR, Estimated: 7 mL/min — ABNORMAL LOW (ref >60)
Glucose, Bld: 121 mg/dL — ABNORMAL HIGH (ref 70–99)
Potassium: 3.1 mmol/L — ABNORMAL LOW (ref 3.5–5.1)
Sodium: 142 mmol/L (ref 135–145)

## 2024-01-29 LAB — HEMOGLOBIN AND HEMATOCRIT, BLOOD
HCT: 19.7 % — ABNORMAL LOW (ref 36.0–46.0)
HCT: 29.9 % — ABNORMAL LOW (ref 36.0–46.0)
Hemoglobin: 6.5 g/dL — CL (ref 12.0–15.0)
Hemoglobin: 10.1 g/dL — ABNORMAL LOW (ref 12.0–15.0)

## 2024-01-29 LAB — CBC
HCT: 19.8 % — ABNORMAL LOW (ref 36.0–46.0)
Hemoglobin: 6.5 g/dL — CL (ref 12.0–15.0)
MCH: 28.5 pg (ref 26.0–34.0)
MCHC: 32.8 g/dL (ref 30.0–36.0)
MCV: 86.8 fL (ref 80.0–100.0)
Platelets: 289 K/uL (ref 150–400)
RBC: 2.28 MIL/uL — ABNORMAL LOW (ref 3.87–5.11)
RDW: 15.5 % (ref 11.5–15.5)
WBC: 10.4 K/uL (ref 4.0–10.5)
nRBC: 0 % (ref 0.0–0.2)

## 2024-01-29 LAB — GLUCOSE, CAPILLARY
Glucose-Capillary: 115 mg/dL — ABNORMAL HIGH (ref 70–99)
Glucose-Capillary: 183 mg/dL — ABNORMAL HIGH (ref 70–99)
Glucose-Capillary: 202 mg/dL — ABNORMAL HIGH (ref 70–99)
Glucose-Capillary: 119 mg/dL — ABNORMAL HIGH (ref 70–99)

## 2024-01-29 LAB — CK: Total CK: 333 U/L — ABNORMAL HIGH (ref 38–234)

## 2024-01-29 LAB — MAGNESIUM: Magnesium: 1.8 mg/dL (ref 1.7–2.4)

## 2024-01-29 MED ORDER — LACTATED RINGERS IV SOLN: INTRAVENOUS | Status: DC

## 2024-01-29 MED ORDER — POTASSIUM CHLORIDE CRYS ER 20 MEQ PO TBCR: 20.0000 meq | EXTENDED_RELEASE_TABLET | Freq: Once | ORAL | Status: DC

## 2024-01-29 MED ORDER — POTASSIUM CHLORIDE CRYS ER 20 MEQ PO TBCR
40.0000 meq | EXTENDED_RELEASE_TABLET | Freq: Once | ORAL | Status: AC
  Administered 2024-01-29: 40 meq via ORAL
  Filled 2024-01-29: qty 2

## 2024-01-29 MED ORDER — SODIUM CHLORIDE 0.9% IV SOLUTION: Freq: Once | INTRAVENOUS | Status: AC

## 2024-01-29 NOTE — Consult Note (Signed)
 Consultation Note Date: 01/29/2024   Patient Name: Maria Parrish  DOB: May 31, 1946  MRN: [id number]  Age / Sex: 78 y.o., female  PCP: Margarete Maeola DASEN, FNP Referring Physician: Evonnie Lenis, MD  Reason for Consultation: Establishing goals of care  HPI/Patient Profile: 78 y.o. female  with past medical history of contact impairment, CKD stage V, diabetes mellitus type 2, hypertension, hyperlipidemia, dCHF, and COPD admitted on 01/28/2024 with acute on chronic renal failure with CKD 5, acute metabolic encephalopathy,.   Clinical Assessment and Goals of Care: I have reviewed medical records including EPIC notes, labs and imaging, received report from RN, assessed the patient.  Maria Parrish is lying quietly in bed.  She appears acutely/chronically ill and very frail.  She greets me, making and mostly keeping eye contact.  She is alert, but oriented to self only.  I am not sure that she can make her basic needs known.  Her sister, Heron, is present at bedside.  Limited discussions and interaction with Maria Parrish today due to her mental status.  Brief life review of the patient, from 11/19/23 Palliative note:   We discussed a brief life review of the patient.  Maria Parrish tells me that she is a widow.  She has 2 children, sons Cathlyn and Lochearn.  She lives in her son Gregory's home.  She has been independent with ADLs and does do some light housework around the house.  She tells me that she lives in the [facility] community.  We talked about her chronic kidney disease and she shares that she sees her trusted nephrologist, Dr. Rachele.   Call to son, Ellen Goris, to discuss diagnosis prognosis, GOC, EOL wishes, disposition and options.  No answer, unable to leave voicemail message.   Advanced directives, concepts specific to code status, artifical feeding and hydration, and rehospitalization were considered and discussed.   Maria Parrish  is known DNR.  She has repeatedly stated that she would not accept hemodialysis even at the cost of her life.  Palliative Care services outpatient were considered.  [name] was to be active with [facility] for outpatient palliative services upon her discharge at the beginning of May.  Conference with attending, bedside nursing staff, transition of care team related to patient condition, needs, goals of care, disposition.    HCPOA  NEXT OF KIN - Maria Parrish has named her 2 sons, Cordella and Rozelle as her healthcare surrogates.     SUMMARY OF RECOMMENDATIONS   At this point continue to treat the treatable but no CPR or intubation Repeatedly declined HD in the past PMT to follow   Code Status/Advance Care Planning: DNR  Symptom Management:  Per hospitalist/nephrologist, no additional needs at this time. No morphine in ESRD  Palliative Prophylaxis:  Frequent Pain Assessment and Oral Care  Additional Recommendations (Limitations, Scope, Preferences): Continue to treat but no CPR or intubation, no HD  Psycho-social/Spiritual:  Desire for further Chaplaincy support:no Additional Recommendations: Caregiving  Support/Resources and Education on Hospice  Prognosis:  Unable to  determine, based on outcomes.  Guarded at this point.  May not be able to recover during this hospital stay  Discharge Planning: To Be Determined      Primary Diagnoses: Present on Admission:  Acute renal failure superimposed on stage 5 chronic kidney disease, not on chronic dialysis (HCC)  Mixed hyperlipidemia   I have reviewed the medical record, interviewed the patient and family, and examined the patient. The following aspects are pertinent.  Past Medical History:  Diagnosis Date   Assistance needed for mobility    walks with walker   Confusion    Dementia (HCC)    Diabetes mellitus without complication (HCC)    Generalized weakness    Heart failure with improved ejection fraction (HFimpEF) (HCC)     a. EF 40-45% by echo in 04/2020 b. normalized to 55-60% by echo in 06/2021   Hypercalcemia    Hyperlipidemia    Hyperparathyroidism (HCC)    Hypertension    Hypertension    Moderate protein-calorie malnutrition (HCC)    Peripheral neuropathy    UTI (lower urinary tract infection)    Social History   Socioeconomic History   Marital status: Widowed    Spouse name: Not on file   Number of children: 2   Years of education: Not on file   Highest education level: Not on file  Occupational History   Occupation: fabricator for [employer]  Tobacco Use   Smoking status: Never   Smokeless tobacco: Never  Vaping Use   Vaping status: Never Used  Substance and Sexual Activity   Alcohol use: No   Drug use: No   Sexual activity: Not Currently  Other Topics Concern   Not on file  Social History Narrative   Not on file   Social Drivers of Health   Financial Resource Strain: Not on file  Food Insecurity: No Food Insecurity (01/28/2024)   Hunger Vital Sign    Worried About Running Out of Food in the Last Year: Never true    Ran Out of Food in the Last Year: Never true  Transportation Needs: No Transportation Needs (01/28/2024)   PRAPARE - Administrator, Civil Service (Medical): No    Lack of Transportation (Non-Medical): No  Physical Activity: Inactive (08/20/2020)   Exercise Vital Sign    Days of Exercise per Week: 0 days    Minutes of Exercise per Session: 0 min  Stress: Not on file  Social Connections: Moderately Integrated (01/28/2024)   Social Connection and Isolation Panel    Frequency of Communication with Friends and Family: More than three times a week    Frequency of Social Gatherings with Friends and Family: Once a week    Attends Religious Services: 1 to 4 times per year    Active Member of [employer] or Organizations: Yes    Attends Banker Meetings: 1 to 4 times per year    Marital Status: Widowed   Family History  Problem Relation Age of Onset   Heart  attack Mother    Diabetes Father    Hypertension Brother    Hypertension Brother    Diabetes Brother    Colon cancer Neg Hx    Scheduled Meds:  sodium chloride    Intravenous Once   amLODipine   10 mg Oral Daily   carvedilol   12.5 mg Oral BID WC   doxycycline   100 mg Oral Q12H   ferrous sulfate   325 mg Oral Q breakfast   heparin   5,000 Units Subcutaneous  Q8H   hydrALAZINE   50 mg Oral TID   insulin  aspart  0-5 Units Subcutaneous QHS   insulin  aspart  0-9 Units Subcutaneous TID WC   isosorbide  mononitrate  30 mg Oral Daily   pantoprazole   40 mg Oral QAC breakfast   sodium bicarbonate   650 mg Oral BID   Continuous Infusions:  ceFEPime  (MAXIPIME ) IV     PRN Meds:.acetaminophen  **OR** acetaminophen , ondansetron  **OR** ondansetron  (ZOFRAN ) IV Medications Prior to Admission:  Prior to Admission medications   Medication Sig Start Date End Date Taking? Authorizing Provider  albuterol  (VENTOLIN  HFA) 108 (90 Base) MCG/ACT inhaler Inhale 2 puffs into the lungs every 6 (six) hours as needed for wheezing or shortness of breath. 09/30/23  Yes Ricky Fines, MD  amLODipine  (NORVASC ) 10 MG tablet Take 1 tablet by mouth once daily 01/01/23  Yes Branch, Dorn FALCON, MD  carvedilol  (COREG ) 12.5 MG tablet Take 1 tablet by mouth twice daily Patient taking differently: Take 12.5 mg by mouth daily. 04/11/23  Yes BranchDorn FALCON, MD  Cyanocobalamin  (VITAMIN B-12 PO) Take 1 tablet by mouth daily.   Yes [provider]  ezetimibe  (ZETIA ) 10 MG tablet Take 1 tablet (10 mg total) by mouth daily. 05/11/23  Yes Strader, Laymon HERO, PA-C  ferrous sulfate  325 (65 FE) MG tablet Take 325 mg by mouth daily with breakfast.   Yes [provider]  hydrALAZINE  (APRESOLINE ) 100 MG tablet Take 0.5 tablets (50 mg total) by mouth 3 (three) times daily. 11/21/23  Yes Johnson, Clanford L, MD  pantoprazole  (PROTONIX ) 40 MG tablet Take 1 tablet (40 mg total) by mouth daily. 09/30/23 09/29/24 Yes Ricky Fines, MD   sodium bicarbonate  650 MG tablet Take 1 tablet (650 mg total) by mouth 2 (two) times daily. 09/30/23  Yes Ricky Fines, MD  epoetin  alfa-epbx (RETACRIT ) 4000 UNIT/ML injection Inject 4,000 Units into the skin 2 (two) times a week. Friday    [provider]  isosorbide  mononitrate (IMDUR ) 30 MG 24 hr tablet Take 1 tablet (30 mg total) by mouth daily. 12/29/20   Nida, Gebreselassie W, MD  torsemide  (DEMADEX ) 20 MG tablet Take 1 tablet (20 mg total) by mouth 2 (two) times daily. 11/21/23   Johnson, Clanford L, MD  TRESIBA  FLEXTOUCH 100 UNIT/ML FlexTouch Pen Inject 28 Units into the skin at bedtime. 01/02/24   Nida, Gebreselassie W, MD   Allergies  Allergen Reactions   Statins Other (See Comments)    Makes wrists and fingers ache Muscle Pain and Joint Pain   Penicillins Other (See Comments)    Makes patient sore.   Review of Systems  Unable to perform ROS: Acuity of condition    Physical Exam Vitals and nursing note reviewed.  Constitutional:      General: She is not in acute distress.    Appearance: She is ill-appearing.  HENT:     Mouth/Throat:     Mouth: Mucous membranes are moist.  Cardiovascular:     Rate and Rhythm: Normal rate.  Pulmonary:     Effort: Pulmonary effort is normal. No respiratory distress.  Skin:    Coloration: Skin is pale.  Neurological:     Mental Status: She is alert.     Comments: Oriented to self only  Psychiatric:     Comments: Calm and cooperative     Vital Signs: BP (!) 148/62   Pulse 65   Temp 97.9 F (36.6 C) (Oral)   Resp 19   SpO2 96%  Pain Scale:  0-10   Pain Score: 0-No pain   SpO2: SpO2: 96 % O2 Device:SpO2: 96 % O2 Flow Rate: .   IO: Intake/output summary:  Intake/Output Summary (Last 24 hours) at 01/29/2024 1003 Last data filed at 01/28/2024 1504 Gross per 24 hour  Intake 1101.27 ml  Output --  Net 1101.27 ml    LBM:   Baseline Weight:   Most recent weight:       Palliative Assessment/Data:     Time In:  1230   Time Out: 1310 Time Total: 40 minutes  Greater than 50%  of this time was spent counseling and coordinating care related to the above assessment and plan.  Signed by: Lorenza DELENA Birkenhead, NP   Please contact Palliative Medicine Team phone at [phone] for questions and concerns.  For individual provider: See Tracey

## 2024-01-29 NOTE — Progress Notes (Addendum)
 PROGRESS NOTE  Maria Parrish FMW:[id number] DOB: 05/22/1946 DOA: 01/28/2024 PCP: Margarete Maeola DASEN, FNP  Brief History:  78 year old female with a history of contact impairment, CKD stage V, diabetes mellitus type 2, hypertension, hyperlipidemia, dCHF, and COPD presenting with altered mental status.  History is supplemented by the patient's son at the bedside.  The patient's son states that he was going to get the patient ready to go get blood work done when he found the patient on the floor next to her bedside commode and bed.  He stated that she was awake but confused.  The son states that the patient has not been feeling well since last Thursday, 01/24/2024.  On that day, the patient had a couple episodes of emesis and loose stools.  Since then, the patient has had decreased oral intake and generalized weakness.  There were no complaints of fevers, chills, chest pain, shortness breath, abdominal pain, headache. Because the patient has had decreased oral intake and vomiting, the patient's son made the decision not to give her her usual medications over the last couple days.  He states that he held the patient's medications starting 01/25/2024. At baseline, the patient is able to ambulate and make transfers with a walker.  She is conversant but is unaware of the date at baseline. Presently, the patient denies any headache, chest pain, shortness of breath, coughing, hemoptysis, vomiting, abdominal pain, diarrhea. In the ED, the patient was afebrile and hemodynamically stable with oxygen  saturation 98% on room air.  WBC 11.3, hemoglobin 8.5, platelets 396.  Sodium 142, potassium 2.3, bicarbonate 17, serum creatinine 5.97.  LFTs were unremarkable.  COVID-19 PCR was negative. CT of the brain was negative for acute findings.  CT of the cervical spine was negative for any traumatic injuries.  CT of the CAP showed LLL and RML patchy opacities with bilateral trace pleural effusions.  There is no other acute  intrathoracic or intra-abdominal traumatic injury.  There was old healed sacral insufficiency fractures and old left inferior pubic rami fractures.  There was some subcutaneous edema about the left hip.  CT of the L-spine showed age-indeterminate L1 fracture with 35% height loss.  CT of the thoracic spine was negative for any injury.  Patient was started on IV fluids, doxycycline , and ceftriaxone  in the emergency department.  For her elevated blood pressure, she was given amlodipine , carvedilol , hydralazine  50 mg, and Imdur .  Notably, the patient was was admitted to the hospital from 11/18/2023 to 11/21/2023 secondary to aDHF.  She was treated with IV furosemide  and discharged with torsemide  20 mg twice daily.  She was transfused 1 unit PRBC.  During that hospitalization she was seen by palliative medicine and was made DNR.   Assessment/Plan:  Acute on chronic renal failure--CKD 5 - Baseline creatinine 4.1-4.4 - Presented with serum creatinine 5.97 - Secondary to volume depletion - Continue IV fluids   Lobar pneumonia/aspiration pneumonia - Started cefepime  and continue doxycycline  - MRSA screen -negative on 09/28/2023 - Follow-up blood cultures   Acute metabolic encephalopathy - Secondary to acute on chronic renal failure and infectious process -Patient is mildly somnolent on examination, but awakens on examination - Further workup if no improvement - CT brain negative for acute findings - UA/culture -B12 1260 -Folic acid  14.4 -TSH 1.211   Chronic HFpEF - Clinically dry on exam - 09/29/2023 echo EF 55 to 60%, no WMA, grade 1 DD, normal RVF, mild MR - Holding torsemide   temporarily   Rhabdomyolysis - CPK 596 - Continue IV fluids - A.m. CK  Essential hypertension/hypertensive urgency - Restart amlodipine , carvedilol , hydralazine , Imdur    Metabolic acidosis - Restart bicarbonate   Diabetes mellitus type 2 with hyperglycemia - NovoLog  sliding scale - 09/29/2023 hemoglobin A1c 6.9 -  Holding Tresiba  temporarily and monitor CBGs   Acute on chronic anemia of CKD - Baseline hemoglobin 8 -Hemoglobin down to 6.5 -no signs of active bleed -transfuse 2 units PRBC   COPD - Continue bronchodilators - No wheeze on examination   L1 compression fracture - No signs of neurologic compromise on exam - PT evaluation   Hypokalemia -replete -check mag 1.8          Family Communication:   Family at bedside  Consultants:  none  Code Status:  DNR  DVT Prophylaxis:  [place] Heparin     Procedures: As Listed in Progress Note Above  Antibiotics: Cefepime  7/14>> Doxy 7/14>>     Subjective: Patient states that she is feeling a better issue respiratory symptoms, chest pain, nausea, vomiting or diarrhea, Donnell pain  Objective: Vitals:   01/29/24 0100 01/29/24 0500 01/29/24 0945 01/29/24 1013  BP: (!) 149/71 (!) 140/78 (!) 148/62 (!) 126/59  Pulse: 68 65 65 61  Resp:    18  Temp: 98.2 F (36.8 C) 97.9 F (36.6 C) 97.9 F (36.6 C) (!) 97.5 F (36.4 C)  TempSrc: Oral Oral Oral Oral  SpO2: 97% 100% 96% 94%    Intake/Output Summary (Last 24 hours) at 01/29/2024 1219 Last data filed at 01/29/2024 1211 Gross per 24 hour  Intake 1341.27 ml  Output --  Net 1341.27 ml   Weight change:  Exam:  General:  Pt is alert, follows commands appropriately, not in acute distress HEENT: No icterus, No thrush, No neck mass, [place]/AT Cardiovascular: RRR, S1/S2, no rubs, no gallops Respiratory: bibasilar rales. No wheeze Abdomen: Soft/+BS, non tender, non distended, no guarding Extremities: No edema, No lymphangitis, No petechiae, No rashes, no synovitis   Data Reviewed: I have personally reviewed following labs and imaging studies Basic Metabolic Panel: Recent Labs  Lab 01/28/24 1227 01/29/24 0439  NA 142 142  K 2.3* 3.1*  CL 106 113*  CO2 17* 18*  GLUCOSE 168* 121*  BUN 52* 53*  CREATININE 5.97* 5.51*  CALCIUM  9.3 8.3*  MG 2.0 1.8   Liver Function  Tests: Recent Labs  Lab 01/28/24 1227  AST 21  ALT 17  ALKPHOS 57  BILITOT 0.9  PROT 7.0  ALBUMIN 3.1*   No results for input(s): LIPASE, AMYLASE in the last 168 hours. Recent Labs  Lab 01/28/24 1400  AMMONIA 19   Coagulation Profile: Recent Labs  Lab 01/28/24 1227  INR 1.2   CBC: Recent Labs  Lab 01/28/24 1227 01/29/24 0439 01/29/24 0717  WBC 11.3* 10.4  --   NEUTROABS 9.9*  --   --   HGB 8.5* 6.5* 6.5*  HCT 23.9* 19.8* 19.7*  MCV 85.4 86.8  --   PLT 396 289  --    Cardiac Enzymes: Recent Labs  Lab 01/28/24 1400 01/29/24 1600  CKTOTAL 496* 333*   BNP: Invalid input(s): POCBNP CBG: Recent Labs  Lab 01/28/24 1654 01/28/24 2117 01/29/24 0748  GLUCAP 166* 155* 115*   HbA1C: No results for input(s): HGBA1C in the last 72 hours. Urine analysis:    Component Value Date/Time   COLORURINE YELLOW 09/28/2023 1939   APPEARANCEUR HAZY (A) 09/28/2023 1939   LABSPEC 1.014 09/28/2023 1939  PHURINE 6.0 09/28/2023 1939   GLUCOSEU >=500 (A) 09/28/2023 1939   HGBUR NEGATIVE 09/28/2023 1939   BILIRUBINUR NEGATIVE 09/28/2023 1939   KETONESUR NEGATIVE 09/28/2023 1939   PROTEINUR >=300 (A) 09/28/2023 1939   UROBILINOGEN 2.0 (H) 03/25/2015 2203   NITRITE NEGATIVE 09/28/2023 1939   LEUKOCYTESUR NEGATIVE 09/28/2023 1939   Sepsis Labs: @LABRCNTIP (procalcitonin:4,lacticidven:4) ) Recent Results (from the past 240 hours)  Blood Culture (routine x 2)     Status: None (Preliminary result)   Collection Time: 01/28/24 12:27 PM   Specimen: BLOOD  Result Value Ref Range Status   Specimen Description BLOOD LEFT ASSIST CONTROL  Final   Special Requests   Final    BOTTLES DRAWN AEROBIC AND ANAEROBIC Blood Culture adequate volume   Culture   Final    NO GROWTH < 24 HOURS Performed at [facility], [address] [place], [place] [address]    Report Status PENDING  Incomplete  Blood Culture (routine x 2)     Status: None (Preliminary result)   Collection Time:  01/28/24 12:27 PM   Specimen: BLOOD  Result Value Ref Range Status   Specimen Description BLOOD RIGHT ASSIST CONTROL  Final   Special Requests   Final    BOTTLES DRAWN AEROBIC AND ANAEROBIC Blood Culture results may not be optimal due to an inadequate volume of blood received in culture bottles   Culture   Final    NO GROWTH < 24 HOURS Performed at [facility], [address] [place], [place] [address]    Report Status PENDING  Incomplete  Resp panel by RT-PCR (RSV, Flu A&B, Covid) Anterior Nasal Swab     Status: None   Collection Time: 01/28/24 12:46 PM   Specimen: Anterior Nasal Swab  Result Value Ref Range Status   SARS Coronavirus 2 by RT PCR NEGATIVE NEGATIVE Final    Comment: (NOTE) SARS-CoV-2 target nucleic acids are NOT DETECTED.  The SARS-CoV-2 RNA is generally detectable in upper respiratory specimens during the acute phase of infection. The lowest concentration of SARS-CoV-2 viral copies this assay can detect is 138 copies/mL. A negative result does not preclude SARS-Cov-2 infection and should not be used as the sole basis for treatment or other patient management decisions. A negative result may occur with  improper specimen collection/handling, submission of specimen other than nasopharyngeal swab, presence of viral mutation(s) within the areas targeted by this assay, and inadequate number of viral copies(<138 copies/mL). A negative result must be combined with clinical observations, patient history, and epidemiological information. The expected result is Negative.  Fact Sheet for Patients:  BloggerCourse.com  Fact Sheet for Healthcare Providers:  SeriousBroker.it  This test is no t yet approved or cleared by the [place]  FDA and  has been authorized for detection and/or diagnosis of SARS-CoV-2 by FDA under an Emergency Use Authorization (EUA). This EUA will remain  in effect (meaning this test can be used)  for the duration of the COVID-19 declaration under Section 564(b)(1) of the Act, 21 U.S.C.section 360bbb-3(b)(1), unless the authorization is terminated  or revoked sooner.       Influenza A by PCR NEGATIVE NEGATIVE Final   Influenza B by PCR NEGATIVE NEGATIVE Final    Comment: (NOTE) The Xpert Xpress SARS-CoV-2/FLU/RSV plus assay is intended as an aid in the diagnosis of influenza from Nasopharyngeal swab specimens and should not be used as a sole basis for treatment. Nasal washings and aspirates are unacceptable for Xpert Xpress SARS-CoV-2/FLU/RSV testing.  Fact Sheet for Patients:  BloggerCourse.com  Fact Sheet for Healthcare Providers: SeriousBroker.it  This test is not yet approved or cleared by the [place]  FDA and has been authorized for detection and/or diagnosis of SARS-CoV-2 by FDA under an Emergency Use Authorization (EUA). This EUA will remain in effect (meaning this test can be used) for the duration of the COVID-19 declaration under Section 564(b)(1) of the Act, 21 U.S.C. section 360bbb-3(b)(1), unless the authorization is terminated or revoked.     Resp Syncytial Virus by PCR NEGATIVE NEGATIVE Final    Comment: (NOTE) Fact Sheet for Patients: BloggerCourse.com  Fact Sheet for Healthcare Providers: SeriousBroker.it  This test is not yet approved or cleared by the [place]  FDA and has been authorized for detection and/or diagnosis of SARS-CoV-2 by FDA under an Emergency Use Authorization (EUA). This EUA will remain in effect (meaning this test can be used) for the duration of the COVID-19 declaration under Section 564(b)(1) of the Act, 21 U.S.C. section 360bbb-3(b)(1), unless the authorization is terminated or revoked.  Performed at [facility], [address] [place], [place] [address]      Scheduled Meds:  sodium chloride    Intravenous Once    amLODipine   10 mg Oral Daily   carvedilol   12.5 mg Oral BID WC   doxycycline   100 mg Oral Q12H   ferrous sulfate   325 mg Oral Q breakfast   heparin   5,000 Units Subcutaneous Q8H   hydrALAZINE   50 mg Oral TID   insulin  aspart  0-5 Units Subcutaneous QHS   insulin  aspart  0-9 Units Subcutaneous TID WC   isosorbide  mononitrate  30 mg Oral Daily   pantoprazole   40 mg Oral QAC breakfast   sodium bicarbonate   650 mg Oral BID   Continuous Infusions:  ceFEPime  (MAXIPIME ) IV      Procedures/Studies: CT CHEST ABDOMEN PELVIS WO CONTRAST Result Date: 01/28/2024 CLINICAL DATA:  Polytrauma, blunt Table formatting from the original note was not included. Pt bib rcems after call from family that pt was found altered and down by her toilet. Family reported pt has not been taking her medications. Pt smells of UTI. EXAM: CT CHEST, ABDOMEN AND PELVIS WITHOUT CONTRAST TECHNIQUE: Multidetector CT imaging of the chest, abdomen and pelvis was performed following the standard protocol without IV contrast. RADIATION DOSE REDUCTION: This exam was performed according to the departmental dose-optimization program which includes automated exposure control, adjustment of the mA and/or kV according to patient size and/or use of iterative reconstruction technique. COMPARISON:  None Available. FINDINGS: CHEST: Cardiovascular: The thoracic aorta is normal in caliber. The heart is enlarged in size. No significant pericardial effusion. Cardiac findings suggestive of anemia. Severe atherosclerotic plaque. Four-vessel coronary artery calcification. Lungs/Pleura: Slight mosaic attenuation of the bilateral upper lobes. Trace biapical interlobular septal wall thickening. Left lower lobe and right middle lobe patchy airspace opacity. No pulmonary nodule. No pulmonary mass. No pulmonary contusion or laceration. No pneumatocele formation. Bilateral trace to small volume pleural effusions. No pneumothorax. No hemothorax. Mediastinum/Nodes: No  pneumomediastinum. The central airways are patent. The esophagus is unremarkable. The thyroid  is unremarkable. Limited evaluation for hilar lymphadenopathy on this noncontrast study. No mediastinal or axillary lymphadenopathy. Musculoskeletal/Chest wall No chest wall mass. No acute rib or sternal fracture. No spinal fracture. ABDOMEN / PELVIS: Hepatobiliary: Not enlarged. No focal lesion. The gallbladder is otherwise unremarkable with no radio-opaque gallstones. No biliary ductal dilatation. Pancreas: Normal pancreatic contour. No main pancreatic duct dilatation. Spleen: Not enlarged. No focal lesion. Adrenals/Urinary Tract: Left adrenal gland hyperplasia.  No nodularity bilaterally. No hydroureteronephrosis. No ureterolithiasis. Indications associated with the kidneys are likely vascular. Possible tiny nonobstructive nephrolithiasis noted bilaterally. No contour deforming renal mass. The urinary bladder is grossly unremarkable unremarkable. Stomach/Bowel: No small or large bowel wall thickening or dilatation. Colonic diverticulosis. The appendix is unremarkable. Vasculature/Lymphatic: Severe atherosclerotic plaque. No abdominal aorta or iliac aneurysm. No abdominal, pelvic, inguinal lymphadenopathy. Reproductive: Lobulated uterus with multiple calcified lesions. Other: No simple free fluid ascites. No pneumoperitoneum. No mesenteric hematoma identified. No organized fluid collection. Musculoskeletal: Left hip subcutaneus soft tissue edema with possible developing hematoma. No acute pelvic fracture. Severe degenerative changes of the bilateral hips. Old healed sacral insufficiency fractures. Old healed left inferior pubic rami fracture. Please see separately dictated CT thoracolumbar spine. Other ports and devices: None. IMPRESSION: 1. Left lower lobe and right middle lobe patchy airspace opacity. Findings suggestive of multifocal pneumonia. 2. Bilateral trace to small volume pleural effusions. 3. No acute  intrathoracic, intra-abdominal, intrapelvic traumatic injury with limited evaluation on this noncontrast study. 4. Please see separately dictated CT thoracolumbar spine. 5. Other imaging findings of potential clinical significance: Cardiomegaly. Colonic diverticulosis with no acute diverticulitis. Degenerative uterine fibroids. Aortic Atherosclerosis (ICD10-I70.0) including four-vessel coronary artery calcification. Electronically Signed   By: Morgane  Naveau M.D.   On: 01/28/2024 14:02   CT T-SPINE NO CHARGE Result Date: 01/28/2024 CLINICAL DATA:  found altered and down by her toilet. Family reported pt has not been taking her medications. Pt smells of UTI. EXAM: CT THORACIC AND LUMBAR SPINE WITHOUT CONTRAST TECHNIQUE: Multidetector CT imaging of the thoracic and lumbar spine was performed without contrast. Multiplanar CT image reconstructions were also generated. RADIATION DOSE REDUCTION: This exam was performed according to the departmental dose-optimization program which includes automated exposure control, adjustment of the mA and/or kV according to patient size and/or use of iterative reconstruction technique. COMPARISON:  Chest x-ray 05/01/2017 FINDINGS: CT THORACIC SPINE FINDINGS Alignment: Normal. Vertebrae: Chronic vague superior endplate concavity at the T2, T3, T4 levels. No acute fracture or focal pathologic process. Paraspinal and other soft tissues: Negative. Disc levels: Maintained. CT LUMBAR SPINE FINDINGS Segmentation: 5 lumbar type vertebrae. Alignment: Normal. Vertebrae: Multilevel moderate severe degenerative changes spine most prominent at the L4-L5 and L5-S1 levels. Age-indeterminate L1 superior endplate compression fracture with prominent Schmorl node. At least 35% vertebral body height loss. No focal pathologic process. Paraspinal and other soft tissues: Negative. Disc levels: Multilevel intervertebral disc space vacuum phenomenon. Other: Please see separately dictated CT chest abdomen  pelvis 01/28/2024. IMPRESSION: CT THORACIC SPINE IMPRESSION 1. No acute displaced fracture or traumatic listhesis of the thoracic spine. CT LUMBAR SPINE IMPRESSION 1. Age-indeterminate L1 superior endplate compression fracture with prominent Schmorl node. At least 35% vertebral body height loss. Correlate with point tenderness to palpation to evaluate for an acute component. 1. Other: Please see separately dictated CT chest abdomen pelvis 01/28/2024. Electronically Signed   By: Morgane  Naveau M.D.   On: 01/28/2024 13:54   CT L-SPINE NO CHARGE Result Date: 01/28/2024 CLINICAL DATA:  found altered and down by her toilet. Family reported pt has not been taking her medications. Pt smells of UTI. EXAM: CT THORACIC AND LUMBAR SPINE WITHOUT CONTRAST TECHNIQUE: Multidetector CT imaging of the thoracic and lumbar spine was performed without contrast. Multiplanar CT image reconstructions were also generated. RADIATION DOSE REDUCTION: This exam was performed according to the departmental dose-optimization program which includes automated exposure control, adjustment of the mA and/or kV according to patient size and/or use of iterative reconstruction technique.  COMPARISON:  Chest x-ray 05/01/2017 FINDINGS: CT THORACIC SPINE FINDINGS Alignment: Normal. Vertebrae: Chronic vague superior endplate concavity at the T2, T3, T4 levels. No acute fracture or focal pathologic process. Paraspinal and other soft tissues: Negative. Disc levels: Maintained. CT LUMBAR SPINE FINDINGS Segmentation: 5 lumbar type vertebrae. Alignment: Normal. Vertebrae: Multilevel moderate severe degenerative changes spine most prominent at the L4-L5 and L5-S1 levels. Age-indeterminate L1 superior endplate compression fracture with prominent Schmorl node. At least 35% vertebral body height loss. No focal pathologic process. Paraspinal and other soft tissues: Negative. Disc levels: Multilevel intervertebral disc space vacuum phenomenon. Other: Please see  separately dictated CT chest abdomen pelvis 01/28/2024. IMPRESSION: CT THORACIC SPINE IMPRESSION 1. No acute displaced fracture or traumatic listhesis of the thoracic spine. CT LUMBAR SPINE IMPRESSION 1. Age-indeterminate L1 superior endplate compression fracture with prominent Schmorl node. At least 35% vertebral body height loss. Correlate with point tenderness to palpation to evaluate for an acute component. 1. Other: Please see separately dictated CT chest abdomen pelvis 01/28/2024. Electronically Signed   By: Morgane  Naveau M.D.   On: 01/28/2024 13:54   DG Chest Port 1 View Result Date: 01/28/2024 CLINICAL DATA:  Questionable sepsis - evaluate for abnormality EXAM: PORTABLE CHEST 1 VIEW COMPARISON:  CT chest 01/28/2024, chest x-ray 11/18/2023 FINDINGS: The heart and mediastinal contours are unchanged. Atherosclerotic plaque. Left lower lobe and right middle lobe patchy airspace opacities. No pulmonary edema. Bilateral trace to small volume pleural effusions. No pneumothorax. No acute osseous abnormality. IMPRESSION: 1. Left lower lobe and right middle lobe patchy airspace opacities. Findings suggestive of multifocal pneumonia. Followup PA and lateral chest X-ray is recommended in 3-4 weeks following therapy to ensure resolution. 2. Bilateral trace to small volume pleural effusions. Electronically Signed   By: Morgane  Naveau M.D.   On: 01/28/2024 13:46   CT Head Wo Contrast Result Date: 01/28/2024 CLINICAL DATA:  Head trauma, moderate-severe; Neck trauma (Age >= 65y) call from family that pt was found altered and down by her toilet. Family reported pt has not been taking her medications. Pt smells of UTI. EXAM: CT HEAD WITHOUT CONTRAST CT CERVICAL SPINE WITHOUT CONTRAST TECHNIQUE: Multidetector CT imaging of the head and cervical spine was performed following the standard protocol without intravenous contrast. Multiplanar CT image reconstructions of the cervical spine were also generated. RADIATION DOSE  REDUCTION: This exam was performed according to the departmental dose-optimization program which includes automated exposure control, adjustment of the mA and/or kV according to patient size and/or use of iterative reconstruction technique. COMPARISON:  CT head 05/01/2017. FINDINGS: CT HEAD FINDINGS Brain: Cerebral ventricle sizes are concordant with the degree of cerebral volume loss. Patchy and confluent areas of decreased attenuation are noted throughout the deep and periventricular white matter of the cerebral hemispheres bilaterally, compatible with chronic microvascular ischemic disease. No evidence of large-territorial acute infarction. No parenchymal hemorrhage. No mass lesion. No extra-axial collection. No mass effect or midline shift. No hydrocephalus. Basilar cisterns are patent. Vascular: No hyperdense vessel. Atherosclerotic calcifications are present within the cavernous internal carotid and vertebral arteries. Skull: No acute fracture or focal lesion. Sinuses/Orbits: Paranasal sinuses and mastoid air cells are clear. The orbits are unremarkable. Other: Left likely subacute 6 mm scalp hematoma. Midline likely subacute 5 mm scalp hematoma. CT CERVICAL SPINE FINDINGS Alignment: Reversal of normal cervical lordosis centered at the C5 level likely due to positioning and degenerative changes. Grade 1 anterolisthesis of C3 on C4 and C4 on C5. Skull base and vertebrae: Multilevel mild-to-moderate degenerative change  of the spine most prominent at the C5-C6 levels. No associated severe osseous neural foraminal or central canal stenosis. No acute fracture. No aggressive appearing focal osseous lesion or focal pathologic process. Soft tissues and spinal canal: No prevertebral fluid or swelling. No visible canal hematoma. Upper chest: Unremarkable. Other: Atherosclerotic plaque of the carotid arteries within the neck. IMPRESSION: 1. No acute intracranial abnormality. 2. No acute displaced fracture or traumatic  listhesis of the cervical spine. Electronically Signed   By: Morgane  Naveau M.D.   On: 01/28/2024 13:44   CT Cervical Spine Wo Contrast Result Date: 01/28/2024 CLINICAL DATA:  Head trauma, moderate-severe; Neck trauma (Age >= 65y) call from family that pt was found altered and down by her toilet. Family reported pt has not been taking her medications. Pt smells of UTI. EXAM: CT HEAD WITHOUT CONTRAST CT CERVICAL SPINE WITHOUT CONTRAST TECHNIQUE: Multidetector CT imaging of the head and cervical spine was performed following the standard protocol without intravenous contrast. Multiplanar CT image reconstructions of the cervical spine were also generated. RADIATION DOSE REDUCTION: This exam was performed according to the departmental dose-optimization program which includes automated exposure control, adjustment of the mA and/or kV according to patient size and/or use of iterative reconstruction technique. COMPARISON:  CT head 05/01/2017. FINDINGS: CT HEAD FINDINGS Brain: Cerebral ventricle sizes are concordant with the degree of cerebral volume loss. Patchy and confluent areas of decreased attenuation are noted throughout the deep and periventricular white matter of the cerebral hemispheres bilaterally, compatible with chronic microvascular ischemic disease. No evidence of large-territorial acute infarction. No parenchymal hemorrhage. No mass lesion. No extra-axial collection. No mass effect or midline shift. No hydrocephalus. Basilar cisterns are patent. Vascular: No hyperdense vessel. Atherosclerotic calcifications are present within the cavernous internal carotid and vertebral arteries. Skull: No acute fracture or focal lesion. Sinuses/Orbits: Paranasal sinuses and mastoid air cells are clear. The orbits are unremarkable. Other: Left likely subacute 6 mm scalp hematoma. Midline likely subacute 5 mm scalp hematoma. CT CERVICAL SPINE FINDINGS Alignment: Reversal of normal cervical lordosis centered at the C5  level likely due to positioning and degenerative changes. Grade 1 anterolisthesis of C3 on C4 and C4 on C5. Skull base and vertebrae: Multilevel mild-to-moderate degenerative change of the spine most prominent at the C5-C6 levels. No associated severe osseous neural foraminal or central canal stenosis. No acute fracture. No aggressive appearing focal osseous lesion or focal pathologic process. Soft tissues and spinal canal: No prevertebral fluid or swelling. No visible canal hematoma. Upper chest: Unremarkable. Other: Atherosclerotic plaque of the carotid arteries within the neck. IMPRESSION: 1. No acute intracranial abnormality. 2. No acute displaced fracture or traumatic listhesis of the cervical spine. Electronically Signed   By: Morgane  Naveau M.D.   On: 01/28/2024 13:44    Alm Schneider, DO  [facility]  If 7PM-7AM, please contact night-coverage [email or web address] Password [email or web address] 01/29/2024, 12:19 PM   LOS: 1 day

## 2024-01-29 NOTE — TOC Initial Note (Signed)
 Transition of Care [facility]) - Initial/Assessment Note    Patient Details  Name: Maria Parrish MRN: [id number] Date of Birth: [birth year]  [facility]) CM/SW Contact:    Lucie Lunger, LCSWA Phone Number: 01/29/2024, 12:51 PM  Clinical Narrative:                 Pt is high risk for readmission. CSW notes per chart review that pt lives with her son but is normally fairly independent with her ADLs. Pt has had HH with [facility] at her last hospital admission in May. CSW attempted to reach pts son to complete assessment, unable to reach and VM box is full. CSW to attempt to reach at a later date. TOC to follow.   Expected Discharge Plan: Home w Home Health Services Barriers to Discharge: Continued Medical Work up   Patient Goals and CMS Choice Patient states their goals for this hospitalization and ongoing recovery are:: get better CMS Medicare.gov Compare Post Acute Care list provided to:: Patient Represenative (must comment) Choice offered to / list presented to : Adult Children      Expected Discharge Plan and Services In-house Referral: Clinical Social Work Discharge Planning Services: CM Consult   Living arrangements for the past 2 months: Single Family Home                                      Prior Living Arrangements/Services Living arrangements for the past 2 months: Single Family Home Lives with:: Self, Adult Children Patient language and need for interpreter reviewed:: Yes Do you feel safe going back to the place where you live?: Yes      Need for Family Participation in Patient Care: Yes (Comment) Care giver support system in place?: Yes (comment) Current home services: DME Criminal Activity/Legal Involvement Pertinent to Current Situation/Hospitalization: No - Comment as needed  Activities of Daily Living   ADL Screening (condition at time of admission) Independently performs ADLs?: No Does the patient have a NEW difficulty with  bathing/dressing/toileting/self-feeding that is expected to last >3 days?: No Does the patient have a NEW difficulty with getting in/out of bed, walking, or climbing stairs that is expected to last >3 days?: Yes (Initiates electronic notice to provider for possible PT consult) Does the patient have a NEW difficulty with communication that is expected to last >3 days?: No Is the patient deaf or have difficulty hearing?: Yes Does the patient have difficulty seeing, even when wearing glasses/contacts?: Yes Does the patient have difficulty concentrating, remembering, or making decisions?: No  Permission Sought/Granted                  Emotional Assessment Appearance:: Appears stated age       Alcohol / Substance Use: Not Applicable Psych Involvement: No (comment)  Admission diagnosis:  Hypokalemia [E87.6] Generalized weakness [R53.1] Fall, initial encounter [W19.XXXA] Multifocal pneumonia [J18.9] Acute renal failure superimposed on stage 5 chronic kidney disease, not on chronic dialysis (HCC) [N17.9, N18.5] Hypertension, unspecified type [I10] Patient Active Problem List   Diagnosis Date Noted   Acute renal failure superimposed on stage 5 chronic kidney disease, not on chronic dialysis (HCC) 01/28/2024   Aspiration pneumonitis (HCC) 01/28/2024   Lobar pneumonia (HCC) 01/28/2024   Chronic heart failure with preserved ejection fraction (HFpEF) (HCC) 01/28/2024   Acute metabolic encephalopathy 01/28/2024   Acute hypoxemic respiratory failure (HCC) 09/28/2023   Diabetes mellitus with  stage 5 chronic kidney disease (HCC) 02/27/2023   Insulin  long-term use (HCC) 02/27/2023   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) 06/15/2022   Statin intolerance 01/03/2022   Peripheral arterial disease (HCC) 09/07/2020   Chronic kidney disease 05/27/2020   Acute on chronic diastolic (congestive) heart failure (HCC) 05/13/2020   Acute on chronic diastolic congestive heart failure (HCC)  05/12/2020   Elevated serum creatinine 05/12/2020   Non compliance with medical treatment 12/17/2017   Hyperparathyroidism (HCC) 10/19/2016   Vitamin D  deficiency 01/12/2016   Altered mental status 09/22/2015   Left hemiparesis (HCC) 09/22/2015   Acute encephalopathy 09/22/2015   Palliative care encounter    UTI (urinary tract infection) 09/14/2015   Dementia (HCC) 09/14/2015   General weakness 09/14/2015   Subacute confusional state 09/14/2015   Type 2 diabetes mellitus without complication, with long-term current use of insulin  (HCC) 09/14/2015   Confusion 09/14/2015   Fall at home    Elevated parathyroid  hormone 09/09/2015   Mucosal abnormality of stomach    Diverticulosis of colon without hemorrhage    Hypercalcemia 08/10/2015   Goiter 08/10/2015   Abnormal weight loss 07/28/2015   Uncontrolled type 2 diabetes mellitus 03/26/2015   Neuropathy 03/26/2015   Hypokalemia 03/26/2015   Essential hypertension, benign 03/26/2015   Mixed hyperlipidemia 03/26/2015   Malnutrition of moderate degree (HCC) 03/26/2015   PCP:  Margarete Maeola DASEN, FNP Pharmacy:   [facility] [address], [place] - [address] [place] [address] Phone: [phone] Fax: [phone]  [facility] - [place], [place] - [address] [place] [address] Phone: [phone] Fax: [phone]     Social Drivers of Health (SDOH) Social History: SDOH Screenings   Food Insecurity: No Food Insecurity (01/28/2024)  Housing: Low Risk  (01/28/2024)  Transportation Needs: No Transportation Needs (01/28/2024)  Utilities: Not At Risk (01/28/2024)  Depression (PHQ2-9): Low Risk  (09/14/2023)  Physical Activity: Inactive (08/20/2020)  Social Connections: Moderately Integrated (01/28/2024)  Tobacco Use: Low Risk  (01/29/2024)   SDOH Interventions:     Readmission Risk Interventions    01/29/2024   12:50 PM 11/19/2023    3:13 PM 09/29/2023     4:26 PM  Readmission Risk Prevention Plan  Transportation Screening Complete Complete Complete  PCP or Specialist Appt within 3-5 Days  Not Complete Complete  HRI or Home Care Consult Complete Complete Complete  Social Work Consult for Recovery Care Planning/Counseling Complete Not Complete Complete  Palliative Care Screening Not Applicable Not Complete Complete  Medication Review Oceanographer) Complete Complete Complete

## 2024-01-30 ENCOUNTER — Inpatient Hospital Stay (HOSPITAL_COMMUNITY)

## 2024-01-30 DIAGNOSIS — N185 Chronic kidney disease, stage 5: Secondary | ICD-10-CM | POA: Diagnosis not present

## 2024-01-30 DIAGNOSIS — N179 Acute kidney failure, unspecified: Secondary | ICD-10-CM

## 2024-01-30 DIAGNOSIS — Z7189 Other specified counseling: Secondary | ICD-10-CM | POA: Diagnosis not present

## 2024-01-30 DIAGNOSIS — Z515 Encounter for palliative care: Secondary | ICD-10-CM | POA: Diagnosis not present

## 2024-01-30 LAB — CBC
HCT: 28.9 % — ABNORMAL LOW (ref 36.0–46.0)
Hemoglobin: 9.9 g/dL — ABNORMAL LOW (ref 12.0–15.0)
MCH: 30.9 pg (ref 26.0–34.0)
MCHC: 34.3 g/dL (ref 30.0–36.0)
MCV: 90.3 fL (ref 80.0–100.0)
Platelets: 281 K/uL (ref 150–400)
RBC: 3.2 MIL/uL — ABNORMAL LOW (ref 3.87–5.11)
RDW: 17.2 % — ABNORMAL HIGH (ref 11.5–15.5)
WBC: 9.2 K/uL (ref 4.0–10.5)
nRBC: 0 % (ref 0.0–0.2)

## 2024-01-30 LAB — URINALYSIS, COMPLETE (UACMP) WITH MICROSCOPIC
Bacteria, UA: NONE SEEN
Bilirubin Urine: NEGATIVE
Glucose, UA: 150 mg/dL — AB
Ketones, ur: 5 mg/dL — AB
Nitrite: NEGATIVE
Protein, ur: >=300 mg/dL — AB
Specific Gravity, Urine: 1.022 (ref 1.005–1.030)
WBC, UA: >50 WBC/hpf (ref 0–5)
pH: 5 (ref 5.0–8.0)

## 2024-01-30 LAB — URINALYSIS, W/ REFLEX TO CULTURE (INFECTION SUSPECTED)
Bilirubin Urine: NEGATIVE
Glucose, UA: 150 mg/dL — AB
Ketones, ur: NEGATIVE mg/dL
Nitrite: NEGATIVE
Protein, ur: >=300 mg/dL — AB
Specific Gravity, Urine: 1.022 (ref 1.005–1.030)
WBC, UA: >50 WBC/hpf (ref 0–5)
pH: 5 (ref 5.0–8.0)

## 2024-01-30 LAB — TYPE AND SCREEN
ABO/RH(D): O POS
Antibody Screen: NEGATIVE
Unit division: 0
Unit division: 0

## 2024-01-30 LAB — GLUCOSE, CAPILLARY
Glucose-Capillary: 115 mg/dL — ABNORMAL HIGH (ref 70–99)
Glucose-Capillary: 189 mg/dL — ABNORMAL HIGH (ref 70–99)
Glucose-Capillary: 165 mg/dL — ABNORMAL HIGH (ref 70–99)
Glucose-Capillary: 155 mg/dL — ABNORMAL HIGH (ref 70–99)

## 2024-01-30 LAB — BASIC METABOLIC PANEL WITH GFR
Anion gap: 10 (ref 5–15)
BUN: 55 mg/dL — ABNORMAL HIGH (ref 8–23)
CO2: 18 mmol/L — ABNORMAL LOW (ref 22–32)
Calcium: 8.6 mg/dL — ABNORMAL LOW (ref 8.9–10.3)
Chloride: 113 mmol/L — ABNORMAL HIGH (ref 98–111)
Creatinine, Ser: 5.78 mg/dL — ABNORMAL HIGH (ref 0.44–1.00)
GFR, Estimated: 7 mL/min — ABNORMAL LOW (ref >60)
Glucose, Bld: 123 mg/dL — ABNORMAL HIGH (ref 70–99)
Potassium: 3.5 mmol/L (ref 3.5–5.1)
Sodium: 141 mmol/L (ref 135–145)

## 2024-01-30 LAB — BPAM RBC
Blood Product Expiration Date: 202508162359
Blood Product Expiration Date: 202508172359
ISSUE DATE / TIME: 202507150951
ISSUE DATE / TIME: 202507151358
Unit Type and Rh: 5100
Unit Type and Rh: 5100

## 2024-01-30 LAB — CK: Total CK: 226 U/L (ref 38–234)

## 2024-01-30 LAB — SODIUM, URINE, RANDOM: Sodium, Ur: 48 mmol/L

## 2024-01-30 LAB — PROTEIN / CREATININE RATIO, URINE
Creatinine, Urine: 191 mg/dL
Protein Creatinine Ratio: 4.36 mg/mg{creat} — ABNORMAL HIGH (ref 0.00–0.15)
Total Protein, Urine: 833 mg/dL

## 2024-01-30 NOTE — Progress Notes (Signed)
 [facility] PROGRESS NOTE  Maria Parrish (DOB: 1946-01-20) FMW:[id number] PCP: Margarete Maeola DASEN, FNP  Brief Narrative: 78 year old female with a history of contact impairment, CKD stage V, diabetes mellitus type 2, hypertension, hyperlipidemia, HFpEF (recent admission 5/4-5/7 discharged on torsemide  20mg  BID), COPD, and DNR status who presented to the [facility] 7/14 after her son found her awake and confused on the floor next to her bedside commode and bed, having had emesis, poor oral intake, and generalized weakness for the past 4 days.   In the ED, she was afebrile and hemodynamically stable with oxygen  saturation 98% on room air. Hypokalemia (K 2.3), and AKI (SCr 5.97 from baseline of 4.1-4.4) with metabolic acidosis (bicarb 17) were noted. CT of the brain was negative for acute findings. CT of the cervical spine was negative for any traumatic injuries.  CT of the CAP showed LLL and RML patchy opacities with bilateral trace pleural effusions and old healed sacral insufficiency fractures and old left inferior pubic rami fractures. There was some subcutaneous edema about the left hip. CT of the L-spine showed age-indeterminate L1 fracture with 35% height loss. CT of the thoracic spine was negative for any injury.    The patient was started on IV fluids, doxycycline , and ceftriaxone  in the emergency department.  For her elevated blood pressure, she was given amlodipine , carvedilol , hydralazine  50 mg, and Imdur .  Subjective: Sitting in chair says she's very hungry, eating breakfast. No complaints, denies pain.   Objective: BP (!) 170/83 (BP Location: Left Arm)   Pulse 61   Temp 98.3 F (36.8 C) (Oral)   Resp 17   SpO2 94%   Gen: Elderly female in no distress Pulm: Clear, nonlabored  CV: RRR, no MRG, trace edema GI: Soft, NT, ND, +BS  Neuro: Alert and oriented. No new focal deficits. Ext: Warm, no deformities Skin: No rashes, lesions or ulcers on visualized skin   Assessment & Plan: AKI  on stage V CKD with NAGMA: Baseline Cr 4.1-4.4, has been 5.97 > > 5.78 since arrival this admission.  - Suspected volume depletion for which IV fluids are continued.  - Element of mild rhabdomyolysis though CK has cleared.  - Need to check urinalysis w/micro, renal U/S, urine lytes.  - Continue sodium bicarbonate  tabs.    Lobar pneumonia/aspiration pneumonia - Started cefepime  and continue doxycycline . Leukocytosis resolved, remains afebrile, neg MRSA PCR. Blood cultures NGTD.   Acute metabolic encephalopathy: Secondary to acute on chronic renal failure and infectious process. B12 wnl at 1,260, folate 14.4, TSH 1.211, CT head nonacute.  - Much more alert than prior notes suggest. continue monitoring.   Chronic HFpEF, HTN: 09/29/2023 echo EF 55 to 60%, no WMA, grade 1 DD, normal RVF, mild MR. Volume down initially, will need close monitoring on IVF.  - Hold torsemide  for now.   - Continue BP management with coreg  (monitor bradycardia), hydralazine , imdur  and norvasc . Titrate to BP.    T2DM: HbA1c 6.9%.  - Hold tresiba , giving SSI   Acute on chronic anemia of CKD - Baseline hemoglobin 8, down to 6.5 on 7/15 without bleeding. s/p 2u RBCs with appropriate response hgb 9.9g/dl.   - Iron supp.    COPD: Quiescent.  - Continue bronchodilators    L1 compression fracture - No signs of neurologic compromise on exam - PT evaluation pending   Hypokalemia: Resolved with supplementation.  - Monitor daily.   DNR: POA. Noted.   [name], MD [facility] [email or web address] 01/30/2024, 12:48 PM

## 2024-01-30 NOTE — NC FL2 (Signed)
 [place]  MEDICAID FL2 LEVEL OF CARE FORM     IDENTIFICATION  Patient Name: Maria Parrish Birthdate: [birth year] Sex: female Admission Date (Current Location): 01/28/2024  [facility] and [place] Number:  [employer] and Address:  [facility],  [address] [place] [address]      Provider Number: [id number]  Attending Physician Name and Address:  Bryn Bernardino NOVAK, MD  Relative Name and Phone Number:       Current Level of Care: Hospital Recommended Level of Care: Skilled Nursing Facility Prior Approval Number:    Date Approved/Denied:   PASRR Number: [id number]  Discharge Plan: SNF    Current Diagnoses: Patient Active Problem List   Diagnosis Date Noted   Acute renal failure superimposed on stage 5 chronic kidney disease, not on chronic dialysis (HCC) 01/28/2024   Aspiration pneumonitis (HCC) 01/28/2024   Lobar pneumonia (HCC) 01/28/2024   Chronic heart failure with preserved ejection fraction (HFpEF) (HCC) 01/28/2024   Acute metabolic encephalopathy 01/28/2024   Acute hypoxemic respiratory failure (HCC) 09/28/2023   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) 02/27/2023   Insulin  long-term use (HCC) 02/27/2023   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) 06/15/2022   Statin intolerance 01/03/2022   Peripheral arterial disease (HCC) 09/07/2020   Chronic kidney disease 05/27/2020   Acute on chronic diastolic (congestive) heart failure (HCC) 05/13/2020   Acute on chronic diastolic congestive heart failure (HCC) 05/12/2020   Elevated serum creatinine 05/12/2020   Non compliance with medical treatment 12/17/2017   Hyperparathyroidism (HCC) 10/19/2016   Vitamin D  deficiency 01/12/2016   Altered mental status 09/22/2015   Left hemiparesis (HCC) 09/22/2015   Acute encephalopathy 09/22/2015   Palliative care encounter    UTI (urinary tract infection) 09/14/2015   Dementia (HCC) 09/14/2015   General weakness 09/14/2015    Subacute confusional state 09/14/2015   Type 2 diabetes mellitus without complication, with long-term current use of insulin  (HCC) 09/14/2015   Confusion 09/14/2015   Fall at home    Elevated parathyroid  hormone 09/09/2015   Mucosal abnormality of stomach    Diverticulosis of colon without hemorrhage    Hypercalcemia 08/10/2015   Goiter 08/10/2015   Abnormal weight loss 07/28/2015   Uncontrolled type 2 diabetes mellitus 03/26/2015   Neuropathy 03/26/2015   Hypokalemia 03/26/2015   Essential hypertension, benign 03/26/2015   Mixed hyperlipidemia 03/26/2015   Malnutrition of moderate degree (HCC) 03/26/2015    Orientation RESPIRATION BLADDER Height & Weight     Self, Place  Normal Continent Weight:   Height:     BEHAVIORAL SYMPTOMS/MOOD NEUROLOGICAL BOWEL NUTRITION STATUS      Continent Diet (Carb modified)  AMBULATORY STATUS COMMUNICATION OF NEEDS Skin   Extensive Assist Verbally Normal                       Personal Care Assistance Level of Assistance  Bathing, Feeding, Dressing Bathing Assistance: Limited assistance Feeding assistance: Independent Dressing Assistance: Limited assistance     Functional Limitations Info  Sight, Hearing, Speech Sight Info: Adequate Hearing Info: Adequate Speech Info: Adequate    SPECIAL CARE FACTORS FREQUENCY  PT (By licensed PT), OT (By licensed OT)     PT Frequency: 5 times weekly OT Frequency: 5 times weekly            Contractures Contractures Info: Not present    Additional Factors Info  Code Status, Allergies Code Status Info: DNR-Limited Allergies Info: Statins,  Penicillins           Current Medications (01/30/2024):  This is the current hospital active medication list Current Facility-Administered Medications  Medication Dose Route Frequency Provider Last Rate Last Admin   acetaminophen  (TYLENOL ) tablet 650 mg  650 mg Oral Q6H PRN Tat, Alm, MD       Or   acetaminophen  (TYLENOL ) suppository 650 mg  650  mg Rectal Q6H PRN Tat, Alm, MD       amLODipine  (NORVASC ) tablet 10 mg  10 mg Oral Daily Melvenia Motto, MD   10 mg at 01/30/24 0809   carvedilol  (COREG ) tablet 12.5 mg  12.5 mg Oral BID WC Tat, Alm, MD   12.5 mg at 01/30/24 0809   ceFEPIme  (MAXIPIME ) 1 g in sodium chloride  0.9 % 100 mL IVPB  1 g Intravenous Q24H Evonnie Alm, MD   Stopped at 01/29/24 1839   doxycycline  (VIBRA -TABS) tablet 100 mg  100 mg Oral Q12H Tat, David, MD   100 mg at 01/30/24 9190   ferrous sulfate  tablet 325 mg  325 mg Oral Q breakfast Tat, Alm, MD   325 mg at 01/30/24 0809   heparin  injection 5,000 Units  5,000 Units Subcutaneous Q8H Tat, David, MD   5,000 Units at 01/30/24 9488   hydrALAZINE  (APRESOLINE ) tablet 50 mg  50 mg Oral TID Melvenia Motto, MD   50 mg at 01/30/24 0809   insulin  aspart (novoLOG ) injection 0-5 Units  0-5 Units Subcutaneous QHS Tat, Alm, MD       insulin  aspart (novoLOG ) injection 0-9 Units  0-9 Units Subcutaneous TID WC Evonnie Alm, MD   2 Units at 01/30/24 1215   isosorbide  mononitrate (IMDUR ) 24 hr tablet 30 mg  30 mg Oral Daily Melvenia Motto, MD   30 mg at 01/30/24 0809   ondansetron  (ZOFRAN ) tablet 4 mg  4 mg Oral Q6H PRN Tat, Alm, MD       Or   ondansetron  (ZOFRAN ) injection 4 mg  4 mg Intravenous Q6H PRN Tat, Alm, MD       pantoprazole  (PROTONIX ) EC tablet 40 mg  40 mg Oral QAC breakfast Tat, Alm, MD   40 mg at 01/30/24 0809   sodium bicarbonate  tablet 650 mg  650 mg Oral BID Evonnie Alm, MD   650 mg at 01/30/24 9190     Discharge Medications: Please see discharge summary for a list of discharge medications.  Relevant Imaging Results:  Relevant Lab Results:   Additional Information SSN: [id number] LCSWA

## 2024-01-30 NOTE — TOC Progression Note (Signed)
 Transition of Care [facility]) - Progression Note    Patient Details  Name: Maria Parrish MRN: [id number] Date of Birth: March 15, 1946  [facility]) CM/SW Contact  Lucie Lunger, [place] Phone Number: 01/30/2024, 3:52 PM  Clinical Narrative:    CSW updated that PT is recommending SNF for pt at D/C. CSW attempted to reach pts son x2, secure text sent as VM box is full. CSW also reached out to pts other secondary contact, secure text sent requesting call back. Pt is only oriented to person and place at this time. CSW to send out referral for SNF locally as to not place a delay in discharge. CSW to follow up with pts sons if able for decision on SNF vs home with [facility] services. TOC to follow.   Expected Discharge Plan: Home w Home Health Services Barriers to Discharge: Continued Medical Work up  Expected Discharge Plan and Services In-house Referral: Clinical Social Work Discharge Planning Services: CM Consult   Living arrangements for the past 2 months: Single Family Home                                       Social Determinants of Health (SDOH) Interventions SDOH Screenings   Food Insecurity: No Food Insecurity (01/28/2024)  Housing: Low Risk  (01/28/2024)  Transportation Needs: No Transportation Needs (01/28/2024)  Utilities: Not At Risk (01/28/2024)  Depression (PHQ2-9): Low Risk  (09/14/2023)  Physical Activity: Inactive (08/20/2020)  Social Connections: Moderately Integrated (01/28/2024)  Tobacco Use: Low Risk  (01/29/2024)    Readmission Risk Interventions    01/29/2024   12:50 PM 11/19/2023    3:13 PM 09/29/2023    4:26 PM  Readmission Risk Prevention Plan  Transportation Screening Complete Complete Complete  PCP or Specialist Appt within 3-5 Days  Not Complete Complete  HRI or Home Care Consult Complete Complete Complete  Social Work Consult for Recovery Care Planning/Counseling Complete Not Complete Complete  Palliative Care Screening Not Applicable Not Complete  Complete  Medication Review Oceanographer) Complete Complete Complete

## 2024-01-30 NOTE — Plan of Care (Signed)
  Problem: Acute Rehab PT Goals(only PT should resolve) Goal: Pt Will Go Supine/Side To Sit Outcome: Progressing Flowsheets (Taken 01/30/2024 1513) Pt will go Supine/Side to Sit:  with supervision  with modified independence Goal: Patient Will Transfer Sit To/From Stand Outcome: Progressing Flowsheets (Taken 01/30/2024 1513) Patient will transfer sit to/from stand:  with minimal assist  with contact guard assist Goal: Pt Will Transfer Bed To Chair/Chair To Bed Outcome: Progressing Flowsheets (Taken 01/30/2024 1513) Pt will Transfer Bed to Chair/Chair to Bed:  with contact guard assist  with min assist Goal: Pt Will Ambulate Outcome: Progressing Flowsheets (Taken 01/30/2024 1513) Pt will Ambulate:  25 feet  with minimal assist  with rolling walker   3:13 PM, 01/30/24 Lynwood Music, MPT Physical Therapist with [facility] [phone] office [phone] mobile phone

## 2024-01-30 NOTE — Evaluation (Signed)
 Physical Therapy Evaluation Patient Details Name: Maria Parrish MRN: [id number] DOB: Oct 20, 1945 Today's Date: 01/30/2024  History of Present Illness  Maria Parrish is a 78 year old female with a history of contact impairment, CKD stage V, diabetes mellitus type 2, hypertension, hyperlipidemia, dCHF, and COPD presenting with altered mental status.  History is supplemented by the patient's son at the bedside.  The patient's son states that he was going to get the patient ready to go get blood work done when he found the patient on the floor next to her bedside commode and bed.  He stated that she was awake but confused.  The son states that the patient has not been feeling well since last Thursday, 01/24/2024.  On that day, the patient had a couple episodes of emesis and loose stools.  Since then, the patient has had decreased oral intake and generalized weakness.  There were no complaints of fevers, chills, chest pain, shortness breath, abdominal pain, headache.  Because the patient has had decreased oral intake and vomiting, the patient's son made the decision not to give her her usual medications over the last couple days.  He states that he held the patient's medications starting 01/25/2024.  At baseline, the patient is able to ambulate and make transfers with a walker.  She is conversant but is unaware of the date at baseline.  Presently, the patient denies any headache, chest pain, shortness of breath, coughing, hemoptysis, vomiting, abdominal pain, diarrhea.   Clinical Impression  Patient presents in chair (assisted by nursing staff) and agreeable for therapy.  Patient very unsteady on feet and limited to a few shuffling steps at bedside before having to sit due to BLE weakness, fall risk, fair/good return demonstrated for completing supine<>sitting in bed and requested to stay in bed due fatigue. Patient will benefit from continued skilled physical therapy in hospital and recommended venue below to  increase strength, balance, endurance for safe ADLs and gait.         If plan is discharge home, recommend the following: A lot of help with bathing/dressing/bathroom;A lot of help with walking and/or transfers;Help with stairs or ramp for entrance;Assistance with cooking/housework;Assist for transportation   Can travel by private vehicle   No    Equipment Recommendations None recommended by PT  Recommendations for Other Services       Functional Status Assessment Patient has had a recent decline in their functional status and demonstrates the ability to make significant improvements in function in a reasonable and predictable amount of time.     Precautions / Restrictions Precautions Precautions: Fall Recall of Precautions/Restrictions: Impaired Restrictions Weight Bearing Restrictions Per Provider Order: No      Mobility  Bed Mobility Overal bed mobility: Needs Assistance Bed Mobility: Supine to Sit, Sit to Supine     Supine to sit: Contact guard, Min assist Sit to supine: Supervision   General bed mobility comments: had mild difficulty sitting up at bedside    Transfers Overall transfer level: Needs assistance Equipment used: Rolling walker (2 wheels) Transfers: Sit to/from Stand, Bed to chair/wheelchair/BSC Sit to Stand: Min assist, Mod assist   Step pivot transfers: Min assist, Mod assist       General transfer comment: unsteady labored movement    Ambulation/Gait Ambulation/Gait assistance: Mod assist Gait Distance (Feet): 4 Feet Assistive device: Rolling walker (2 wheels) Gait Pattern/deviations: Decreased step length - right, Decreased step length - left, Decreased stride length, Shuffle, Knees buckling Gait velocity: slow  General Gait Details: limited to a few slow labored side steps with buckling of knees due to weakness  Stairs            Wheelchair Mobility     Tilt Bed    Modified Rankin (Stroke Patients Only)        Balance Overall balance assessment: Needs assistance Sitting-balance support: Feet supported, No upper extremity supported Sitting balance-Leahy Scale: Fair Sitting balance - Comments: fair/good seated at EOB   Standing balance support: Reliant on assistive device for balance, During functional activity, Bilateral upper extremity supported Standing balance-Leahy Scale: Poor Standing balance comment: using RW                             Pertinent Vitals/Pain Pain Assessment Pain Assessment: No/denies pain    Home Living Family/patient expects to be discharged to:: Private residence Living Arrangements: Children Available Help at Discharge: Family;Available PRN/intermittently Type of Home: House Home Access: Ramped entrance       Home Layout: One level Home Equipment: Agricultural consultant (2 wheels);Cane - single point;Wheelchair Financial trader (4 wheels);Hospital bed      Prior Function Prior Level of Function : Independent/Modified Independent             Mobility Comments: household ambulation using RW ADLs Comments: independent with ADLs.     Extremity/Trunk Assessment   Upper Extremity Assessment Upper Extremity Assessment: Overall WFL for tasks assessed;Generalized weakness    Lower Extremity Assessment Lower Extremity Assessment: Generalized weakness    Cervical / Trunk Assessment Cervical / Trunk Assessment: Normal  Communication   Communication Communication: No apparent difficulties    Cognition       PT - Cognitive impairments: No family/caregiver present to determine baseline, Memory                       PT - Cognition Comments: requireds occasionla repeated verbal/tactile cueing for following directions Following commands: Impaired Following commands impaired: Follows one step commands with increased time     Cueing Cueing Techniques: Verbal cues, Tactile cues     General Comments      Exercises      Assessment/Plan    PT Assessment Patient needs continued PT services  PT Problem List Decreased strength;Decreased activity tolerance;Decreased balance;Decreased mobility       PT Treatment Interventions DME instruction;Gait training;Stair training;Functional mobility training;Therapeutic activities;Therapeutic exercise;Balance training;Patient/family education    PT Goals (Current goals can be found in the Care Plan section)  Acute Rehab PT Goals Patient Stated Goal: return home with family to assist PT Goal Formulation: With patient Time For Goal Achievement: 02/13/24 Potential to Achieve Goals: Good    Frequency Min 3X/week     Co-evaluation               AM-PAC PT 6 Clicks Mobility  Outcome Measure Help needed turning from your back to your side while in a flat bed without using bedrails?: None Help needed moving from lying on your back to sitting on the side of a flat bed without using bedrails?: A Little Help needed moving to and from a bed to a chair (including a wheelchair)?: A Lot Help needed standing up from a chair using your arms (e.g., wheelchair or bedside chair)?: A Lot Help needed to walk in hospital room?: A Lot Help needed climbing 3-5 steps with a railing? : A Lot 6 Click Score: 15    End  of Session   Activity Tolerance: Patient tolerated treatment well Patient left: in bed;with call bell/phone within reach Nurse Communication: Mobility status PT Visit Diagnosis: Unsteadiness on feet (R26.81);Other abnormalities of gait and mobility (R26.89);Muscle weakness (generalized) (M62.81)    Time: 1430-1450 PT Time Calculation (min) (ACUTE ONLY): 20 min   Charges:   PT Evaluation $PT Eval Moderate Complexity: 1 Mod PT Treatments $Therapeutic Activity: 8-22 mins PT General Charges $$ ACUTE PT VISIT: 1 Visit         3:12 PM, 01/30/24 Lynwood Music, MPT Physical Therapist with [facility] [phone] office [phone] mobile  phone

## 2024-01-30 NOTE — Progress Notes (Signed)
 Palliative:  Maria Parrish is sitting up in the [place] chair in her room.  She appears acutely/chronically ill and frail.  She is resting comfortably, but greets me making and somewhat keeping eye contact.  She is oriented to self and place today, but not month.  Overall, Maria Parrish's creatinine remains elevated.  She continues to to be confused.  Call to son, Maria Parrish.  No answer, unable to leave voicemail message.  Call to son, Maria Parrish.  Left voicemail message.  Conference with attending, bedside nursing staff, transition of care team related to patient condition, needs, goals of care, disposition.  Plan:   Continue to treat the treatable but no CPR or intubation.  Time for outcomes.  PMT to follow.  35 minutes  Lorenza Birkenhead, NP Palliative Medicine Team  Team Phone [phone]

## 2024-01-31 DIAGNOSIS — N179 Acute kidney failure, unspecified: Secondary | ICD-10-CM | POA: Diagnosis not present

## 2024-01-31 DIAGNOSIS — Z515 Encounter for palliative care: Secondary | ICD-10-CM

## 2024-01-31 DIAGNOSIS — N185 Chronic kidney disease, stage 5: Secondary | ICD-10-CM | POA: Diagnosis not present

## 2024-01-31 DIAGNOSIS — Z7189 Other specified counseling: Secondary | ICD-10-CM | POA: Diagnosis not present

## 2024-01-31 LAB — GLUCOSE, CAPILLARY
Glucose-Capillary: 127 mg/dL — ABNORMAL HIGH (ref 70–99)
Glucose-Capillary: 173 mg/dL — ABNORMAL HIGH (ref 70–99)
Glucose-Capillary: 152 mg/dL — ABNORMAL HIGH (ref 70–99)
Glucose-Capillary: 144 mg/dL — ABNORMAL HIGH (ref 70–99)

## 2024-01-31 LAB — RENAL FUNCTION PANEL
Albumin: 2.7 g/dL — ABNORMAL LOW (ref 3.5–5.0)
Anion gap: 11 (ref 5–15)
BUN: 58 mg/dL — ABNORMAL HIGH (ref 8–23)
CO2: 17 mmol/L — ABNORMAL LOW (ref 22–32)
Calcium: 9 mg/dL (ref 8.9–10.3)
Chloride: 115 mmol/L — ABNORMAL HIGH (ref 98–111)
Creatinine, Ser: 6.14 mg/dL — ABNORMAL HIGH (ref 0.44–1.00)
GFR, Estimated: 7 mL/min — ABNORMAL LOW (ref >60)
Glucose, Bld: 130 mg/dL — ABNORMAL HIGH (ref 70–99)
Phosphorus: 4.2 mg/dL (ref 2.5–4.6)
Potassium: 3.5 mmol/L (ref 3.5–5.1)
Sodium: 143 mmol/L (ref 135–145)

## 2024-01-31 LAB — URINE CULTURE: Culture: <10000 — AB

## 2024-01-31 MED ORDER — SODIUM CHLORIDE 0.9 % IV SOLN: INTRAVENOUS | Status: AC | PRN

## 2024-01-31 NOTE — TOC Progression Note (Signed)
 Transition of Care [facility]) - Progression Note    Patient Details  Name: DOMINO HOLTEN MRN: [id number] Date of Birth: 1946-04-17  Transition of Care [facility] CM/SW Contact  Lucie Lunger, [place] Phone Number: 01/31/2024, 10:02 AM  Clinical Narrative:    CSW continues to attempt to reach pts son Cordella, unable to leave VM because VM box is full. CSW reached out to pts work number and was informed he is not in today. CSW to continue trying to attempt to reach pt. TOC to follow.   Expected Discharge Plan: Home w Home Health Services Barriers to Discharge: Continued Medical Work up  Expected Discharge Plan and Services In-house Referral: Clinical Social Work Discharge Planning Services: CM Consult   Living arrangements for the past 2 months: Single Family Home                                       Social Determinants of Health (SDOH) Interventions SDOH Screenings   Food Insecurity: No Food Insecurity (01/28/2024)  Housing: Low Risk  (01/28/2024)  Transportation Needs: No Transportation Needs (01/28/2024)  Utilities: Not At Risk (01/28/2024)  Depression (PHQ2-9): Low Risk  (09/14/2023)  Physical Activity: Inactive (08/20/2020)  Social Connections: Moderately Integrated (01/28/2024)  Tobacco Use: Low Risk  (01/29/2024)    Readmission Risk Interventions    01/29/2024   12:50 PM 11/19/2023    3:13 PM 09/29/2023    4:26 PM  Readmission Risk Prevention Plan  Transportation Screening Complete Complete Complete  PCP or Specialist Appt within 3-5 Days  Not Complete Complete  HRI or Home Care Consult Complete Complete Complete  Social Work Consult for Recovery Care Planning/Counseling Complete Not Complete Complete  Palliative Care Screening Not Applicable Not Complete Complete  Medication Review Oceanographer) Complete Complete Complete

## 2024-01-31 NOTE — Progress Notes (Signed)
 Pt lost IV access. Attempted X2 to start new IV with no success. MD aware.

## 2024-01-31 NOTE — Progress Notes (Signed)
 [facility] PROGRESS NOTE  Maria Parrish (DOB: [birth year]) FMW:[id number] PCP: Margarete Maeola DASEN, FNP  Brief Narrative: 78 year old female with a history of contact impairment, CKD stage V, diabetes mellitus type 2, hypertension, hyperlipidemia, HFpEF (recent admission 5/4-5/7 discharged on torsemide  20mg  BID), COPD, and DNR status who presented to [facility] 7/14 after her son found her awake and confused on the floor next to her bedside commode and bed, having had emesis, poor oral intake, and generalized weakness for the past 4 days.   In the ED, she was afebrile and hemodynamically stable with oxygen  saturation 98% on room air. Hypokalemia (K 2.3), and AKI (SCr 5.97 from baseline of 4.1-4.4) with metabolic acidosis (bicarb 17) were noted. CT of the brain was negative for acute findings. CT of the cervical spine was negative for any traumatic injuries.  CT of the CAP showed LLL and RML patchy opacities with bilateral trace pleural effusions and old healed sacral insufficiency fractures and old left inferior pubic rami fractures. There was some subcutaneous edema about the left hip. CT of the L-spine showed age-indeterminate L1 fracture with 35% height loss. CT of the thoracic spine was negative for any injury.    The patient was started on IV fluids, doxycycline , and ceftriaxone  in the emergency department.  For her elevated blood pressure, she was given amlodipine , carvedilol , hydralazine  50 mg, and Imdur .  Subjective: Taking AM medications without issues, eating ok, no complaints.   Objective: BP 136/86 (BP Location: Left Arm)   Pulse 67   Temp 98.5 F (36.9 C) (Oral)   Resp 17   SpO2 97%   Gen: Elderly, pleasant female in no distress Pulm: Clear, nonlabored  CV: RRR, no edema GI: Soft, NT, ND, +BS  Neuro: Alert and incompletely oriented. No new focal deficits. Ext: Warm, no deformities Skin: No rashes, lesions or ulcers on visualized skin   Assessment & Plan: AKI on stage V CKD  with NAGMA: Baseline Cr 4.1-4.4, has been 5.97 > > 6.14 since arrival this admission.  - Suspected volume depletion, holding diuretic. Taking po so will stop IVF. - Element of mild rhabdomyolysis though CK has cleared.  - UA without casts, +pyuria and nephrotic proteinuria (spot UPr:Cr 4.36).  - Continue sodium bicarbonate  tabs.  - Nephrology consulted, no change to above management recommended. Appreciate their assistance.    Lobar pneumonia/aspiration pneumonia - Started cefepime  and continue doxycycline . Leukocytosis resolved, remains afebrile, neg MRSA PCR. Blood cultures NGTD. Plan to complete 5 days Tx.    Acute metabolic encephalopathy: Secondary to acute on chronic renal failure and infectious process. B12 wnl at 1,260, folate 14.4, TSH 1.211, CT head nonacute.  - Much more alert than prior notes suggest. continue monitoring. - Attempting to reach family for continued discussions Re: disposition and goals of care.    Chronic HFpEF, HTN: 09/29/2023 echo EF 55 to 60%, no WMA, grade 1 DD, normal RVF, mild MR. Volume down initially, will need close monitoring on IVF.  - Hold torsemide  for now.   - Continue BP management with coreg  (monitor bradycardia), hydralazine , imdur  and norvasc . Titrate to BP.    T2DM: HbA1c 6.9%.  - Hold tresiba , giving SSI   Acute on chronic anemia of CKD - Baseline hemoglobin 8, down to 6.5 on 7/15 without bleeding. s/p 2u RBCs with appropriate response hgb 9.9g/dl.   - Iron supp.    COPD: Quiescent.  - Continue bronchodilators    L1 compression fracture - No signs of neurologic compromise on exam -  PT evaluation pending   Hypokalemia: Resolved with supplementation.  - Monitor daily.   DNR: POA. Noted.   [name], MD [facility] [email or web address] 01/31/2024, 9:51 AM

## 2024-01-31 NOTE — Consult Note (Signed)
 Nephrology Consult   Requesting provider: Bernardino Come Service requesting consult: Hospitalist Reason for consult: AKI on CKD V   Assessment/Recommendations: Maria Parrish is a/an 78 y.o. female with a past medical history past medical history of cognitive impairment, CKD 5, DM 2, HTN, HLD, CHF, COPD who presents with pneumonia complicated by altered mental status and AKI on CKD 5  AKI on CKD 5: Baseline creatinine around 4.  Creatinine now 5.5.  Likely some tubular injury associated with her acute illness and likely dehydration at home.  Care is supportive at this time.  She has expressed in the past that she does not want dialysis.  This is very reasonable as with her cognitive impairment she is not a very good dialysis candidate.  Her creatinine may improve with some time but hard to say definitively given her extensive baseline CKD.  I do not have anything to add to her current care and will sign off at this time.  Pneumonia: Antibiotics per primary team  AMS/baseline cognitive impairment: Seems like significant dementia at baseline.  AMS likely associated with AKI, possibly mild uremia, acute illness with pneumonia.  Supportive care per primary team  CHF: Agree with holding diuretics for now.  Could start back once the patient has signs of volume overload  Hypertension: Continue medications per primary team  Metabolic acidosis: Bicarb 17.  Ideally 18-22 but near goal.  No need to increase dose unless bicarb consistently less than 16.  Do not want to create a significant pill burden  Goals of care: Continue conversations with palliative care.  An approach focused on comfort seems most reasonable   Recommendations conveyed to primary service.    Jayson JINNY Player  [facility] 01/31/2024 8:59 AM   _____________________________________________________________________________________ CC: Altered mental status  History of Present Illness: Maria Parrish is a/an 78 y.o.  female with a past medical history of cognitive impairment, CKD 5, DM 2, HTN, HLD, CHF, COPD who presents with altered mental status.  Because of the patient's altered mental status, that may be her baseline cognitive impairment, history was largely obtained per chart review.  No family at bedside.  Patient was admitted to the hospital 3 days ago for altered mental status.  The patient's son said that he found the patient on the floor next to her bedside commode at home.  She was awake but more confused than normal.  He noted that she had not been feeling well for several days prior to this she also had several episodes of emesis and loose stools with decreased oral intake and generalized weakness.  No obvious fevers that they were aware of her chest pain or shortness of breath.  In the emergency department the patient had reassuring vital signs and labs were consistent with advanced CKD.  Brain imaging without obvious concern.  CT of the chest demonstrated signs concerning for pneumonia.  Patient was admitted for further management.  Patient is creatinine at baseline is around 4.  In the past she is indicated she would not want dialysis when the time comes.  This has been reexpressed with palliative care on this admission.  Creatinine higher than baseline this admission around 6.   Medications:  Current Facility-Administered Medications  Medication Dose Route Frequency Provider Last Rate Last Admin   acetaminophen  (TYLENOL ) tablet 650 mg  650 mg Oral Q6H PRN Tat, David, MD       Or   acetaminophen  (TYLENOL ) suppository 650 mg  650 mg Rectal Q6H PRN Tat,  Alm, MD       amLODipine  (NORVASC ) tablet 10 mg  10 mg Oral Daily Melvenia Motto, MD   10 mg at 01/31/24 0841   carvedilol  (COREG ) tablet 12.5 mg  12.5 mg Oral BID WC Tat, Alm, MD   12.5 mg at 01/31/24 9157   ceFEPIme  (MAXIPIME ) 1 g in sodium chloride  0.9 % 100 mL IVPB  1 g Intravenous Q24H Evonnie Alm, MD 200 mL/hr at 01/30/24 1626 1 g at  01/30/24 1626   doxycycline  (VIBRA -TABS) tablet 100 mg  100 mg Oral Q12H Tat, David, MD   100 mg at 01/31/24 9156   ferrous sulfate  tablet 325 mg  325 mg Oral Q breakfast Tat, Alm, MD   325 mg at 01/31/24 9156   heparin  injection 5,000 Units  5,000 Units Subcutaneous Q8H Tat, David, MD   5,000 Units at 01/31/24 9462   hydrALAZINE  (APRESOLINE ) tablet 50 mg  50 mg Oral TID Melvenia Motto, MD   50 mg at 01/31/24 9156   insulin  aspart (novoLOG ) injection 0-5 Units  0-5 Units Subcutaneous QHS Evonnie Alm, MD       insulin  aspart (novoLOG ) injection 0-9 Units  0-9 Units Subcutaneous TID WC Evonnie Alm, MD   2 Units at 01/30/24 1717   isosorbide  mononitrate (IMDUR ) 24 hr tablet 30 mg  30 mg Oral Daily Melvenia Motto, MD   30 mg at 01/31/24 9157   ondansetron  (ZOFRAN ) tablet 4 mg  4 mg Oral Q6H PRN Tat, Alm, MD       Or   ondansetron  (ZOFRAN ) injection 4 mg  4 mg Intravenous Q6H PRN Tat, Alm, MD       pantoprazole  (PROTONIX ) EC tablet 40 mg  40 mg Oral QAC breakfast Tat, Alm, MD   40 mg at 01/31/24 9157   sodium bicarbonate  tablet 650 mg  650 mg Oral BID Evonnie Alm, MD   650 mg at 01/31/24 0841     ALLERGIES Statins and Penicillins  MEDICAL HISTORY Past Medical History:  Diagnosis Date   Assistance needed for mobility    walks with walker   Confusion    Dementia (HCC)    Diabetes mellitus without complication (HCC)    Generalized weakness    Heart failure with improved ejection fraction (HFimpEF) (HCC)    a. EF 40-45% by echo in 04/2020 b. normalized to 55-60% by echo in 06/2021   Hypercalcemia    Hyperlipidemia    Hyperparathyroidism (HCC)    Hypertension    Hypertension    Moderate protein-calorie malnutrition (HCC)    Peripheral neuropathy    UTI (lower urinary tract infection)      SOCIAL HISTORY Social History   Socioeconomic History   Marital status: Widowed    Spouse name: Not on file   Number of children: 2   Years of education: Not on file   Highest education level:  Not on file  Occupational History   Occupation: fabricator for [employer]  Tobacco Use   Smoking status: Never   Smokeless tobacco: Never  Vaping Use   Vaping status: Never Used  Substance and Sexual Activity   Alcohol use: No   Drug use: No   Sexual activity: Not Currently  Other Topics Concern   Not on file  Social History Narrative   Not on file   Social Drivers of Health   Financial Resource Strain: Not on file  Food Insecurity: No Food Insecurity (01/28/2024)   Hunger Vital Sign    Worried About Running  Out of Food in the Last Year: Never true    Ran Out of Food in the Last Year: Never true  Transportation Needs: No Transportation Needs (01/28/2024)   PRAPARE - Administrator, Civil Service (Medical): No    Lack of Transportation (Non-Medical): No  Physical Activity: Inactive (08/20/2020)   Exercise Vital Sign    Days of Exercise per Week: 0 days    Minutes of Exercise per Session: 0 min  Stress: Not on file  Social Connections: Moderately Integrated (01/28/2024)   Social Connection and Isolation Panel    Frequency of Communication with Friends and Family: More than three times a week    Frequency of Social Gatherings with Friends and Family: Once a week    Attends Religious Services: 1 to 4 times per year    Active Member of [employer] or Organizations: Yes    Attends Banker Meetings: 1 to 4 times per year    Marital Status: Widowed  Intimate Partner Violence: Not At Risk (01/28/2024)   Humiliation, Afraid, Rape, and Kick questionnaire    Fear of Current or Ex-Partner: No    Emotionally Abused: No    Physically Abused: No    Sexually Abused: No     FAMILY HISTORY Family History  Problem Relation Age of Onset   Heart attack Mother    Diabetes Father    Hypertension Brother    Hypertension Brother    Diabetes Brother    Colon cancer Neg Hx       Review of Systems: 12 systems reviewed Otherwise as per HPI, all other systems reviewed and  negative  Physical Exam: Vitals:   01/30/24 1956 01/31/24 0344  BP: 131/65 136/86  Pulse: (!) 59 67  Resp:    Temp: 97.8 F (36.6 C) 98.5 F (36.9 C)  SpO2: 99% 97%   No intake/output data recorded.  Intake/Output Summary (Last 24 hours) at 01/31/2024 0859 Last data filed at 01/31/2024 0349 Gross per 24 hour  Intake 480 ml  Output 200 ml  Net 280 ml   General: well-appearing, no acute distress HEENT: anicteric sclera, oropharynx clear without lesions CV: normal rate, no rub, no peripheral edema Lungs: clear to auscultation bilaterally, normal work of breathing Abd: soft, non-tender, non-distended Skin: no visible lesions or rashes Psych: alert, engaged, appropriate mood and affect Musculoskeletal: no obvious deformities Neuro: normal speech, oriented person but not place, time, situation  Test Results Reviewed Lab Results  Component Value Date   NA 143 01/31/2024   K 3.5 01/31/2024   CL 115 (H) 01/31/2024   CO2 17 (L) 01/31/2024   BUN 58 (H) 01/31/2024   CREATININE 6.14 (H) 01/31/2024   CALCIUM  9.0 01/31/2024   ALBUMIN 2.7 (L) 01/31/2024   PHOS 4.2 01/31/2024    CBC Recent Labs  Lab 01/28/24 1227 01/29/24 0439 01/29/24 0717 01/29/24 1746 01/30/24 0447  WBC 11.3* 10.4  --   --  9.2  NEUTROABS 9.9*  --   --   --   --   HGB 8.5* 6.5* 6.5* 10.1* 9.9*  HCT 23.9* 19.8* 19.7* 29.9* 28.9*  MCV 85.4 86.8  --   --  90.3  PLT 396 289  --   --  281    I have reviewed all relevant outside healthcare records related to the patient's current hospitalization

## 2024-01-31 NOTE — Plan of Care (Signed)
   Problem: Education: Goal: Knowledge of General Education information will improve Description: Including pain rating scale, medication(s)/side effects and non-pharmacologic comfort measures Outcome: Not Progressing

## 2024-01-31 NOTE — Plan of Care (Signed)

## 2024-02-01 DIAGNOSIS — N185 Chronic kidney disease, stage 5: Secondary | ICD-10-CM | POA: Diagnosis not present

## 2024-02-01 DIAGNOSIS — N179 Acute kidney failure, unspecified: Secondary | ICD-10-CM | POA: Diagnosis not present

## 2024-02-01 DIAGNOSIS — Z7189 Other specified counseling: Secondary | ICD-10-CM | POA: Diagnosis not present

## 2024-02-01 LAB — GLUCOSE, CAPILLARY
Glucose-Capillary: 123 mg/dL — ABNORMAL HIGH (ref 70–99)
Glucose-Capillary: 163 mg/dL — ABNORMAL HIGH (ref 70–99)
Glucose-Capillary: 108 mg/dL — ABNORMAL HIGH (ref 70–99)
Glucose-Capillary: 153 mg/dL — ABNORMAL HIGH (ref 70–99)

## 2024-02-01 LAB — RENAL FUNCTION PANEL
Albumin: 2.8 g/dL — ABNORMAL LOW (ref 3.5–5.0)
Anion gap: 13 (ref 5–15)
BUN: 62 mg/dL — ABNORMAL HIGH (ref 8–23)
CO2: 15 mmol/L — ABNORMAL LOW (ref 22–32)
Calcium: 8.9 mg/dL (ref 8.9–10.3)
Chloride: 113 mmol/L — ABNORMAL HIGH (ref 98–111)
Creatinine, Ser: 6.3 mg/dL — ABNORMAL HIGH (ref 0.44–1.00)
GFR, Estimated: 6 mL/min — ABNORMAL LOW (ref >60)
Glucose, Bld: 117 mg/dL — ABNORMAL HIGH (ref 70–99)
Phosphorus: 4.7 mg/dL — ABNORMAL HIGH (ref 2.5–4.6)
Potassium: 3.6 mmol/L (ref 3.5–5.1)
Sodium: 141 mmol/L (ref 135–145)

## 2024-02-01 MED ORDER — SODIUM BICARBONATE 650 MG PO TABS
1300.0000 mg | ORAL_TABLET | Freq: Three times a day (TID) | ORAL | Status: DC
  Administered 2024-02-01 – 2024-02-02 (×4): 1300 mg via ORAL
  Filled 2024-02-01 (×4): qty 2

## 2024-02-01 MED ORDER — CEFUROXIME AXETIL 250 MG PO TABS
250.0000 mg | ORAL_TABLET | Freq: Every day | ORAL | Status: AC
  Administered 2024-02-01 – 2024-02-02 (×2): 250 mg via ORAL
  Filled 2024-02-01 (×2): qty 1

## 2024-02-01 MED ORDER — CEFPODOXIME PROXETIL 100 MG/5ML PO SUSR: 200.0000 mg | Freq: Every day | ORAL | Status: DC

## 2024-02-01 NOTE — TOC Progression Note (Signed)
 Transition of Care [facility]) - Progression Note    Patient Details  Name: Maria Parrish MRN: [id number] Date of Birth: Sep 05, 1945  Transition of Care [facility]) CM/SW Contact  Hoy DELENA Bigness, LCSW Phone Number: 02/01/2024, 10:02 AM  Clinical Narrative:    Reached pt's son, Cordella by calling his work. Reviewed bed offers for SNF placement. Pt's son accepted bed offer for [facility]. Auth to be requested today.  CSW discussed recommendation for outpatient palliative care/hospice. Pt's son agreeable to this recommendation and agreeable to services through [facility]. Referral has been made to Samantha at [facility].   [facility] [address] [place], [place] [address] [phone] Overall rating ?  [facility] and [facility] [address] [place], [place] [address] [phone] Overall rating ?????  [facility] and [facility] [address] [place], [place] [address] [phone] Overall rating ?   Expected Discharge Plan: Home w Home Health Services Barriers to Discharge: Continued Medical Work up  Expected Discharge Plan and Services In-house Referral: Clinical Social Work Discharge Planning Services: CM Consult   Living arrangements for the past 2 months: Single Family Home                                       Social Determinants of Health (SDOH) Interventions SDOH Screenings   Food Insecurity: No Food Insecurity (01/28/2024)  Housing: Low Risk  (01/28/2024)  Transportation Needs: No Transportation Needs (01/28/2024)  Utilities: Not At Risk (01/28/2024)  Depression (PHQ2-9): Low Risk  (09/14/2023)  Physical Activity: Inactive (08/20/2020)  Social Connections: Moderately Integrated (01/28/2024)  Tobacco Use: Low Risk  (01/29/2024)    Readmission Risk Interventions    01/29/2024   12:50 PM 11/19/2023    3:13 PM 09/29/2023    4:26 PM  Readmission Risk Prevention Plan  Transportation  Screening Complete Complete Complete  PCP or Specialist Appt within 3-5 Days  Not Complete Complete  HRI or Home Care Consult Complete Complete Complete  Social Work Consult for Recovery Care Planning/Counseling Complete Not Complete Complete  Palliative Care Screening Not Applicable Not Complete Complete  Medication Review Oceanographer) Complete Complete Complete

## 2024-02-01 NOTE — Progress Notes (Signed)
 [facility] PROGRESS NOTE  Maria Parrish (DOB: [birth year]) FMW:[id number] PCP: Margarete Maeola DASEN, FNP  Brief Narrative: 78 year old female with a history of contact impairment, CKD stage V, diabetes mellitus type 2, hypertension, hyperlipidemia, HFpEF (recent admission 5/4-5/7 discharged on torsemide  20mg  BID), COPD, and DNR status who presented to the [facility] 7/14 after her son found her awake and confused on the floor next to her bedside commode and bed, having had emesis, poor oral intake, and generalized weakness for the past 4 days.   In the ED, she was afebrile and hemodynamically stable with oxygen  saturation 98% on room air. Hypokalemia (K 2.3), and AKI (SCr 5.97 from baseline of 4.1-4.4) with metabolic acidosis (bicarb 17) were noted. CT of the brain was negative for acute findings. CT of the cervical spine was negative for any traumatic injuries.  CT of the CAP showed LLL and RML patchy opacities with bilateral trace pleural effusions and old healed sacral insufficiency fractures and old left inferior pubic rami fractures. There was some subcutaneous edema about the left hip. CT of the L-spine showed age-indeterminate L1 fracture with 35% height loss. CT of the thoracic spine was negative for any injury.    The patient was started on IV fluids, doxycycline , and ceftriaxone  in the emergency department.  For her elevated blood pressure, she was given amlodipine , carvedilol , hydralazine  50 mg, and Imdur .  Subjective: Ate full breakfast, has no pain and no other complaints. Lost IV overnight, unable to replace after a couple attempts.   Objective: BP (!) 171/88 (BP Location: Left Arm)   Pulse 65   Temp (!) 97.5 F (36.4 C) (Axillary)   Resp 18   SpO2 92%   Gen: Elderly female in no distress Pulm: Tachypneic but nonlabored, clear  CV: RRR, no MRG or pitting edema  GI: Soft, NT, ND, +BS  Neuro: Alert and pleasantly disoriented. No new focal deficits. Ext: Warm, no deformities Skin: No  rashes, lesions or ulcers on visualized skin   Assessment & Plan: AKI on stage V CKD with NAGMA: Baseline Cr 4.1-4.4, has been 5.97 > > 6.14 since arrival this admission.  - Suspected volume depletion, holding diuretic. Taking po so holding IVF. Appears euvolemic. Cr still worsening slowly, will not pursue HD per pt/family goals of care, so we may enroll in hospice.  - Element of mild rhabdomyolysis though CK has cleared.  - UA without casts, +pyuria and nephrotic proteinuria (spot UPr:Cr 4.36).  - NAGMA worsening, compensatory tachypnea noted, increase bicarb tabs.  - Nephrology consulted, no change to above management recommended. Appreciate their assistance.    Lobar pneumonia/aspiration pneumonia - Started cefepime  and continue doxycycline . Leukocytosis resolved, remains afebrile, neg MRSA PCR. Blood cultures NGTD. Plan to complete 5 days Tx. Change cefepime  to vantin (lost IV and improving from infectious standpoint), complete 5 days of dosing for this.    Acute metabolic encephalopathy: Secondary to acute on chronic renal failure and infectious process. B12 wnl at 1,260, folate 14.4, TSH 1.211, CT head nonacute.  - Improved, back to suspected baseline but still not fully oriented.  - Attempting to reach family for continued discussions Re: disposition and goals of care. Again, suspect we may be headed in hospice direction.   Chronic HFpEF, HTN: 09/29/2023 echo EF 55 to 60%, no WMA, grade 1 DD, normal RVF, mild MR. Volume down initially, will need close monitoring on IVF.  - Hold torsemide  for now.   - Continue BP management with coreg  (monitor bradycardia), hydralazine , imdur  and  norvasc . Titrate to BP.    T2DM: HbA1c 6.9%.  - Hold tresiba , giving SSI   Acute on chronic anemia of CKD - Baseline hemoglobin 8, down to 6.5 on 7/15 without bleeding. s/p 2u RBCs with appropriate response hgb 9.9g/dl.   - Iron supp.   Hypoalbuminemia:  - Supp protein.   COPD: Quiescent.  - Continue  bronchodilators    L1 compression fracture - No signs of neurologic compromise on exam - PT evaluation >> SNF recommended, we are pursuing this.   Hypokalemia: Resolved with supplementation.  - Monitor daily.   DNR: POA. Noted.   [name], MD [facility] [email or web address] 02/01/2024, 8:15 AM

## 2024-02-01 NOTE — Progress Notes (Signed)
 Physical Therapy Treatment Patient Details Name: Maria Parrish MRN: [id number] DOB: 05-22-1946 Today's Date: 02/01/2024   History of Present Illness Maria Parrish is a 78 year old female with a history of contact impairment, CKD stage V, diabetes mellitus type 2, hypertension, hyperlipidemia, dCHF, and COPD presenting with altered mental status.  History is supplemented by the patient's son at the bedside.  The patient's son states that he was going to get the patient ready to go get blood work done when he found the patient on the floor next to her bedside commode and bed.  He stated that she was awake but confused.  The son states that the patient has not been feeling well since last Thursday, 01/24/2024.  On that day, the patient had a couple episodes of emesis and loose stools.  Since then, the patient has had decreased oral intake and generalized weakness.  There were no complaints of fevers, chills, chest pain, shortness breath, abdominal pain, headache.  Because the patient has had decreased oral intake and vomiting, the patient's son made the decision not to give her her usual medications over the last couple days.  He states that he held the patient's medications starting 01/25/2024.  At baseline, the patient is able to ambulate and make transfers with a walker.  She is conversant but is unaware of the date at baseline.  Presently, the patient denies any headache, chest pain, shortness of breath, coughing, hemoptysis, vomiting, abdominal pain, diarrhea.    PT Comments  Patient was agreeable to PT session. Received supine in bed, patient required min A to get EOB. Once EOB, attempts are made for seated UE/LE exercises and to stand but ultimately unsuccessful due to pt altered cognition this afternoon vs at evaluation. Patient is eager to stand but is also fearful of falling. Pt initiates movement  but due to weakness requires some assistance. When assistance is given pt tends to resist. Multiple attempts  made as pt reports she wants to get into recliner but unable to this date. Will attempt again at later date. Patient returned to bed with assist of nursing staff. Mod A to boost to [facility], pt able to assist via bridging and UE use. Left with nursing staff. Patient will benefit from continued skilled physical therapy acutely and in recommended venue in order to address cont deficits.     If plan is discharge home, recommend the following: A lot of help with bathing/dressing/bathroom;A lot of help with walking and/or transfers;Help with stairs or ramp for entrance;Assistance with cooking/housework;Assist for transportation   Can travel by private vehicle     No  Equipment Recommendations  None recommended by PT    Recommendations for Other Services       Precautions / Restrictions Precautions Precautions: Fall Recall of Precautions/Restrictions: Impaired Restrictions Weight Bearing Restrictions Per Provider Order: No     Mobility  Bed Mobility Overal bed mobility: Needs Assistance Bed Mobility: Supine to Sit, Sit to Supine     Supine to sit: Mod assist Sit to supine: Mod assist   General bed mobility comments: Mod A for sit<>supine and boosting to [facility], mostly due to confusion.    Transfers Overall transfer level: Needs assistance Equipment used: Rolling walker (2 wheels), None Transfers: Sit to/from Stand Sit to Stand: Mod assist           General transfer comment: Unable to achieve full stand this date. Numerous attempts made with and without AD. With and without nursing staff assist. Pt with inc  confusion this afternoon. Pt initiates stand but unable to complete withotu A due to weaknes but with assist, pt tends to push in opposite direction/resist. Pt reporting she is fearful.    Ambulation/Gait       General Gait Details: Unable to attempt this date. See Transfers   Stairs             Wheelchair Mobility     Tilt Bed    Modified Rankin (Stroke  Patients Only)       Balance Overall balance assessment: Needs assistance Sitting-balance support: Feet supported, No upper extremity supported Sitting balance-Leahy Scale: Fair Sitting balance - Comments: fair/good seated at EOB       Standing balance comment: Unable to assess this date. See Transfers            Communication Communication Communication: No apparent difficulties  Cognition Arousal: Alert Behavior During Therapy: Anxious   PT - Cognitive impairments: No family/caregiver present to determine baseline, Memory         PT - Cognition Comments: pt oriented to self only. disoriented to place, time and situation. pt fearful and confused throughout session Following commands: Impaired Following commands impaired: Follows one step commands inconsistently    Cueing Cueing Techniques: Verbal cues, Tactile cues, Visual cues, Gestural cues  Exercises Other Exercises Other Exercises: Attmepts at completing seated LE/UE exercises while seated EOB. Pt with inc confusion this date. Used verbal, tactile, gestural, and visual cueing. Pt tends to repeat last few words of PTs last sentence, and when asked if she can do this exercise (with example given) says yes but no active movement to complete.    General Comments        Pertinent Vitals/Pain Pain Assessment Pain Assessment: No/denies pain    Home Living                          Prior Function            PT Goals (current goals can now be found in the care plan section) Acute Rehab PT Goals Patient Stated Goal: return home with family to assist PT Goal Formulation: With patient Time For Goal Achievement: 02/13/24 Potential to Achieve Goals: Good Progress towards PT goals: Progressing toward goals    Frequency    Min 3X/week      PT Plan      Co-evaluation              AM-PAC PT 6 Clicks Mobility   Outcome Measure  Help needed turning from your back to your side while in a  flat bed without using bedrails?: A Little Help needed moving from lying on your back to sitting on the side of a flat bed without using bedrails?: A Little Help needed moving to and from a bed to a chair (including a wheelchair)?: A Lot Help needed standing up from a chair using your arms (e.g., wheelchair or bedside chair)?: A Lot Help needed to walk in hospital room?: A Lot Help needed climbing 3-5 steps with a railing? : A Lot 6 Click Score: 14    End of Session Equipment Utilized During Treatment: Gait belt Activity Tolerance: Other (comment) (Limited due to cognition) Patient left: in bed;with call bell/phone within reach;with nursing/sitter in room Nurse Communication: Mobility status PT Visit Diagnosis: Unsteadiness on feet (R26.81);Other abnormalities of gait and mobility (R26.89);Muscle weakness (generalized) (M62.81)     Time: 8558-8497 PT Time Calculation (min) (ACUTE ONLY): 21  min  Charges:    $Therapeutic Activity: 8-22 mins PT General Charges $$ ACUTE PT VISIT: 1 Visit                     4:13 PM, 02/01/24 Dequarius Jeffries Powell-Butler, PT, DPT [place] with [facility]

## 2024-02-01 NOTE — Plan of Care (Signed)
   Problem: Activity: Goal: Risk for activity intolerance will decrease Outcome: Progressing   Problem: Coping: Goal: Level of anxiety will decrease Outcome: Progressing

## 2024-02-01 NOTE — Progress Notes (Signed)
 IV access attempted x2 unsuccessfully. Dr. Charlton made aware.

## 2024-02-01 NOTE — TOC Progression Note (Signed)
 Transition of Care [facility]) - Progression Note    Patient Details  Name: Maria Parrish MRN: [id number] Date of Birth: Jun 18, 1946  Transition [facility]) CM/SW Contact  Hoy DELENA Bigness, LCSW Phone Number: 02/01/2024, 12:42 PM  Clinical Narrative:    Pt's insurance auth approved for [facility] SNF and is valid through 02/05/2024. Pt not medically stable today due to worsening kidney function. [facility] able to accept over the weekend. Pt will be going to room C26-2. RN to call report to [phone]. Weekend TOC to call Debbie at [phone] to arrange discharge.   Expected Discharge Plan: Home w Home Health Services Barriers to Discharge: Continued Medical Work up  Expected Discharge Plan and Services In-house Referral: Clinical Social Work Discharge Planning Services: CM Consult   Living arrangements for the past 2 months: Single Family Home                                       Social Determinants of Health (SDOH) Interventions SDOH Screenings   Food Insecurity: No Food Insecurity (01/28/2024)  Housing: Low Risk  (01/28/2024)  Transportation Needs: No Transportation Needs (01/28/2024)  Utilities: Not At Risk (01/28/2024)  Depression (PHQ2-9): Low Risk  (09/14/2023)  Physical Activity: Inactive (08/20/2020)  Social Connections: Moderately Integrated (01/28/2024)  Tobacco Use: Low Risk  (01/29/2024)    Readmission Risk Interventions    01/29/2024   12:50 PM 11/19/2023    3:13 PM 09/29/2023    4:26 PM  Readmission Risk Prevention Plan  Transportation Screening Complete Complete Complete  PCP or Specialist Appt within 3-5 Days  Not Complete Complete  HRI or Home Care Consult Complete Complete Complete  Social Work Consult for Recovery Care Planning/Counseling Complete Not Complete Complete  Palliative Care Screening Not Applicable Not Complete Complete  Medication Review Oceanographer) Complete Complete Complete

## 2024-02-02 DIAGNOSIS — N185 Chronic kidney disease, stage 5: Secondary | ICD-10-CM | POA: Diagnosis not present

## 2024-02-02 DIAGNOSIS — N179 Acute kidney failure, unspecified: Secondary | ICD-10-CM | POA: Diagnosis not present

## 2024-02-02 LAB — RENAL FUNCTION PANEL
Albumin: 2.7 g/dL — ABNORMAL LOW (ref 3.5–5.0)
Anion gap: 13 (ref 5–15)
BUN: 63 mg/dL — ABNORMAL HIGH (ref 8–23)
CO2: 17 mmol/L — ABNORMAL LOW (ref 22–32)
Calcium: 8.8 mg/dL — ABNORMAL LOW (ref 8.9–10.3)
Chloride: 112 mmol/L — ABNORMAL HIGH (ref 98–111)
Creatinine, Ser: 6.06 mg/dL — ABNORMAL HIGH (ref 0.44–1.00)
GFR, Estimated: 7 mL/min — ABNORMAL LOW (ref >60)
Glucose, Bld: 120 mg/dL — ABNORMAL HIGH (ref 70–99)
Phosphorus: 4.9 mg/dL — ABNORMAL HIGH (ref 2.5–4.6)
Potassium: 3.3 mmol/L — ABNORMAL LOW (ref 3.5–5.1)
Sodium: 142 mmol/L (ref 135–145)

## 2024-02-02 LAB — CULTURE, BLOOD (ROUTINE X 2)
Culture: NO GROWTH
Culture: NO GROWTH
Special Requests: ADEQUATE

## 2024-02-02 LAB — GLUCOSE, CAPILLARY
Glucose-Capillary: 116 mg/dL — ABNORMAL HIGH (ref 70–99)
Glucose-Capillary: 117 mg/dL — ABNORMAL HIGH (ref 70–99)

## 2024-02-02 MED ORDER — INSULIN ASPART 100 UNIT/ML IJ SOLN: 0.0000 [IU] | Freq: Three times a day (TID) | INTRAMUSCULAR | Status: DC

## 2024-02-02 MED ORDER — SODIUM BICARBONATE 650 MG PO TABS: 1300.0000 mg | ORAL_TABLET | Freq: Three times a day (TID) | ORAL | 1 refills | Status: DC

## 2024-02-02 MED ORDER — TORSEMIDE 20 MG PO TABS: 20.0000 mg | ORAL_TABLET | Freq: Every day | ORAL | Status: DC

## 2024-02-02 NOTE — Progress Notes (Signed)
 Report called to [facility]: Maria Parrish report given to [employer]. Attempted to call patients son regarding patient discharging unable to reach at this time.

## 2024-02-02 NOTE — Discharge Summary (Signed)
 Physician Discharge Summary   Patient: Maria Parrish MRN: [id number] DOB: May 16, 1946  Admit date:     01/28/2024  Discharge date: 02/02/24  Discharge Physician: [name]   PCP: Margarete Maeola DASEN, FNP   Recommendations at discharge:  Continue palliative/hospice care for progressive renal failure. Cr stable at ~6 on discharge from previous baseline ~4.5, adequate UOP, no volume overload, taking po. She is DNR/DNI and the plan is for supportive care only for this. Suggest recheck BMP and CBC in the next week. If renal function worsens, suggest full comfort measures. Note medication changes from home therapies including stopping tresiba , replaced with sensitive sliding scale novolog , decreasing torsemide  to 20mg  daily instead of BID, and increasing bicarbonate tablets.   Discharge Diagnoses: Principal Problem:   Acute renal failure superimposed on stage 5 chronic kidney disease, not on chronic dialysis [facility]) Active Problems:   Mixed hyperlipidemia   Type 2 diabetes mellitus without complication, with long-term current use of insulin  (HCC)   Aspiration pneumonitis (HCC)   Lobar pneumonia (HCC)   Chronic heart failure with preserved ejection fraction (HFpEF) (HCC)   Acute metabolic encephalopathy  Hospital Course: 78 year old female with a history of contact impairment, CKD stage V, diabetes mellitus type 2, hypertension, hyperlipidemia, HFpEF (recent admission 5/4-5/7 discharged on torsemide  20mg  BID), COPD, and DNR status who presented to the [facility] 7/14 after her son found her awake and confused on the floor next to her bedside commode and bed, having had emesis, poor oral intake, and generalized weakness for the past 4 days.    In the ED, she was afebrile and hemodynamically stable with oxygen  saturation 98% on room air. Hypokalemia (K 2.3), and AKI (SCr 5.97 from baseline of 4.1-4.4) with metabolic acidosis (bicarb 17) were noted. CT of the brain was negative for acute findings. CT of the  cervical spine was negative for any traumatic injuries.  CT of the CAP showed LLL and RML patchy opacities with bilateral trace pleural effusions and old healed sacral insufficiency fractures and old left inferior pubic rami fractures. There was some subcutaneous edema about the left hip. CT of the L-spine showed age-indeterminate L1 fracture with 35% height loss. CT of the thoracic spine was negative for any injury.     The patient was started on IV fluids, doxycycline , and ceftriaxone  in the emergency department.  For her elevated blood pressure, she was given amlodipine , carvedilol , hydralazine  50 mg, and Imdur .  With treatments, mental status has returned to baseline. Creatinine has stabilized at a new baseline that is worse than previous. Nephrology and palliative care have been involved, delineating goals of care including desire for no hemodialysis, and if renal function worsens, transition to comfort measures only. On 7/19, creatinine has stabilized and she has completed antibiotics for a possible pneumonia. She will require SNF level of care which is pursued.  Assessment and Plan: AKI on stage V CKD with NAGMA: Baseline Cr ~4.4, has been 5.97 > > 6.14 and settling at ~6 this admission (CrCl stable at 12ml/min) - Suspected volume depletion, holding diuretic. Taking po so holding IVF. Appears euvolemic. Cr still worsening slowly, will not pursue HD per pt/family goals of care, so we may enroll in hospice.  - Element of mild rhabdomyolysis though CK has cleared.  - UA without casts, +pyuria and nephrotic proteinuria (spot UPr:Cr 4.36).  - NAGMA worsening, compensatory tachypnea noted, increased bicarb tabs.  - Nephrology consulted, no change to above management recommended. Not HD candidate and not within GOC.  Appreciate their assistance.    Lobar pneumonia/aspiration pneumonia: Resolved. - Started cefepime  and doxycycline . Leukocytosis resolved, remains afebrile, neg MRSA PCR. Blood cultures  NGTD. Completed 5 days Tx.     Acute metabolic encephalopathy: Secondary to acute on chronic renal failure and infectious process. B12 wnl at 1,260, folate 14.4, TSH 1.211, CT head nonacute.  - Improved, back to suspected baseline but still not fully oriented.  - Attempting to reach family for continued discussions Re: disposition and goals of care. This has been hit or miss. Again, suspect we may be headed in hospice direction.   Chronic HFpEF, HTN: 09/29/2023 echo EF 55 to 60%, no WMA, grade 1 DD, normal RVF, mild MR. Volume down initially. - Held torsemide  initially, restart at 20mg  daily dosing.  - Continue BP management with coreg  (monitor bradycardia), hydralazine , imdur  and norvasc . Titrate to BP.    T2DM: HbA1c 6.9%.  - Hold tresiba , giving SSI which is no more than 6-8 units total per day with good control. Will stop tresiba  28 units for now.   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 s/p 2u RBCs with appropriate response hgb 9.9g/dl.   - Iron supplement   Hypoalbuminemia:  - Supp protein orally as able   COPD: Quiescent.  - Continue bronchodilator prn    L1 compression fracture - No signs of neurologic compromise on exam - PT evaluation >> SNF recommended, we are pursuing this.   Hypokalemia: Resolved with supplementation.  - Monitor early next week.  DNR: POA. Noted.  - If renal function declines further, suggest shift to comfort measures.   Consultants: Nephrology, palliative care Procedures performed: None  Disposition: Skilled nursing facility Diet recommendation:  Renal diet DISCHARGE MEDICATION: Allergies as of 02/02/2024       Reactions   Statins Other (See Comments)   Makes wrists and fingers ache Muscle Pain and Joint Pain   Penicillins Other (See Comments)   Makes patient sore.        Medication List     STOP taking these medications    Tresiba  FlexTouch 100 UNIT/ML FlexTouch Pen Generic  drug: insulin  degludec       TAKE these medications    albuterol  108 (90 Base) MCG/ACT inhaler Commonly known as: VENTOLIN  HFA Inhale 2 puffs into the lungs every 6 (six) hours as needed for wheezing or shortness of breath.   amLODipine  10 MG tablet Commonly known as: NORVASC  Take 1 tablet by mouth once daily   carvedilol  12.5 MG tablet Commonly known as: COREG  Take 1 tablet by mouth twice daily What changed: when to take this   epoetin  alfa-epbx 4000 UNIT/ML injection Commonly known as: RETACRIT  Inject 4,000 Units into the skin 2 (two) times a week. Friday   ezetimibe  10 MG tablet Commonly known as: ZETIA  Take 1 tablet (10 mg total) by mouth daily.   ferrous sulfate  325 (65 FE) MG tablet Take 325 mg by mouth daily with breakfast.   hydrALAZINE  100 MG tablet Commonly known as: APRESOLINE  Take 0.5 tablets (50 mg total) by mouth 3 (three) times daily.   insulin  aspart 100 UNIT/ML injection Commonly known as: novoLOG  Inject 0-9 Units into the skin 3 (three) times daily with meals.   isosorbide  mononitrate 30 MG 24 hr tablet Commonly known as: IMDUR  Take 1 tablet (30 mg total) by mouth daily.   pantoprazole  40 MG tablet Commonly known as: Protonix  Take 1 tablet (40 mg total) by mouth  daily.   sodium bicarbonate  650 MG tablet Take 2 tablets (1,300 mg total) by mouth 3 (three) times daily. What changed:  how much to take when to take this   torsemide  20 MG tablet Commonly known as: DEMADEX  Take 1 tablet (20 mg total) by mouth daily. What changed: when to take this   VITAMIN B-12 PO Take 1 tablet by mouth daily.        Contact information for follow-up providers     Margarete Maeola DASEN, FNP Follow up.   Specialty: Nurse Practitioner Contact information: [address] [place] [address] [phone]         Alvan Dorn FALCON, MD Follow up.   Specialty: Cardiology Contact information: [address] [place] [address] [phone]               Contact information for after-discharge care     Destination     [facility] .   Service: Skilled Nursing Contact information: [address] [place] [place]  [address] [phone]                    Discharge Exam: Fredricka Weights   02/01/24 0900  Weight: 66.7 kg  BP (!) 181/95 (BP Location: Left Arm)   Pulse 65   Temp 97.6 F (36.4 C) (Axillary)   Resp 18   Wt 66.7 kg   SpO2 96%   BMI 25.24 kg/m   No distress, elderly chronically ill-appearing female  Clear, nonlabored, normal rate RRR, no MRG, no edema Alert, interactive, incompletely oriented  Condition at discharge: stable  The results of significant diagnostics from this hospitalization (including imaging, microbiology, ancillary and laboratory) are listed below for reference.   Imaging Studies: US  RENAL Result Date: 01/30/2024 CLINICAL DATA:  Acute kidney insufficiency EXAM: RENAL / URINARY TRACT ULTRASOUND COMPLETE COMPARISON:  Renal ultrasound September 2023. Noncontrast CT 01/28/2024. FINDINGS: Right Kidney: Renal measurements: 9.1 x 4.2 x 5.0 cm = volume: 98.9 mL. No collecting system dilatation or perinephric fluid. There are some anechoic small foci consistent with small cysts. These measure up to 12 mm. These were seen previously. Left Kidney: Renal measurements: 8.4 x 4.7 x 4.5 cm = volume: 94.3 mL. No collecting system dilatation. Slight perinephric fluid. Bladder: Underdistended bladder.  Slight wall thickening. Other: Limited by overlapping bowel gas and soft tissue IMPRESSION: No collecting system dilatation. Underdistended urinary bladder with slight wall thickening. Right-sided small renal cysts as seen previously. Electronically Signed   By: Ranell Bring M.D.   On: 01/30/2024 17:34   CT CHEST ABDOMEN PELVIS WO CONTRAST Result Date: 01/28/2024 CLINICAL DATA:  Polytrauma, blunt Table formatting from the original note was not included.  Pt bib rcems after call from family that pt was found altered and down by her toilet. Family reported pt has not been taking her medications. Pt smells of UTI. EXAM: CT CHEST, ABDOMEN AND PELVIS WITHOUT CONTRAST TECHNIQUE: Multidetector CT imaging of the chest, abdomen and pelvis was performed following the standard protocol without IV contrast. RADIATION DOSE REDUCTION: This exam was performed according to the departmental dose-optimization program which includes automated exposure control, adjustment of the mA and/or kV according to patient size and/or use of iterative reconstruction technique. COMPARISON:  None Available. FINDINGS: CHEST: Cardiovascular: The thoracic aorta is normal in caliber. The heart is enlarged in size. No significant pericardial effusion. Cardiac findings suggestive of anemia. Severe atherosclerotic plaque. Four-vessel coronary artery calcification. Lungs/Pleura: Slight mosaic attenuation  of the bilateral upper lobes. Trace biapical interlobular septal wall thickening. Left lower lobe and right middle lobe patchy airspace opacity. No pulmonary nodule. No pulmonary mass. No pulmonary contusion or laceration. No pneumatocele formation. Bilateral trace to small volume pleural effusions. No pneumothorax. No hemothorax. Mediastinum/Nodes: No pneumomediastinum. The central airways are patent. The esophagus is unremarkable. The thyroid  is unremarkable. Limited evaluation for hilar lymphadenopathy on this noncontrast study. No mediastinal or axillary lymphadenopathy. Musculoskeletal/Chest wall No chest wall mass. No acute rib or sternal fracture. No spinal fracture. ABDOMEN / PELVIS: Hepatobiliary: Not enlarged. No focal lesion. The gallbladder is otherwise unremarkable with no radio-opaque gallstones. No biliary ductal dilatation. Pancreas: Normal pancreatic contour. No main pancreatic duct dilatation. Spleen: Not enlarged. No focal lesion. Adrenals/Urinary Tract: Left adrenal gland hyperplasia.   No nodularity bilaterally. No hydroureteronephrosis. No ureterolithiasis. Indications associated with the kidneys are likely vascular. Possible tiny nonobstructive nephrolithiasis noted bilaterally. No contour deforming renal mass. The urinary bladder is grossly unremarkable unremarkable. Stomach/Bowel: No small or large bowel wall thickening or dilatation. Colonic diverticulosis. The appendix is unremarkable. Vasculature/Lymphatic: Severe atherosclerotic plaque. No abdominal aorta or iliac aneurysm. No abdominal, pelvic, inguinal lymphadenopathy. Reproductive: Lobulated uterus with multiple calcified lesions. Other: No simple free fluid ascites. No pneumoperitoneum. No mesenteric hematoma identified. No organized fluid collection. Musculoskeletal: Left hip subcutaneus soft tissue edema with possible developing hematoma. No acute pelvic fracture. Severe degenerative changes of the bilateral hips. Old healed sacral insufficiency fractures. Old healed left inferior pubic rami fracture. Please see separately dictated CT thoracolumbar spine. Other ports and devices: None. IMPRESSION: 1. Left lower lobe and right middle lobe patchy airspace opacity. Findings suggestive of multifocal pneumonia. 2. Bilateral trace to small volume pleural effusions. 3. No acute intrathoracic, intra-abdominal, intrapelvic traumatic injury with limited evaluation on this noncontrast study. 4. Please see separately dictated CT thoracolumbar spine. 5. Other imaging findings of potential clinical significance: Cardiomegaly. Colonic diverticulosis with no acute diverticulitis. Degenerative uterine fibroids. Aortic Atherosclerosis (ICD10-I70.0) including four-vessel coronary artery calcification. Electronically Signed   By: Morgane  Naveau M.D.   On: 01/28/2024 14:02   CT T-SPINE NO CHARGE Result Date: 01/28/2024 CLINICAL DATA:  found altered and down by her toilet. Family reported pt has not been taking her medications. Pt smells of UTI. EXAM:  CT THORACIC AND LUMBAR SPINE WITHOUT CONTRAST TECHNIQUE: Multidetector CT imaging of the thoracic and lumbar spine was performed without contrast. Multiplanar CT image reconstructions were also generated. RADIATION DOSE REDUCTION: This exam was performed according to the departmental dose-optimization program which includes automated exposure control, adjustment of the mA and/or kV according to patient size and/or use of iterative reconstruction technique. COMPARISON:  Chest x-ray 05/01/2017 FINDINGS: CT THORACIC SPINE FINDINGS Alignment: Normal. Vertebrae: Chronic vague superior endplate concavity at the T2, T3, T4 levels. No acute fracture or focal pathologic process. Paraspinal and other soft tissues: Negative. Disc levels: Maintained. CT LUMBAR SPINE FINDINGS Segmentation: 5 lumbar type vertebrae. Alignment: Normal. Vertebrae: Multilevel moderate severe degenerative changes spine most prominent at the L4-L5 and L5-S1 levels. Age-indeterminate L1 superior endplate compression fracture with prominent Schmorl node. At least 35% vertebral body height loss. No focal pathologic process. Paraspinal and other soft tissues: Negative. Disc levels: Multilevel intervertebral disc space vacuum phenomenon. Other: Please see separately dictated CT chest abdomen pelvis 01/28/2024. IMPRESSION: CT THORACIC SPINE IMPRESSION 1. No acute displaced fracture or traumatic listhesis of the thoracic spine. CT LUMBAR SPINE IMPRESSION 1. Age-indeterminate L1 superior endplate compression fracture with prominent Schmorl node. At least 35% vertebral  body height loss. Correlate with point tenderness to palpation to evaluate for an acute component. 1. Other: Please see separately dictated CT chest abdomen pelvis 01/28/2024. Electronically Signed   By: Morgane  Naveau M.D.   On: 01/28/2024 13:54   CT L-SPINE NO CHARGE Result Date: 01/28/2024 CLINICAL DATA:  found altered and down by her toilet. Family reported pt has not been taking her  medications. Pt smells of UTI. EXAM: CT THORACIC AND LUMBAR SPINE WITHOUT CONTRAST TECHNIQUE: Multidetector CT imaging of the thoracic and lumbar spine was performed without contrast. Multiplanar CT image reconstructions were also generated. RADIATION DOSE REDUCTION: This exam was performed according to the departmental dose-optimization program which includes automated exposure control, adjustment of the mA and/or kV according to patient size and/or use of iterative reconstruction technique. COMPARISON:  Chest x-ray 05/01/2017 FINDINGS: CT THORACIC SPINE FINDINGS Alignment: Normal. Vertebrae: Chronic vague superior endplate concavity at the T2, T3, T4 levels. No acute fracture or focal pathologic process. Paraspinal and other soft tissues: Negative. Disc levels: Maintained. CT LUMBAR SPINE FINDINGS Segmentation: 5 lumbar type vertebrae. Alignment: Normal. Vertebrae: Multilevel moderate severe degenerative changes spine most prominent at the L4-L5 and L5-S1 levels. Age-indeterminate L1 superior endplate compression fracture with prominent Schmorl node. At least 35% vertebral body height loss. No focal pathologic process. Paraspinal and other soft tissues: Negative. Disc levels: Multilevel intervertebral disc space vacuum phenomenon. Other: Please see separately dictated CT chest abdomen pelvis 01/28/2024. IMPRESSION: CT THORACIC SPINE IMPRESSION 1. No acute displaced fracture or traumatic listhesis of the thoracic spine. CT LUMBAR SPINE IMPRESSION 1. Age-indeterminate L1 superior endplate compression fracture with prominent Schmorl node. At least 35% vertebral body height loss. Correlate with point tenderness to palpation to evaluate for an acute component. 1. Other: Please see separately dictated CT chest abdomen pelvis 01/28/2024. Electronically Signed   By: Morgane  Naveau M.D.   On: 01/28/2024 13:54   DG Chest Port 1 View Result Date: 01/28/2024 CLINICAL DATA:  Questionable sepsis - evaluate for abnormality  EXAM: PORTABLE CHEST 1 VIEW COMPARISON:  CT chest 01/28/2024, chest x-ray 11/18/2023 FINDINGS: The heart and mediastinal contours are unchanged. Atherosclerotic plaque. Left lower lobe and right middle lobe patchy airspace opacities. No pulmonary edema. Bilateral trace to small volume pleural effusions. No pneumothorax. No acute osseous abnormality. IMPRESSION: 1. Left lower lobe and right middle lobe patchy airspace opacities. Findings suggestive of multifocal pneumonia. Followup PA and lateral chest X-ray is recommended in 3-4 weeks following therapy to ensure resolution. 2. Bilateral trace to small volume pleural effusions. Electronically Signed   By: Morgane  Naveau M.D.   On: 01/28/2024 13:46   CT Head Wo Contrast Result Date: 01/28/2024 CLINICAL DATA:  Head trauma, moderate-severe; Neck trauma (Age >= 65y) call from family that pt was found altered and down by her toilet. Family reported pt has not been taking her medications. Pt smells of UTI. EXAM: CT HEAD WITHOUT CONTRAST CT CERVICAL SPINE WITHOUT CONTRAST TECHNIQUE: Multidetector CT imaging of the head and cervical spine was performed following the standard protocol without intravenous contrast. Multiplanar CT image reconstructions of the cervical spine were also generated. RADIATION DOSE REDUCTION: This exam was performed according to the departmental dose-optimization program which includes automated exposure control, adjustment of the mA and/or kV according to patient size and/or use of iterative reconstruction technique. COMPARISON:  CT head 05/01/2017. FINDINGS: CT HEAD FINDINGS Brain: Cerebral ventricle sizes are concordant with the degree of cerebral volume loss. Patchy and confluent areas of decreased attenuation are noted throughout the  deep and periventricular white matter of the cerebral hemispheres bilaterally, compatible with chronic microvascular ischemic disease. No evidence of large-territorial acute infarction. No parenchymal  hemorrhage. No mass lesion. No extra-axial collection. No mass effect or midline shift. No hydrocephalus. Basilar cisterns are patent. Vascular: No hyperdense vessel. Atherosclerotic calcifications are present within the cavernous internal carotid and vertebral arteries. Skull: No acute fracture or focal lesion. Sinuses/Orbits: Paranasal sinuses and mastoid air cells are clear. The orbits are unremarkable. Other: Left likely subacute 6 mm scalp hematoma. Midline likely subacute 5 mm scalp hematoma. CT CERVICAL SPINE FINDINGS Alignment: Reversal of normal cervical lordosis centered at the C5 level likely due to positioning and degenerative changes. Grade 1 anterolisthesis of C3 on C4 and C4 on C5. Skull base and vertebrae: Multilevel mild-to-moderate degenerative change of the spine most prominent at the C5-C6 levels. No associated severe osseous neural foraminal or central canal stenosis. No acute fracture. No aggressive appearing focal osseous lesion or focal pathologic process. Soft tissues and spinal canal: No prevertebral fluid or swelling. No visible canal hematoma. Upper chest: Unremarkable. Other: Atherosclerotic plaque of the carotid arteries within the neck. IMPRESSION: 1. No acute intracranial abnormality. 2. No acute displaced fracture or traumatic listhesis of the cervical spine. Electronically Signed   By: Morgane  Naveau M.D.   On: 01/28/2024 13:44   CT Cervical Spine Wo Contrast Result Date: 01/28/2024 CLINICAL DATA:  Head trauma, moderate-severe; Neck trauma (Age >= 65y) call from family that pt was found altered and down by her toilet. Family reported pt has not been taking her medications. Pt smells of UTI. EXAM: CT HEAD WITHOUT CONTRAST CT CERVICAL SPINE WITHOUT CONTRAST TECHNIQUE: Multidetector CT imaging of the head and cervical spine was performed following the standard protocol without intravenous contrast. Multiplanar CT image reconstructions of the cervical spine were also generated.  RADIATION DOSE REDUCTION: This exam was performed according to the departmental dose-optimization program which includes automated exposure control, adjustment of the mA and/or kV according to patient size and/or use of iterative reconstruction technique. COMPARISON:  CT head 05/01/2017. FINDINGS: CT HEAD FINDINGS Brain: Cerebral ventricle sizes are concordant with the degree of cerebral volume loss. Patchy and confluent areas of decreased attenuation are noted throughout the deep and periventricular white matter of the cerebral hemispheres bilaterally, compatible with chronic microvascular ischemic disease. No evidence of large-territorial acute infarction. No parenchymal hemorrhage. No mass lesion. No extra-axial collection. No mass effect or midline shift. No hydrocephalus. Basilar cisterns are patent. Vascular: No hyperdense vessel. Atherosclerotic calcifications are present within the cavernous internal carotid and vertebral arteries. Skull: No acute fracture or focal lesion. Sinuses/Orbits: Paranasal sinuses and mastoid air cells are clear. The orbits are unremarkable. Other: Left likely subacute 6 mm scalp hematoma. Midline likely subacute 5 mm scalp hematoma. CT CERVICAL SPINE FINDINGS Alignment: Reversal of normal cervical lordosis centered at the C5 level likely due to positioning and degenerative changes. Grade 1 anterolisthesis of C3 on C4 and C4 on C5. Skull base and vertebrae: Multilevel mild-to-moderate degenerative change of the spine most prominent at the C5-C6 levels. No associated severe osseous neural foraminal or central canal stenosis. No acute fracture. No aggressive appearing focal osseous lesion or focal pathologic process. Soft tissues and spinal canal: No prevertebral fluid or swelling. No visible canal hematoma. Upper chest: Unremarkable. Other: Atherosclerotic plaque of the carotid arteries within the neck. IMPRESSION: 1. No acute intracranial abnormality. 2. No acute displaced fracture  or traumatic listhesis of the cervical spine. Electronically Signed   By:  Morgane  Naveau M.D.   On: 01/28/2024 13:44    Microbiology: Results for orders placed or performed during the hospital encounter of 01/28/24  Blood Culture (routine x 2)     Status: None (Preliminary result)   Collection Time: 01/28/24 12:27 PM   Specimen: BLOOD  Result Value Ref Range Status   Specimen Description BLOOD LEFT ASSIST CONTROL  Final   Special Requests   Final    BOTTLES DRAWN AEROBIC AND ANAEROBIC Blood Culture adequate volume   Culture   Final    NO GROWTH 4 DAYS Performed at [facility], [address] [place], [place] [address]    Report Status PENDING  Incomplete  Blood Culture (routine x 2)     Status: None (Preliminary result)   Collection Time: 01/28/24 12:27 PM   Specimen: BLOOD  Result Value Ref Range Status   Specimen Description BLOOD RIGHT ASSIST CONTROL  Final   Special Requests   Final    BOTTLES DRAWN AEROBIC AND ANAEROBIC Blood Culture results may not be optimal due to an inadequate volume of blood received in culture bottles   Culture   Final    NO GROWTH 4 DAYS Performed at [facility], [address] [place], [place] [address]    Report Status PENDING  Incomplete  Resp panel by RT-PCR (RSV, Flu A&B, Covid) Anterior Nasal Swab     Status: None   Collection Time: 01/28/24 12:46 PM   Specimen: Anterior Nasal Swab  Result Value Ref Range Status   SARS Coronavirus 2 by RT PCR NEGATIVE NEGATIVE Final    Comment: (NOTE) SARS-CoV-2 target nucleic acids are NOT DETECTED.  The SARS-CoV-2 RNA is generally detectable in upper respiratory specimens during the acute phase of infection. The lowest concentration of SARS-CoV-2 viral copies this assay can detect is 138 copies/mL. A negative result does not preclude SARS-Cov-2 infection and should not be used as the sole basis for treatment or other patient management decisions. A negative result may occur with  improper specimen  collection/handling, submission of specimen other than nasopharyngeal swab, presence of viral mutation(s) within the areas targeted by this assay, and inadequate number of viral copies(<138 copies/mL). A negative result must be combined with clinical observations, patient history, and epidemiological information. The expected result is Negative.  Fact Sheet for Patients:  BloggerCourse.com  Fact Sheet for Healthcare Providers:  SeriousBroker.it  This test is no t yet approved or cleared by the [place]  FDA and  has been authorized for detection and/or diagnosis of SARS-CoV-2 by FDA under an Emergency Use Authorization (EUA). This EUA will remain  in effect (meaning this test can be used) for the duration of the COVID-19 declaration under Section 564(b)(1) of the Act, 21 U.S.C.section 360bbb-3(b)(1), unless the authorization is terminated  or revoked sooner.       Influenza A by PCR NEGATIVE NEGATIVE Final   Influenza B by PCR NEGATIVE NEGATIVE Final    Comment: (NOTE) The Xpert Xpress SARS-CoV-2/FLU/RSV plus assay is intended as an aid in the diagnosis of influenza from Nasopharyngeal swab specimens and should not be used as a sole basis for treatment. Nasal washings and aspirates are unacceptable for Xpert Xpress SARS-CoV-2/FLU/RSV testing.  Fact Sheet for Patients: BloggerCourse.com  Fact Sheet for Healthcare Providers: SeriousBroker.it  This test is not yet approved or cleared by the [place]  FDA and has been authorized for detection and/or diagnosis of SARS-CoV-2 by FDA under an Emergency Use Authorization (EUA). This EUA will remain in effect (  meaning this test can be used) for the duration of the COVID-19 declaration under Section 564(b)(1) of the Act, 21 U.S.C. section 360bbb-3(b)(1), unless the authorization is terminated or revoked.     Resp Syncytial  Virus by PCR NEGATIVE NEGATIVE Final    Comment: (NOTE) Fact Sheet for Patients: BloggerCourse.com  Fact Sheet for Healthcare Providers: SeriousBroker.it  This test is not yet approved or cleared by the [place]  FDA and has been authorized for detection and/or diagnosis of SARS-CoV-2 by FDA under an Emergency Use Authorization (EUA). This EUA will remain in effect (meaning this test can be used) for the duration of the COVID-19 declaration under Section 564(b)(1) of the Act, 21 U.S.C. section 360bbb-3(b)(1), unless the authorization is terminated or revoked.  Performed at [facility], [address] [place], [place] [address]   Urine Culture     Status: Abnormal   Collection Time: 01/30/24  1:40 PM   Specimen: Urine, Random  Result Value Ref Range Status   Specimen Description   Final    URINE, RANDOM Performed at [facility], [address] [place], [place] [address]    Special Requests   Final    NONE Reflexed from [id number] Performed at [facility], [address] [place], [place] [address]    Culture (A)  Final    <10,000 COLONIES/mL INSIGNIFICANT GROWTH Performed at [facility], [address], [place], [place] [address]    Report Status 01/31/2024 FINAL  Final    Labs: CBC: Recent Labs  Lab 01/28/24 1227 01/29/24 0439 01/29/24 0717 01/29/24 1746 01/30/24 0447  WBC 11.3* 10.4  --   --  9.2  NEUTROABS 9.9*  --   --   --   --   HGB 8.5* 6.5* 6.5* 10.1* 9.9*  HCT 23.9* 19.8* 19.7* 29.9* 28.9*  MCV 85.4 86.8  --   --  90.3  PLT 396 289  --   --  281   Basic Metabolic Panel: Recent Labs  Lab 01/28/24 1227 01/29/24 0439 01/30/24 0447 01/31/24 0445 02/01/24 0449 02/02/24 0407  NA 142 142 141 143 141 142  K 2.3* 3.1* 3.5 3.5 3.6 3.3*  CL 106 113* 113* 115* 113* 112*  CO2 17* 18* 18* 17* 15* 17*  GLUCOSE 168* 121* 123* 130* 117* 120*  BUN 52* 53* 55* 58* 62* 63*  CREATININE 5.97* 5.51* 5.78*  6.14* 6.30* 6.06*  CALCIUM  9.3 8.3* 8.6* 9.0 8.9 8.8*  MG 2.0 1.8  --   --   --   --   PHOS  --   --   --  4.2 4.7* 4.9*   Liver Function Tests: Recent Labs  Lab 01/28/24 1227 01/31/24 0445 02/01/24 0449 02/02/24 0407  AST 21  --   --   --   ALT 17  --   --   --   ALKPHOS 57  --   --   --   BILITOT 0.9  --   --   --   PROT 7.0  --   --   --   ALBUMIN 3.1* 2.7* 2.8* 2.7*   CBG: Recent Labs  Lab 01/31/24 2121 02/01/24 0747 02/01/24 1126 02/01/24 1700 02/01/24 2036  GLUCAP 144* 123* 163* 108* 153*    Discharge time spent: greater than 30 minutes.  Signed: [name], MD [facility] 02/02/2024

## 2024-02-02 NOTE — TOC Transition Note (Signed)
 Transition of Care [facility] - Discharge Note   Patient Details  Name: Maria Parrish MRN: [id number] Date of Birth: 16-Aug-1945  Transition of Care [facility]) CM/SW Contact:  Nena LITTIE Coffee, RN Phone Number: 02/02/2024, 10:40 AM  Clinical Narrative:    Pt to discharge to [facility]. Report can be called to [phone]. Bedside nurse has been updated. Medical necessity sent to unit 300 printer and EMS has been called for transport.  Barriers to Discharge: Continued Medical Work up   Patient Goals and CMS Choice Patient states their goals for this hospitalization and ongoing recovery are:: get better CMS Medicare.gov Compare Post Acute Care list provided to:: Patient Represenative (must comment) Choice offered to / list presented to : Adult Children      Discharge Placement                       Discharge Plan and Services Additional resources added to the After Visit Summary for   In-house Referral: Clinical Social Work Discharge Planning Services: CM Consult                                 Social Drivers of Health (SDOH) Interventions SDOH Screenings   Food Insecurity: No Food Insecurity (01/28/2024)  Housing: Low Risk  (01/28/2024)  Transportation Needs: No Transportation Needs (01/28/2024)  Utilities: Not At Risk (01/28/2024)  Depression (PHQ2-9): Low Risk  (09/14/2023)  Physical Activity: Inactive (08/20/2020)  Social Connections: Moderately Integrated (01/28/2024)  Tobacco Use: Low Risk  (01/29/2024)     Readmission Risk Interventions    01/29/2024   12:50 PM 11/19/2023    3:13 PM 09/29/2023    4:26 PM  Readmission Risk Prevention Plan  Transportation Screening Complete Complete Complete  PCP or Specialist Appt within 3-5 Days  Not Complete Complete  HRI or Home Care Consult Complete Complete Complete  Social Work Consult for Recovery Care Planning/Counseling Complete Not Complete Complete  Palliative Care Screening Not Applicable Not Complete  Complete  Medication Review Oceanographer) Complete Complete Complete

## 2024-02-02 NOTE — Progress Notes (Signed)
 Patient discharged to [facility] today, transported by EMS. Discharge summary placed in discharge packet to give to facility. Belongings sent with patient.

## 2024-02-05 ENCOUNTER — Ambulatory Visit: Admitting: Podiatry

## 2024-02-20 ENCOUNTER — Other Ambulatory Visit: Payer: Self-pay | Admitting: "Endocrinology

## 2024-02-20 ENCOUNTER — Telehealth: Payer: Self-pay | Admitting: "Endocrinology

## 2024-02-20 MED ORDER — DEXCOM G7 RECEIVER DEVI
0 refills | Status: DC
Start: 2024-02-20 — End: 2024-03-01

## 2024-02-20 MED ORDER — DEXCOM G7 SENSOR MISC
2 refills | Status: DC
Start: 2024-02-20 — End: 2024-03-01

## 2024-02-20 NOTE — Telephone Encounter (Signed)
 Pt is currently at [facility] and the facility is reuqesting a RX of Dexcom to be sent to [facility] in [place] to see if Ins would cover that instead of having to prick her finger.

## 2024-02-21 ENCOUNTER — Telehealth: Payer: Self-pay

## 2024-02-21 NOTE — Telephone Encounter (Signed)
 Pharmacy Patient Advocate Encounter   Received notification from CoverMyMeds that prior authorization for Dexcom G7 sensor is required/requested.   Insurance verification completed.   The patient is insured through [employer] .   Per test claim: PA required; PA submitted to above mentioned insurance via CoverMyMeds Key/confirmation #/EOC [id number] Status is pending

## 2024-02-21 NOTE — Telephone Encounter (Signed)
 I attempted to call [facility] 2 times at [phone].  When the receptionist transferred the call was disconnected. May I ask you to call and share that Dr. Lenis has said, No . Patient has never been on this.

## 2024-02-22 NOTE — Telephone Encounter (Signed)
 Just FYI in case her son calls

## 2024-02-22 NOTE — Telephone Encounter (Signed)
 Dr. Lenis did call in CGM, PA is currently pending.

## 2024-02-25 ENCOUNTER — Telehealth: Payer: Self-pay

## 2024-02-25 ENCOUNTER — Encounter: Payer: Self-pay | Admitting: Nephrology

## 2024-02-25 ENCOUNTER — Other Ambulatory Visit (HOSPITAL_COMMUNITY): Payer: Self-pay

## 2024-02-25 NOTE — Telephone Encounter (Signed)
 Pharmacy Patient Advocate Encounter   Received notification from CoverMyMeds that prior authorization for Dexcom G7 Receiver device  is required/requested.   Insurance verification completed.   The patient is insured through [employer] .   Per test claim: The current 90 day co-pay is, $0.00.  No PA needed at this time. This test claim was processed through [facility]- copay amounts may vary at other pharmacies due to pharmacy/plan contracts, or as the patient moves through the different stages of their insurance plan.

## 2024-03-01 ENCOUNTER — Emergency Department (HOSPITAL_COMMUNITY)

## 2024-03-01 ENCOUNTER — Encounter (HOSPITAL_COMMUNITY): Payer: Self-pay | Admitting: Emergency Medicine

## 2024-03-01 ENCOUNTER — Inpatient Hospital Stay (HOSPITAL_COMMUNITY)
Admission: EM | Admit: 2024-03-01 | Discharge: 2024-03-06 | DRG: 871 | Disposition: A | Discharge status: Hospice | Source: Skilled Nursing Facility | Attending: Family Medicine | Admitting: Family Medicine

## 2024-03-01 ENCOUNTER — Other Ambulatory Visit: Payer: Self-pay

## 2024-03-01 DIAGNOSIS — G9341 Metabolic encephalopathy: Secondary | ICD-10-CM | POA: Diagnosis present

## 2024-03-01 DIAGNOSIS — K219 Gastro-esophageal reflux disease without esophagitis: Secondary | ICD-10-CM | POA: Diagnosis not present

## 2024-03-01 DIAGNOSIS — R4182 Altered mental status, unspecified: Principal | ICD-10-CM

## 2024-03-01 DIAGNOSIS — R6520 Severe sepsis without septic shock: Secondary | ICD-10-CM | POA: Diagnosis present

## 2024-03-01 DIAGNOSIS — Z794 Long term (current) use of insulin: Secondary | ICD-10-CM | POA: Diagnosis not present

## 2024-03-01 DIAGNOSIS — J189 Pneumonia, unspecified organism: Secondary | ICD-10-CM | POA: Diagnosis not present

## 2024-03-01 DIAGNOSIS — F0390 Unspecified dementia without behavioral disturbance: Secondary | ICD-10-CM | POA: Diagnosis not present

## 2024-03-01 DIAGNOSIS — N189 Chronic kidney disease, unspecified: Secondary | ICD-10-CM

## 2024-03-01 DIAGNOSIS — Z833 Family history of diabetes mellitus: Secondary | ICD-10-CM

## 2024-03-01 DIAGNOSIS — R4189 Other symptoms and signs involving cognitive functions and awareness: Secondary | ICD-10-CM | POA: Diagnosis present

## 2024-03-01 DIAGNOSIS — Z8249 Family history of ischemic heart disease and other diseases of the circulatory system: Secondary | ICD-10-CM

## 2024-03-01 DIAGNOSIS — E1122 Type 2 diabetes mellitus with diabetic chronic kidney disease: Secondary | ICD-10-CM | POA: Diagnosis present

## 2024-03-01 DIAGNOSIS — J810 Acute pulmonary edema: Secondary | ICD-10-CM

## 2024-03-01 DIAGNOSIS — E1142 Type 2 diabetes mellitus with diabetic polyneuropathy: Secondary | ICD-10-CM | POA: Diagnosis present

## 2024-03-01 DIAGNOSIS — D631 Anemia in chronic kidney disease: Secondary | ICD-10-CM | POA: Diagnosis present

## 2024-03-01 DIAGNOSIS — F0392 Unspecified dementia, unspecified severity, with psychotic disturbance: Secondary | ICD-10-CM | POA: Diagnosis present

## 2024-03-01 DIAGNOSIS — T68XXXA Hypothermia, initial encounter: Secondary | ICD-10-CM | POA: Insufficient documentation

## 2024-03-01 DIAGNOSIS — E876 Hypokalemia: Secondary | ICD-10-CM | POA: Diagnosis present

## 2024-03-01 DIAGNOSIS — Z6829 Body mass index (BMI) 29.0-29.9, adult: Secondary | ICD-10-CM | POA: Diagnosis not present

## 2024-03-01 DIAGNOSIS — Z7189 Other specified counseling: Secondary | ICD-10-CM | POA: Diagnosis not present

## 2024-03-01 DIAGNOSIS — Z515 Encounter for palliative care: Secondary | ICD-10-CM

## 2024-03-01 DIAGNOSIS — J9811 Atelectasis: Secondary | ICD-10-CM | POA: Diagnosis present

## 2024-03-01 DIAGNOSIS — F0394 Unspecified dementia, unspecified severity, with anxiety: Secondary | ICD-10-CM | POA: Diagnosis present

## 2024-03-01 DIAGNOSIS — E213 Hyperparathyroidism, unspecified: Secondary | ICD-10-CM | POA: Diagnosis present

## 2024-03-01 DIAGNOSIS — E785 Hyperlipidemia, unspecified: Secondary | ICD-10-CM | POA: Diagnosis present

## 2024-03-01 DIAGNOSIS — E871 Hypo-osmolality and hyponatremia: Secondary | ICD-10-CM | POA: Diagnosis present

## 2024-03-01 DIAGNOSIS — E440 Moderate protein-calorie malnutrition: Secondary | ICD-10-CM | POA: Diagnosis present

## 2024-03-01 DIAGNOSIS — Z79899 Other long term (current) drug therapy: Secondary | ICD-10-CM

## 2024-03-01 DIAGNOSIS — Z558 Other problems related to education and literacy: Secondary | ICD-10-CM | POA: Diagnosis not present

## 2024-03-01 DIAGNOSIS — Z888 Allergy status to other drugs, medicaments and biological substances status: Secondary | ICD-10-CM

## 2024-03-01 DIAGNOSIS — Z66 Do not resuscitate: Secondary | ICD-10-CM | POA: Diagnosis present

## 2024-03-01 DIAGNOSIS — N185 Chronic kidney disease, stage 5: Secondary | ICD-10-CM | POA: Diagnosis present

## 2024-03-01 DIAGNOSIS — E1169 Type 2 diabetes mellitus with other specified complication: Secondary | ICD-10-CM | POA: Diagnosis present

## 2024-03-01 DIAGNOSIS — J9601 Acute respiratory failure with hypoxia: Secondary | ICD-10-CM | POA: Diagnosis present

## 2024-03-01 DIAGNOSIS — A419 Sepsis, unspecified organism: Principal | ICD-10-CM | POA: Diagnosis present

## 2024-03-01 DIAGNOSIS — I10 Essential (primary) hypertension: Secondary | ICD-10-CM | POA: Diagnosis not present

## 2024-03-01 DIAGNOSIS — N390 Urinary tract infection, site not specified: Secondary | ICD-10-CM | POA: Diagnosis present

## 2024-03-01 DIAGNOSIS — I132 Hypertensive heart and chronic kidney disease with heart failure and with stage 5 chronic kidney disease, or end stage renal disease: Secondary | ICD-10-CM | POA: Diagnosis present

## 2024-03-01 DIAGNOSIS — N179 Acute kidney failure, unspecified: Secondary | ICD-10-CM | POA: Diagnosis present

## 2024-03-01 DIAGNOSIS — R34 Anuria and oliguria: Secondary | ICD-10-CM | POA: Diagnosis present

## 2024-03-01 DIAGNOSIS — Z880 Allergy status to penicillin: Secondary | ICD-10-CM

## 2024-03-01 DIAGNOSIS — I5033 Acute on chronic diastolic (congestive) heart failure: Secondary | ICD-10-CM | POA: Diagnosis present

## 2024-03-01 LAB — CBC WITH DIFFERENTIAL/PLATELET
Abs Immature Granulocytes: 0.01 K/uL (ref 0.00–0.07)
Basophils Absolute: 0 K/uL (ref 0.0–0.1)
Basophils Relative: 0 %
Eosinophils Absolute: 0 K/uL (ref 0.0–0.5)
Eosinophils Relative: 0 %
HCT: 30.9 % — ABNORMAL LOW (ref 36.0–46.0)
Hemoglobin: 10.3 g/dL — ABNORMAL LOW (ref 12.0–15.0)
Immature Granulocytes: 0 %
Lymphocytes Relative: 7 %
Lymphs Abs: 0.3 K/uL — ABNORMAL LOW (ref 0.7–4.0)
MCH: 30.7 pg (ref 26.0–34.0)
MCHC: 33.3 g/dL (ref 30.0–36.0)
MCV: 92 fL (ref 80.0–100.0)
Monocytes Absolute: 0.2 K/uL (ref 0.1–1.0)
Monocytes Relative: 4 %
Neutro Abs: 4.4 K/uL (ref 1.7–7.7)
Neutrophils Relative %: 89 %
Platelets: 208 K/uL (ref 150–400)
RBC: 3.36 MIL/uL — ABNORMAL LOW (ref 3.87–5.11)
RDW: 15.6 % — ABNORMAL HIGH (ref 11.5–15.5)
WBC: 4.9 K/uL (ref 4.0–10.5)
nRBC: 0 % (ref 0.0–0.2)

## 2024-03-01 LAB — URINALYSIS, ROUTINE W REFLEX MICROSCOPIC
Bilirubin Urine: NEGATIVE
Glucose, UA: >=500 mg/dL — AB
Ketones, ur: NEGATIVE mg/dL
Nitrite: NEGATIVE
Protein, ur: >=300 mg/dL — AB
Specific Gravity, Urine: 1.017 (ref 1.005–1.030)
WBC, UA: >50 WBC/hpf (ref 0–5)
pH: 6 (ref 5.0–8.0)

## 2024-03-01 LAB — BRAIN NATRIURETIC PEPTIDE: B Natriuretic Peptide: 352 pg/mL — ABNORMAL HIGH (ref 0.0–100.0)

## 2024-03-01 LAB — COMPREHENSIVE METABOLIC PANEL WITH GFR
ALT: 60 U/L — ABNORMAL HIGH (ref 0–44)
AST: 53 U/L — ABNORMAL HIGH (ref 15–41)
Albumin: 2.8 g/dL — ABNORMAL LOW (ref 3.5–5.0)
Alkaline Phosphatase: 62 U/L (ref 38–126)
Anion gap: 10 (ref 5–15)
BUN: 59 mg/dL — ABNORMAL HIGH (ref 8–23)
CO2: 23 mmol/L (ref 22–32)
Calcium: 8.6 mg/dL — ABNORMAL LOW (ref 8.9–10.3)
Chloride: 110 mmol/L (ref 98–111)
Creatinine, Ser: 5.03 mg/dL — ABNORMAL HIGH (ref 0.44–1.00)
GFR, Estimated: 8 mL/min — ABNORMAL LOW (ref >60)
Glucose, Bld: 217 mg/dL — ABNORMAL HIGH (ref 70–99)
Potassium: 3.1 mmol/L — ABNORMAL LOW (ref 3.5–5.1)
Sodium: 143 mmol/L (ref 135–145)
Total Bilirubin: <0.2 mg/dL (ref 0.0–1.2)
Total Protein: 6.2 g/dL — ABNORMAL LOW (ref 6.5–8.1)

## 2024-03-01 LAB — LACTIC ACID, PLASMA
Lactic Acid, Venous: 0.6 mmol/L (ref 0.5–1.9)
Lactic Acid, Venous: 0.5 mmol/L (ref 0.5–1.9)

## 2024-03-01 LAB — TROPONIN I (HIGH SENSITIVITY)
Troponin I (High Sensitivity): 3 ng/L (ref <18)
Troponin I (High Sensitivity): 3 ng/L (ref <18)

## 2024-03-01 LAB — GLUCOSE, CAPILLARY: Glucose-Capillary: 171 mg/dL — ABNORMAL HIGH (ref 70–99)

## 2024-03-01 LAB — TSH: TSH: 2.503 u[IU]/mL (ref 0.350–4.500)

## 2024-03-01 LAB — MAGNESIUM: Magnesium: 2.3 mg/dL (ref 1.7–2.4)

## 2024-03-01 MED ORDER — SODIUM CHLORIDE 0.9 % IV SOLN
2.0000 g | INTRAVENOUS | Status: DC
  Administered 2024-03-02 – 2024-03-03 (×2): 2 g via INTRAVENOUS
  Filled 2024-03-01 (×2): qty 20

## 2024-03-01 MED ORDER — AMLODIPINE BESYLATE 5 MG PO TABS: 10.0000 mg | ORAL_TABLET | Freq: Every day | ORAL | Status: DC

## 2024-03-01 MED ORDER — HYDRALAZINE HCL 50 MG PO TABS: 50.0000 mg | ORAL_TABLET | Freq: Three times a day (TID) | ORAL | Status: DC

## 2024-03-01 MED ORDER — SODIUM BICARBONATE 650 MG PO TABS: 1300.0000 mg | ORAL_TABLET | Freq: Three times a day (TID) | ORAL | Status: DC

## 2024-03-01 MED ORDER — CARVEDILOL 12.5 MG PO TABS: 12.5000 mg | ORAL_TABLET | Freq: Two times a day (BID) | ORAL | Status: DC

## 2024-03-01 MED ORDER — ATROPINE SULFATE 1 MG/10ML IJ SOSY
1.0000 mg | PREFILLED_SYRINGE | Freq: Once | INTRAMUSCULAR | Status: AC
  Administered 2024-03-01: 1 mg via INTRAVENOUS
  Filled 2024-03-01: qty 10

## 2024-03-01 MED ORDER — SODIUM CHLORIDE 0.9 % IV SOLN
1.0000 g | Freq: Once | INTRAVENOUS | Status: AC
  Administered 2024-03-01: 1 g via INTRAVENOUS
  Filled 2024-03-01: qty 10

## 2024-03-01 MED ORDER — OMEPRAZOLE MAGNESIUM 20 MG PO TBEC: 20.0000 mg | DELAYED_RELEASE_TABLET | Freq: Every morning | ORAL | Status: DC

## 2024-03-01 MED ORDER — CHLORHEXIDINE GLUCONATE CLOTH 2 % EX PADS
6.0000 | MEDICATED_PAD | Freq: Every day | CUTANEOUS | Status: DC
  Administered 2024-03-03 – 2024-03-05 (×3): 6 via TOPICAL

## 2024-03-01 MED ORDER — ACETAMINOPHEN 325 MG PO TABS: 650.0000 mg | ORAL_TABLET | Freq: Four times a day (QID) | ORAL | Status: DC | PRN

## 2024-03-01 MED ORDER — ISOSORBIDE MONONITRATE ER 30 MG PO TB24: 30.0000 mg | ORAL_TABLET | Freq: Every day | ORAL | Status: DC

## 2024-03-01 MED ORDER — ENOXAPARIN SODIUM 30 MG/0.3ML IJ SOSY
30.0000 mg | PREFILLED_SYRINGE | INTRAMUSCULAR | Status: DC
  Administered 2024-03-01: 30 mg via SUBCUTANEOUS
  Filled 2024-03-01: qty 0.3

## 2024-03-01 MED ORDER — ATROPINE SULFATE 1 MG/10ML IJ SOSY
1.0000 mg | PREFILLED_SYRINGE | Freq: Once | INTRAMUSCULAR | Status: DC
  Filled 2024-03-01: qty 10

## 2024-03-01 MED ORDER — ENOXAPARIN SODIUM 40 MG/0.4ML IJ SOSY: 40.0000 mg | PREFILLED_SYRINGE | INTRAMUSCULAR | Status: DC

## 2024-03-01 MED ORDER — ALBUTEROL SULFATE (2.5 MG/3ML) 0.083% IN NEBU: 2.5000 mg | INHALATION_SOLUTION | Freq: Four times a day (QID) | RESPIRATORY_TRACT | Status: DC | PRN

## 2024-03-01 MED ORDER — SODIUM CHLORIDE 0.9 % IV SOLN
500.0000 mg | Freq: Once | INTRAVENOUS | Status: AC
  Administered 2024-03-01: 500 mg via INTRAVENOUS
  Filled 2024-03-01: qty 5

## 2024-03-01 MED ORDER — ACETAMINOPHEN 650 MG RE SUPP: 650.0000 mg | Freq: Four times a day (QID) | RECTAL | Status: DC | PRN

## 2024-03-01 MED ORDER — PANTOPRAZOLE SODIUM 40 MG PO TBEC: 40.0000 mg | DELAYED_RELEASE_TABLET | Freq: Every day | ORAL | Status: DC

## 2024-03-01 MED ORDER — SODIUM CHLORIDE 0.9 % IV SOLN: 500.0000 mg | INTRAVENOUS | Status: DC

## 2024-03-01 MED ORDER — TORSEMIDE 20 MG PO TABS: 20.0000 mg | ORAL_TABLET | Freq: Every day | ORAL | Status: DC

## 2024-03-01 MED ORDER — SODIUM CHLORIDE 0.9 % IV BOLUS
1000.0000 mL | Freq: Once | INTRAVENOUS | Status: AC
  Administered 2024-03-01: 1000 mL via INTRAVENOUS

## 2024-03-01 MED ORDER — INSULIN ASPART 100 UNIT/ML IJ SOLN: 0.0000 [IU] | Freq: Three times a day (TID) | INTRAMUSCULAR | Status: DC

## 2024-03-01 MED ORDER — HYDROCOD POLI-CHLORPHE POLI ER 10-8 MG/5ML PO SUER: 5.0000 mL | Freq: Two times a day (BID) | ORAL | Status: DC | PRN

## 2024-03-01 MED ORDER — POTASSIUM CHLORIDE 10 MEQ/100ML IV SOLN
10.0000 meq | INTRAVENOUS | Status: AC
  Administered 2024-03-01 (×4): 10 meq via INTRAVENOUS
  Filled 2024-03-01 (×4): qty 100

## 2024-03-01 MED ORDER — ONDANSETRON HCL 4 MG PO TABS
4.0000 mg | ORAL_TABLET | Freq: Four times a day (QID) | ORAL | Status: DC | PRN
Start: 2024-03-01 — End: 2024-03-06

## 2024-03-01 MED ORDER — ONDANSETRON HCL 4 MG/2ML IJ SOLN: 4.0000 mg | Freq: Four times a day (QID) | INTRAMUSCULAR | Status: DC | PRN

## 2024-03-01 MED ORDER — EZETIMIBE 10 MG PO TABS
10.0000 mg | ORAL_TABLET | Freq: Every day | ORAL | Status: DC
Start: 2024-03-02 — End: 2024-03-02

## 2024-03-01 MED ORDER — GUAIFENESIN ER 600 MG PO TB12: 600.0000 mg | ORAL_TABLET | Freq: Two times a day (BID) | ORAL | Status: DC

## 2024-03-01 MED ORDER — INSULIN ASPART 100 UNIT/ML IJ SOLN: 0.0000 [IU] | Freq: Every day | INTRAMUSCULAR | Status: DC

## 2024-03-01 MED ORDER — FUROSEMIDE 10 MG/ML IJ SOLN
20.0000 mg | Freq: Two times a day (BID) | INTRAMUSCULAR | Status: DC
  Administered 2024-03-01 – 2024-03-02 (×2): 20 mg via INTRAVENOUS
  Filled 2024-03-01 (×2): qty 2

## 2024-03-01 MED ORDER — TRAZODONE HCL 50 MG PO TABS: 25.0000 mg | ORAL_TABLET | Freq: Every evening | ORAL | Status: DC | PRN

## 2024-03-01 MED ORDER — MAGNESIUM HYDROXIDE 400 MG/5ML PO SUSP: 30.0000 mL | Freq: Every day | ORAL | Status: DC | PRN

## 2024-03-01 MED ORDER — VITAMIN B-12 100 MCG PO TABS: 100.0000 ug | ORAL_TABLET | Freq: Every day | ORAL | Status: DC

## 2024-03-01 MED ORDER — FERROUS SULFATE 325 (65 FE) MG PO TABS: 325.0000 mg | ORAL_TABLET | Freq: Every day | ORAL | Status: DC

## 2024-03-01 NOTE — ED Notes (Signed)
EDP Zammit at bedside. 

## 2024-03-01 NOTE — ED Triage Notes (Signed)
 Pt bib rcems. CV called out w/ c/o AMS and concern for a UTI. Pt normally can hold a short conversation but is unable to communicate at this time. Facility reports pt has been bradycardiac for 2 days.

## 2024-03-01 NOTE — ED Notes (Signed)
 Pts sister Heron is at bedside

## 2024-03-01 NOTE — ED Provider Notes (Signed)
 [place] EMERGENCY DEPARTMENT AT [facility] Provider Note   CSN: [id number] Arrival date & time: 03/01/24  1811     Patient presents with: Altered Mental Status   Maria Parrish is a 78 y.o. female.   Patient is a 78 year old female with a past medical history of dementia who presents emergency department by EMS.  It is noted by family that she has been more altered today.  Patient is unable to provide her own history at this point but does appear to be altered on presentation and does appear to be hallucinating.  Patient denies any active pain throughout.  Family notes that they are concerned that she may have a urinary tract infection.   Altered Mental Status      Prior to Admission medications   Medication Sig Start Date End Date Taking? Authorizing Provider  albuterol  (VENTOLIN  HFA) 108 (90 Base) MCG/ACT inhaler Inhale 2 puffs into the lungs every 6 (six) hours as needed for wheezing or shortness of breath. 09/30/23  Yes Ricky Fines, MD  amLODipine  (NORVASC ) 10 MG tablet Take 1 tablet by mouth once daily 01/01/23  Yes Branch, Dorn FALCON, MD  carvedilol  (COREG ) 12.5 MG tablet Take 1 tablet by mouth twice daily Patient taking differently: Take 12.5 mg by mouth 2 (two) times daily with a meal. 04/11/23  Yes Branch, Dorn FALCON, MD  Cyanocobalamin  (VITAMIN B-12 PO) Take 1 tablet by mouth daily.   Yes [provider]  epoetin  alfa-epbx (RETACRIT ) 4000 UNIT/ML injection Inject 4,000 Units into the skin 2 (two) times a week. Friday   Yes [provider]  ezetimibe  (ZETIA ) 10 MG tablet Take 1 tablet (10 mg total) by mouth daily. 05/11/23  Yes Strader, Laymon CHRISTELLA, PA-C  ferrous sulfate  325 (65 FE) MG tablet Take 325 mg by mouth daily with breakfast.   Yes [provider]  hydrALAZINE  (APRESOLINE ) 100 MG tablet Take 0.5 tablets (50 mg total) by mouth 3 (three) times daily. 11/21/23  Yes Johnson, Clanford L, MD  insulin  aspart (NOVOLOG ) 100 UNIT/ML  injection Inject 0-9 Units into the skin 3 (three) times daily with meals. Patient taking differently: Inject 2-10 Units into the skin 3 (three) times daily with meals. SLIDING SCALE:  <70------= CALL PROVIDER 201-250= 2 UNITS 251-300= 4 UNITS 301-350= 6 UNITS 351-400= 8 UNITS 401-450= 10 UNITS >540----= CALL PROVIDER 02/02/24  Yes Bryn Bernardino NOVAK, MD  isosorbide  mononitrate (IMDUR ) 30 MG 24 hr tablet Take 1 tablet (30 mg total) by mouth daily. 12/29/20  Yes Nida, Gebreselassie W, MD  omeprazole  (PRILOSEC  OTC) 20 MG tablet Take 20 mg by mouth every morning.   Yes [provider]  sodium bicarbonate  650 MG tablet Take 2 tablets (1,300 mg total) by mouth 3 (three) times daily. 02/02/24  Yes Bryn Bernardino NOVAK, MD  torsemide  (DEMADEX ) 20 MG tablet Take 1 tablet (20 mg total) by mouth daily. 02/02/24  Yes Bryn Bernardino NOVAK, MD    Allergies: Statins and Penicillins    Review of Systems  Reason unable to perform ROS: Altered mental status.    Updated Vital Signs BP 138/73   Pulse 61   Temp (!) 92.8 F (33.8 C)   Resp (!) 26   SpO2 99%   Physical Exam Vitals and nursing note reviewed.  Constitutional:      Appearance: Normal appearance. She is ill-appearing.  HENT:     Head: Normocephalic and atraumatic.     Nose: Nose normal.     Mouth/Throat:  Mouth: Mucous membranes are moist.  Eyes:     Extraocular Movements: Extraocular movements intact.     Conjunctiva/sclera: Conjunctivae normal.     Pupils: Pupils are equal, round, and reactive to light.  Cardiovascular:     Rate and Rhythm: Regular rhythm. Bradycardia present.     Pulses: Normal pulses.     Heart sounds: Normal heart sounds. No murmur heard.    No gallop.  Pulmonary:     Effort: Pulmonary effort is normal. No respiratory distress.     Breath sounds: No stridor. Rales present. No wheezing or rhonchi.  Abdominal:     General: Abdomen is flat. Bowel sounds are normal. There is no distension.     Palpations: Abdomen is  soft.     Tenderness: There is no abdominal tenderness. There is no guarding.  Musculoskeletal:        General: Normal range of motion.     Cervical back: Normal range of motion and neck supple. No tenderness.     Right lower leg: No edema.     Left lower leg: No edema.  Skin:    General: Skin is warm and dry.     Coloration: Skin is not jaundiced.     Findings: No bruising or erythema.  Neurological:     Mental Status: She is alert. She is disoriented.     Comments: Unable to further assess neurologic status     (all labs ordered are listed, but only abnormal results are displayed) Labs Reviewed  COMPREHENSIVE METABOLIC PANEL WITH GFR - Abnormal; Notable for the following components:      Result Value   Potassium 3.1 (*)    Glucose, Bld 217 (*)    BUN 59 (*)    Creatinine, Ser 5.03 (*)    Calcium  8.6 (*)    Total Protein 6.2 (*)    Albumin 2.8 (*)    AST 53 (*)    ALT 60 (*)    GFR, Estimated 8 (*)    All other components within normal limits  CBC WITH DIFFERENTIAL/PLATELET - Abnormal; Notable for the following components:   RBC 3.36 (*)    Hemoglobin 10.3 (*)    HCT 30.9 (*)    RDW 15.6 (*)    Lymphs Abs 0.3 (*)    All other components within normal limits  URINALYSIS, ROUTINE W REFLEX MICROSCOPIC - Abnormal; Notable for the following components:   APPearance TURBID (*)    Glucose, UA >=500 (*)    Hgb urine dipstick SMALL (*)    Protein, ur >=300 (*)    Leukocytes,Ua LARGE (*)    Bacteria, UA RARE (*)    Non Squamous Epithelial 0-5 (*)    All other components within normal limits  BRAIN NATRIURETIC PEPTIDE - Abnormal; Notable for the following components:   B Natriuretic Peptide 352.0 (*)    All other components within normal limits  CULTURE, BLOOD (ROUTINE X 2)  URINE CULTURE  CULTURE, BLOOD (ROUTINE X 2)  LACTIC ACID, PLASMA  LACTIC ACID, PLASMA  TSH  MAGNESIUM   T4, FREE  PROTIME-INR  CORTISOL-AM, BLOOD  BASIC METABOLIC PANEL WITH GFR  CBC   HEMOGLOBIN A1C  TROPONIN I (HIGH SENSITIVITY)  TROPONIN I (HIGH SENSITIVITY)    EKG: None  Radiology: DG Chest Port 1 View Result Date: 03/01/2024 CLINICAL DATA:  Weakness, bradycardia EXAM: PORTABLE CHEST 1 VIEW COMPARISON:  01/28/2024 FINDINGS: Cardiomegaly, vascular congestion. Moderate left pleural effusion with left lower lobe atelectasis or infiltrate. No  overt edema. No acute bony abnormality. IMPRESSION: Cardiomegaly with vascular congestion. Left lower lobe atelectasis or infiltrate with moderate left effusion. Electronically Signed   By: Franky Crease M.D.   On: 03/01/2024 20:00   CT Head Wo Contrast Result Date: 03/01/2024 CLINICAL DATA:  Altered mental status. EXAM: CT HEAD WITHOUT CONTRAST TECHNIQUE: Contiguous axial images were obtained from the base of the skull through the vertex without intravenous contrast. RADIATION DOSE REDUCTION: This exam was performed according to the departmental dose-optimization program which includes automated exposure control, adjustment of the mA and/or kV according to patient size and/or use of iterative reconstruction technique. COMPARISON:  Head CT 01/28/2024 and MRI 09/22/2015 FINDINGS: Brain: There is no evidence of an acute infarct, intracranial hemorrhage, mass, midline shift, or extra-axial fluid collection. Confluent cerebral white matter hypodensities are similar to the prior CT and are nonspecific but compatible with extensive chronic small vessel ischemic disease. Ventriculomegaly is unchanged and out of proportion to the cerebral sulci which are only mildly enlarged, and there is an acute callosal angle. Vascular: Calcified atherosclerosis at the skull base. No hyperdense vessel. Skull: No acute fracture or suspicious lesion. Sinuses/Orbits: Visualized paranasal sinuses and mastoid air cells are clear. Unremarkable included orbits. Other: None. IMPRESSION: 1. No evidence of acute intracranial abnormality. 2. Extensive chronic small vessel  ischemic disease. 3. Unchanged ventriculomegaly which may reflect central predominant cerebral atrophy or normal pressure hydrocephalus in the appropriate clinical setting. Electronically Signed   By: Dasie Hamburg M.D.   On: 03/01/2024 19:37     .Critical Care  Performed by: Daralene Lonni BIRCH, PA-C Authorized by: Daralene Lonni BIRCH, PA-C   Critical care provider statement:    Critical care time (minutes):  35   Critical care was necessary to treat or prevent imminent or life-threatening deterioration of the following conditions:  CNS failure or compromise and sepsis   Critical care was time spent personally by me on the following activities:  Development of treatment plan with patient or surrogate, discussions with consultants, evaluation of patient's response to treatment, examination of patient, ordering and review of laboratory studies, ordering and review of radiographic studies, ordering and performing treatments and interventions, pulse oximetry, re-evaluation of patient's condition and review of old charts   I assumed direction of critical care for this patient from another provider in my specialty: no     Care discussed with: admitting provider      Medications Ordered in the ED  potassium chloride  10 mEq in 100 mL IVPB (10 mEq Intravenous New Bag/Given 03/01/24 2201)  amLODipine  (NORVASC ) tablet 10 mg (has no administration in time range)  carvedilol  (COREG ) tablet 12.5 mg (has no administration in time range)  ezetimibe  (ZETIA ) tablet 10 mg (has no administration in time range)  hydrALAZINE  (APRESOLINE ) tablet 50 mg (0 mg Oral Hold 03/01/24 2117)  isosorbide  mononitrate (IMDUR ) 24 hr tablet 30 mg (has no administration in time range)  sodium bicarbonate  tablet 1,300 mg (0 mg Oral Hold 03/01/24 2118)  vitamin B-12 (CYANOCOBALAMIN ) tablet 100 mcg (has no administration in time range)  ferrous sulfate  tablet 325 mg (has no administration in time range)  albuterol  (PROVENTIL ) (2.5  MG/3ML) 0.083% nebulizer solution 2.5 mg (has no administration in time range)  cefTRIAXone  (ROCEPHIN ) 2 g in sodium chloride  0.9 % 100 mL IVPB (has no administration in time range)  azithromycin  (ZITHROMAX ) 500 mg in sodium chloride  0.9 % 250 mL IVPB (has no administration in time range)  acetaminophen  (TYLENOL ) tablet 650 mg (has no  administration in time range)    Or  acetaminophen  (TYLENOL ) suppository 650 mg (has no administration in time range)  traZODone  (DESYREL ) tablet 25 mg (has no administration in time range)  ondansetron  (ZOFRAN ) tablet 4 mg (has no administration in time range)    Or  ondansetron  (ZOFRAN ) injection 4 mg (has no administration in time range)  magnesium  hydroxide (MILK OF MAGNESIA) suspension 30 mL (has no administration in time range)  enoxaparin  (LOVENOX ) injection 30 mg (30 mg Subcutaneous Given 03/01/24 2116)  pantoprazole  (PROTONIX ) EC tablet 40 mg (has no administration in time range)  furosemide  (LASIX ) injection 20 mg (20 mg Intravenous Given 03/01/24 2145)  guaiFENesin  (MUCINEX ) 12 hr tablet 600 mg (0 mg Oral Hold 03/01/24 2136)  chlorpheniramine-HYDROcodone (TUSSIONEX) 10-8 MG/5ML suspension 5 mL (has no administration in time range)  insulin  aspart (novoLOG ) injection 0-5 Units (has no administration in time range)  insulin  aspart (novoLOG ) injection 0-9 Units (has no administration in time range)  sodium chloride  0.9 % bolus 1,000 mL ( Intravenous Rate/Dose Change 03/01/24 2001)  cefTRIAXone  (ROCEPHIN ) 1 g in sodium chloride  0.9 % 100 mL IVPB (0 g Intravenous Stopped 03/01/24 2005)  atropine  1 MG/10ML injection 1 mg (1 mg Intravenous Given 03/01/24 1900)  azithromycin  (ZITHROMAX ) 500 mg in sodium chloride  0.9 % 250 mL IVPB (0 mg Intravenous Stopped 03/01/24 2231)                                    Medical Decision Making Amount and/or Complexity of Data Reviewed Labs: ordered. Radiology: ordered.  Risk Prescription drug management. Decision  regarding hospitalization.   This patient presents to the ED for concern of altered mental status, this involves an extensive number of treatment options, and is a complaint that carries with it a high risk of complications and morbidity.  The differential diagnosis includes sepsis, pneumonia, urinary tract infection, electrolyte derangement, acute kidney injury, dehydration, hypoxic respiratory failure, meningitis, encephalitis, CVA, TIA   Co morbidities that complicate the patient evaluation  Dementia   Additional history obtained:  Additional history obtained from EMS External records from outside source obtained and reviewed including medical records   Lab Tests:  I Ordered, and personally interpreted labs.  The pertinent results include: No leukocytosis, anemia at baseline, hypokalemia, elevated creatinine, elevated AST and ALT, elevated BNP, normal troponin, negative lactic acid, urinalysis with large leukocytes and rare bacteria   Imaging Studies ordered:  I ordered imaging studies including CT scan of head, chest x-ray I independently visualized and interpreted imaging which showed no acute intracranial process, pulmonary edema, left lower lobe infiltrate versus atelectasis I agree with the radiologist interpretation   Cardiac Monitoring: / EKG:  The patient was maintained on a cardiac monitor.  I personally viewed and interpreted the cardiac monitored which showed an underlying rhythm of: Sinus bradycardia, no ST/T wave changes, no ischemic changes, no STEMI   Consultations Obtained:  I requested consultation with the hospitalist,  and discussed lab and imaging findings as well as pertinent plan - they recommend: Admission   Problem List / ED Course / Critical interventions / Medication management  Patient does remain stable at this point but does remain critically ill.  She was hypothermic on initial presentation and currently has a Lawyer in place.   Bradycardia has improved.  She was initially given a dose of atropine .  There has been no associated hypotension.  She is currently  on 2 L nasal cannula.  Chest x-ray is concerning for pulmonary edema and possible infiltrate.  Did stop IV fluids.  She does appear to have a urinary tract infection as well and has been started on antibiotics.  Did add azithromycin  to cover for possible pneumonia as well.  CT scan of the head demonstrated no signs of acute process.  TSH is within normal limits with low suspicion for myxedema crisis at this point.  Potassium repletion was started in the emergency department.  Did discuss patient case with Dr. Lawence with the hospital service who has excepted her for admission. I ordered medication including Rocephin , azithromycin , atropine , potassium, Lasix  for sepsis, possible pneumonia, urinary tract infection, bradycardia, hypokalemia, pulmonary edema Reevaluation of the patient after these medicines showed that the patient improved I have reviewed the patients home medicines and have made adjustments as needed   Social Determinants of Health:  None   Test / Admission - Considered:  Admission     Final diagnoses:  Altered mental status, unspecified altered mental status type  Metabolic encephalopathy  Chronic kidney disease, unspecified CKD stage  Hypokalemia  Acute pulmonary edema [facility])    ED Discharge Orders     None          Daralene Lonni JONETTA DEVONNA 03/01/24 2240    Suzette Pac, MD 03/02/24 1000

## 2024-03-01 NOTE — H&P (Addendum)
 Acute on chronic back     [place]   PATIENT NAME: Maria Parrish    MR#:  [id number]  DATE OF BIRTH:  08-30-1945  DATE OF ADMISSION:  03/01/2024  PRIMARY CARE PHYSICIAN: Margarete Maeola DASEN, FNP   Patient is coming from: [facility]  REQUESTING/REFERRING PHYSICIAN: Daralene Lonni BIRCH PA-C  CHIEF COMPLAINT:   Chief Complaint  Patient presents with   Altered Mental Status    HISTORY OF PRESENT ILLNESS:  Maria Parrish is a 78 y.o. African-American female with medical history significant for dementia, type 2 diabetes mellitus, with stage V CKD, hypertension, dyslipidemia, diabetic neuropathy and diastolic CHF, presented to the emergency room with acute onset of altered mental status with inability to communicate unlike her baseline.She was found to be hypothermic in the ER.  She was also bradycardic and received IV atropine .  The patient is no historian due to her dementia.  History obtained from her family earlier revealed altered mental status with confusion and hallucinations.  She denied any pain.  No reported chest pain or palpitations.  No reported cough or wheezing or dyspnea.  There was a concern about UTI with the family.  No fever or chills.  She was admitted here on 7/14 last month for acute on chronic kidney disease and acute metabolic cephalopathy as well as lobar pneumonia, hypokalemia and metabolic acidosis and L1 compression fracture and discharged within 5 days after clinical improvement..  ED Course: When she came to the ER temperature was 89.8/32.1, BP 149/83 and heart rate 52 with otherwise normal vitals.  Later on respiratory rate was up to 24.  Labs revealed hypokalemia of 3.1 and blood glucose 217, BUN of 59 creatinine 5.03, AST 53 and ALT 60 with total bili of 6.2.  BNP was 352 and high-sensitivity troponin I was 3 twice.  CBC showed anemia better than previous levels.  TSH was 2.5.  UA was positive for UTI EKG as reviewed by me : EKG showed sinus bradycardia with  a rate of 38 with low voltage QRS and poor R wave progression with prolonged QT interval with QTc of 532 MS. Imaging: Portable chest x-ray showed the following: Cardiomegaly with vascular congestion.   Left lower lobe atelectasis or infiltrate with moderate left effusion.  The patient was placed on Bair hugger for hyponatremia and was given 1 L bolus of IV normal saline, 1 g of IV Rocephin , 1 g of IV atropine , 10 mg of IV potassium chloride  and will be admitted to a stepdown unit bed for further evaluation and management.  PAST MEDICAL HISTORY:   Past Medical History:  Diagnosis Date   Assistance needed for mobility    walks with walker   Confusion    Dementia (HCC)    Diabetes mellitus without complication (HCC)    Generalized weakness    Heart failure with improved ejection fraction (HFimpEF) (HCC)    a. EF 40-45% by echo in 04/2020 b. normalized to 55-60% by echo in 06/2021   Hypercalcemia    Hyperlipidemia    Hyperparathyroidism (HCC)    Hypertension    Hypertension    Moderate protein-calorie malnutrition (HCC)    Peripheral neuropathy    UTI (lower urinary tract infection)     PAST SURGICAL HISTORY:   Past Surgical History:  Procedure Laterality Date   COLONOSCOPY N/A 08/12/2015   Procedure: COLONOSCOPY;  Surgeon: Lamar CHRISTELLA Hollingshead, MD;  [facility]: [facility];  Service: Endoscopy;  Laterality: N/A;  1245pm  ESOPHAGOGASTRODUODENOSCOPY N/A 08/12/2015   Procedure: ESOPHAGOGASTRODUODENOSCOPY (EGD);  Surgeon: Lamar CHRISTELLA Hollingshead, MD;  Location: [facility];  Service: Endoscopy;  Laterality: N/A;   none      SOCIAL HISTORY:   Social History   Tobacco Use   Smoking status: Never   Smokeless tobacco: Never  Substance Use Topics   Alcohol  use: No    FAMILY HISTORY:   Family History  Problem Relation Age of Onset   Heart attack Mother    Diabetes Father    Hypertension Brother    Hypertension Brother    Diabetes Brother    Colon cancer Neg Hx     DRUG  ALLERGIES:   Allergies  Allergen Reactions   Statins Other (See Comments)    Makes wrists and fingers ache Muscle Pain and Joint Pain   Penicillins Other (See Comments)    Makes patient sore.    REVIEW OF SYSTEMS:   ROS As per history of present illness. All pertinent systems were reviewed above. Constitutional, HEENT, cardiovascular, respiratory, GI, GU, musculoskeletal, neuro, psychiatric, endocrine, integumentary and hematologic systems were reviewed and are otherwise negative/unremarkable except for positive findings mentioned above in the HPI.   MEDICATIONS AT HOME:   Prior to Admission medications   Medication Sig Start Date End Date Taking? Authorizing Provider  albuterol  (VENTOLIN  HFA) 108 (90 Base) MCG/ACT inhaler Inhale 2 puffs into the lungs every 6 (six) hours as needed for wheezing or shortness of breath. 09/30/23  Yes Ricky Fines, MD  amLODipine  (NORVASC ) 10 MG tablet Take 1 tablet by mouth once daily 01/01/23  Yes Branch, Dorn FALCON, MD  carvedilol  (COREG ) 12.5 MG tablet Take 1 tablet by mouth twice daily Patient taking differently: Take 12.5 mg by mouth 2 (two) times daily with a meal. 04/11/23  Yes Branch, Dorn FALCON, MD  Cyanocobalamin  (VITAMIN B-12 PO) Take 1 tablet by mouth daily.   Yes [provider]  epoetin  alfa-epbx (RETACRIT ) 4000 UNIT/ML injection Inject 4,000 Units into the skin 2 (two) times a week. Friday   Yes [provider]  ezetimibe  (ZETIA ) 10 MG tablet Take 1 tablet (10 mg total) by mouth daily. 05/11/23  Yes Strader, Laymon CHRISTELLA, PA-C  ferrous sulfate  325 (65 FE) MG tablet Take 325 mg by mouth daily with breakfast.   Yes [provider]  hydrALAZINE  (APRESOLINE ) 100 MG tablet Take 0.5 tablets (50 mg total) by mouth 3 (three) times daily. 11/21/23  Yes Johnson, Clanford L, MD  insulin  aspart (NOVOLOG ) 100 UNIT/ML injection Inject 0-9 Units into the skin 3 (three) times daily with meals. Patient taking differently: Inject 2-10  Units into the skin 3 (three) times daily with meals. SLIDING SCALE:  <70------= CALL PROVIDER 201-250= 2 UNITS 251-300= 4 UNITS 301-350= 6 UNITS 351-400= 8 UNITS 401-450= 10 UNITS >540----= CALL PROVIDER 02/02/24  Yes Bryn Bernardino NOVAK, MD  isosorbide  mononitrate (IMDUR ) 30 MG 24 hr tablet Take 1 tablet (30 mg total) by mouth daily. 12/29/20  Yes Nida, Ethelle ORN, MD  omeprazole  (PRILOSEC  OTC) 20 MG tablet Take 20 mg by mouth every morning.   Yes [provider]  sodium bicarbonate  650 MG tablet Take 2 tablets (1,300 mg total) by mouth 3 (three) times daily. 02/02/24  Yes Bryn Bernardino NOVAK, MD  torsemide  (DEMADEX ) 20 MG tablet Take 1 tablet (20 mg total) by mouth daily. 02/02/24  Yes Bryn Bernardino NOVAK, MD      VITAL SIGNS:  Blood pressure (!) 170/75, pulse (!) 59, temperature (!) 95.9  F (35.5 C), temperature source Bladder, resp. rate (!) 32, height 5' 4 (1.626 m), weight 78.7 kg, SpO2 96%.  PHYSICAL EXAMINATION:  Physical Exam  GENERAL:  78 y.o.-year-old African-American female patient lying in the bed with no acute distress.  EYES: Pupils equal, round, reactive to light and accommodation. No scleral icterus. Extraocular muscles intact.  HEENT: Head atraumatic, normocephalic. Oropharynx and nasopharynx clear.  NECK:  Supple, no jugular venous distention. No thyroid  enlargement, no tenderness.  LUNGS: Diminished bibasal breath sounds with left basal crackles.  No use of accessory muscles of respiration.  CARDIOVASCULAR: Regular rate and rhythm, S1, S2 normal. No murmurs, rubs, or gallops.  ABDOMEN: Soft, nondistended, nontender. Bowel sounds present. No organomegaly or mass.  EXTREMITIES: No pedal edema, cyanosis, or clubbing.  NEUROLOGIC: Cranial nerves II through XII are intact. Muscle strength 5/5 in all extremities. Sensation intact. Gait not checked.  PSYCHIATRIC: The patient is alert and oriented x 3.  Normal affect and good eye contact. SKIN: No obvious rash, lesion, or ulcer.    LABORATORY PANEL:   CBC Recent Labs  Lab 03/01/24 1855  WBC 4.9  HGB 10.3*  HCT 30.9*  PLT 208   ------------------------------------------------------------------------------------------------------------------  Chemistries  Recent Labs  Lab 03/01/24 1855 03/01/24 1909  NA 143  --   K 3.1*  --   CL 110  --   CO2 23  --   GLUCOSE 217*  --   BUN 59*  --   CREATININE 5.03*  --   CALCIUM  8.6*  --   MG  --  2.3  AST 53*  --   ALT 60*  --   ALKPHOS 62  --   BILITOT <0.2  --    ------------------------------------------------------------------------------------------------------------------  Cardiac Enzymes No results for input(s): TROPONINI in the last 168 hours. ------------------------------------------------------------------------------------------------------------------  RADIOLOGY:  DG Chest Port 1 View Result Date: 03/01/2024 CLINICAL DATA:  Weakness, bradycardia EXAM: PORTABLE CHEST 1 VIEW COMPARISON:  01/28/2024 FINDINGS: Cardiomegaly, vascular congestion. Moderate left pleural effusion with left lower lobe atelectasis or infiltrate. No overt edema. No acute bony abnormality. IMPRESSION: Cardiomegaly with vascular congestion. Left lower lobe atelectasis or infiltrate with moderate left effusion. Electronically Signed   By: Franky Crease M.D.   On: 03/01/2024 20:00   CT Head Wo Contrast Result Date: 03/01/2024 CLINICAL DATA:  Altered mental status. EXAM: CT HEAD WITHOUT CONTRAST TECHNIQUE: Contiguous axial images were obtained from the base of the skull through the vertex without intravenous contrast. RADIATION DOSE REDUCTION: This exam was performed according to the departmental dose-optimization program which includes automated exposure control, adjustment of the mA and/or kV according to patient size and/or use of iterative reconstruction technique. COMPARISON:  Head CT 01/28/2024 and MRI 09/22/2015 FINDINGS: Brain: There is no evidence of an acute infarct,  intracranial hemorrhage, mass, midline shift, or extra-axial fluid collection. Confluent cerebral white matter hypodensities are similar to the prior CT and are nonspecific but compatible with extensive chronic small vessel ischemic disease. Ventriculomegaly is unchanged and out of proportion to the cerebral sulci which are only mildly enlarged, and there is an acute callosal angle. Vascular: Calcified atherosclerosis at the skull base. No hyperdense vessel. Skull: No acute fracture or suspicious lesion. Sinuses/Orbits: Visualized paranasal sinuses and mastoid air cells are clear. Unremarkable included orbits. Other: None. IMPRESSION: 1. No evidence of acute intracranial abnormality. 2. Extensive chronic small vessel ischemic disease. 3. Unchanged ventriculomegaly which may reflect central predominant cerebral atrophy or normal pressure hydrocephalus in the appropriate clinical setting. Electronically Signed  By: Dasie Hamburg M.D.   On: 03/01/2024 19:37      IMPRESSION AND PLAN:  Assessment and Plan: * Hypothermia - This could be early SIRS with associated acute lower UTI and possible community-acquired pneumonia as she did with sepsis. - The patient will be admitted to a stepdown unit bed. - She will be continued on Bair hugger. - Will continue about therapy on broad-spectrum basis with IV aztreonam, vancomycin and Flagyl.. - Will follow blood and urine cultures. - The patient had a marked sinus bradycardia and therefore TSH was checked and came back normal.  This has resolved after IV atropine .  Acute on chronic diastolic CHF (congestive heart failure) (HCC) - The patient will be placed on gentle diuresis with IV Lasix  to replace Demadex . - Will follow ins and outs. - This could be related to fluid overload with her advanced chronic kidney disease. - Continue Imdur  and Coreg  as well as hydralazine .  Hypokalemia - Will replace potassium.  Magnesium  level normal.  Type 2 diabetes mellitus  with chronic kidney disease, with long-term current use of insulin  (HCC) - This is associated with stage V chronic kidney disease. - The patient will be placed on supplemental coverage with NovoLog .  Essential hypertension, benign - Will continue antihypertensive therapy.  GERD without esophagitis - Will continue PPI therapy.  Dyslipidemia - Will continue Zetia .   DVT prophylaxis: Lovenox .  Advanced Care Planning:  Code Status: She is DNR and DNI. Family Communication:  The plan of care was discussed in details with the patient (and family). I answered all questions. The patient agreed to proceed with the above mentioned plan. Further management will depend upon hospital course. Disposition Plan: Back to previous home environment Consults called: none.  All the records are reviewed and case discussed with ED provider.  Status is: Inpatient  At the time of the admission, it appears that the appropriate admission status for this patient is inpatient.  This is judged to be reasonable and necessary in order to provide the required intensity of service to ensure the patient's safety given the presenting symptoms, physical exam findings and initial radiographic and laboratory data in the context of comorbid conditions.  The patient requires inpatient status due to high intensity of service, high risk of further deterioration and high frequency of surveillance required.  I certify that at the time of admission, it is my clinical judgment that the patient will require inpatient hospital care extending more than 2 midnights.                            Dispo: The patient is from: [facility]              Anticipated d/c is to: [facility]              Patient currently is not medically stable to d/c.              Difficult to place patient: No  Madison DELENA Peaches M.D on 03/02/2024 at 3:19 AM  [facility]   From 7 PM-7 AM, contact night-coverage [email or web address]  CC: Primary  care physician; Margarete Maeola DASEN, FNP

## 2024-03-01 NOTE — ED Notes (Signed)
 Admitting provider Mansy at bedside. Updated on pts oxygenation needs. Has been on 2 L nasal cannula with SPO2 92-95%. Pt incomprehensible speech. Not following commands. Confirmed RN able to hold ordered PO meds at this time. Admitting to order alternative medication. Orders pending.

## 2024-03-01 NOTE — Progress Notes (Signed)
 PHARMACIST - PHYSICIAN COMMUNICATION  CONCERNING:  Enoxaparin  (Lovenox ) for DVT Prophylaxis    RECOMMENDATION: Patient was prescribed enoxaprin 40mg  q24 hours for VTE prophylaxis.   There were no vitals filed for this visit.  There is no height or weight on file to calculate BMI.  CrCl cannot be calculated (Unknown ideal weight.).  Patient is candidate for enoxaparin  30mg  every 24 hours based on CrCl <32ml/min or Weight <45kg  DESCRIPTION: Pharmacy has adjusted enoxaparin  dose per [facility] policy.  Patient is now receiving enoxaparin  30 mg every 24 hours    Olam KANDICE Fritter, PharmD Clinical Pharmacist  03/01/2024 9:06 PM

## 2024-03-02 DIAGNOSIS — K219 Gastro-esophageal reflux disease without esophagitis: Secondary | ICD-10-CM | POA: Diagnosis present

## 2024-03-02 DIAGNOSIS — N185 Chronic kidney disease, stage 5: Secondary | ICD-10-CM | POA: Diagnosis present

## 2024-03-02 DIAGNOSIS — F0390 Unspecified dementia without behavioral disturbance: Secondary | ICD-10-CM

## 2024-03-02 DIAGNOSIS — I10 Essential (primary) hypertension: Secondary | ICD-10-CM | POA: Diagnosis not present

## 2024-03-02 DIAGNOSIS — E1169 Type 2 diabetes mellitus with other specified complication: Secondary | ICD-10-CM | POA: Diagnosis present

## 2024-03-02 DIAGNOSIS — A419 Sepsis, unspecified organism: Secondary | ICD-10-CM | POA: Diagnosis not present

## 2024-03-02 DIAGNOSIS — E440 Moderate protein-calorie malnutrition: Secondary | ICD-10-CM

## 2024-03-02 DIAGNOSIS — I5033 Acute on chronic diastolic (congestive) heart failure: Secondary | ICD-10-CM | POA: Diagnosis present

## 2024-03-02 DIAGNOSIS — E785 Hyperlipidemia, unspecified: Secondary | ICD-10-CM | POA: Insufficient documentation

## 2024-03-02 LAB — CBC
HCT: 30.2 % — ABNORMAL LOW (ref 36.0–46.0)
Hemoglobin: 10 g/dL — ABNORMAL LOW (ref 12.0–15.0)
MCH: 30.6 pg (ref 26.0–34.0)
MCHC: 33.1 g/dL (ref 30.0–36.0)
MCV: 92.4 fL (ref 80.0–100.0)
Platelets: 246 K/uL (ref 150–400)
RBC: 3.27 MIL/uL — ABNORMAL LOW (ref 3.87–5.11)
RDW: 15.9 % — ABNORMAL HIGH (ref 11.5–15.5)
WBC: 6.7 K/uL (ref 4.0–10.5)
nRBC: 0 % (ref 0.0–0.2)

## 2024-03-02 LAB — HEMOGLOBIN A1C
Hgb A1c MFr Bld: 7 % — ABNORMAL HIGH (ref 4.8–5.6)
Mean Plasma Glucose: 154.2 mg/dL

## 2024-03-02 LAB — PROTIME-INR
INR: 1.1 (ref 0.8–1.2)
Prothrombin Time: 14.9 s (ref 11.4–15.2)

## 2024-03-02 LAB — CORTISOL-AM, BLOOD: Cortisol - AM: 29.8 ug/dL — ABNORMAL HIGH (ref 6.7–22.6)

## 2024-03-02 LAB — BASIC METABOLIC PANEL WITH GFR
Anion gap: 8 (ref 5–15)
BUN: 58 mg/dL — ABNORMAL HIGH (ref 8–23)
CO2: 23 mmol/L (ref 22–32)
Calcium: 8.7 mg/dL — ABNORMAL LOW (ref 8.9–10.3)
Chloride: 113 mmol/L — ABNORMAL HIGH (ref 98–111)
Creatinine, Ser: 5.02 mg/dL — ABNORMAL HIGH (ref 0.44–1.00)
GFR, Estimated: 8 mL/min — ABNORMAL LOW (ref >60)
Glucose, Bld: 109 mg/dL — ABNORMAL HIGH (ref 70–99)
Potassium: 3.8 mmol/L (ref 3.5–5.1)
Sodium: 144 mmol/L (ref 135–145)

## 2024-03-02 LAB — MRSA NEXT GEN BY PCR, NASAL: MRSA by PCR Next Gen: NOT DETECTED

## 2024-03-02 LAB — GLUCOSE, CAPILLARY
Glucose-Capillary: 101 mg/dL — ABNORMAL HIGH (ref 70–99)
Glucose-Capillary: 90 mg/dL (ref 70–99)
Glucose-Capillary: 99 mg/dL (ref 70–99)

## 2024-03-02 LAB — T4, FREE: Free T4: 0.94 ng/dL (ref 0.61–1.12)

## 2024-03-02 MED ORDER — ORAL CARE MOUTH RINSE
15.0000 mL | OROMUCOSAL | Status: DC
  Administered 2024-03-02 – 2024-03-06 (×14): 15 mL via OROMUCOSAL

## 2024-03-02 MED ORDER — ORAL CARE MOUTH RINSE: 15.0000 mL | OROMUCOSAL | Status: DC | PRN

## 2024-03-02 MED ORDER — FUROSEMIDE 10 MG/ML IJ SOLN
80.0000 mg | Freq: Two times a day (BID) | INTRAMUSCULAR | Status: DC
  Administered 2024-03-02 – 2024-03-03 (×3): 80 mg via INTRAVENOUS
  Filled 2024-03-02 (×3): qty 8

## 2024-03-02 MED ORDER — HYDRALAZINE HCL 20 MG/ML IJ SOLN
10.0000 mg | Freq: Four times a day (QID) | INTRAMUSCULAR | Status: DC | PRN
  Administered 2024-03-02: 10 mg via INTRAVENOUS
  Filled 2024-03-02: qty 1

## 2024-03-02 MED ORDER — PANTOPRAZOLE SODIUM 40 MG IV SOLR
40.0000 mg | INTRAVENOUS | Status: DC
  Administered 2024-03-02: 40 mg via INTRAVENOUS
  Filled 2024-03-02: qty 10

## 2024-03-02 MED ORDER — FUROSEMIDE 10 MG/ML IJ SOLN
20.0000 mg | Freq: Once | INTRAMUSCULAR | Status: AC
  Administered 2024-03-02: 20 mg via INTRAVENOUS
  Filled 2024-03-02: qty 2

## 2024-03-02 MED ORDER — POTASSIUM CHLORIDE 20 MEQ PO PACK: 40.0000 meq | PACK | Freq: Once | ORAL | Status: DC

## 2024-03-02 MED ORDER — HYDRALAZINE HCL 20 MG/ML IJ SOLN
10.0000 mg | Freq: Four times a day (QID) | INTRAMUSCULAR | Status: DC | PRN
  Administered 2024-03-03: 10 mg via INTRAVENOUS
  Filled 2024-03-02: qty 1

## 2024-03-02 NOTE — Assessment & Plan Note (Signed)
 Very poor prognosis  Not able to take po due to cognitive impairment.

## 2024-03-02 NOTE — Assessment & Plan Note (Addendum)
 Severe sepsis (present on admission)  Acute metabolic encephalopathy   Systolic blood pressure 160 mmHg range.  Continue poorly responsive.  Wbc is 6,7 pending cultures results.   Plan to continue IV antibiotic therapy with ceftriaxone .  Follow up cell count, temperature curve and cultures.  Continue neuro checks per unit protocol

## 2024-03-02 NOTE — Progress Notes (Addendum)
 Progress Note   Patient: Maria Parrish FMW:[id number] DOB: [birth year] DOA: 03/01/2024     1 DOS: the patient was seen and examined on 03/02/2024   Brief hospital course: Mrs. Mcaffee was admitted to the hospital with the working diagnosis of sepsis due to urinary tract infection.   78 yo female with the past medical history of dementia, T2DM, CKD, hypertension, heart failure and dyslipidemia who presented with altered mental status.  Recent hospitalization 07/14 to 02/02/24 for pneumonia, complicated with acute renal failure and encephalopathy. She was discharged to SNF, to continue with palliative/ hospice care due to progressive renal failure.  She was noted to have change in her mentation, to the point where she was not interactive, EMS was called and patient was brought to the hospital.  On her initial physical examination her blood pressure was 170/75, HR 59, RR 32 and 02 saturation 96% Lungs with decreased breath sounds bilaterally with rales at the left base, no wheezing or rhonchi, heart with S1 and S2 present and regular, abdomen not distended and not tender, no lower extremity edema.   Na 143, K 3.1 CL 110, bicarbonate 23, glucose 217, bun 59 cr 5.0  Mg 2.3  AST 53 ALT 60  BNP 352  High sensitive troponin 3  Lactic acid 0,6  Wbc 4.9 hgb 10.3 plt 208  TSH 2,5   Urine analysis SG 1.017, protein > 300, large leukocytes, small hgb, > 500 glucose, wbc >50   CT with no acute intracranial abnormality.  Extensive chronic small vessel ischemic disease.  Unchanged ventriculomegaly which may reflect central predominant cerebral atrophy or normal pressure hydrocephalus.   Chest radiograph with left rotation, mild cardiomegaly, bilateral hilar vascular congestion, small left pleural effusion with atelectasis.   EKG 38 bpm, normal axis, qtc 532, sinus rhythm with low voltage, no significant ST segment or T wave changes.    Assessment and Plan: * Sepsis secondary to UTI (HCC) Severe sepsis  (present on admission)  Acute metabolic encephalopathy   Systolic blood pressure 160 mmHg range.  Continue poorly responsive.  Wbc is 6,7 pending cultures results.   Plan to continue IV antibiotic therapy with ceftriaxone .  Follow up cell count, temperature curve and cultures.  Continue neuro checks per unit protocol   Acute on chronic diastolic CHF (congestive heart failure) (HCC) 09/2023 echocardiogram with preserved LV systolic function EF 55 to 60%, mild LVH, RV systolic function preserved, RVSP 37.6 mmHg, LA with mild dilatation, mitral valve with mild regurgitation,   Positive signs of hypervolemia   Plan to increase furosemide  to 80 mg IV bid.   Acute hypoxemic respiratory failure due to acute pulmonary edema,  Today with increased 02 requirements to 6 L/min .  Continue diuresis as tolerated.   Essential hypertension Patient not tolerating po meds due to altered cognition.  Will use as needed hydralazine  for blood pressure systolic 190 mmHg or greater.  Continue blood pressure monitoring   Chronic kidney disease, stage 5 (HCC) AKI, oliguric.   Renal function with serum cr at 5,0 with K at 3,8 and serum bicarbonate at 23  Na 144  Documented urine output 70 cc (foley)   Plan to continue diuresis with furosemide  80 mg IV bid Patient not candidate for renal replacement therapy.  Very poor prognosis.   Type 2 diabetes mellitus with hyperlipidemia (HCC) Fasting glucose 109 mg/dl  Hold on insulin  therapy due to risk of hypoglycemia  Not on statin, patient can not tolerate po medications.   Dementia (  HCC) Advance dementia  Continue supportive care Poor prognosis   GERD without esophagitis Pantoprazole  change to IV from po.   Malnutrition of moderate degree (HCC) Very poor prognosis  Not able to take po due to cognitive impairment.    Subjective: Patient is not interactive.   Physical Exam: Vitals:   03/02/24 0300 03/02/24 0400 03/02/24 0500 03/02/24 0551   BP: (!) 171/70 (!) 177/78 (!) 163/70   Pulse: 63 71 73   Resp: (!) 30 (!) 32 (!) 42   Temp: (!) 97.3 F (36.3 C) 97.9 F (36.6 C) 98.2 F (36.8 C) 98.6 F (37 C)  TempSrc: Bladder Bladder Bladder Bladder  SpO2: 92% 92% 94%   Weight:      Height:       Neurology eyes closed not responding to touch or voice  ENT with mild pallor with no icterus Cardiovascular with S1 and S2 present and regular with no gallops, or rubs Respiratory with bilateral rales with poor inspiratory effort, no wheezing Abdomen with mild distention, non tender to palpation, soft Positive lower extremity edema +++   Data Reviewed:    Family Communication: I spoke with patient's sister at the bedside, we talked in detail about patient's condition, plan of care and prognosis and all questions were addressed. Explained poor prognosis and high risk of worsening, patient's sons will come to the hospital to further discuss goals of care.  She will benefit from comfort care.   Disposition: Status is: Inpatient Remains inpatient appropriate because: IV diuresis and IV antibiotics   Planned Discharge Destination: Skilled nursing facility     Author: Elidia Toribio Furnace, MD 03/02/2024 8:37 AM  For on call review [email or web address].

## 2024-03-02 NOTE — Assessment & Plan Note (Addendum)
-   This is associated with stage V chronic kidney disease. - The patient will be placed on supplemental coverage with NovoLog .

## 2024-03-02 NOTE — Assessment & Plan Note (Deleted)
-   Will replace potassium.  Magnesium  level normal.

## 2024-03-02 NOTE — Assessment & Plan Note (Deleted)
-   This could be early SIRS with associated acute lower UTI and possible community-acquired pneumonia as she did with sepsis. - The patient will be admitted to a stepdown unit bed. - She will be continued on Bair hugger. - Will continue about therapy on broad-spectrum basis with IV aztreonam, vancomycin and Flagyl.. - Will follow blood and urine cultures. - The patient had a marked sinus bradycardia and therefore TSH was checked and came back normal.  This has resolved after IV atropine .

## 2024-03-02 NOTE — Assessment & Plan Note (Addendum)
 Patient with no po intake.  Hold on insulin  therapy, transitioned to comfort care

## 2024-03-02 NOTE — Assessment & Plan Note (Deleted)
Will continue Zetia.

## 2024-03-02 NOTE — Assessment & Plan Note (Signed)
 Advance dementia  Continue supportive care Poor prognosis

## 2024-03-02 NOTE — Assessment & Plan Note (Addendum)
 Transitioned to comfort care

## 2024-03-02 NOTE — Assessment & Plan Note (Addendum)
 09/2023 echocardiogram with preserved LV systolic function EF 55 to 60%, mild LVH, RV systolic function preserved, RVSP 37.6 mmHg, LA with mild dilatation, mitral valve with mild regurgitation,   Positive signs of hypervolemia  Not responding to high doses of furosemide  IV   Acute hypoxemic respiratory failure due to acute pulmonary edema,  Transitioned to comfort care

## 2024-03-02 NOTE — Hospital Course (Signed)
 Maria Parrish was admitted to the hospital with the working diagnosis of sepsis due to urinary tract infection.   78 yo female with the past medical history of dementia, T2DM, CKD, hypertension, heart failure and dyslipidemia who presented with altered mental status.  Recent hospitalization 07/14 to 02/02/24 for pneumonia, complicated with acute renal failure and encephalopathy. She was discharged to SNF, to continue with palliative/ hospice care due to progressive renal failure.  She was noted to have change in her mentation, to the point where she was not interactive, EMS was called and patient was brought to the hospital.  On her initial physical examination her blood pressure was 170/75, HR 59, RR 32 and 02 saturation 96% Lungs with decreased breath sounds bilaterally with rales at the left base, no wheezing or rhonchi, heart with S1 and S2 present and regular, abdomen not distended and not tender, no lower extremity edema.   Na 143, K 3.1 CL 110, bicarbonate 23, glucose 217, bun 59 cr 5.0  Mg 2.3  AST 53 ALT 60  BNP 352  High sensitive troponin 3  Lactic acid 0,6  Wbc 4.9 hgb 10.3 plt 208  TSH 2,5   Urine analysis SG 1.017, protein > 300, large leukocytes, small hgb, > 500 glucose, wbc >50   CT with no acute intracranial abnormality.  Extensive chronic small vessel ischemic disease.  Unchanged ventriculomegaly which may reflect central predominant cerebral atrophy or normal pressure hydrocephalus.   Chest radiograph with left rotation, mild cardiomegaly, bilateral hilar vascular congestion, small left pleural effusion with atelectasis.   EKG 38 bpm, normal axis, qtc 532, sinus rhythm with low voltage, no significant ST segment or T wave changes.   08/ 18 patient with no meaningful improvement , after IV antibiotics and IV diuresis.  Continue to have very poor prognosis in the setting of advanced dementia, patient has been not responsive, not able to eat.  Palliative consulted and  patient's family has decided to continue care under comfort care.

## 2024-03-02 NOTE — Assessment & Plan Note (Addendum)
 Patient not tolerating po meds due to altered cognition.  Will use as needed hydralazine  for blood pressure systolic 190 mmHg or greater.  Continue blood pressure monitoring

## 2024-03-02 NOTE — Assessment & Plan Note (Signed)
 AKI, oliguric.   Renal function with serum cr at 5,0 with K at 3,8 and serum bicarbonate at 23  Na 144  Documented urine output 70 cc (foley)   Plan to continue diuresis with furosemide  80 mg IV bid Patient not candidate for renal replacement therapy.  Very poor prognosis.

## 2024-03-02 NOTE — TOC Initial Note (Signed)
 Transition of Care [facility]) - Initial/Assessment Note   Patient Details  Name: Maria Parrish MRN: [id number] Date of Birth: [birth year]  Transition of Care [facility]) CM/SW Contact:    Nena LITTIE Coffee, RN Phone Number: 03/02/2024, 6:07 PM  Clinical Narrative:                 Pt from [facility] where she has been a resident for approximately one month. Pt in declining health. Family to discuss hospice options and give decision tomorrow.   Expected Discharge Plan: Skilled Nursing Facility   Patient Goals and CMS Choice   Expected Discharge Plan and Services In-house Referral: Clinical Social Work Discharge Planning Services: CM Consult   Living arrangements for the past 2 months: Single Family Home, Skilled Nursing Facility       Prior Living Arrangements/Services Living arrangements for the past 2 months: Single Family Home, Skilled Nursing Facility Lives with:: Facility Resident        Need for Family Participation in Patient Care: Yes (Comment) Care giver support system in place?: Yes (comment)   Criminal Activity/Legal Involvement Pertinent to Current Situation/Hospitalization: No - Comment as needed  Activities of Daily Living   ADL Screening (condition at time of admission) Independently performs ADLs?: No Does the patient have a NEW difficulty with bathing/dressing/toileting/self-feeding that is expected to last >3 days?: No Does the patient have a NEW difficulty with getting in/out of bed, walking, or climbing stairs that is expected to last >3 days?: No Does the patient have a NEW difficulty with communication that is expected to last >3 days?: No Is the patient deaf or have difficulty hearing?: No Does the patient have difficulty seeing, even when wearing glasses/contacts?: No Does the patient have difficulty concentrating, remembering, or making decisions?: No  Permission Sought/Granted   Emotional Assessment Appearance:: Appears stated age Attitude/Demeanor/Rapport:  Unable to Assess Affect (typically observed): Unable to Assess   Alcohol  / Substance Use: Not Applicable Psych Involvement: No (comment)  Admission diagnosis:  Metabolic encephalopathy [G93.41] Hypokalemia [E87.6] Lower urinary tract infectious disease [N39.0] Hypothermia [T68.XXXA] Acute pulmonary edema (HCC) [J81.0] Altered mental status, unspecified altered mental status type [R41.82] Chronic kidney disease, unspecified CKD stage [N18.9] Patient Active Problem List   Diagnosis Date Noted   Dyslipidemia 03/02/2024   GERD without esophagitis 03/02/2024   Acute on chronic diastolic CHF (congestive heart failure) (HCC) 03/02/2024   Type 2 diabetes mellitus with hyperlipidemia (HCC) 03/02/2024   Sepsis secondary to UTI (HCC) 03/02/2024   Chronic kidney disease, stage 5 (HCC) 03/02/2024   Hypothermia 03/01/2024   Acute renal failure superimposed on stage 5 chronic kidney disease, not on chronic dialysis (HCC) 01/28/2024   Aspiration pneumonitis (HCC) 01/28/2024   Lobar pneumonia (HCC) 01/28/2024   Chronic heart failure with preserved ejection fraction (HFpEF) (HCC) 01/28/2024   Acute metabolic encephalopathy 01/28/2024   Acute hypoxemic respiratory failure (HCC) 09/28/2023   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) 02/27/2023   Insulin  long-term use (HCC) 02/27/2023   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) 06/15/2022   Statin intolerance 01/03/2022   Peripheral arterial disease (HCC) 09/07/2020   Chronic kidney disease 05/27/2020   Acute on chronic diastolic (congestive) heart failure (HCC) 05/13/2020   Acute on chronic diastolic congestive heart failure (HCC) 05/12/2020   Elevated serum creatinine 05/12/2020   Non compliance with medical treatment 12/17/2017   Hyperparathyroidism (HCC) 10/19/2016   Vitamin D  deficiency 01/12/2016   Altered mental status 09/22/2015   Left  hemiparesis (HCC) 09/22/2015   Acute encephalopathy 09/22/2015   Palliative  care encounter    UTI (urinary tract infection) 09/14/2015   Dementia (HCC) 09/14/2015   General weakness 09/14/2015   Subacute confusional state 09/14/2015   Type 2 diabetes mellitus without complication, with long-term current use of insulin  (HCC) 09/14/2015   Confusion 09/14/2015   Fall at home    Elevated parathyroid  hormone 09/09/2015   Mucosal abnormality of stomach    Diverticulosis of colon without hemorrhage    Hypercalcemia 08/10/2015   Goiter 08/10/2015   Abnormal weight loss 07/28/2015   Uncontrolled type 2 diabetes mellitus 03/26/2015   Neuropathy 03/26/2015   Hypokalemia 03/26/2015   Essential hypertension 03/26/2015   Mixed hyperlipidemia 03/26/2015   Malnutrition of moderate degree (HCC) 03/26/2015   PCP:  Margarete Maeola DASEN, FNP Pharmacy:   [facility] [address], [place] - [address] [place] [address] Phone: [phone] Fax: [phone]  [facility] - [place], [place] - [address] [place] [address] Phone: [phone] Fax: [phone]  Social Drivers of Health (SDOH) Social History: SDOH Screenings   Food Insecurity: No Food Insecurity (03/02/2024)  Housing: Low Risk  (03/02/2024)  Transportation Needs: No Transportation Needs (03/02/2024)  Utilities: Not At Risk (03/02/2024)  Depression (PHQ2-9): Low Risk  (09/14/2023)  Physical Activity: Inactive (08/20/2020)  Social Connections: Moderately Integrated (01/28/2024)  Tobacco Use: Low Risk  (03/01/2024)   SDOH Interventions:   Readmission Risk Interventions    03/02/2024    6:03 PM 01/29/2024   12:50 PM 11/19/2023    3:13 PM  Readmission Risk Prevention Plan  Transportation Screening Complete Complete Complete  PCP or Specialist Appt within 3-5 Days Not Complete  Not Complete  HRI or Home Care Consult Complete Complete Complete  Social Work Consult for Recovery Care Planning/Counseling Complete Complete Not Complete   Palliative Care Screening Complete Not Applicable Not Complete  Medication Review Oceanographer) Complete Complete Complete

## 2024-03-03 DIAGNOSIS — Z7189 Other specified counseling: Secondary | ICD-10-CM

## 2024-03-03 DIAGNOSIS — Z515 Encounter for palliative care: Secondary | ICD-10-CM

## 2024-03-03 DIAGNOSIS — I5033 Acute on chronic diastolic (congestive) heart failure: Secondary | ICD-10-CM | POA: Diagnosis not present

## 2024-03-03 DIAGNOSIS — N185 Chronic kidney disease, stage 5: Secondary | ICD-10-CM | POA: Diagnosis not present

## 2024-03-03 DIAGNOSIS — I10 Essential (primary) hypertension: Secondary | ICD-10-CM | POA: Diagnosis not present

## 2024-03-03 DIAGNOSIS — Z558 Other problems related to education and literacy: Secondary | ICD-10-CM | POA: Diagnosis not present

## 2024-03-03 DIAGNOSIS — A419 Sepsis, unspecified organism: Secondary | ICD-10-CM | POA: Diagnosis not present

## 2024-03-03 DIAGNOSIS — N189 Chronic kidney disease, unspecified: Secondary | ICD-10-CM

## 2024-03-03 LAB — RENAL FUNCTION PANEL
Albumin: 2.7 g/dL — ABNORMAL LOW (ref 3.5–5.0)
Anion gap: 9 (ref 5–15)
BUN: 59 mg/dL — ABNORMAL HIGH (ref 8–23)
CO2: 24 mmol/L (ref 22–32)
Calcium: 8.5 mg/dL — ABNORMAL LOW (ref 8.9–10.3)
Chloride: 112 mmol/L — ABNORMAL HIGH (ref 98–111)
Creatinine, Ser: 5.41 mg/dL — ABNORMAL HIGH (ref 0.44–1.00)
GFR, Estimated: 8 mL/min — ABNORMAL LOW (ref >60)
Glucose, Bld: 89 mg/dL (ref 70–99)
Phosphorus: 5.7 mg/dL — ABNORMAL HIGH (ref 2.5–4.6)
Potassium: 3.8 mmol/L (ref 3.5–5.1)
Sodium: 145 mmol/L (ref 135–145)

## 2024-03-03 LAB — CBC
HCT: 32.3 % — ABNORMAL LOW (ref 36.0–46.0)
Hemoglobin: 10 g/dL — ABNORMAL LOW (ref 12.0–15.0)
MCH: 29.9 pg (ref 26.0–34.0)
MCHC: 31 g/dL (ref 30.0–36.0)
MCV: 96.7 fL (ref 80.0–100.0)
Platelets: 214 K/uL (ref 150–400)
RBC: 3.34 MIL/uL — ABNORMAL LOW (ref 3.87–5.11)
RDW: 16.7 % — ABNORMAL HIGH (ref 11.5–15.5)
WBC: 5.6 K/uL (ref 4.0–10.5)
nRBC: 0.4 % — ABNORMAL HIGH (ref 0.0–0.2)

## 2024-03-03 LAB — GLUCOSE, CAPILLARY: Glucose-Capillary: 78 mg/dL (ref 70–99)

## 2024-03-03 MED ORDER — GLYCOPYRROLATE 0.2 MG/ML IJ SOLN: 0.2000 mg | INTRAMUSCULAR | Status: DC | PRN

## 2024-03-03 MED ORDER — GLYCOPYRROLATE 1 MG PO TABS
1.0000 mg | ORAL_TABLET | ORAL | Status: DC | PRN
Start: 2024-03-03 — End: 2024-03-06

## 2024-03-03 MED ORDER — LORAZEPAM 2 MG/ML IJ SOLN
1.0000 mg | INTRAMUSCULAR | Status: DC | PRN
  Administered 2024-03-04 – 2024-03-06 (×6): 1 mg via INTRAVENOUS
  Filled 2024-03-03 (×6): qty 1

## 2024-03-03 MED ORDER — SODIUM CHLORIDE 0.9 % IV SOLN: INTRAVENOUS | Status: AC

## 2024-03-03 MED ORDER — POLYVINYL ALCOHOL 1.4 % OP SOLN: 1.0000 [drp] | Freq: Four times a day (QID) | OPHTHALMIC | Status: DC | PRN

## 2024-03-03 MED ORDER — HYDROMORPHONE HCL 1 MG/ML IJ SOLN
0.5000 mg | INTRAMUSCULAR | Status: DC | PRN
  Administered 2024-03-03 – 2024-03-06 (×8): 0.5 mg via INTRAVENOUS
  Filled 2024-03-03 (×8): qty 0.5

## 2024-03-03 NOTE — Plan of Care (Signed)
  Problem: Education: Goal: Knowledge of General Education information will improve Description: Including pain rating scale, medication(s)/side effects and non-pharmacologic comfort measures Outcome: Not Progressing   Problem: Activity: Goal: Risk for activity intolerance will decrease Outcome: Not Progressing   

## 2024-03-03 NOTE — TOC Progression Note (Signed)
 Transition of Care [facility]) - Progression Note    Patient Details  Name: Maria Parrish MRN: [id number] Date of Birth: September 17, 1945  Transition of Care [facility]) CM/SW Contact  Sharlyne Stabs, RN Phone Number: 03/03/2024, 1:04 PM  Clinical Narrative:   Palliative consulted TOC to send referral to [facility] for Residential hospice assessment. Referral sent, TOC following for an ETA and will update the team and family.     Expected Discharge Plan: Skilled Nursing Facility     Expected Discharge Plan and Services In-house Referral: Clinical Social Work Discharge Planning Services: CM Consult   Living arrangements for the past 2 months: Single Family Home, Skilled Nursing Facility                    Social Drivers of Health (SDOH) Interventions SDOH Screenings   Food Insecurity: No Food Insecurity (03/02/2024)  Housing: Low Risk  (03/02/2024)  Transportation Needs: No Transportation Needs (03/02/2024)  Utilities: Not At Risk (03/02/2024)  Depression (PHQ2-9): Low Risk  (09/14/2023)  Physical Activity: Inactive (08/20/2020)  Social Connections: Patient Unable To Answer (03/03/2024)  Tobacco Use: Low Risk  (03/01/2024)    Readmission Risk Interventions    03/03/2024    1:03 PM 03/02/2024    6:03 PM 01/29/2024   12:50 PM  Readmission Risk Prevention Plan  Transportation Screening  Complete Complete  PCP or Specialist Appt within 3-5 Days  Not Complete   HRI or Home Care Consult  Complete Complete  Social Work Consult for Recovery Care Planning/Counseling  Complete Complete  Palliative Care Screening  Complete Not Applicable  Medication Review Oceanographer)  Complete Complete  HRI or Home Care Consult Complete    SW Recovery Care/Counseling Consult Complete    Palliative Care Screening Complete    Skilled Nursing Facility Not Applicable

## 2024-03-03 NOTE — Consult Note (Signed)
 Consultation Note Date: 03/03/2024   Patient Name: Maria Parrish  DOB: 02/09/1946  MRN: [id number]  Age / Sex: 78 y.o., female  PCP: Margarete Maeola DASEN, FNP Referring Physician: Noralee Elidia Toribio DEWAINE  Reason for Consultation: Establishing goals of care  HPI/Patient Profile: 78 y.o. female  with pertinent medical history of dementia, diabetes, heart failure with improved ejection fraction, hyperlipidemia, hyperparathyroidism, hypertension, peripheral neuropathy, moderate protein calorie malnutrition admitted on 03/01/2024 with altered mental status.   Patient ultimately diagnosed with sepsis secondary to UTI with associated acute metabolic encephalopathy.  Also noted to have hypervolemia secondary to CKD stage V.  Noted to have AKI and oliguria.  Her dementia is quite advanced.  Patient has had recurrent hospitalizations (4 hospitalizations including this 1 during the past 6 months).  PMT has been consulted to assist with goals of care conversation.  Today, lab work reviewed.  Renal function progressively elevated compared to previous with current creatinine 5.41 (03/02/2024 5.02).  Mild anemia with hemoglobin 10 g/dL noted.  Blood cultures negative to date.  Urine culture significant for greater than 100,000 CFU's of gram-negative rods.  Further susceptibilities and bacterial information pending.  CT scan of the head obtained 03/01/2024 independently reviewed ventricles appear grossly enlarged but otherwise no acute findings.  Chest x-ray from 03/01/2024 independently reviewed findings consistent with volume overload and most notably to the left lower lobe and vascular congestion.  Vital signs reviewed.  Appears hypertensive and mildly bradycardic.  Respiratory rate slightly elevated.  Despite receiving IV Lasix  urine output has remained minimal.  Patient is not responsive.  Therefore, independent history obtained from nursing and patient's family.  Nursing states that  patient has been unresponsive during the time she has been caring for her.  She has had no p.o. intake by mouth due to safety concerns/unresponsiveness.  Clinical Assessment and Goals of Care:  I have reviewed medical records including EPIC notes, labs and imaging (independently reviewed), prior hospital encounters, assessed the patient and then met with patient's sons Maria and Maria Parrish to discuss diagnosis prognosis, GOC, EOL wishes, disposition and options. Collaborated directly with attending provider, bedside nursing staff, spiritual care, TOC, and patient's family.   I introduced Palliative Medicine as specialized medical care for people living with serious illness. It focuses on providing relief from the symptoms and stress of a serious illness. The goal is to improve quality of life for both the patient and the family.  We discussed a brief life review of the patient and then focused on their current illness.    I attempted to elicit values and goals of care important to the patient.    Medical History Review and Family/Patient Understanding:   Patient's sons both understand that she is gravely ill.  I discussed our concerns that she appears to be at end-of-life and with limited treatment options (appears to be hypervolemic and oliguric despite IV diuresis, never wanted dialysis and is no longer a candidate due to severity of illness).  They both verbalized understanding of this.  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  She is a woman of faith and very involved in church when she was able.  Functional and Nutritional State:  Patient's health has been declining for some time now.  She was living at home but started having some falls  and repeated hospitalizations.  She was then transferred to [facility] for short-term  rehab where she has been making some limited progress due to shortness of breath and fatigability.  Appetite was poor.  Palliative Symptoms:   Shortness of breath from volume overload (unable to report but tachypneic)  Advance Directives/GOC discussion:  The family consented to a voluntary Advance Care Planning Conversation in person. Individuals present for the conversation: Maria and Maria Parrish  A detailed discussion regarding advanced directives was had.  We discussed our concerns regarding her current acute on chronic severe illness.  We discussed our concern for poor prognosis and that she appears to be in the terminal phases of her disease.  We also discussed recent declines in her health including the need for recurrent hospitalizations and what this means in the greater context of her health.  We discussed patient's previously expressed goals and wishes.  She has always said that she would never want dialysis. Confirmed patient is a DNR/DNI.  We discussed the limited treatment options given evidence of significant hypervolemia, progressively declining renal function, and lack of urine output despite IV diuretics.  Discussed various treatment options (which are very limited) and goals of care.  At this time family wants her to be comfortable and have good quality of life. The difference between aggressive medical intervention and comfort care was considered in light of the patient's goals of care.  Decision was made to transition to comfort care.  Lengthy discussion held with patient's sons regarding what comfort care looks like including focusing less on numbers and more on how the patient looks/feels.  Shared that we will discontinue noncomfort medications (ie IVs) and interventions (daily labs, V/S q hr etc), initiate comfort meds (specifically discussed hydromorphone  4 pain and shortness of breath, Ativan  for agitation anxiety, and Robinul  for secretions), and allow unrestricted visitation so  that family can spend time with her.  Encouraged family to talk with her and share their love for her.  Offered spiritual care consult given patient's strong faith.  Family interested in this and will order.  We discussed her prognosis that this is likely less than 2 weeks.  We discussed what comfort care will look like in the hospital and then if she stabilizes enough discussed the next locations where comfort care could occur.  Specifically provided education regarding hospice philosophy, inclusion criteria, and treatment locations.  In this situation, hospice could occur including inpatient hospice versus SNF with hospice support.  Patient appropriate for inpatient hospice is preferred given goals of care/prognosis.  Discussed what inpatient hospice looks like and the care provided there.  Discussed various hospice agencies in the area.  Patient's sons live in [place] and prefer a hospice home in this area [place]).   Questions and concerns were addressed. The family was encouraged to call with questions or concerns.  PMT will continue to support holistically.  Discussed the importance of continued conversation with family and the medical providers regarding overall plan of care and treatment options, ensuring decisions are within the context of the patient's values and GOCs.   I spent 30 minutes providing separately identifiable ACP services with the patient and/or surrogate decision maker in a voluntary, in-person conversation discussing the patient's wishes and goals as detailed in the above note.   Code Status:  DNR/DNI/comfort care only   OTHER patient's sons, Maria has typically been making decisions but Maria also assists with decision making    SUMMARY OF RECOMMENDATIONS    Transition  to full comfort care Continue DNR/DNI Discontinue noncomfort medications and interventions Initiate comfort meds Unrestricted visitor status Spiritual care consult Consider referral to  inpatient hospice pending ability to transfer safely Palliative team to continue to follow for coordination of care, disposition planning, and symptom management  Code Status/Advance Care Planning: DNR   Symptom Management:  Continue Tylenol  suppositories as needed Ordered Robinul  as needed Ordered hydromorphone  0.5 mg every 2 hours as needed Ordered Ativan  1 mg IV every 4 hours as needed Continue Zofran  every 6 hours as needed  Palliative Prophylaxis:  Oral Care  Additional Recommendations (Limitations, Scope, Preferences): Full Comfort Care  Psycho-social/Spiritual:  Desire for further Chaplaincy support:yes Additional Recommendations: Education on Hospice  Prognosis:  < 2 weeks  Discharge Planning: To Be Determined, anticipate in-hospital death versus transition to inpatient hospice     Primary Diagnoses: Present on Admission:  Essential hypertension  GERD without esophagitis  Acute on chronic diastolic CHF (congestive heart failure) (HCC)  Type 2 diabetes mellitus with hyperlipidemia (HCC)  Dementia (HCC)  Malnutrition of moderate degree (HCC)  Sepsis secondary to UTI (HCC)  Chronic kidney disease, stage 5 (HCC)    Physical Exam Constitutional:      General: She is not in acute distress.    Appearance: She is ill-appearing.  Pulmonary:     Effort: Tachypnea present. No respiratory distress.  Skin:    General: Skin is warm and dry.  Neurological:     Mental Status: She is unresponsive.     Vital Signs: BP (!) 156/73   Pulse (!) 57   Temp (!) 97.3 F (36.3 C) (Bladder) Comment: on warming blanket  Resp 19   Ht 5' 4 (1.626 m)   Wt 78.7 kg   SpO2 95%   BMI 29.78 kg/m  Pain Scale: PAINAD   Pain Score: Asleep   SpO2: SpO2: 95 % O2 Device:SpO2: 95 % O2 Flow Rate: .O2 Flow Rate (L/min): 2 L/min   Palliative Assessment/Data: 10%     Billing based on MDM: High  Problems Addressed: One acute or chronic illness or injury that poses a threat  to life or bodily function  Amount and/or Complexity of Data: Category 1:Assessment requiring an independent historian(s) and Category 3:Discussion of management or test interpretation with external physician/other qualified health care professional/appropriate source (not separately reported)  Risks: Parenteral controlled substances and Decision not to resuscitate or to de-escalate care because of poor prognosis   Laymon CHRISTELLA Pinal, NP  Palliative Medicine Team Team phone # [phone]  Thank you for allowing the Palliative Medicine Team to assist in the care of this patient. Please utilize secure chat with additional questions, if there is no response within 30 minutes please call the above phone number.  Palliative Medicine Team providers are available by phone from 7am to 7pm daily and can be reached through the team cell phone.  Should this patient require assistance outside of these hours, please call the patient's attending physician.

## 2024-03-03 NOTE — Plan of Care (Signed)
   Problem: Fluid Volume: Goal: Hemodynamic stability will improve Outcome: Progressing   Problem: Clinical Measurements: Goal: Diagnostic test results will improve Outcome: Progressing Goal: Signs and symptoms of infection will decrease Outcome: Progressing   Problem: Respiratory: Goal: Ability to maintain adequate ventilation will improve Outcome: Progressing

## 2024-03-03 NOTE — Progress Notes (Signed)
 Progress Note   Patient: Maria Parrish FMW:[id number] DOB: May 23, 1946 DOA: 03/01/2024     2 DOS: the patient was seen and examined on 03/03/2024   Brief hospital course: [name] was admitted to the hospital with the working diagnosis of sepsis due to urinary tract infection.   78 yo female with the past medical history of dementia, T2DM, CKD, hypertension, heart failure and dyslipidemia who presented with altered mental status.  Recent hospitalization 07/14 to 02/02/24 for pneumonia, complicated with acute renal failure and encephalopathy. She was discharged to SNF, to continue with palliative/ hospice care due to progressive renal failure.  She was noted to have change in her mentation, to the point where she was not interactive, EMS was called and patient was brought to the hospital.  On her initial physical examination her blood pressure was 170/75, HR 59, RR 32 and 02 saturation 96% Lungs with decreased breath sounds bilaterally with rales at the left base, no wheezing or rhonchi, heart with S1 and S2 present and regular, abdomen not distended and not tender, no lower extremity edema.   Na 143, K 3.1 CL 110, bicarbonate 23, glucose 217, bun 59 cr 5.0  Mg 2.3  AST 53 ALT 60  BNP 352  High sensitive troponin 3  Lactic acid 0,6  Wbc 4.9 hgb 10.3 plt 208  TSH 2,5   Urine analysis SG 1.017, protein > 300, large leukocytes, small hgb, > 500 glucose, wbc >50   CT with no acute intracranial abnormality.  Extensive chronic small vessel ischemic disease.  Unchanged ventriculomegaly which may reflect central predominant cerebral atrophy or normal pressure hydrocephalus.   Chest radiograph with left rotation, mild cardiomegaly, bilateral hilar vascular congestion, small left pleural effusion with atelectasis.   EKG 38 bpm, normal axis, qtc 532, sinus rhythm with low voltage, no significant ST segment or T wave changes.   08/ 18 patient with no meaningful improvement , after IV antibiotics  and IV diuresis.  Continue to have very poor prognosis in the setting of advanced dementia, patient has been not responsive, not able to eat.  Palliative consulted and patient's family has decided to continue care under comfort care.   Assessment and Plan: * Sepsis secondary to UTI (HCC) Severe sepsis (present on admission)  Acute metabolic encephalopathy   Patient with no significant improvement, continue with rapid decline.  She has been transitioned to comfort care.   Acute on chronic diastolic CHF (congestive heart failure) (HCC) 09/2023 echocardiogram with preserved LV systolic function EF 55 to 60%, mild LVH, RV systolic function preserved, RVSP 37.6 mmHg, LA with mild dilatation, mitral valve with mild regurgitation,   Positive signs of hypervolemia  Not responding to high doses of furosemide  IV   Acute hypoxemic respiratory failure due to acute pulmonary edema,  Transitioned to comfort care  Essential hypertension Patient not tolerating po meds due to altered cognition.  Transitioned to comfort care   Chronic kidney disease, stage 5 (HCC) AKI, oliguric.   Patient not candidate for renal replacement therapy.  Very poor prognosis.  Transitioned to comfort care   Type 2 diabetes mellitus with hyperlipidemia [facility]) Patient with no po intake.  Hold on insulin  therapy, transitioned to comfort care   Dementia [facility]) Advance dementia  Continue supportive care Poor prognosis   GERD without esophagitis Transitioned to comfort care   Malnutrition of moderate degree (HCC) Very poor prognosis  Not able to take po due to cognitive impairment.      Subjective: Patient  not responsive, her son is at the bedside   Physical Exam: Vitals:   03/03/24 1103 03/03/24 1113 03/03/24 1144 03/03/24 1252  BP:  (!) 193/65 (!) 170/45   Pulse:  (!) 59 66   Resp:  (!) 24 (!) 28   Temp: 98 F (36.7 C) 98 F (36.7 C)  97.9 F (36.6 C)  TempSrc: Bladder Rectal  Rectal  SpO2:  97% 98%    Weight:      Height:       Patient not responsive to voice or touch Continue very edematous, deconditioned and ill looking appearing    Data Reviewed:    Family Communication: I spoke with patient's son at the bedside, we talked in detail about patient's condition, plan of care and prognosis and all questions were addressed.   Disposition: Status is: Inpatient Remains inpatient appropriate because: transition to comfort care   Planned Discharge Destination: to be determined      Author: Elidia Toribio Furnace, MD 03/03/2024 2:17 PM  For on call review [email or web address].

## 2024-03-04 DIAGNOSIS — Z558 Other problems related to education and literacy: Secondary | ICD-10-CM | POA: Diagnosis not present

## 2024-03-04 DIAGNOSIS — A419 Sepsis, unspecified organism: Secondary | ICD-10-CM | POA: Diagnosis not present

## 2024-03-04 DIAGNOSIS — N390 Urinary tract infection, site not specified: Secondary | ICD-10-CM | POA: Diagnosis not present

## 2024-03-04 DIAGNOSIS — Z7189 Other specified counseling: Secondary | ICD-10-CM | POA: Diagnosis not present

## 2024-03-04 DIAGNOSIS — Z515 Encounter for palliative care: Secondary | ICD-10-CM | POA: Diagnosis not present

## 2024-03-04 LAB — URINE CULTURE: Culture: >=100000 — AB

## 2024-03-04 MED ORDER — HALOPERIDOL LACTATE 2 MG/ML PO CONC: 0.5000 mg | ORAL | Status: DC | PRN

## 2024-03-04 MED ORDER — HALOPERIDOL LACTATE 5 MG/ML IJ SOLN: 0.5000 mg | INTRAMUSCULAR | Status: DC | PRN

## 2024-03-04 MED ORDER — HALOPERIDOL 0.5 MG PO TABS: 0.5000 mg | ORAL_TABLET | ORAL | Status: DC | PRN

## 2024-03-04 MED ORDER — BIOTENE DRY MOUTH MT LIQD: 15.0000 mL | OROMUCOSAL | Status: DC | PRN

## 2024-03-04 NOTE — Progress Notes (Signed)
 For continuity of care for this patient at the end of life, the family prefers to remain in ICU/SDU. If at all possible, we should try to honor this request.  Bernardino Come, MD 03/04/2024 2:41 PM

## 2024-03-04 NOTE — Progress Notes (Signed)
 [facility] PROGRESS NOTE  Maria Parrish (DOB: [birth year]) FMW:[id number] PCP: Margarete Maeola DASEN, FNP  Brief Narrative: 78 yo female with the past medical history of dementia, T2DM, CKD, hypertension, heart failure and dyslipidemia who presented with altered mental status.  Recent hospitalization 07/14 to 02/02/24 for pneumonia, complicated with acute renal failure and encephalopathy. She was discharged to SNF, to continue with palliative/ hospice care due to progressive renal failure.  She was noted to have change in her mentation, to the point where she was not interactive, EMS was called and patient was brought to the hospital.  On her initial physical examination her blood pressure was 170/75, HR 59, RR 32 and 02 saturation 96% Lungs with decreased breath sounds bilaterally with rales at the left base, no wheezing or rhonchi, heart with S1 and S2 present and regular, abdomen not distended and not tender, no lower extremity edema.    Na 143, K 3.1 CL 110, bicarbonate 23, glucose 217, bun 59 cr 5.0  Mg 2.3  AST 53 ALT 60  BNP 352  High sensitive troponin 3  Lactic acid 0,6  Wbc 4.9 hgb 10.3 plt 208  TSH 2,5    Urine analysis SG 1.017, protein > 300, large leukocytes, small hgb, > 500 glucose, wbc >50    CT with no acute intracranial abnormality.  Extensive chronic small vessel ischemic disease.  Unchanged ventriculomegaly which may reflect central predominant cerebral atrophy or normal pressure hydrocephalus.    Chest radiograph with left rotation, mild cardiomegaly, bilateral hilar vascular congestion, small left pleural effusion with atelectasis.    EKG 38 bpm, normal axis, qtc 532, sinus rhythm with low voltage, no significant ST segment or T wave changes.    77/ 18 patient with no meaningful improvement , after IV antibiotics and IV diuresis.  Continue to have very poor prognosis in the setting of advanced dementia, patient has been not responsive, not able to eat.   Palliative consulted and patient's family has decided to continue care under comfort care.   Subjective: Patient not responsive this morning, later became agitated per RN report and was given medication. This afternoon, returned to room to discuss care with her son at bedside and 5 other family members. Chaplain, ICU RN director, and palliative NP also at bedside.  Objective: BP (!) 168/68   Pulse (!) 44   Temp 97.9 F (36.6 C) (Rectal) Comment (Src): PT on warming blanket, but now transitioning to comfort care  Resp 16   Ht 5' 4 (1.626 m)   Wt 78.7 kg   SpO2 (!) 87%   BMI 29.78 kg/m   Gen: No distress, very frail Pulm: Nonlabored, tachypneic  CV: Regular bradycardia GI: Soft, without distention Neuro: Not meaningfully responsive Ext: Warm, dry, diffuse body wall and extremity edema Skin: No open wounds on visualized skin   Assessment & Plan: Sepsis secondary to UTI (HCC) Severe sepsis (present on admission)  Acute metabolic encephalopathy    Patient with no significant improvement, continue with rapid decline.  She has been transitioned to comfort care.    Acute on chronic diastolic CHF (congestive heart failure) (HCC) 09/2023 echocardiogram with preserved LV systolic function EF 55 to 60%, mild LVH, RV systolic function preserved, RVSP 37.6 mmHg, LA with mild dilatation, mitral valve with mild regurgitation,    Positive signs of hypervolemia  Not responding to high doses of furosemide  IV    Acute hypoxemic respiratory failure due to acute pulmonary edema,  Transitioned to comfort care, suggest  avoidance of supplemental oxygen , treating dyspnea with medications. Discussed at length with family and palliative NP.    Essential hypertension Patient not tolerating po meds due to altered cognition.  Transitioned to comfort care    Chronic kidney disease, stage 5 (HCC) AKI, oliguric.    Patient not candidate for renal replacement therapy.  Very poor prognosis.   Transitioned to comfort care    Type 2 diabetes mellitus with hyperlipidemia [facility]) Patient with no po intake.  Hold on insulin  therapy, transitioned to comfort care    Dementia [facility]) Advance dementia  Continue supportive care Poor prognosis    GERD without esophagitis Transitioned to comfort care    Malnutrition of moderate degree (HCC) Very poor prognosis  Not able to take po due to cognitive impairment.    [name], MD [facility] [email or web address] 03/04/2024, 2:35 PM

## 2024-03-04 NOTE — TOC Progression Note (Signed)
 Transition of Care [facility]) - Progression Note    Patient Details  Name: ABISOLA CARRERO MRN: [id number] Date of Birth: Apr 02, 1946  Transition of Care [facility]) CM/SW Contact  Sharlyne Stabs, RN Phone Number: 03/04/2024, 3:20 PM  Clinical Narrative:   Team consulted TOC to reschedule the hospice assessment. Family has been through a lot today, will be best to wait to continue discussion tomorrow. [facility] rescheduled the RN assessment for 11-11:30 tomorrow. Team updated.   Expected Discharge Plan: Skilled Nursing Facility     Expected Discharge Plan and Services In-house Referral: Clinical Social Work Discharge Planning Services: CM Consult   Living arrangements for the past 2 months: Single Family Home, Skilled Nursing Facility                     Social Drivers of Health (SDOH) Interventions SDOH Screenings   Food Insecurity: No Food Insecurity (03/02/2024)  Housing: Low Risk  (03/02/2024)  Transportation Needs: No Transportation Needs (03/02/2024)  Utilities: Not At Risk (03/02/2024)  Depression (PHQ2-9): Low Risk  (09/14/2023)  Physical Activity: Inactive (08/20/2020)  Social Connections: Patient Unable To Answer (03/03/2024)  Tobacco Use: Low Risk  (03/01/2024)    Readmission Risk Interventions    03/03/2024    1:03 PM 03/02/2024    6:03 PM 01/29/2024   12:50 PM  Readmission Risk Prevention Plan  Transportation Screening  Complete Complete  PCP or Specialist Appt within 3-5 Days  Not Complete   HRI or Home Care Consult  Complete Complete  Social Work Consult for Recovery Care Planning/Counseling  Complete Complete  Palliative Care Screening  Complete Not Applicable  Medication Review [employer])  Complete Complete  HRI or Home Care Consult Complete    SW Recovery Care/Counseling Consult Complete    Palliative Care Screening Complete    Skilled Nursing Facility Not Applicable

## 2024-03-04 NOTE — Plan of Care (Signed)
   Problem: Fluid Volume: Goal: Hemodynamic stability will improve Outcome: Progressing   Problem: Clinical Measurements: Goal: Diagnostic test results will improve Outcome: Progressing Goal: Signs and symptoms of infection will decrease Outcome: Progressing   Problem: Respiratory: Goal: Ability to maintain adequate ventilation will improve Outcome: Progressing

## 2024-03-04 NOTE — Progress Notes (Signed)
 Daily Progress Note   Patient Name: Maria Parrish       Date: 03/04/2024 DOB: 1946/06/06  Age: 78 y.o. MRN#: [id number] Attending Physician: Bryn Bernardino NOVAK, MD Primary Care Physician: Margarete Maeola DASEN, FNP Admit Date: 03/01/2024  Reason for Consultation/Follow-up: Establishing goals of care and symptom management  Subjective:   79 y.o. female  with pertinent medical history of dementia, diabetes, heart failure with improved ejection fraction, hyperlipidemia, hyperparathyroidism, hypertension, peripheral neuropathy, moderate protein calorie malnutrition admitted on 03/01/2024 with altered mental status.    Patient ultimately diagnosed with sepsis secondary to UTI with associated acute metabolic encephalopathy.  Also noted to have hypervolemia secondary to CKD stage V.  Noted to have AKI and oliguria.  Her dementia is quite advanced.   Patient has had recurrent hospitalizations (4 hospitalizations including this 1 during the past 6 months).  03/03/2024: Extensive goals of care and anticipatory care plan discussion held at patient's bedside with sons Cathlyn and [employer].  Discussed patient's acute on chronic illness and what it means in the context of her overall health and prognosis.  Also discussed implications of her progressive kidney failure and that given where she is with her health, treatment options are extremely limited.  Ultimately, decision was made to transition patient to comfort care only.  Today, medication administration record reviewed.  She required a total of 1 mg of IV Dilaudid  on 24-hour look back.  Also required 1 mg of Ativan  IV on 24-hour look back.  Progressive bradycardia and tachypnea noted as well as variable O2 sats consistent with active dying phase.  Patient is unable to provide any history and as such independent  history is are obtained from nursing and family.  Nursing reports that patient has largely been calm and comfortable but did have periods of agitation and pulling at lines, O2, and blankets.  Patient was by herself on initial rounds and did appear tachypneic and agitated pulling at IV and blankets.  Later, I was called to the bedside by nursing who shared that family had some concerns.  Went to bedside and had lengthy discussion with family (patient's son Rozelle and other individuals) x 2.  On second meeting, attending physician, chaplain, and bedside nursing staff also accompanied.  Provided education that patient's kidneys have failed.  Discussed that despite initial aggressive measures, we were unable to reverse fluid accumulation/progression to end stage renal disease.  Discussed that patient always declined dialysis.  And at this point even if family desired to go against patient's previously expressed wishes and pursue dialysis that she would no longer be a candidate for dialysis  due to severity of her current illness.  We discussed that she appears to be dying.  Shared that given patient's previously expressed goals, evidence that she has entered phase of active dying, limited treatment options, and high potential for symptom burden related to volume overload that we had decided to make patient comfort care and focus on comfort and quality of life yesterday.  Family wonders if there are any other unexplored treatment options.  Discussed various treatment paths of going down a more aggressive care route versus continuing with previously discussed comfort care route.  Shared our concerns that even if we pursued a more aggressive route, patient's kidneys have failed and that any more aggressive treatments are unlikely to help and if they did help would only be temporary and that ultimately she would still pass away but experience much more suffering at end-of-life than if we focused on comfort.  Provided  extensive education regarding symptom medication use at the end of life and corrected family's assumption that medicines hasten death.  Discussed how we use nonverbal cues to help determine when to administer medicines in order to ensure she is comfortable.  Provided education regarding the types of medication use.  Also provided education regarding declining mental status as patient progresses through end-of-life and expected physical end-of-life changes.  Also provided with a book gone from my sight as a reference as well.  Open discussion with family regarding our understanding of how difficult it is to lose someone that you love.  Time, space, and emotional support provided.  Ultimately, family recognizes that patient is dying and elect to continue full comfort measures.  Chart review/care coordination:  Completed extensive chart review including EPIC notes, vital signs, MAR. Coordinated care with admitting MD, bedside nursing staff, TOC, and spiritual care team   Length of Stay: 3   Physical Exam Constitutional:      Comments: Chronically ill-appearing  Pulmonary:     Effort: No respiratory distress.     Comments: Initially mildly tachypneic although respirations improved to even, unlabored, and regular rate after symptom meds Skin:    General: Skin is warm and dry.  Neurological:     Comments: Patient does not respond to questioning or physical stimuli.  She does appear slightly agitated and is pulling at cords, gown, and blanket.  Occasional grimacing.  She appears calm and comfortable after symptom meds             Vital Signs: BP (!) 168/68   Pulse (!) 44   Temp 97.9 F (36.6 C) (Rectal) Comment (Src): PT on warming blanket, but now transitioning to comfort care  Resp 16   Ht 5' 4 (1.626 m)   Wt 78.7 kg   SpO2 (!) 87%   BMI 29.78 kg/m  SpO2: SpO2: (!) 87 % O2 Device: O2 Device: Room Air O2 Flow Rate: O2 Flow Rate (L/min): 2 L/min      Palliative Assessment/Data:  10%   Palliative Care Assessment & Plan   Patient Profile/Assessment:   78 y.o. female admitted on 03/01/2024 with altered mental status. Patient ultimately diagnosed with sepsis secondary to UTI with associated acute metabolic encephalopathy.  Also noted to have hypervolemia secondary to CKD stage V.  Noted to have AKI and oliguria.  Her dementia is quite advanced. Patient has had recurrent hospitalizations (4 hospitalizations including this 1 during the past 6 months).  Despite aggressive treatment with IV diuresis and IV antibiotics, patient continued to decline.  Kidneys appear to have completely  failed and she appears to be dying.  Long goals of care discussion completed on 03/03/2024.  Decision was made to transition to comfort care only with plans to pursue inpatient hospice if health stabilizes enough for discharge.  Family meeting held again today to discuss as there were some concerns.  Ultimately, after speaking with the medical team and family decision was made to continue comfort care with plan to discharge to inpatient hospice.   Recommendations/Plan:  Continue DNR/DNI/comfort care Continue current symptom regimen Appreciate ongoing involvement of spiritual care services Palliative team to continue to follow for coordination of care, disposition planning, and symptom management    Symptom management:  Continue Tylenol  suppositories as needed Continue Robinul  as needed Continue hydromorphone  0.5 mg every 2 hours as needed Continue Ativan  1 mg IV every 4 hours as needed Continue Zofran  every 6 hours as needed  Prognosis:  < 2 weeks    Discharge Planning: Hospice facility    Detailed review of medical records (labs, imaging, vital signs), medically appropriate exam, discussed with treatment team, counseling and education to patient, family, & staff, documenting clinical information, medication management, coordination of care    Billing based on MDM: High  Problems  Addressed: One acute or chronic illness or injury that poses a threat to life or bodily function  Amount and/or Complexity of Data: Category 1:Assessment requiring an independent historian(s) and Category 3:Discussion of management or test interpretation with external physician/other qualified health care professional/appropriate source (not separately reported)  Risks: Parenteral controlled substances and Decision regarding hospitalization or escalation of hospital care         Laymon CHRISTELLA Pinal, NP  Palliative Medicine Team Team phone # [phone]  Thank you for allowing the Palliative Medicine Team to assist in the care of this patient. Please utilize secure chat with additional questions, if there is no response within 30 minutes please call the above phone number.  Palliative Medicine Team providers are available by phone from 7am to 7pm daily and can be reached through the team cell phone.  Should this patient require assistance outside of these hours, please call the patient's attending physician.

## 2024-03-05 DIAGNOSIS — Z515 Encounter for palliative care: Secondary | ICD-10-CM | POA: Diagnosis not present

## 2024-03-05 DIAGNOSIS — N390 Urinary tract infection, site not specified: Secondary | ICD-10-CM | POA: Diagnosis not present

## 2024-03-05 DIAGNOSIS — R4182 Altered mental status, unspecified: Secondary | ICD-10-CM | POA: Diagnosis not present

## 2024-03-05 DIAGNOSIS — Z7189 Other specified counseling: Secondary | ICD-10-CM | POA: Diagnosis not present

## 2024-03-05 DIAGNOSIS — A419 Sepsis, unspecified organism: Secondary | ICD-10-CM | POA: Diagnosis not present

## 2024-03-05 NOTE — Plan of Care (Signed)
 Problem: Fluid Volume: Goal: Hemodynamic stability will improve Outcome: Progressing   Problem: Clinical Measurements: Goal: Diagnostic test results will improve Outcome: Progressing Goal: Signs and symptoms of infection will decrease Outcome: Progressing   Problem: Respiratory: Goal: Ability to maintain adequate ventilation will improve Outcome: Progressing   Problem: Education: Goal: Knowledge of General Education information will improve Description: Including pain rating scale, medication(s)/side effects and non-pharmacologic comfort measures Outcome: Progressing   Problem: Health Behavior/Discharge Planning: Goal: Ability to manage health-related needs will improve Outcome: Progressing   Problem: Clinical Measurements: Goal: Ability to maintain clinical measurements within normal limits will improve Outcome: Progressing Goal: Will remain free from infection Outcome: Progressing Goal: Diagnostic test results will improve Outcome: Progressing Goal: Respiratory complications will improve Outcome: Progressing Goal: Cardiovascular complication will be avoided Outcome: Progressing   Problem: Activity: Goal: Risk for activity intolerance will decrease Outcome: Progressing   Problem: Nutrition: Goal: Adequate nutrition will be maintained Outcome: Progressing   Problem: Elimination: Goal: Will not experience complications related to bowel motility Outcome: Progressing Goal: Will not experience complications related to urinary retention Outcome: Progressing   Problem: Pain Managment: Goal: General experience of comfort will improve and/or be controlled Outcome: Progressing   Problem: Safety: Goal: Ability to remain free from injury will improve Outcome: Progressing   Problem: Skin Integrity: Goal: Risk for impaired skin integrity will decrease Outcome: Progressing   Problem: Education: Goal: Ability to describe self-care measures that may prevent or decrease  complications (Diabetes Survival Skills Education) will improve Outcome: Progressing Goal: Individualized Educational Video(s) Outcome: Progressing   Problem: Coping: Goal: Ability to adjust to condition or change in health will improve Outcome: Progressing   Problem: Fluid Volume: Goal: Ability to maintain a balanced intake and output will improve Outcome: Progressing   Problem: Health Behavior/Discharge Planning: Goal: Ability to identify and utilize available resources and services will improve Outcome: Progressing Goal: Ability to manage health-related needs will improve Outcome: Progressing   Problem: Metabolic: Goal: Ability to maintain appropriate glucose levels will improve Outcome: Progressing   Problem: Nutritional: Goal: Maintenance of adequate nutrition will improve Outcome: Progressing Goal: Progress toward achieving an optimal weight will improve Outcome: Progressing   Problem: Skin Integrity: Goal: Risk for impaired skin integrity will decrease Outcome: Progressing   Problem: Tissue Perfusion: Goal: Adequacy of tissue perfusion will improve Outcome: Progressing   Problem: Education: Goal: Knowledge of the prescribed therapeutic regimen will improve Outcome: Progressing   Problem: Coping: Goal: Ability to identify and develop effective coping behavior will improve Outcome: Progressing   Problem: Clinical Measurements: Goal: Quality of life will improve Outcome: Progressing   Problem: Respiratory: Goal: Verbalizations of increased ease of respirations will increase Outcome: Progressing   Problem: Role Relationship: Goal: Family's ability to cope with current situation will improve Outcome: Progressing Goal: Ability to verbalize concerns, feelings, and thoughts to partner or family member will improve Outcome: Progressing   Problem: Pain Management: Goal: Satisfaction with pain management regimen will improve Outcome: Progressing   Problem:  Education: Goal: Knowledge of the prescribed therapeutic regimen will improve Outcome: Progressing   Problem: Coping: Goal: Ability to identify and develop effective coping behavior will improve Outcome: Progressing   Problem: Clinical Measurements: Goal: Quality of life will improve Outcome: Progressing   Problem: Respiratory: Goal: Verbalizations of increased ease of respirations will increase Outcome: Progressing   Problem: Role Relationship: Goal: Family's ability to cope with current situation will improve Outcome: Progressing Goal: Ability to verbalize concerns, feelings, and thoughts to partner or family member  will improve Outcome: Progressing

## 2024-03-05 NOTE — Progress Notes (Signed)
   03/03/24 1218  Spiritual Encounters  Type of Visit Initial  Care provided to: Patient;Pt and family  Conversation partners present during encounter Nurse  Referral source IDT Rounds  Reason for visit End-of-life  OnCall Visit No  Spiritual Framework  Presenting Themes Meaning/purpose/sources of inspiration;Impactful experiences and emotions;Courage hope and growth;Significant life change  Community/Connection Family;Friend(s);Faith community  Patient Stress Factors None identified  Family Stress Factors Loss  Interventions  Spiritual Care Interventions Made Established relationship of care and support;Compassionate presence;Reflective listening;Normalization of emotions;Narrative/life review;Meaning making;Bereavement/grief support;Prayer  Intervention Outcomes  Outcomes Connection to spiritual care;Awareness of support;Reduced anxiety;Awareness around self/spiritual resourses  Spiritual Care Plan  Spiritual Care Issues Still Outstanding Chaplain will continue to follow   Found son Rozelle bedside today with his mother. Patient was comfortable and without signs of pain. Chaplain sat bedside and engaged Landon in reflection around his mothers upbringing, spiritual heritage, illness story, and his own internal struggle with letting go. Chaplain created space for life review and reflection and supported his process. Provided prayer bedside as well. Will continue to follow in order to provide spiritual support and to assess for spiritual need.   Ether Blush, M.Div Chaplain

## 2024-03-05 NOTE — Progress Notes (Signed)
 [facility] PROGRESS NOTE  Maria Parrish (DOB: 05-Mar-1946) FMW:[id number] PCP: Margarete Maeola DASEN, FNP  Brief Narrative: 78 yo female with the past medical history of dementia, T2DM, CKD, hypertension, heart failure and dyslipidemia who presented with altered mental status.  Recent hospitalization 07/14 to 02/02/24 for pneumonia, complicated with acute renal failure and encephalopathy. She was discharged to SNF, to continue with palliative/ hospice care due to progressive renal failure.  She was noted to have change in her mentation, to the point where she was not interactive, EMS was called and patient was brought to the hospital.  On her initial physical examination her blood pressure was 170/75, HR 59, RR 32 and 02 saturation 96% Lungs with decreased breath sounds bilaterally with rales at the left base, no wheezing or rhonchi, heart with S1 and S2 present and regular, abdomen not distended and not tender, no lower extremity edema.    Na 143, K 3.1 CL 110, bicarbonate 23, glucose 217, bun 59 cr 5.0  Mg 2.3  AST 53 ALT 60  BNP 352  High sensitive troponin 3  Lactic acid 0,6  Wbc 4.9 hgb 10.3 plt 208  TSH 2,5    Urine analysis SG 1.017, protein > 300, large leukocytes, small hgb, > 500 glucose, wbc >50    CT with no acute intracranial abnormality.  Extensive chronic small vessel ischemic disease.  Unchanged ventriculomegaly which may reflect central predominant cerebral atrophy or normal pressure hydrocephalus.    Chest radiograph with left rotation, mild cardiomegaly, bilateral hilar vascular congestion, small left pleural effusion with atelectasis.    EKG 38 bpm, normal axis, qtc 532, sinus rhythm with low voltage, no significant ST segment or T wave changes.    08/ 18 patient with no meaningful improvement , after IV antibiotics and IV diuresis.  Continue to have very poor prognosis in the setting of advanced dementia, patient has been not responsive, not able to eat.   Palliative consulted and patient's family has decided to continue care under comfort care.   Subjective: Hospice evaluating and educating at bedside, many family members in room. Pt has continued receiving intermittent medications for pain/restlessness/agitation with good effect.  Objective: BP (!) 175/65   Pulse (!) 50   Temp 98.5 F (36.9 C) (Axillary)   Resp 15   Ht 5' 4 (1.626 m)   Wt 78.7 kg   SpO2 93%   BMI 29.78 kg/m   Frail elderly female poorly responsive  Assessment & Plan: Sepsis secondary to UTI (HCC) Severe sepsis (present on admission)  Acute metabolic encephalopathy    Patient with no significant improvement, continue with rapid decline.  She has been transitioned to comfort care.    Acute on chronic diastolic CHF (congestive heart failure) (HCC) 09/2023 echocardiogram with preserved LV systolic function EF 55 to 60%, mild LVH, RV systolic function preserved, RVSP 37.6 mmHg, LA with mild dilatation, mitral valve with mild regurgitation,    Positive signs of hypervolemia  Not responding to high doses of furosemide  IV    Acute hypoxemic respiratory failure due to acute pulmonary edema,  Transitioned to comfort care, suggest avoidance of supplemental oxygen , treating dyspnea with medications. Discussed at length with family and palliative NP.   Terminal agitation: Continue active symptom management, suspect would be appropriate for inpatient hospice Tx. Evaluation requested today.    Essential hypertension Patient not tolerating po meds due to altered cognition.  Transitioned to comfort care    Chronic kidney disease, stage 5 (HCC) AKI, oliguric.  Patient not candidate for renal replacement therapy.  Very poor prognosis.  Transitioned to comfort care    Type 2 diabetes mellitus with hyperlipidemia [facility]) Patient with no po intake.  Hold on insulin  therapy, transitioned to comfort care    Dementia [facility]) Advance dementia  Continue supportive  care Poor prognosis    GERD without esophagitis Transitioned to comfort care    Malnutrition of moderate degree (HCC) Very poor prognosis  Not able to take po due to cognitive impairment.    [name], MD [facility] [email or web address] 03/05/2024, 12:31 PM

## 2024-03-05 NOTE — Plan of Care (Signed)
   Problem: Fluid Volume: Goal: Hemodynamic stability will improve Outcome: Progressing   Problem: Clinical Measurements: Goal: Diagnostic test results will improve Outcome: Progressing Goal: Signs and symptoms of infection will decrease Outcome: Progressing   Problem: Respiratory: Goal: Ability to maintain adequate ventilation will improve Outcome: Progressing

## 2024-03-05 NOTE — Progress Notes (Addendum)
 Pt. Attempting to get out of bed, agitated, and anxious pulling off lines, gown, and blankets. Pt. Grimacing and nurse performed PAINAD assessment, upon high score, PRN medication given.   At 0220: Pt. Pulling lines, gown, and blankets off. Pt. Readjusted in bed, made comfortable. Offered something top drink, pt. Refused. Pt. Requested to turn the tv on to something. TV turned on. Call bell within reach, bed in lowest position, bed alarm on, non-slip socks on, fall mats in place, yellow fall bracelet on pt.'s wrist, educated pt. On the importance of using the call bell instead of trying to get up.   At 0245: pt. Attempting to get out of bed again, pt. Redirected.   At 0300: Pt. Pulling lines off, gown off, other monitors, pt. Seems to be very restless, agitated, and anxious. Multiple nurses have been in pt.'s rooms to redirect and assist pt. To most comfortable position as possible.   At 0345: pt. Pulled off cords, gown, blankets, and pulled out IV, pt. Grimacing, and labored breathing. Upon PAINAD assessment, meds given per [facility].   At 0430: Pt. Lying in bed with eyes closed resting.

## 2024-03-05 NOTE — Progress Notes (Signed)
 Daily Progress Note   Patient Name: Maria Parrish       Date: 03/05/2024 DOB: 1946-06-21  Age: 78 y.o. MRN#: [id number] Attending Physician: Bryn Bernardino NOVAK, MD Primary Care Physician: Margarete Maeola DASEN, FNP Admit Date: 03/01/2024  Reason for Consultation/Follow-up: Establishing goals of care  Subjective:   78 y.o. female  with pertinent medical history of dementia, diabetes, heart failure with improved ejection fraction, hyperlipidemia, hyperparathyroidism, hypertension, peripheral neuropathy, moderate protein calorie malnutrition admitted on 03/01/2024 with altered mental status.    Patient ultimately diagnosed with sepsis secondary to UTI with associated acute metabolic encephalopathy.  Also noted to have hypervolemia secondary to CKD stage V.  Noted to have AKI and oliguria.  Her dementia is quite advanced.   Patient has had recurrent hospitalizations (4 hospitalizations including this 1 during the past 6 months).   03/03/2024: Extensive goals of care and anticipatory care plan discussion held at patient's bedside with sons Cathlyn and [employer].  Discussed patient's acute on chronic illness and what it means in the context of her overall health and prognosis.  Also discussed implications of her progressive kidney failure and that given where she is with her health, treatment options are extremely limited.  Ultimately, decision was made to transition patient to comfort care only.   03/04/2024: Met with family at bedside.  They had numerous concerns and questions.  Through a collaborative effort with myself, attending physician, bedside nursing team, and chaplain services we were able to answer all of the family's questions and provide education and support.  Ultimately, decision was made to continue with full comfort measures and plan to discharge  to hospice.  Today, vital signs reviewed.  She continues to have some bradycardia and bradypnea consistent with progression towards end-of-life.  O2 sats currently stable on room air.  Symptom medication administered on 24-hour look back: Dilaudid  IV total of 2 mg, Ativan  IV total of 3 mg.  Continues to have insufficient urine output.  Patient does not respond to questioning and therefore, independent history obtained from nursing staff and family.  Per nursing staff, she does have periods of agitation and did pull her IV out last night.  She also had some grimacing.  As needed symptom meds do appear effective.  Spoke with family at bedside.  Son Rozelle and other family members are present.  Son states he is coping.  He feels she is comfortable at this time.  Discussed the reason she may have been more awake at night and sleepy during the day.  I also assess discomfort with current plan of continuing comfort measures and discharge  with hospice.  Patient states he is comfortable with this.  He spoke with hospice today and the plan is for her to discharge to inpatient hospice facility when a bed is available.  Chart review/care coordination:  Completed extensive chart review including EPIC notes, vital signs, medication administration record, and intake and output. Coordinated care with admitting physician, bedside nursing staff, and TOC.   Length of Stay: 4   Physical Exam Constitutional:      General: She is not in acute distress.    Appearance: She is not toxic-appearing.     Comments: Appears comfortable  Pulmonary:     Effort: Pulmonary effort is normal. No respiratory distress.  Skin:    General: Skin is warm and dry.  Neurological:     Mental Status: She is unresponsive.             Vital Signs: BP (!) 160/60   Pulse (!) 45   Temp 98.5 F (36.9 C) (Axillary)   Resp (!) 6   Ht 5' 4 (1.626 m)   Wt 78.7 kg   SpO2 (!) 87%   BMI 29.78 kg/m  SpO2: SpO2: (!) 87 % O2 Device:  O2 Device: Room Air O2 Flow Rate: O2 Flow Rate (L/min): 2 L/min      Palliative Assessment/Data:10%   Palliative Care Assessment & Plan   Patient Profile/Assessment:  78 y.o. female admitted on 03/01/2024 with altered mental status. Patient ultimately diagnosed with sepsis secondary to UTI with associated acute metabolic encephalopathy.  Also noted to have hypervolemia secondary to CKD stage V.  Noted to have AKI and oliguria.  Her dementia is quite advanced. Patient has had recurrent hospitalizations (4 hospitalizations including this 1 during the past 6 months).  Despite aggressive treatment with IV diuresis and IV antibiotics, patient continued to decline.  Kidneys appear to have completely failed and she appears to be dying.  Long goals of care discussion completed on 03/03/2024.  Decision was made to transition to comfort care only with plans to pursue inpatient hospice if health stabilizes enough for discharge.  Extensive family meeting held on 03/04/2024 to address some family concerns.  Ultimately, after speaking with family yesterday and today, plan continues to be to continue comfort care only and discharge to inpatient hospice.  Inpatient hospice placed to admit to facility once bed is available.  Recommendations/Plan:  Continue DNR/DNI/comfort care Continue current symptom regimen Appreciate ongoing involvement of spiritual care services Palliative team to continue to follow for coordination of care, disposition planning, and symptom management  Discharge to inpatient hospice   Symptom management:  Continue Tylenol  suppositories as needed Continue Robinul  as needed Continue hydromorphone  0.5 mg every 2 hours as needed Continue Ativan  1 mg IV every 4 hours as needed Continue Zofran  every 6 hours as needed  Prognosis:  < 2 weeks    Discharge Planning: Hospice facility    Detailed review of medical records (labs, imaging, vital signs), medically appropriate exam,  discussed with treatment team, counseling and education to patient, family, & staff, documenting clinical information, medication management, coordination of care    Billing based on MDM: High  Problems Addressed: One acute or chronic illness or injury that poses a threat to life or bodily function  Amount and/or Complexity of Data: Category 1:Assessment requiring an independent historian(s) and Category 3:Discussion of management or test interpretation with external physician/other qualified health care professional/appropriate source (not separately reported)  Risks: Parenteral controlled substances  Laymon CHRISTELLA Pinal, NP  Palliative Medicine Team Team phone # [phone]  Thank you for allowing the Palliative Medicine Team to assist in the care of this patient. Please utilize secure chat with additional questions, if there is no response within 30 minutes please call the above phone number.  Palliative Medicine Team providers are available by phone from 7am to 7pm daily and can be reached through the team cell phone.  Should this patient require assistance outside of these hours, please call the patient's attending physician.

## 2024-03-06 DIAGNOSIS — N390 Urinary tract infection, site not specified: Secondary | ICD-10-CM | POA: Diagnosis not present

## 2024-03-06 DIAGNOSIS — A419 Sepsis, unspecified organism: Secondary | ICD-10-CM | POA: Diagnosis not present

## 2024-03-06 DIAGNOSIS — N185 Chronic kidney disease, stage 5: Secondary | ICD-10-CM | POA: Diagnosis not present

## 2024-03-06 DIAGNOSIS — Z7189 Other specified counseling: Secondary | ICD-10-CM | POA: Diagnosis not present

## 2024-03-06 DIAGNOSIS — Z515 Encounter for palliative care: Secondary | ICD-10-CM | POA: Diagnosis not present

## 2024-03-06 DIAGNOSIS — Z558 Other problems related to education and literacy: Secondary | ICD-10-CM | POA: Diagnosis not present

## 2024-03-06 LAB — CULTURE, BLOOD (ROUTINE X 2)
Culture: NO GROWTH
Culture: NO GROWTH
Special Requests: ADEQUATE
Special Requests: ADEQUATE

## 2024-03-06 NOTE — Progress Notes (Signed)
 Daily Progress Note   Patient Name: Maria Parrish       Date: 03/06/2024 DOB: 15-Sep-1945  Age: 78 y.o. MRN#: [id number] Attending Physician: Bryn Bernardino NOVAK, MD Primary Care Physician: Margarete Maeola DASEN, FNP Admit Date: 03/01/2024  Reason for Consultation/Follow-up: Disposition  Subjective:   78 y.o. female  with pertinent medical history of dementia, diabetes, heart failure with improved ejection fraction, hyperlipidemia, hyperparathyroidism, hypertension, peripheral neuropathy, moderate protein calorie malnutrition admitted on 03/01/2024 with altered mental status.    Patient ultimately diagnosed with sepsis secondary to UTI with associated acute metabolic encephalopathy.  Also noted to have hypervolemia secondary to CKD stage V.  Noted to have AKI and oliguria.  Her dementia is quite advanced.   Patient has had recurrent hospitalizations (4 hospitalizations including this 1 during the past 6 months).   03/03/2024: Extensive goals of care and anticipatory care plan discussion held at patient's bedside with sons Cathlyn and [employer].  Discussed patient's acute on chronic illness and what it means in the context of her overall health and prognosis.  Also discussed implications of her progressive kidney failure and that given where she is with her health, treatment options are extremely limited.  Ultimately, decision was made to transition patient to comfort care only.   03/04/2024: Met with family at bedside.  They had numerous concerns and questions.  Through a collaborative effort with myself, attending physician, bedside nursing team, and chaplain services we were able to answer all of the family's questions and provide education and support.  Ultimately, decision was made to continue with full comfort measures and plan to discharge to hospice.    03/05/2024: Checked on son and other family members at bedside.  Provided education about end-of-life symptoms.  Confirmed plan is to continue comfort measures and discharge to inpatient hospice.  Today, vital signs reviewed.  Intermittent hypoxia, bradypnea, and bradycardia noted consistent with her end-of-life status.  Medication administration record reviewed.  Total of 2.5 mg IV Dilaudid  given over past 24 hours.  Total of 4 mg of IV Ativan  given on 24-hour look back.  Spoke extensively with son Rozelle at the bedside.  Discussed any concerns he had including questions about urine output and provided education about end-stage renal disease.  Spoke at length regarding end-of-life symptoms and what to expect as patient progresses further through the dying process.  Provided education regarding comfort meds.  Provided education regarding hospice services.  Extensively reviewed advanced directives and completed MOST form with son.  Patient is not responsive therefore, independent history obtained from son and nursing staff.  Son states that patient has  appeared calm and comfortable.  He was able to ask her if she had any pain and she denied pain.  For nursing staff patient appears comfortable at this time but did have some anxiety/agitation last night and attempted to get out of bed.  Current symptom meds appear effective.   I completed a MOST form today. The patient and family outlined their wishes for the following treatment decisions:  Cardiopulmonary Resuscitation: Do Not Attempt Resuscitation (DNR/No CPR)  Medical Interventions: Comfort Measures: Keep clean, warm, and dry. Use medication by any route, positioning, wound care, and other measures to relieve pain and suffering. Use oxygen , suction and manual treatment of airway obstruction as needed for comfort. Do not transfer to the hospital unless comfort needs cannot be met in current location.  Antibiotics: Determine use of limitation of  antibiotics when infection occurs  IV Fluids: No IV fluids (provide other measures to ensure comfort)  Feeding Tube: No feeding tube     Chart review/care coordination:  Completed extensive chart review including EPIC notes, medication administration record, vital signs. Coordinated care directly with bedside nursing, [facility], outpatient hospice staff, and attending physician.   Length of Stay: 5   Physical Exam Constitutional:      General: She is not in acute distress.    Comments: Appears calm and comfortable  Pulmonary:     Effort: Pulmonary effort is normal. Bradypnea present. No respiratory distress.     Comments: Faint crackles noted to bilateral bases however, patient has poor respiratory effort Musculoskeletal:     Right lower leg: Edema present.     Left lower leg: Edema present.  Skin:    General: Skin is warm and dry.  Neurological:     Mental Status: She is unresponsive.             Vital Signs: BP (!) 184/81   Pulse (!) 45   Temp 98.5 F (36.9 C) (Axillary)   Resp 15   Ht 5' 4 (1.626 m)   Wt 78.7 kg   SpO2 90%   BMI 29.78 kg/m  SpO2: SpO2: 90 % O2 Device: O2 Device: Room Air O2 Flow Rate: O2 Flow Rate (L/min): 2 L/min      Palliative Assessment/Data: 10%   Palliative Care Assessment & Plan   Patient Profile/Assessment:  78 y.o. female admitted on 03/01/2024 with altered mental status. Patient ultimately diagnosed with sepsis secondary to UTI with associated acute metabolic encephalopathy.  Also noted to have hypervolemia secondary to CKD stage V.  Noted to have AKI and oliguria.  Her dementia is quite advanced. Patient has had recurrent hospitalizations (4 hospitalizations including this 1 during the past 6 months).  Despite aggressive treatment with IV diuresis and IV antibiotics, patient continued to decline.  Kidneys appear to have completely failed and she appears to be dying.  Long goals of care discussion completed on 03/03/2024.  Decision was made to  transition to comfort care only with plans to pursue inpatient hospice if health stabilizes enough for discharge.  Met with patient and family to provide reassurance on both 03/04/2024 and 03/05/2024.  Plan remains continuing comfort care and discharged to inpatient hospice.  Inpatient hospice staff at bedside today and states they have a bed ready and will admit her to [place] [facility] today.  Recommendations/Plan:  Continue DNR/DNI/comfort care Continue current symptom regimen as it appears effective Discharge to inpatient hospice    Symptom management:  Continue Tylenol  suppositories as needed Continue Robinul  as needed Continue hydromorphone  0.5  mg every 2 hours as needed Continue Ativan  1 mg IV every 4 hours as needed Continue Zofran  every 6 hours as needed  Prognosis:  < 2 weeks    Discharge Planning: Hospice facility    Detailed review of medical records (labs, imaging, vital signs), medically appropriate exam, discussed with treatment team, counseling and education to patient, family, & staff, documenting clinical information, medication management, coordination of care   Billing based on MDM: High  Problems Addressed: One acute or chronic illness or injury that poses a threat to life or bodily function  Amount and/or Complexity of Data: Category 1:Assessment requiring an independent historian(s) and Category 3:Discussion of management or test interpretation with external physician/other qualified health care professional/appropriate source (not separately reported)  Risks: Parenteral controlled substances         Laymon CHRISTELLA Pinal, NP  Palliative Medicine Team Team phone # [phone]  Thank you for allowing the Palliative Medicine Team to assist in the care of this patient. Please utilize secure chat with additional questions, if there is no response within 30 minutes please call the above phone number.  Palliative Medicine Team providers are available by phone from  7am to 7pm daily and can be reached through the team cell phone.  Should this patient require assistance outside of these hours, please call the patient's attending physician.

## 2024-03-06 NOTE — TOC Transition Note (Addendum)
 Transition of Care [facility]) - Discharge Note   Patient Details  Name: Maria Parrish MRN: [id number] Date of Birth: October 04, 1945  [facility] CM/SW Contact:  Sharlyne Stabs, RN Phone Number: 03/06/2024, 9:57 AM   Clinical Narrative:   [facility] has a bed today. Family wanting patient moved to the [facility]. Team updated. Hospice RN on the way to see the patient and talk with family. TOC follow to set up transport.    Addendum:  Clinicals sent, RN called report, EMS scheduled.    Final next level of care: Hospice Medical Facility Barriers to Discharge: Barriers Resolved   Patient Goals and CMS Choice Patient states their goals for this hospitalization and ongoing recovery are:: Agreeable to Hospice CMS Medicare.gov Compare Post Acute Care list provided to:: Patient Represenative (must comment) Choice offered to / list presented to : Adult Children [place] ownership interest in [facility].provided to:: Adult Children    Discharge Placement                    Patient and family notified of of transfer: 03/06/24  Discharge Plan and Services Additional resources added to the After Visit Summary for   In-house Referral: Clinical Social Work Discharge Planning Services: CM Consult                  Social Drivers of Health (SDOH) Interventions SDOH Screenings   Food Insecurity: No Food Insecurity (03/02/2024)  Housing: Low Risk  (03/02/2024)  Transportation Needs: No Transportation Needs (03/02/2024)  Utilities: Not At Risk (03/02/2024)  Depression (PHQ2-9): Low Risk  (09/14/2023)  Physical Activity: Inactive (08/20/2020)  Social Connections: Patient Unable To Answer (03/03/2024)  Tobacco Use: Low Risk  (03/01/2024)     Readmission Risk Interventions    03/03/2024    1:03 PM 03/02/2024    6:03 PM 01/29/2024   12:50 PM  Readmission Risk Prevention Plan  Transportation Screening  Complete Complete  PCP or Specialist Appt within 3-5 Days  Not  Complete   HRI or Home Care Consult  Complete Complete  Social Work Consult for Recovery Care Planning/Counseling  Complete Complete  Palliative Care Screening  Complete Not Applicable  Medication Review [employer])  Complete Complete  HRI or Home Care Consult Complete    SW Recovery Care/Counseling Consult Complete    Palliative Care Screening Complete    Skilled Nursing Facility Not Applicable

## 2024-03-06 NOTE — Progress Notes (Signed)
 Call bell went off from pt.'s room. Upon entering, pt.'s son asked when pt. Had received last dosage or PRN pain medications. Nurse explained that pt. Received medication at 0501. Son requests nurse to not give any more of that medication that he will stay at bedside with her. Nurse explained the rationale for giving medication along with NT and CN at bedside. Pt.'s son verbalized understanding, however expresses desire for nursing staff not to give medication. Nurse explained that pt. Was exhibiting signs of labored breathing, increased WOB, SOB, and pt. Was agitated and restless. Son nodded head and sat down in the chair. Nurse informed oncoming shift charge nurse and will inform dayshift primary nurse.

## 2024-03-06 NOTE — Progress Notes (Signed)
   03/04/24 1052  Spiritual Encounters  Type of Visit Follow up  Care provided to: Pt and family  Conversation partners present during encounter Physician;Nurse  Referral source IDT Rounds  Reason for visit Goals of care meeting  OnCall Visit No  Spiritual Framework  Presenting Themes Meaning/purpose/sources of inspiration;Impactful experiences and emotions;Courage hope and growth;Goals in life/care;Significant life change  Community/Connection Family  Patient Stress Factors None identified  Family Stress Factors Loss;Major life changes  Interventions  Spiritual Care Interventions Made Compassionate presence;Reflective listening;Normalization of emotions;Explored ethical dilemma;Decision-making support/facilitation;Prayer;Bereavement/grief support;Explored values/beliefs/practices/strengths;Narrative/life review;Encouragement;Supported grief process  Intervention Outcomes  Outcomes Connection to spiritual care;Awareness of support;Autonomy/agency;Patient family open to resources  Spiritual Care Plan  Spiritual Care Issues Still Outstanding Maria Parrish will continue to follow   Attended goals of care meeting alongside Dr. Bryn and Laymon Pinal, NP in order to answer questions and support decision making with patients sons and family present. Chaplain supported in facilitation of decision making regarding continuing comfort measures for Maria Parrish and provided space for son Maria Parrish to explore his own hesitations regarding care for his mom. Supported his grief process and provided space for him to engage in reflection around his mothers' life and illness story and his own relationship with the Holy in order to assist him in his grief process today. Provided prayer bedside.  Maria Parrish and Maria Parrish decided for full comfort measures. For continuity of care reasons, patient is to remain in ICU per Dr. Bryn.  Will continue to follow in order to provide spiritual support and to assess for spiritual need.    Ether Blush, M.Div Chaplain

## 2024-03-06 NOTE — Plan of Care (Signed)
  Problem: Fluid Volume: Goal: Hemodynamic stability will improve 03/06/2024 0318 by Arlon Izetta SAILOR, RN Outcome: Progressing 03/05/2024 2020 by Arlon Izetta SAILOR, RN Outcome: Progressing   Problem: Clinical Measurements: Goal: Diagnostic test results will improve 03/06/2024 0318 by Arlon Izetta SAILOR, RN Outcome: Progressing 03/05/2024 2020 by Arlon Izetta SAILOR, RN Outcome: Progressing Goal: Signs and symptoms of infection will decrease 03/06/2024 0318 by Arlon Izetta SAILOR, RN Outcome: Progressing 03/05/2024 2020 by Arlon Izetta SAILOR, RN Outcome: Progressing   Problem: Respiratory: Goal: Ability to maintain adequate ventilation will improve 03/06/2024 0318 by Arlon Izetta SAILOR, RN Outcome: Progressing 03/05/2024 2020 by Arlon Izetta SAILOR, RN Outcome: Progressing

## 2024-03-06 NOTE — Progress Notes (Signed)
 Report called to Jon at hospice house. Son at bedside and aware of transport. Patient appears comfortable at this time.

## 2024-03-06 NOTE — Discharge Summary (Signed)
 Physician Discharge Summary   Patient: Maria Parrish MRN: [id number] DOB: 04-13-1946  Admit date:     03/01/2024  Discharge date: 03/06/24  Discharge Physician: Maria Parrish   PCP: Maria Maeola DASEN, FNP   Recommendations at discharge:   Hospice symptom management  Discharge Diagnoses: Principal Problem:   Sepsis secondary to UTI [facility]) Active Problems:   Acute on chronic diastolic CHF (congestive heart failure) (HCC)   Essential hypertension   Chronic kidney disease, stage 5 (HCC)   Type 2 diabetes mellitus with hyperlipidemia (HCC)   Dementia (HCC)   GERD without esophagitis   Malnutrition of moderate degree [facility])  Hospital Course: 78 yo female with the past medical history of dementia, T2DM, CKD, hypertension, heart failure and dyslipidemia who presented with altered mental status.  Recent hospitalization 07/14 to 02/02/24 for pneumonia, complicated with acute renal failure and encephalopathy. She was discharged to SNF, to continue with palliative/ hospice care due to progressive renal failure.  She was noted to have change in her mentation, to the point where she was not interactive, EMS was called and patient was brought to the hospital.  On her initial physical examination her blood pressure was 170/75, HR 59, RR 32 and 02 saturation 96% Lungs with decreased breath sounds bilaterally with rales at the left base, no wheezing or rhonchi, heart with S1 and S2 present and regular, abdomen not distended and not tender, no lower extremity edema.    Na 143, K 3.1 CL 110, bicarbonate 23, glucose 217, bun 59 cr 5.0  Mg 2.3  AST 53 ALT 60  BNP 352  High sensitive troponin 3  Lactic acid 0,6  Wbc 4.9 hgb 10.3 plt 208  TSH 2,5    Urine analysis SG 1.017, protein > 300, large leukocytes, small hgb, > 500 glucose, wbc >50    CT with no acute intracranial abnormality.  Extensive chronic small vessel ischemic disease.  Unchanged ventriculomegaly which may reflect central predominant  cerebral atrophy or normal pressure hydrocephalus.    Chest radiograph with left rotation, mild cardiomegaly, bilateral hilar vascular congestion, small left pleural effusion with atelectasis.    EKG 38 bpm, normal axis, qtc 532, sinus rhythm with low voltage, no significant ST segment or T wave changes.    08/ 18 patient with no meaningful improvement , after IV antibiotics and IV diuresis.  Continue to have very poor prognosis in the setting of advanced dementia, patient has been not responsive, not able to eat.  Palliative consulted and patient's family has decided to continue care under comfort care.   Assessment and Plan: Sepsis secondary to UTI (HCC) Severe sepsis (present on admission)  Acute metabolic encephalopathy    Patient with no significant improvement, continue with rapid decline.  She has been transitioned to comfort care.    Acute on chronic diastolic CHF (congestive heart failure) (HCC) 09/2023 echocardiogram with preserved LV systolic function EF 55 to 60%, mild LVH, RV systolic function preserved, RVSP 37.6 mmHg, LA with mild dilatation, mitral valve with mild regurgitation,    Positive signs of hypervolemia  Not responding to high doses of furosemide  IV    Acute hypoxemic respiratory failure due to acute pulmonary edema,  Transitioned to comfort care, suggest avoidance of supplemental oxygen , treating dyspnea with medications. Discussed at length with family and palliative NP.    Terminal agitation: Continue active symptom management, suspect would be appropriate for inpatient hospice Tx. Evaluation requested today.    Essential hypertension Patient not tolerating po  meds due to altered cognition.  Transitioned to comfort care    Chronic kidney disease, stage 5 (HCC) AKI, oliguric.    Patient not candidate for renal replacement therapy.  Very poor prognosis.  Transitioned to comfort care    Type 2 diabetes mellitus with hyperlipidemia [facility]) Patient with  no po intake.  Hold on insulin  therapy, transitioned to comfort care    Dementia [facility]) Advance dementia  Continue supportive care Poor prognosis    GERD without esophagitis Transitioned to comfort care    Malnutrition of moderate degree (HCC) Very poor prognosis  Not able to take po due to cognitive impairment.   Consultants: Palliative care Procedures performed: None  Disposition: Residential hospice Diet recommendation: Per pt/family preference for comfort, risks aspiration DISCHARGE MEDICATION: Allergies as of 03/06/2024       Reactions   Statins Other (See Comments)   Makes wrists and fingers ache Muscle Pain and Joint Pain   Penicillins Other (See Comments)   Makes patient sore.        Medication List     STOP taking these medications    albuterol  108 (90 Base) MCG/ACT inhaler Commonly known as: VENTOLIN  HFA   amLODipine  10 MG tablet Commonly known as: NORVASC    carvedilol  12.5 MG tablet Commonly known as: COREG    epoetin  alfa-epbx 4000 UNIT/ML injection Commonly known as: RETACRIT    ezetimibe  10 MG tablet Commonly known as: ZETIA    ferrous sulfate  325 (65 FE) MG tablet   hydrALAZINE  100 MG tablet Commonly known as: APRESOLINE    insulin  aspart 100 UNIT/ML injection Commonly known as: novoLOG    isosorbide  mononitrate 30 MG 24 hr tablet Commonly known as: IMDUR    omeprazole  20 MG tablet Commonly known as: PRILOSEC  OTC   sodium bicarbonate  650 MG tablet   torsemide  20 MG tablet Commonly known as: DEMADEX    VITAMIN B-12 PO        Contact information for after-discharge care     Destination     [facility] .   Service: Inpatient Hospice Contact information: [address] [place]  [address] [phone]                    Discharge Exam: Maria Parrish   03/01/24 2255  Weight: 78.7 kg  Very drowsy elderly female in no distress  Condition at discharge: Stable for transfer, prognosis in  days.  The results of significant diagnostics from this hospitalization (including imaging, microbiology, ancillary and laboratory) are listed below for reference.   Imaging Studies: DG Chest Port 1 View Result Date: 03/01/2024 CLINICAL DATA:  Weakness, bradycardia EXAM: PORTABLE CHEST 1 VIEW COMPARISON:  01/28/2024 FINDINGS: Cardiomegaly, vascular congestion. Moderate left pleural effusion with left lower lobe atelectasis or infiltrate. No overt edema. No acute bony abnormality. IMPRESSION: Cardiomegaly with vascular congestion. Left lower lobe atelectasis or infiltrate with moderate left effusion. Electronically Signed   By: Franky Crease M.D.   On: 03/01/2024 20:00   CT Head Wo Contrast Result Date: 03/01/2024 CLINICAL DATA:  Altered mental status. EXAM: CT HEAD WITHOUT CONTRAST TECHNIQUE: Contiguous axial images were obtained from the base of the skull through the vertex without intravenous contrast. RADIATION DOSE REDUCTION: This exam was performed according to the departmental dose-optimization program which includes automated exposure control, adjustment of the mA and/or kV according to patient size and/or use of iterative reconstruction technique. COMPARISON:  Head CT 01/28/2024 and MRI 09/22/2015 FINDINGS: Brain: There is no evidence of an acute infarct, intracranial hemorrhage,  mass, midline shift, or extra-axial fluid collection. Confluent cerebral white matter hypodensities are similar to the prior CT and are nonspecific but compatible with extensive chronic small vessel ischemic disease. Ventriculomegaly is unchanged and out of proportion to the cerebral sulci which are only mildly enlarged, and there is an acute callosal angle. Vascular: Calcified atherosclerosis at the skull base. No hyperdense vessel. Skull: No acute fracture or suspicious lesion. Sinuses/Orbits: Visualized paranasal sinuses and mastoid air cells are clear. Unremarkable included orbits. Other: None. IMPRESSION: 1. No evidence  of acute intracranial abnormality. 2. Extensive chronic small vessel ischemic disease. 3. Unchanged ventriculomegaly which may reflect central predominant cerebral atrophy or normal pressure hydrocephalus in the appropriate clinical setting. Electronically Signed   By: Dasie Hamburg M.D.   On: 03/01/2024 19:37    Microbiology: Results for orders placed or performed during the hospital encounter of 03/01/24  Culture, blood (routine x 2)     Status: None   Collection Time: 03/01/24  6:55 PM   Specimen: Right Antecubital; Blood  Result Value Ref Range Status   Specimen Description RIGHT ANTECUBITAL  Final   Special Requests   Final    BOTTLES DRAWN AEROBIC AND ANAEROBIC Blood Culture adequate volume   Culture   Final    NO GROWTH 5 DAYS Performed at [facility], [address], [place], [place] [address]    Report Status 03/06/2024 FINAL  Final  Culture, blood (routine x 2)     Status: None   Collection Time: 03/01/24  7:56 PM   Specimen: BLOOD RIGHT WRIST  Result Value Ref Range Status   Specimen Description BLOOD RIGHT WRIST  Final   Special Requests   Final    BOTTLES DRAWN AEROBIC AND ANAEROBIC Blood Culture adequate volume   Culture   Final    NO GROWTH 5 DAYS Performed at [facility], [address], [place], [place] [address]    Report Status 03/06/2024 FINAL  Final  Urine Culture     Status: Abnormal   Collection Time: 03/01/24  8:28 PM   Specimen: Urine, Clean Catch  Result Value Ref Range Status   Specimen Description   Final    URINE, CLEAN CATCH Performed at [facility], [address], [place], [place] [address]    Special Requests   Final    NONE Performed at [facility], [address] [place], [place] [address]    Culture (A)  Final    >=100,000 COLONIES/mL KLEBSIELLA PNEUMONIAE 20,000 COLONIES/mL PROTEUS MIRABILIS    Report Status 03/04/2024 FINAL  Final   Organism ID, Bacteria KLEBSIELLA PNEUMONIAE (A)  Final   Organism ID, Bacteria PROTEUS MIRABILIS (A)   Final      Susceptibility   Klebsiella pneumoniae - MIC*    AMPICILLIN RESISTANT Resistant     CEFAZOLIN (URINE) Value in next row Sensitive      2 SENSITIVEThis is a modified FDA-approved test that has been validated and its performance characteristics determined by the reporting laboratory.  This laboratory is certified under the Clinical Laboratory Improvement Amendments CLIA as qualified to perform high complexity clinical laboratory testing.    CEFEPIME  Value in next row Sensitive      2 SENSITIVEThis is a modified FDA-approved test that has been validated and its performance characteristics determined by the reporting laboratory.  This laboratory is certified under the Clinical Laboratory Improvement Amendments CLIA as qualified to perform high complexity clinical laboratory testing.    ERTAPENEM Value in next row Sensitive  2 SENSITIVEThis is a modified FDA-approved test that has been validated and its performance characteristics determined by the reporting laboratory.  This laboratory is certified under the Clinical Laboratory Improvement Amendments CLIA as qualified to perform high complexity clinical laboratory testing.    CEFTRIAXONE  Value in next row Sensitive      2 SENSITIVEThis is a modified FDA-approved test that has been validated and its performance characteristics determined by the reporting laboratory.  This laboratory is certified under the Clinical Laboratory Improvement Amendments CLIA as qualified to perform high complexity clinical laboratory testing.    CIPROFLOXACIN  Value in next row Sensitive      2 SENSITIVEThis is a modified FDA-approved test that has been validated and its performance characteristics determined by the reporting laboratory.  This laboratory is certified under the Clinical Laboratory Improvement Amendments CLIA as qualified to perform high complexity clinical laboratory testing.    GENTAMICIN Value in next row Sensitive      2 SENSITIVEThis is a  modified FDA-approved test that has been validated and its performance characteristics determined by the reporting laboratory.  This laboratory is certified under the Clinical Laboratory Improvement Amendments CLIA as qualified to perform high complexity clinical laboratory testing.    NITROFURANTOIN Value in next row Sensitive      2 SENSITIVEThis is a modified FDA-approved test that has been validated and its performance characteristics determined by the reporting laboratory.  This laboratory is certified under the Clinical Laboratory Improvement Amendments CLIA as qualified to perform high complexity clinical laboratory testing.    TRIMETH/SULFA Value in next row Sensitive      2 SENSITIVEThis is a modified FDA-approved test that has been validated and its performance characteristics determined by the reporting laboratory.  This laboratory is certified under the Clinical Laboratory Improvement Amendments CLIA as qualified to perform high complexity clinical laboratory testing.    AMPICILLIN/SULBACTAM Value in next row Sensitive      2 SENSITIVEThis is a modified FDA-approved test that has been validated and its performance characteristics determined by the reporting laboratory.  This laboratory is certified under the Clinical Laboratory Improvement Amendments CLIA as qualified to perform high complexity clinical laboratory testing.    PIP/TAZO Value in next row Sensitive ug/mL     <=4 SENSITIVEThis is a modified FDA-approved test that has been validated and its performance characteristics determined by the reporting laboratory.  This laboratory is certified under the Clinical Laboratory Improvement Amendments CLIA as qualified to perform high complexity clinical laboratory testing.    MEROPENEM Value in next row Sensitive      <=4 SENSITIVEThis is a modified FDA-approved test that has been validated and its performance characteristics determined by the reporting laboratory.  This laboratory is certified  under the Clinical Laboratory Improvement Amendments CLIA as qualified to perform high complexity clinical laboratory testing.    * >=100,000 COLONIES/mL KLEBSIELLA PNEUMONIAE   Proteus mirabilis - MIC*    AMPICILLIN Value in next row Sensitive      <=4 SENSITIVEThis is a modified FDA-approved test that has been validated and its performance characteristics determined by the reporting laboratory.  This laboratory is certified under the Clinical Laboratory Improvement Amendments CLIA as qualified to perform high complexity clinical laboratory testing.    CEFAZOLIN (URINE) Value in next row Sensitive      4 SENSITIVEThis is a modified FDA-approved test that has been validated and its performance characteristics determined by the reporting laboratory.  This laboratory is certified under the Clinical Laboratory Improvement Amendments CLIA as  qualified to perform high complexity clinical laboratory testing.    CEFEPIME  Value in next row Sensitive      4 SENSITIVEThis is a modified FDA-approved test that has been validated and its performance characteristics determined by the reporting laboratory.  This laboratory is certified under the Clinical Laboratory Improvement Amendments CLIA as qualified to perform high complexity clinical laboratory testing.    ERTAPENEM Value in next row Sensitive      4 SENSITIVEThis is a modified FDA-approved test that has been validated and its performance characteristics determined by the reporting laboratory.  This laboratory is certified under the Clinical Laboratory Improvement Amendments CLIA as qualified to perform high complexity clinical laboratory testing.    CEFTRIAXONE  Value in next row Sensitive      4 SENSITIVEThis is a modified FDA-approved test that has been validated and its performance characteristics determined by the reporting laboratory.  This laboratory is certified under the Clinical Laboratory Improvement Amendments CLIA as qualified to perform high  complexity clinical laboratory testing.    CIPROFLOXACIN  Value in next row Sensitive      4 SENSITIVEThis is a modified FDA-approved test that has been validated and its performance characteristics determined by the reporting laboratory.  This laboratory is certified under the Clinical Laboratory Improvement Amendments CLIA as qualified to perform high complexity clinical laboratory testing.    GENTAMICIN Value in next row Sensitive      4 SENSITIVEThis is a modified FDA-approved test that has been validated and its performance characteristics determined by the reporting laboratory.  This laboratory is certified under the Clinical Laboratory Improvement Amendments CLIA as qualified to perform high complexity clinical laboratory testing.    NITROFURANTOIN Value in next row Resistant      4 SENSITIVEThis is a modified FDA-approved test that has been validated and its performance characteristics determined by the reporting laboratory.  This laboratory is certified under the Clinical Laboratory Improvement Amendments CLIA as qualified to perform high complexity clinical laboratory testing.    TRIMETH/SULFA Value in next row Sensitive      4 SENSITIVEThis is a modified FDA-approved test that has been validated and its performance characteristics determined by the reporting laboratory.  This laboratory is certified under the Clinical Laboratory Improvement Amendments CLIA as qualified to perform high complexity clinical laboratory testing.    AMPICILLIN/SULBACTAM Value in next row Sensitive      4 SENSITIVEThis is a modified FDA-approved test that has been validated and its performance characteristics determined by the reporting laboratory.  This laboratory is certified under the Clinical Laboratory Improvement Amendments CLIA as qualified to perform high complexity clinical laboratory testing.    PIP/TAZO Value in next row Sensitive ug/mL     <=4 SENSITIVEThis is a modified FDA-approved test that has been  validated and its performance characteristics determined by the reporting laboratory.  This laboratory is certified under the Clinical Laboratory Improvement Amendments CLIA as qualified to perform high complexity clinical laboratory testing.    MEROPENEM Value in next row Sensitive      <=4 SENSITIVEThis is a modified FDA-approved test that has been validated and its performance characteristics determined by the reporting laboratory.  This laboratory is certified under the Clinical Laboratory Improvement Amendments CLIA as qualified to perform high complexity clinical laboratory testing.    * 20,000 COLONIES/mL PROTEUS MIRABILIS  MRSA Next Gen by PCR, Nasal     Status: None   Collection Time: 03/01/24 10:45 PM   Specimen: Nasal Mucosa; Nasal Swab  Result Value Ref Range  Status   MRSA by PCR Next Gen NOT DETECTED NOT DETECTED Final    Comment: (NOTE) The GeneXpert MRSA Assay (FDA approved for NASAL specimens only), is one component of a comprehensive MRSA colonization surveillance program. It is not intended to diagnose MRSA infection nor to guide or monitor treatment for MRSA infections. Test performance is not FDA approved in patients less than 74 years old. Performed at [facility], [address] [place], [place] [address]     Labs: CBC: Recent Labs  Lab 03/01/24 1855 03/02/24 0545 03/03/24 0508  WBC 4.9 6.7 5.6  NEUTROABS 4.4  --   --   HGB 10.3* 10.0* 10.0*  HCT 30.9* 30.2* 32.3*  MCV 92.0 92.4 96.7  PLT 208 246 214   Basic Metabolic Panel: Recent Labs  Lab 03/01/24 1855 03/01/24 1909 03/02/24 0545 03/03/24 0508  NA 143  --  144 145  K 3.1*  --  3.8 3.8  CL 110  --  113* 112*  CO2 23  --  23 24  GLUCOSE 217*  --  109* 89  BUN 59*  --  58* 59*  CREATININE 5.03*  --  5.02* 5.41*  CALCIUM  8.6*  --  8.7* 8.5*  MG  --  2.3  --   --   PHOS  --   --   --  5.7*   Liver Function Tests: Recent Labs  Lab 03/01/24 1855 03/03/24 0508  AST 53*  --   ALT 60*  --    ALKPHOS 62  --   BILITOT <0.2  --   PROT 6.2*  --   ALBUMIN 2.8* 2.7*   CBG: Recent Labs  Lab 03/01/24 2312 03/02/24 0720 03/02/24 1123 03/02/24 1653 03/03/24 0742  GLUCAP 171* 101* 90 99 78    Discharge time spent: greater than 30 minutes.  Signed: [name], MD [facility] 03/06/2024

## 2024-03-10 NOTE — Telephone Encounter (Signed)
 Pharmacy Patient Advocate Encounter  Received notification from [employer] that Prior Authorization for Dexcom G7 sensor has been APPROVED from 07/18/2023 to 07/16/2024   PA #/Case ID/Reference #: [id number]

## 2024-03-17 DEATH — deceased

## 2024-03-26 ENCOUNTER — Encounter: Payer: Self-pay | Admitting: Nephrology

## 2024-03-26 ENCOUNTER — Ambulatory Visit: Admitting: "Endocrinology

## 2024-05-15 ENCOUNTER — Encounter: Payer: Self-pay | Admitting: Nephrology

## 2024-05-22 ENCOUNTER — Ambulatory Visit: Payer: Self-pay | Admitting: Student
